# Patient Record
Sex: Female | Born: 1985 | Race: White | Hispanic: No | Marital: Married | Smoking: Former smoker
Health system: Southern US, Community
[De-identification: ages and names within clinical notes are randomized; demographics above are authoritative.]

## PROBLEM LIST (undated history)

## (undated) ENCOUNTER — Inpatient Hospital Stay: Payer: Self-pay

## (undated) DIAGNOSIS — T4145XA Adverse effect of unspecified anesthetic, initial encounter: Secondary | ICD-10-CM

## (undated) DIAGNOSIS — D259 Leiomyoma of uterus, unspecified: Secondary | ICD-10-CM

## (undated) DIAGNOSIS — N200 Calculus of kidney: Secondary | ICD-10-CM

## (undated) DIAGNOSIS — Z8679 Personal history of other diseases of the circulatory system: Secondary | ICD-10-CM

## (undated) DIAGNOSIS — F419 Anxiety disorder, unspecified: Secondary | ICD-10-CM

## (undated) DIAGNOSIS — Z810 Family history of intellectual disabilities: Secondary | ICD-10-CM

## (undated) DIAGNOSIS — Q614 Renal dysplasia: Secondary | ICD-10-CM

## (undated) DIAGNOSIS — T8859XA Other complications of anesthesia, initial encounter: Secondary | ICD-10-CM

## (undated) DIAGNOSIS — I4711 Inappropriate sinus tachycardia, so stated: Secondary | ICD-10-CM

## (undated) DIAGNOSIS — R000 Tachycardia, unspecified: Secondary | ICD-10-CM

## (undated) DIAGNOSIS — Z8349 Family history of other endocrine, nutritional and metabolic diseases: Secondary | ICD-10-CM

## (undated) DIAGNOSIS — I10 Essential (primary) hypertension: Secondary | ICD-10-CM

## (undated) DIAGNOSIS — K5792 Diverticulitis of intestine, part unspecified, without perforation or abscess without bleeding: Secondary | ICD-10-CM

## (undated) DIAGNOSIS — O24419 Gestational diabetes mellitus in pregnancy, unspecified control: Secondary | ICD-10-CM

## (undated) DIAGNOSIS — K5649 Other impaction of intestine: Secondary | ICD-10-CM

## (undated) DIAGNOSIS — J45909 Unspecified asthma, uncomplicated: Secondary | ICD-10-CM

## (undated) DIAGNOSIS — B019 Varicella without complication: Secondary | ICD-10-CM

## (undated) DIAGNOSIS — F53 Postpartum depression: Secondary | ICD-10-CM

## (undated) DIAGNOSIS — K219 Gastro-esophageal reflux disease without esophagitis: Secondary | ICD-10-CM

## (undated) HISTORY — DX: Family history of other endocrine, nutritional and metabolic diseases: Z83.49

## (undated) HISTORY — DX: Renal dysplasia: Q61.4

## (undated) HISTORY — DX: Leiomyoma of uterus, unspecified: D25.9

## (undated) HISTORY — PX: TYMPANOSTOMY TUBE PLACEMENT: SHX32

## (undated) HISTORY — PX: TONSILLECTOMY AND ADENOIDECTOMY: SUR1326

## (undated) HISTORY — DX: Unspecified asthma, uncomplicated: J45.909

## (undated) HISTORY — DX: Inappropriate sinus tachycardia, so stated: I47.11

## (undated) HISTORY — DX: Postpartum depression: F53.0

## (undated) HISTORY — DX: Personal history of other diseases of the circulatory system: Z86.79

## (undated) HISTORY — DX: Calculus of kidney: N20.0

## (undated) HISTORY — PX: APPENDECTOMY: SHX54

## (undated) HISTORY — DX: Tachycardia, unspecified: R00.0

## (undated) HISTORY — PX: OTHER SURGICAL HISTORY: SHX169

## (undated) HISTORY — DX: Other impaction of intestine: K56.49

## (undated) HISTORY — DX: Diverticulitis of intestine, part unspecified, without perforation or abscess without bleeding: K57.92

## (undated) HISTORY — DX: Gastro-esophageal reflux disease without esophagitis: K21.9

## (undated) HISTORY — DX: Gestational diabetes mellitus in pregnancy, unspecified control: O24.419

## (undated) HISTORY — DX: Family history of intellectual disabilities: Z81.0

## (undated) HISTORY — DX: Varicella without complication: B01.9

## (undated) HISTORY — DX: Essential (primary) hypertension: I10

---

## 2015-09-25 HISTORY — PX: GALLBLADDER SURGERY: SHX652

## 2015-10-25 ENCOUNTER — Encounter: Payer: Self-pay | Admitting: Family Medicine

## 2015-10-25 ENCOUNTER — Ambulatory Visit (INDEPENDENT_AMBULATORY_CARE_PROVIDER_SITE_OTHER): Payer: Self-pay | Admitting: Family Medicine

## 2015-10-25 VITALS — BP 118/88 | HR 117 | Temp 98.7°F | Ht 65.75 in | Wt 260.0 lb

## 2015-10-25 DIAGNOSIS — J209 Acute bronchitis, unspecified: Secondary | ICD-10-CM

## 2015-10-25 MED ORDER — ALBUTEROL SULFATE HFA 108 (90 BASE) MCG/ACT IN AERS: 2.0000 | INHALATION_SPRAY | Freq: Four times a day (QID) | RESPIRATORY_TRACT | Status: DC | PRN

## 2015-10-25 MED ORDER — DOXYCYCLINE HYCLATE 100 MG PO TABS: 100.0000 mg | ORAL_TABLET | Freq: Two times a day (BID) | ORAL | Status: DC

## 2015-10-25 MED ORDER — PREDNISONE 50 MG PO TABS: ORAL_TABLET | ORAL | Status: DC

## 2015-10-25 MED ORDER — HYDROCOD POLST-CPM POLST ER 10-8 MG/5ML PO SUER: 5.0000 mL | Freq: Two times a day (BID) | ORAL | Status: DC | PRN

## 2015-10-25 NOTE — Patient Instructions (Signed)
Take the medications as prescribed.  Follow up annually or sooner if needed.  Take care  Dr. Lacinda Axon

## 2015-10-25 NOTE — Assessment & Plan Note (Signed)
New problem. Patient with severe cough. Given history of asthma and duration of illness, treating with prednisone, Doxy, and Tussionex.

## 2015-10-25 NOTE — Progress Notes (Signed)
Pre visit review using our clinic review tool, if applicable. No additional management support is needed unless otherwise documented below in the visit note. 

## 2015-10-25 NOTE — Progress Notes (Signed)
Subjective:  Patient ID: Jane Jackson, female    DOB: March 13, 1986  Age: 30 y.o. MRN: [id number]  CC: Cough, congestion, chest pain  HPI Jane Jackson is a 30 y.o. female presents to the clinic today with the above complaints.  Patient states that she's been sick for the past 2 weeks. She states that it began with typical URI symptoms. It has now progressed to severe cough. Cough is intermittently productive. Cough is severe and unrelenting. She's been using Mucinex and Sudafed with no improvement. She reports associated shortness of breath & chest discomfort with cough. No exacerbating factors. No other complaints today.  PMH, Surgical Hx, Family Hx, Social History reviewed and updated as below.  Past Medical History  Diagnosis Date  . Chicken pox   . Asthma   . Kidney stones    Past Surgical History  Procedure Laterality Date  . Appendectomy    . Tonsillectomy and adenoidectomy      age 33  . Ruptured cyst    . Tympanostomy tube placement     Family History  Problem Relation Age of Onset  . Arthritis Mother   . Ovarian cancer Mother   . Hypertension Mother   . Diabetes Mother   . Alcohol abuse Father   . Arthritis Father   . Hypertension Father   . Arthritis Maternal Grandmother   . Heart disease Maternal Grandmother   . Stroke Maternal Grandmother   . Hypertension Maternal Grandmother   . Diabetes Maternal Grandmother   . Arthritis Maternal Grandfather   . Colon cancer Maternal Grandfather   . Prostate cancer Maternal Grandfather   . Heart disease Maternal Grandfather   . Stroke Maternal Grandfather   . Hypertension Maternal Grandfather   . Arthritis Paternal Grandmother   . Heart disease Paternal Grandmother   . Hypertension Paternal Grandmother   . Diabetes Paternal Grandmother   . Arthritis Paternal Grandfather   . Heart disease Paternal Grandfather   . Hypertension Paternal Grandfather    Social History  Substance Use Topics  . Smoking status: Current Some  Day Smoker  . Smokeless tobacco: Never Used     Comment: Smokes socially.   . Alcohol Use: 0.0 oz/week    0 Standard drinks or equivalent per week     Comment: 2-3 glasses of wine; ~ 2x/month.   Review of Systems  Constitutional: Positive for fever and fatigue.  Respiratory: Positive for cough and shortness of breath.   Cardiovascular: Positive for chest pain.  Musculoskeletal: Positive for myalgias.  Neurological: Positive for weakness and headaches.  All other systems reviewed and are negative.   Objective:   Today's Vitals: BP 118/88 mmHg  Pulse 117  Temp(Src) 98.7 F (37.1 C) (Oral)  Ht 5' 5.75" (1.67 m)  Wt 260 lb (117.935 kg)  BMI 42.29 kg/m2  SpO2 99%  LMP 10/11/2015 (Within Weeks)  Physical Exam  Constitutional: She is oriented to person, place, and time. She appears well-developed. No distress.  HENT:  Head: Normocephalic and atraumatic.  Oropharynx with erythema and post nasal drip. Normal TM's bilaterally.   Eyes: Conjunctivae are normal.  Neck: Neck supple.  Cardiovascular: Regular rhythm.  Tachycardia present.   Pulmonary/Chest: Effort normal and breath sounds normal. No respiratory distress. She has no wheezes. She has no rales.  Musculoskeletal: Normal range of motion. She exhibits no edema.  Lymphadenopathy:    She has no cervical adenopathy.  Neurological: She is alert and oriented to person, place, and time.  Skin: Skin is  warm and dry. No rash noted.  Psychiatric: She has a normal mood and affect.  Vitals reviewed.  Assessment & Plan:   Problem List Items Addressed This Visit    Acute bronchitis - Primary    New problem. Patient with severe cough. Given history of asthma and duration of illness, treating with prednisone, Doxy, and Tussionex.         Outpatient Encounter Prescriptions as of 10/25/2015  Medication Sig  . albuterol (PROVENTIL HFA;VENTOLIN HFA) 108 (90 Base) MCG/ACT inhaler Inhale 2 puffs into the lungs every 6 (six) hours as  needed for wheezing or shortness of breath.  . chlorpheniramine-HYDROcodone (TUSSIONEX PENNKINETIC ER) 10-8 MG/5ML SUER Take 5 mLs by mouth every 12 (twelve) hours as needed.  . doxycycline (VIBRA-TABS) 100 MG tablet Take 1 tablet (100 mg total) by mouth 2 (two) times daily.  . Multiple Vitamins-Minerals (MULTIVITAMIN ADULT PO) Take by mouth.  . predniSONE (DELTASONE) 50 MG tablet 1 tablet daily for 5 days.  . [DISCONTINUED] albuterol (ACCUNEB) 1.25 MG/3ML nebulizer solution Take 1 ampule by nebulization every 6 (six) hours as needed for wheezing.   No facility-administered encounter medications on file as of 10/25/2015.    Follow-up: Annually or sooner if needed.   [place]

## 2016-01-14 DIAGNOSIS — O99211 Obesity complicating pregnancy, first trimester: Secondary | ICD-10-CM | POA: Insufficient documentation

## 2016-01-14 DIAGNOSIS — O99210 Obesity complicating pregnancy, unspecified trimester: Secondary | ICD-10-CM | POA: Insufficient documentation

## 2016-01-14 DIAGNOSIS — Z6841 Body Mass Index (BMI) 40.0 and over, adult: Secondary | ICD-10-CM

## 2016-01-16 ENCOUNTER — Encounter (INDEPENDENT_AMBULATORY_CARE_PROVIDER_SITE_OTHER): Payer: Self-pay

## 2016-01-16 ENCOUNTER — Ambulatory Visit (INDEPENDENT_AMBULATORY_CARE_PROVIDER_SITE_OTHER): Payer: BLUE CROSS/BLUE SHIELD | Admitting: Family Medicine

## 2016-01-16 ENCOUNTER — Encounter: Payer: Self-pay | Admitting: Family Medicine

## 2016-01-16 ENCOUNTER — Telehealth: Payer: Self-pay

## 2016-01-16 DIAGNOSIS — K30 Functional dyspepsia: Secondary | ICD-10-CM

## 2016-01-16 DIAGNOSIS — R1013 Epigastric pain: Secondary | ICD-10-CM | POA: Diagnosis not present

## 2016-01-16 DIAGNOSIS — R1011 Right upper quadrant pain: Secondary | ICD-10-CM | POA: Insufficient documentation

## 2016-01-16 MED ORDER — PROMETHAZINE HCL 25 MG/ML IJ SOLN
25.0000 mg | Freq: Once | INTRAMUSCULAR | Status: AC
  Administered 2016-01-16: 25 mg via INTRAMUSCULAR

## 2016-01-16 MED ORDER — PROMETHAZINE HCL 25 MG PO TABS: 25.0000 mg | ORAL_TABLET | Freq: Three times a day (TID) | ORAL | Status: DC | PRN

## 2016-01-16 MED ORDER — KETOROLAC TROMETHAMINE 60 MG/2ML IM SOLN
60.0000 mg | Freq: Once | INTRAMUSCULAR | Status: AC
  Administered 2016-01-16: 60 mg via INTRAMUSCULAR

## 2016-01-16 MED ORDER — PROMETHAZINE HCL 25 MG/ML IJ SOLN: 25.0000 mg | Freq: Once | INTRAMUSCULAR | Status: DC

## 2016-01-16 MED ORDER — OXYCODONE-ACETAMINOPHEN 5-325 MG PO TABS: 1.0000 | ORAL_TABLET | Freq: Three times a day (TID) | ORAL | Status: DC | PRN

## 2016-01-16 NOTE — Progress Notes (Signed)
Subjective:  Patient ID: Jane Jackson, female    DOB: 1986/02/19  Age: 30 y.o. MRN: [id number]  CC: Abdominal pain, impaction  HPI:  30 year-old female presents with the above complaints.  Patient was recently admitted and [facility] from 4/3 - 4/21 regarding this. Hospital course/discharge summary was reviewed and is summarized as below: Patient presented with epigastric/right upper quadrant pain. Ultrasound was obtained and revealed gallbladder sludge. No evidence of cholecystitis. Suspect CT revealed mild stranding today; concerning for diverticulitis. She was treated with antibiotics. She continued to have significant pain. Subsequent abdominal x-ray showed a large amount of stool burden. Patient continued to have pain. MRCP was obtained and revealed no acute abnormality is. GI saw the patient during hospitalization. EGD was done and was unremarkable. After significant workup and imaging, was thought that her pain was functional in nature. There is no evidence of obstruction during her admission. She was given enema and oral medications to alleviate the stool burden with no improvement. Patient declined colonoscopy and further bowel regimen/cleanout as increased/continued narcotics would not be given. After dissatisfaction with care she requests leave the hospital and she was discharged. During auscultation, she also had cephalic vein clots due to IV access.  After being discharged from [facility], she continued to feel poorly and went to [facility] for evaluation. CT was obtained and revealed resolving colitis. Per the chart, she was tolerating food and medications and was well-appearing. Labs were normal. She was instructed to follow-up with GI and was discharged in stable condition.  Patient presents today complaining of continued severe right upper quadrant and epigastric pain. States that she's not passing gas. She has had some loose stool. She reports significant nausea and vomiting and states that she is  unable to keep anything down. She reports associated weakness and states that she is almost passed out a few times. No known exacerbating or relieving factors. Recent fever or chills.  Social Hx   Social History   Social History  . Marital Status: Married    Spouse Name: N/A  . Number of Children: N/A  . Years of Education: N/A   Social History Main Topics  . Smoking status: Current Some Day Smoker  . Smokeless tobacco: Never Used     Comment: Smokes socially.   . Alcohol Use: 0.0 oz/week    0 Standard drinks or equivalent per week     Comment: 2-3 glasses of wine; ~ 2x/month.  . Drug Use: No  . Sexual Activity: Yes   Other Topics Concern  . None   Social History Narrative   Review of Systems  Constitutional: Positive for fatigue.  Gastrointestinal: Positive for nausea, vomiting and constipation.  Neurological: Positive for weakness.   Objective:  BP 118/62 mmHg  Pulse 107  Temp(Src) 97.7 F (36.5 C) (Oral)  Ht 5' 5.75" (1.67 m)  Wt 139 lb 4 oz (63.163 kg)  BMI 22.65 kg/m2  SpO2 97%  BP/Weight 01/16/2016 123XX123  Systolic BP 123456 123456  Diastolic BP 62 88  Wt. (Lbs) 139.25 260  BMI 22.65 42.29   Physical Exam  Constitutional:  Patient is in wheelchair due to weakness. Appears in pain.  Eyes: Conjunctivae are normal. No scleral icterus.  Cardiovascular: Regular rhythm.   Pulmonary/Chest: Effort normal.  Abdominal: Soft.  Tender diffusely but more predominantly in the right upper quadrant and epigastric region. No bowel sounds were appreciated.   Neurological: She is alert.  Psychiatric:  Flat affect, depressed mood.  Vitals reviewed.  Assessment & Plan:   Problem List Items Addressed This Visit    Abdominal pain, RUQ    New problem.  Hospitalization, and recent ED visit reviewed and summarized in history of present illness. Recent labs from ED visit on 4/22 reviewed. Patient with likely functional abdominal pain (after review of the medical  records and workup). Complicated by significant opiate use in the hospital and subsequent constipation. Given reports of nausea, vomiting, and inability to keep things down I discussed going back to the hospital. This is limited in a difficult decision as she has recently been admitted at [facility] and was seen and discharged from the [facility] ED on 4/22. I was able to arrange her to see a GI physician tomorrow.  Prescribed Phenergan for her nausea and encouraged aggressive hydration. Toradol was given for pain today. Patient requested opiates today. I reluctantly gave her prescription for Percocet after informing her of the risk especially in the setting of her current bowel issues. She was aware of the risk. I will not be providing additional narcotic medication. Advise her symptoms and other testicular hospital if she worsens.      Relevant Medications   promethazine (PHENERGAN) injection 25 mg (Completed)   ketorolac (TORADOL) injection 60 mg (Completed)   Abdominal pain, epigastric   Relevant Medications   promethazine (PHENERGAN) injection 25 mg (Completed)   ketorolac (TORADOL) injection 60 mg (Completed)      Meds ordered this encounter  Medications  . promethazine (PHENERGAN) injection 25 mg    Sig:   . ketorolac (TORADOL) injection 60 mg    Sig:   . oxyCODONE-acetaminophen (ROXICET) 5-325 MG tablet    Sig: Take 1 tablet by mouth every 8 (eight) hours as needed for severe pain.    Dispense:  20 tablet    Refill:  0  . promethazine (PHENERGAN) 25 MG tablet    Sig: Take 1 tablet (25 mg total) by mouth every 8 (eight) hours as needed for nausea or vomiting.    Dispense:  20 tablet    Refill:  0    Follow-up: Following GI eval.  [place]

## 2016-01-16 NOTE — Assessment & Plan Note (Signed)
New problem.  Hospitalization, and recent ED visit reviewed and summarized in history of present illness. Recent labs from ED visit on 4/22 reviewed. Patient with likely functional abdominal pain (after review of the medical records and workup). Complicated by significant opiate use in the hospital and subsequent constipation. Given reports of nausea, vomiting, and inability to keep things down I discussed going back to the hospital. This is limited in a difficult decision as she has recently been admitted at [facility] and was seen and discharged from the [facility] ED on 4/22. I was able to arrange her to see a GI physician tomorrow.  Prescribed Phenergan for her nausea and encouraged aggressive hydration. Toradol was given for pain today. Patient requested opiates today. I reluctantly gave her prescription for Percocet after informing her of the risk especially in the setting of her current bowel issues. She was aware of the risk. I will not be providing additional narcotic medication. Advise her symptoms and other testicular hospital if she worsens.

## 2016-01-16 NOTE — Patient Instructions (Signed)
If you worsen, go to the [facility] ED.  Be sure to go to your GI appt.  Hydrate, hydrate, hydrate.  Follow up to be scheduled after GI appt.  Take care  Dr. Lacinda Axon

## 2016-01-16 NOTE — Progress Notes (Signed)
Pre visit review using our clinic review tool, if applicable. No additional management support is needed unless otherwise documented below in the visit note. 

## 2016-01-17 ENCOUNTER — Telehealth: Payer: Self-pay | Admitting: Internal Medicine

## 2016-01-17 ENCOUNTER — Ambulatory Visit (INDEPENDENT_AMBULATORY_CARE_PROVIDER_SITE_OTHER): Payer: BLUE CROSS/BLUE SHIELD | Admitting: Gastroenterology

## 2016-01-17 ENCOUNTER — Observation Stay (HOSPITAL_COMMUNITY)
Admission: AD | Admit: 2016-01-17 | Discharge: 2016-01-19 | Disposition: A | Discharge status: Home / Self Care | Payer: BLUE CROSS/BLUE SHIELD | Source: Ambulatory Visit | Attending: Internal Medicine | Admitting: Internal Medicine

## 2016-01-17 ENCOUNTER — Encounter (HOSPITAL_COMMUNITY): Payer: Self-pay | Admitting: *Deleted

## 2016-01-17 ENCOUNTER — Encounter: Payer: Self-pay | Admitting: Gastroenterology

## 2016-01-17 VITALS — BP 110/80 | HR 102 | Ht 65.75 in | Wt 253.0 lb

## 2016-01-17 DIAGNOSIS — F17210 Nicotine dependence, cigarettes, uncomplicated: Secondary | ICD-10-CM | POA: Insufficient documentation

## 2016-01-17 DIAGNOSIS — I808 Phlebitis and thrombophlebitis of other sites: Secondary | ICD-10-CM | POA: Diagnosis not present

## 2016-01-17 DIAGNOSIS — Y848 Other medical procedures as the cause of abnormal reaction of the patient, or of later complication, without mention of misadventure at the time of the procedure: Secondary | ICD-10-CM | POA: Diagnosis not present

## 2016-01-17 DIAGNOSIS — E860 Dehydration: Secondary | ICD-10-CM | POA: Diagnosis not present

## 2016-01-17 DIAGNOSIS — J4520 Mild intermittent asthma, uncomplicated: Secondary | ICD-10-CM | POA: Diagnosis not present

## 2016-01-17 DIAGNOSIS — Z6841 Body Mass Index (BMI) 40.0 and over, adult: Secondary | ICD-10-CM | POA: Diagnosis not present

## 2016-01-17 DIAGNOSIS — Z9049 Acquired absence of other specified parts of digestive tract: Secondary | ICD-10-CM | POA: Insufficient documentation

## 2016-01-17 DIAGNOSIS — R1011 Right upper quadrant pain: Secondary | ICD-10-CM

## 2016-01-17 DIAGNOSIS — K811 Chronic cholecystitis: Principal | ICD-10-CM | POA: Insufficient documentation

## 2016-01-17 DIAGNOSIS — R1115 Cyclical vomiting syndrome unrelated to migraine: Secondary | ICD-10-CM

## 2016-01-17 DIAGNOSIS — G43A1 Cyclical vomiting, intractable: Secondary | ICD-10-CM | POA: Diagnosis not present

## 2016-01-17 DIAGNOSIS — R112 Nausea with vomiting, unspecified: Secondary | ICD-10-CM | POA: Diagnosis not present

## 2016-01-17 DIAGNOSIS — T8172XA Complication of vein following a procedure, not elsewhere classified, initial encounter: Secondary | ICD-10-CM | POA: Diagnosis not present

## 2016-01-17 DIAGNOSIS — J45909 Unspecified asthma, uncomplicated: Secondary | ICD-10-CM | POA: Diagnosis not present

## 2016-01-17 DIAGNOSIS — Z419 Encounter for procedure for purposes other than remedying health state, unspecified: Secondary | ICD-10-CM

## 2016-01-17 HISTORY — DX: Other complications of anesthesia, initial encounter: T88.59XA

## 2016-01-17 HISTORY — DX: Adverse effect of unspecified anesthetic, initial encounter: T41.45XA

## 2016-01-17 HISTORY — DX: Anxiety disorder, unspecified: F41.9

## 2016-01-17 LAB — COMPREHENSIVE METABOLIC PANEL
ALT: 24 U/L (ref 14–54)
AST: 21 U/L (ref 15–41)
Albumin: 4 g/dL (ref 3.5–5.0)
Alkaline Phosphatase: 45 U/L (ref 38–126)
Anion gap: 9 (ref 5–15)
BUN: 12 mg/dL (ref 6–20)
CALCIUM: 9.3 mg/dL (ref 8.9–10.3)
CO2: 25 mmol/L (ref 22–32)
CREATININE: 0.89 mg/dL (ref 0.44–1.00)
Chloride: 106 mmol/L (ref 101–111)
GFR calc non Af Amer: >60 mL/min (ref >60)
Glucose, Bld: 88 mg/dL (ref 65–99)
Potassium: 4.1 mmol/L (ref 3.5–5.1)
SODIUM: 140 mmol/L (ref 135–145)
Total Bilirubin: 0.4 mg/dL (ref 0.3–1.2)
Total Protein: 6.6 g/dL (ref 6.5–8.1)

## 2016-01-17 LAB — URINALYSIS, ROUTINE W REFLEX MICROSCOPIC
BILIRUBIN URINE: NEGATIVE
GLUCOSE, UA: NEGATIVE mg/dL
HGB URINE DIPSTICK: NEGATIVE
KETONES UR: NEGATIVE mg/dL
Leukocytes, UA: NEGATIVE
Nitrite: NEGATIVE
PROTEIN: NEGATIVE mg/dL
Specific Gravity, Urine: 1.021 (ref 1.005–1.030)
pH: 5.5 (ref 5.0–8.0)

## 2016-01-17 LAB — CBC
HCT: 40.2 % (ref 36.0–46.0)
Hemoglobin: 13.7 g/dL (ref 12.0–15.0)
MCH: 31.9 pg (ref 26.0–34.0)
MCHC: 34.1 g/dL (ref 30.0–36.0)
MCV: 93.5 fL (ref 78.0–100.0)
PLATELETS: 266 10*3/uL (ref 150–400)
RBC: 4.3 MIL/uL (ref 3.87–5.11)
RDW: 12.6 % (ref 11.5–15.5)
WBC: 10.5 10*3/uL (ref 4.0–10.5)

## 2016-01-17 MED ORDER — KETOROLAC TROMETHAMINE 30 MG/ML IJ SOLN
30.0000 mg | Freq: Four times a day (QID) | INTRAMUSCULAR | Status: DC | PRN
  Administered 2016-01-17 – 2016-01-19 (×6): 30 mg via INTRAVENOUS
  Filled 2016-01-17 (×5): qty 1

## 2016-01-17 MED ORDER — ACETAMINOPHEN 650 MG RE SUPP: 650.0000 mg | Freq: Four times a day (QID) | RECTAL | Status: DC | PRN

## 2016-01-17 MED ORDER — ENOXAPARIN SODIUM 40 MG/0.4ML ~~LOC~~ SOLN
40.0000 mg | SUBCUTANEOUS | Status: DC
Start: 2016-01-17 — End: 2016-01-19
  Administered 2016-01-17 – 2016-01-18 (×2): 40 mg via SUBCUTANEOUS
  Filled 2016-01-17 (×2): qty 0.4

## 2016-01-17 MED ORDER — SODIUM CHLORIDE 0.9 % IV SOLN
INTRAVENOUS | Status: DC
  Administered 2016-01-17 – 2016-01-18 (×3): via INTRAVENOUS

## 2016-01-17 MED ORDER — ACETAMINOPHEN 325 MG PO TABS: 650.0000 mg | ORAL_TABLET | Freq: Four times a day (QID) | ORAL | Status: DC | PRN

## 2016-01-17 MED ORDER — ONDANSETRON HCL 4 MG PO TABS
4.0000 mg | ORAL_TABLET | Freq: Four times a day (QID) | ORAL | Status: DC | PRN
  Administered 2016-01-17: 4 mg via ORAL
  Filled 2016-01-17: qty 1

## 2016-01-17 MED ORDER — ALBUTEROL SULFATE (2.5 MG/3ML) 0.083% IN NEBU
3.0000 mL | INHALATION_SOLUTION | Freq: Four times a day (QID) | RESPIRATORY_TRACT | Status: DC | PRN
  Administered 2016-01-18 – 2016-01-19 (×2): 3 mL via RESPIRATORY_TRACT
  Filled 2016-01-17 (×2): qty 3

## 2016-01-17 MED ORDER — ONDANSETRON HCL 4 MG/2ML IJ SOLN: 4.0000 mg | Freq: Four times a day (QID) | INTRAMUSCULAR | Status: DC | PRN

## 2016-01-17 MED ORDER — MORPHINE SULFATE (PF) 2 MG/ML IV SOLN
1.0000 mg | INTRAVENOUS | Status: DC | PRN
  Administered 2016-01-17 – 2016-01-18 (×3): 1 mg via INTRAVENOUS
  Filled 2016-01-17 (×3): qty 1

## 2016-01-17 MED ORDER — HYDROCODONE-ACETAMINOPHEN 5-325 MG PO TABS
1.0000 | ORAL_TABLET | ORAL | Status: DC | PRN
  Administered 2016-01-17 – 2016-01-19 (×9): 2 via ORAL
  Filled 2016-01-17 (×9): qty 2

## 2016-01-17 MED ORDER — ONDANSETRON HCL 4 MG/2ML IJ SOLN
4.0000 mg | Freq: Four times a day (QID) | INTRAMUSCULAR | Status: DC | PRN
  Administered 2016-01-18 – 2016-01-19 (×5): 4 mg via INTRAVENOUS
  Filled 2016-01-17 (×5): qty 2

## 2016-01-17 NOTE — Consult Note (Signed)
Reason for Consult:RUQ pain Referring Physician: [name] is an 30 y.o. female.  HPI: 30 yo female who began having RUQ on 4/3. She went to [facility] and was treated for 2 weeks for diverticulitis vs constipation which resulted in no improvement of her pain. She also underwent upper endoscopy showing no abnormality. The only positive finding on imaging at this facility appears to have been sludge vs stones in the gallbladder and stranding around the sigmoid colon. She also notes undergoing NG tube placement for miralax regimen and being without an IV for multiple days from thrombophlebitis, which was confirmed from [facility] progress notes.  She notes having similar intermittent pain prior to 4/3 intermittently, especially after eating "foods she probably shouldn't have eaten". Currently, she is vomiting within 5 minutes of trying to eat anything, of note the CT from 2 days ago shows no evidence of ileus or obstruction to warrant an obstructive protocol. She also notes 15 pounds weight loss in the past month.  She smokes less than a pack every two weeks, was treated empirically for ulcer disease and also underwent negative endoscopy.  Past Medical History  Diagnosis Date  . Chicken pox   . Asthma   . Kidney stones   . Diverticulitis   . Impaction, bowel ([place])   . Anxiety   . Complication of anesthesia     issue with asthma and intubation    Past Surgical History  Procedure Laterality Date  . Appendectomy    . Tonsillectomy and adenoidectomy      age 73  . Ruptured cyst    . Tympanostomy tube placement      Family History  Problem Relation Age of Onset  . Arthritis Mother   . Ovarian cancer Mother   . Hypertension Mother   . Diabetes Mother   . Alcohol abuse Father   . Arthritis Father   . Hypertension Father   . Arthritis Maternal Grandmother   . Heart disease Maternal Grandmother   . Stroke Maternal Grandmother   . Hypertension Maternal Grandmother   . Diabetes  Maternal Grandmother   . Arthritis Maternal Grandfather   . Colon cancer Maternal Grandfather   . Prostate cancer Maternal Grandfather   . Heart disease Maternal Grandfather   . Stroke Maternal Grandfather   . Hypertension Maternal Grandfather   . Arthritis Paternal Grandmother   . Heart disease Paternal Grandmother   . Hypertension Paternal Grandmother   . Diabetes Paternal Grandmother   . Arthritis Paternal Grandfather   . Heart disease Paternal Grandfather   . Hypertension Paternal Grandfather     Social History:  reports that she has been smoking.  She has never used smokeless tobacco. She reports that she drinks alcohol. She reports that she does not use illicit drugs.  Allergies:  Allergies  Allergen Reactions  . Latex Itching and Rash    Medications: I have reviewed the patient's current medications.  Results for orders placed or performed during the hospital encounter of 01/17/16 (from the past 48 hour(s))  Comprehensive metabolic panel     Status: None   Collection Time: 01/17/16  6:09 PM  Result Value Ref Range   Sodium 140 135 - 145 mmol/L   Potassium 4.1 3.5 - 5.1 mmol/L   Chloride 106 101 - 111 mmol/L   CO2 25 22 - 32 mmol/L   Glucose, Bld 88 65 - 99 mg/dL   BUN 12 6 - 20 mg/dL   Creatinine, Ser 0.89 0.44 -  1.00 mg/dL   Calcium 9.3 8.9 - 40.6 mg/dL   Total Protein 6.6 6.5 - 8.1 g/dL   Albumin 4.0 3.5 - 5.0 g/dL   AST 21 15 - 41 U/L   ALT 24 14 - 54 U/L   Alkaline Phosphatase 45 38 - 126 U/L   Total Bilirubin 0.4 0.3 - 1.2 mg/dL   GFR calc non Af Amer >60 >60 mL/min   GFR calc Af Amer >60 >60 mL/min    Comment: (NOTE) The eGFR has been calculated using the CKD EPI equation. This calculation has not been validated in all clinical situations. eGFR's persistently <60 mL/min signify possible Chronic Kidney Disease.    Anion gap 9 5 - 15  CBC     Status: None   Collection Time: 01/17/16  6:09 PM  Result Value Ref Range   WBC 10.5 4.0 - 10.5 K/uL   RBC  4.30 3.87 - 5.11 MIL/uL   Hemoglobin 13.7 12.0 - 15.0 g/dL   HCT 42.9 42.4 - 99.8 %   MCV 93.5 78.0 - 100.0 fL   MCH 31.9 26.0 - 34.0 pg   MCHC 34.1 30.0 - 36.0 g/dL   RDW 48.9 24.4 - 97.4 %   Platelets 266 150 - 400 K/uL  Urinalysis, Routine w reflex microscopic (not at [facility])     Status: Abnormal   Collection Time: 01/17/16  9:05 PM  Result Value Ref Range   Color, Urine YELLOW YELLOW   APPearance CLOUDY (A) CLEAR   Specific Gravity, Urine 1.021 1.005 - 1.030   pH 5.5 5.0 - 8.0   Glucose, UA NEGATIVE NEGATIVE mg/dL   Hgb urine dipstick NEGATIVE NEGATIVE   Bilirubin Urine NEGATIVE NEGATIVE   Ketones, ur NEGATIVE NEGATIVE mg/dL   Protein, ur NEGATIVE NEGATIVE mg/dL   Nitrite NEGATIVE NEGATIVE   Leukocytes, UA NEGATIVE NEGATIVE    Comment: MICROSCOPIC NOT DONE ON URINES WITH NEGATIVE PROTEIN, BLOOD, LEUKOCYTES, NITRITE, OR GLUCOSE <1000 mg/dL.    No results found.  Review of Systems  Constitutional: Positive for weight loss. Negative for fever and chills.  HENT: Negative for hearing loss.   Eyes: Negative for blurred vision and double vision.  Respiratory: Negative for cough and hemoptysis.   Cardiovascular: Negative for chest pain and palpitations.  Gastrointestinal: Positive for nausea, vomiting and abdominal pain.  Genitourinary: Negative for dysuria and urgency.  Musculoskeletal: Negative for myalgias and neck pain.  Skin: Negative for itching and rash.  Neurological: Negative for dizziness, tingling and headaches.  Endo/Heme/Allergies: Does not bruise/bleed easily.  Psychiatric/Behavioral: Negative for depression and suicidal ideas.   Blood pressure 110/57, pulse 71, temperature 98.3 F (36.8 C), temperature source Oral, resp. rate 17, height 5\' 4"  (1.626 m), weight 114.76 kg (253 lb), SpO2 98 %. Physical Exam  Vitals reviewed. Constitutional: She is oriented to person, place, and time. She appears well-developed and well-nourished.  HENT:  Head: Normocephalic and  atraumatic.  Eyes: Conjunctivae and EOM are normal. Pupils are equal, round, and reactive to light.  Neck: Normal range of motion. Neck supple.  Cardiovascular: Normal rate and regular rhythm.   Respiratory: Effort normal and breath sounds normal.  GI: Soft. Bowel sounds are normal. She exhibits no distension. There is tenderness. There is no guarding.  Musculoskeletal: Normal range of motion.  Neurological: She is alert and oriented to person, place, and time.  Skin: Skin is warm and dry.  Psychiatric: She has a normal mood and affect. Her behavior is normal.    Assessment/Plan:  30 yo female with [facility]. Given symptoms, localization to right subcostal area, and sludge on multiple imaging studies it seems reasonable to me to proceed with gallbladder removal. I do agree with Dr. Loletha Carrow that laparoscopy at time of gallbladder removal may lead to additional findings and given the sensitivity of HIDA %EF, I am not sure a negative study would alter the course of treatment. I think her intermittent symptoms prior to April also lead towards biliary disease.   I am most concerned that the prolonged hospitalization may have complicated the situation and led to some opiate tolerance based on the varied scheduled from notes (5-10 oxycodone to 2 dilaudid q4h scheduled for multiple days) -will talk with day team on proceeding with surgery vs nuclear medicine study  Jane Jackson 01/17/2016, 10:24 PM

## 2016-01-17 NOTE — H&P (Addendum)
History and Physical    Blessing Askar [id number] DOB: 06-29-1986 DOA: 01/17/2016  Referring Provider: Dr Loletha Carrow PCP: Coral Spikes, DO  Outpatient Specialists:   Patient coming from: home- Dr Corena Pilgrim office  Chief Complaint: abdominal pain  HPI: Jane Jackson is a 30 y.o. female with PMH of morbid obesity, asthma, anxiety, hemorrhoids and anal fissure recent admission and [facility] for abdominal pain from 4/3- 4/21. Per records, she was treated for diverticulitis for 7 days. Subsequently was treated for severe constipation with NG tube and enemas. Ct did show gallstones but no cholecystitis. MRCP and EGD were unrevealing. She was discharged on 4/21.  She was seen at [facility] at 4/22 and had a CT of the abdomen and pelvis which showed possible mild colitis. She seen by her her PCP was urgently referred to [place] and was seen today by Dr Loletha Carrow with GI. She stated that she had not urinated all day and had been vomiting and was therefore referred for a direct admit. He requested a surgical eval  Had a tiny BM which was watery yesterday. Has not eaten or drank since yesterday.  Took 2 tabs of Oxycodone yesterday but none today.   ED Course: direct admit  Review of Systems:  Weight loss 15 lb in 1 month HR was up in the 120s when "this first started" . No recent palpitations. All other systems reviewed and apart from HPI, are negative.  Past Medical History  Diagnosis Date  . Chicken pox   . Asthma   . Kidney stones   . Diverticulitis   . Impaction, bowel ([place])   . Anxiety   . Complication of anesthesia     issue with asthma and intubation    Past Surgical History  Procedure Laterality Date  . Appendectomy    . Tonsillectomy and adenoidectomy      age 23  . Ruptured cyst    . Tympanostomy tube placement      Last smoked yesterday- smokes occasionally.  She reports that she drinks alcohol occasionally- last drank about 2 months ago. She was a full time Ship broker. She reports that she  does not use illicit drugs.  Allergies  Allergen Reactions  . Latex Itching and Rash    Family History  Problem Relation Age of Onset  . Arthritis Mother   . Ovarian cancer Mother   . Hypertension Mother   . Diabetes Mother   . Alcohol abuse Father   . Arthritis Father   . Hypertension Father   . Arthritis Maternal Grandmother   . Heart disease Maternal Grandmother   . Stroke Maternal Grandmother   . Hypertension Maternal Grandmother   . Diabetes Maternal Grandmother   . Arthritis Maternal Grandfather   . Colon cancer Maternal Grandfather   . Prostate cancer Maternal Grandfather   . Heart disease Maternal Grandfather   . Stroke Maternal Grandfather   . Hypertension Maternal Grandfather   . Arthritis Paternal Grandmother   . Heart disease Paternal Grandmother   . Hypertension Paternal Grandmother   . Diabetes Paternal Grandmother   . Arthritis Paternal Grandfather   . Heart disease Paternal Grandfather   . Hypertension Paternal Grandfather      Prior to Admission medications   Medication Sig Start Date End Date Taking? Authorizing Provider  albuterol (PROVENTIL HFA;VENTOLIN HFA) 108 (90 Base) MCG/ACT inhaler Inhale 2 puffs into the lungs every 6 (six) hours as needed for wheezing or shortness of breath. 10/25/15   Coral Spikes, DO  Multiple Vitamins-Minerals (MULTIVITAMIN ADULT PO) Take by mouth. Reported on 01/16/2016    Historical Provider, MD  oxyCODONE-acetaminophen (ROXICET) 5-325 MG tablet Take 1 tablet by mouth every 8 (eight) hours as needed for severe pain. 01/16/16   Coral Spikes, DO  promethazine (PHENERGAN) 25 MG tablet Take 1 tablet (25 mg total) by mouth every 8 (eight) hours as needed for nausea or vomiting. 01/16/16   Coral Spikes, DO    Physical Exam: There were no vitals filed for this visit.    Constitutional: NAD, calm, comfortable Eyes: PERTLA, lids and conjunctivae normal ENMT: Mucous membranes are moist. Posterior pharynx clear of any exudate or  lesions. Normal dentition.  Neck: normal, supple, no masses, no thyromegaly Respiratory: clear to auscultation bilaterally, no wheezing, no crackles. Normal respiratory effort. No accessory muscle use.  Cardiovascular: S1 & S2 heard, regular rate and rhythm, no murmurs / rubs / gallops. No extremity edema. 2+ pedal pulses. No carotid bruits.  Abdomen: mild distension, tender in epigastrium and RUQ no masses palpated. No hepatosplenomegaly. Bowel sounds normal.  Musculoskeletal: no clubbing / cyanosis. No joint deformity upper and lower extremities. Good ROM, no contractures. Normal muscle tone.  Skin: no rashes, lesions, ulcers. No induration Neurologic: CN 2-12 grossly intact. Sensation intact, DTR normal. Strength 5/5 in all 4 limbs.  Psychiatric: Normal judgment and insight. Alert and oriented x 3. Normal mood.     Labs on Admission: I have personally reviewed following labs and imaging studies  CBC: No results for input(s): WBC, NEUTROABS, HGB, HCT, MCV, PLT in the last 168 hours. Basic Metabolic Panel: No results for input(s): NA, K, CL, CO2, GLUCOSE, BUN, CREATININE, CALCIUM, MG, PHOS in the last 168 hours. GFR: CrCl cannot be calculated (Patient has no serum creatinine result on file.). Liver Function Tests: No results for input(s): AST, ALT, ALKPHOS, BILITOT, PROT, ALBUMIN in the last 168 hours. No results for input(s): LIPASE, AMYLASE in the last 168 hours. No results for input(s): AMMONIA in the last 168 hours. Coagulation Profile: No results for input(s): INR, PROTIME in the last 168 hours. Cardiac Enzymes: No results for input(s): CKTOTAL, CKMB, CKMBINDEX, TROPONINI in the last 168 hours. BNP (last 3 results) No results for input(s): PROBNP in the last 8760 hours. HbA1C: No results for input(s): HGBA1C in the last 72 hours. CBG: No results for input(s): GLUCAP in the last 168 hours. Lipid Profile: No results for input(s): CHOL, HDL, LDLCALC, TRIG, CHOLHDL, LDLDIRECT in  the last 72 hours. Thyroid Function Tests: No results for input(s): TSH, T4TOTAL, FREET4, T3FREE, THYROIDAB in the last 72 hours. Anemia Panel: No results for input(s): VITAMINB12, FOLATE, FERRITIN, TIBC, IRON, RETICCTPCT in the last 72 hours. Urine analysis: No results found for: COLORURINE, APPEARANCEUR, LABSPEC, PHURINE, GLUCOSEU, HGBUR, BILIRUBINUR, KETONESUR, PROTEINUR, UROBILINOGEN, NITRITE, LEUKOCYTESUR Sepsis Labs: @LABRCNTIP (procalcitonin:4,lacticidven:4) )No results found for this or any previous visit (from the past 240 hour(s)).   Radiological Exams on Admission: No results found.   Assessment/Plan Active Problems:   RUQ pain  - treated for diverticulitis at [facility]- then subsequently had imaging showing severe constipation -MRCP and EGD at [facility] unrevealing- NG tube placed and enemas ordered- she eventually decided to leave as she was not improving- discharged on 4/21-  Went to [place] next day- CT at [facility] on 4/22 showed improving (possible) mild colitis - she does have cholelithiasis noted on CT at [facility]- may need HIDA- have asked for surgical opinion  - clear liquids ordered, zofran for nausea  Narcotic seeking? -  notes from [facility] and ER note from [place] state she was requesting narcotics for her pain- no narcotic prescription was given at [facility] but her PCP wrote one for her  - have recommended Toradol and also ordered PRN Vicodin (only one tab) and only 1 mg of PRN Morphine for now and have dicussed this with her  Poor PO intake/ dehydration? - check Bmet- start IVF- monitor I and O-   Thrombophlebitis - right arm from IV  Asthma - PRN Albuterol  DVT prophylaxis: SCDs for now Code Status: full code Family Communication: husband  Disposition Plan: cannot determine length of hospital stay  Consults called: GI, gen surgery  Admission status: observation    [facility] MD [facility] Pager: [email or web address] Password [email or web address] 7PM-7AM, please contact  night-coverage   01/17/2016, 5:36 PM

## 2016-01-17 NOTE — Patient Instructions (Signed)
We are sending you to [facility] to be Admitted for further evaluation and treatment.  Thank you for choosing [facility]  Dr Wilfrid Lund III

## 2016-01-17 NOTE — Telephone Encounter (Signed)
Created in error. T.Bambi Fehnel, CMA

## 2016-01-17 NOTE — Progress Notes (Signed)
[facility] Consult Note:  History: Jane Jackson 01/17/2016  Referring physician: Coral Spikes, DO  Reason for consult/chief complaint: Abdominal Pain; RUQ pain; Nausea; Emesis; and stool impaction   Subjective HPI:   Reviewed 4/24 PCP note - recent hosp stay at [facility] and [place] for RUQ pain - no source on extensive workup.  [facility] discharge summary as follows: "Epigastric/RUQ pain: unclear etiology. Initial RUQ ultrasound to evaluate for biliary pathology showed gallbladder sludge and no gallstones or evidence of acute cholecystitis. CT A/P on 4/3 was notable for mild stranding around a diverticulum located in the ascending colon, potentially representing mild acute diverticulitis and non-obstructing renal calculus. Patient was started on antibiotics for treatment of diverticulitis but continued to be symptomatic. An abdominal X-ray on 4/6 showed a large stool burden. A repeat CT A/P on 4/6 showed resolution of fat stranding adjacent to the ascending colon and cholelithiasis. Has completed a course of levofloxacin and metronidazole. MRCP on 4/8 showed no acute abnormalities. GI consulted, appreciate their input. EGD on 4/11 without gross pathology. Suspect largely functional etiology to degree of ongoing symptoms, perhaps visceral hypersensitivity in setting of unclear initial insult. Repeat KUB 4/14 with continued large stool burden, no obstruction. reconsulted GI 4/15 given no resolution in symptoms; appreciate their recommendations. gave SMOG enema, placed cor-pak and started golytely prep to clean out. Repeat labs wnl. No evidence of dehydration. Repeat KUB 4/18 with persistent colonic stool burden. No evidence of obstruction. Pt has been resistant to further bowel clean out without increased doses of narcotics despite our best efforts to educate pt on the risks and benefits of using narcotic pain medications. Dr. Radene Knee and NP Marin Shutter had spent a lot of time with patient and had  extensive conversations with the patient. Today, they offered pt colonoscopy Monday. Again, this would require pt to have bowel prep. Mirtazipine not tolerated due to side effects; started buspirone instead and had similar symptoms, so titrated down to 5mg  BID. Continue dilaudid 2 mg q 4 hours and zofran PRN. Started cymbalta 30 mg daily for functional abdominal pain. Bowel cleanout was attempted with golytely and enemas, although for the most part the patient refused. Tonight, the patient was adamant that she wanted to leave the hospital. Given likely functional component of the patient's abdominal pain, I felt that she was stable for discharge. She was given strict return precautions. I told her that I am not willing to prescribe narcotics at the time of discharge. The medical team discussed the problem with increasing narcotic dosages in her; despite this, she persistent in refusing therapies for her constipation until we increased pain medications which we would not because of their consitipating effect and long-term side effects. She was offered other non-narcotic pain modalities but refused these. Her care was challenging and we made all attempts to outline our rationale."  On 01/14/2016 she was in the ED at [facility], at which time a CT scan was done to appears to be normal, she was discharged home.  This patient was sent by her PCP for an urgent office visit regarding the symptoms noted above. She describes the acute onset of epigastric to right upper quadrant pain on 12/26/2015, and had no antecedent GI symptoms. The hospital course is as described above. At this point, she continues to have constant right upper quadrant pain that is not changed with movement, it is perhaps a little worse with deep breath, it is worse with meals and she says that today she  has kept down no food, only a little liquid and has not urinated all day.   ROS:  Review of Systems  Constitutional:  Positive for unexpected weight change. Negative for appetite change.  HENT: Negative for mouth sores and voice change.   Eyes: Negative for pain and redness.  Respiratory: Negative for cough and shortness of breath.   Cardiovascular: Negative for chest pain and palpitations.  Genitourinary: Negative for dysuria and hematuria.  Musculoskeletal: Negative for myalgias and arthralgias.  Skin: Negative for pallor and rash.  Neurological: Negative for weakness and headaches.  Hematological: Negative for adenopathy.     Past Medical History: Past Medical History  Diagnosis Date  . Chicken pox   . Asthma   . Kidney stones   . Diverticulitis   . Impaction, bowel [facility])      Past Surgical History: Past Surgical History  Procedure Laterality Date  . Appendectomy    . Tonsillectomy and adenoidectomy      age 30  . Ruptured cyst    . Tympanostomy tube placement       Family History: Family History  Problem Relation Age of Onset  . Arthritis Mother   . Ovarian cancer Mother   . Hypertension Mother   . Diabetes Mother   . Alcohol abuse Father   . Arthritis Father   . Hypertension Father   . Arthritis Maternal Grandmother   . Heart disease Maternal Grandmother   . Stroke Maternal Grandmother   . Hypertension Maternal Grandmother   . Diabetes Maternal Grandmother   . Arthritis Maternal Grandfather   . Colon cancer Maternal Grandfather   . Prostate cancer Maternal Grandfather   . Heart disease Maternal Grandfather   . Stroke Maternal Grandfather   . Hypertension Maternal Grandfather   . Arthritis Paternal Grandmother   . Heart disease Paternal Grandmother   . Hypertension Paternal Grandmother   . Diabetes Paternal Grandmother   . Arthritis Paternal Grandfather   . Heart disease Paternal Grandfather   . Hypertension Paternal Grandfather     Social History: Social History   Social History  . Marital Status: Married    Spouse Name: N/A  . Number of Children: N/A  .  Years of Education: N/A   Social History Main Topics  . Smoking status: Current Some Day Smoker  . Smokeless tobacco: Never Used     Comment: Smokes socially.   . Alcohol Use: 0.0 oz/week    0 Standard drinks or equivalent per week     Comment: 2-3 glasses of wine; ~ 2x/month.  . Drug Use: No  . Sexual Activity: Yes   Other Topics Concern  . None   Social History Narrative    Allergies: Allergies  Allergen Reactions  . Latex     Outpatient Meds: Current Outpatient Prescriptions  Medication Sig Dispense Refill  . albuterol (PROVENTIL HFA;VENTOLIN HFA) 108 (90 Base) MCG/ACT inhaler Inhale 2 puffs into the lungs every 6 (six) hours as needed for wheezing or shortness of breath. 1 Inhaler 0  . Multiple Vitamins-Minerals (MULTIVITAMIN ADULT PO) Take by mouth. Reported on 01/16/2016    . oxyCODONE-acetaminophen (ROXICET) 5-325 MG tablet Take 1 tablet by mouth every 8 (eight) hours as needed for severe pain. 20 tablet 0  . promethazine (PHENERGAN) 25 MG tablet Take 1 tablet (25 mg total) by mouth every 8 (eight) hours as needed for nausea or vomiting. 20 tablet 0   No current facility-administered medications for this visit.      ___________________________________________________________________  Objective  Exam:  BP 110/80 mmHg  Pulse 102  Ht 5' 5.75" (1.67 m)  Wt 253 lb (114.76 kg)  BMI 41.15 kg/m2   General: this is a(n) Fatigued young overweight  female who was brought into the office in a wheelchair by her husband. She looks pale, she is lightheaded when she stands and needed assistance getting on the exam table.  Eyes: sclera anicteric, no redness  ENT: oral mucosa moist without lesions, no cervical or supraclavicular lymphadenopathy, good dentition  CV: RRR without murmur, S1/S2, no JVD, no peripheral edema  Resp: clear to auscultation bilaterally, normal RR and effort noted  GI: soft,  Tenderness to palpation of the epigastric and right upper quadrant  abdominal wall., with active bowel sounds. No guarding or palpable organomegaly noted.  Skin; warm and dry, no rash or jaundice noted  Neuro: awake, alert and oriented x 3. Normal gross motor function and fluent speech  Extensive laboratory and imaging studies as noted above. There was questionable inflammation in a segment of the right colon on initial CT scan at [facility], none on the CT scan done at [facility] 2 days ago. Original ultrasound showed no gallstone, there was a gallstone on the subsequent CT scan. EGD was normal MRCP was normal No HIDA scan done  Assessment: Encounter Diagnoses  Name Primary?  . RUQ pain Yes  . Intractable cyclical vomiting with nausea     There is no discernible organic digestive disease on any of her workup thus far, which I think has been complete. I do not feel colonoscopy would add anything to this. He seems to had some unknown trigger of this upper abdominal pain and then a subsequent prolonged pain response. I think a less likely scenario is something mechanical such as band of adhesive scar tissue, and less likely cholecystitis given the lack of gallbladder inflammation on imaging so far in a patient with this degree of pain and tenderness. At this point, however, my overriding concern is that she may be at risk for volume depletion since she reports having virtually no oral liquids today and producing no urine. If we take her at her work for that, then she is at risk for volume depletion regardless of the cause of this pain. I feel that if she is not directly admitted, she will almost certainly wind up in the emergency room in the next day or 2. Despite reports above, she did not exhibit narcotic seeking behavior with me.  Plan:  I've spoken with the admitting hospitalist, who has agreed to bring her in under observation to give her IV fluids and to obtain a surgical consult to see if a diagnostic laparoscopy would be in order. If they do not feel that it  would be indicated, or if they feel she would be best served returning to [facility] for this evaluation, then so be it. If so, then I feel we will done all we can for her at our institution. I've alerted our PA, and Dr. Henrene Pastor will be on her hospital consult for our group.  Thank you for the courtesy of this consult.  Please call me with any questions or concerns.  Nelida Meuse III

## 2016-01-18 ENCOUNTER — Observation Stay (HOSPITAL_COMMUNITY): Payer: BLUE CROSS/BLUE SHIELD

## 2016-01-18 ENCOUNTER — Observation Stay (HOSPITAL_COMMUNITY): Payer: BLUE CROSS/BLUE SHIELD | Admitting: Certified Registered Nurse Anesthetist

## 2016-01-18 ENCOUNTER — Encounter (HOSPITAL_COMMUNITY)
Admission: AD | Disposition: A | Discharge status: Home / Self Care | Payer: Self-pay | Source: Ambulatory Visit | Attending: Family Medicine

## 2016-01-18 ENCOUNTER — Encounter (HOSPITAL_COMMUNITY): Payer: Self-pay | Admitting: Certified Registered"

## 2016-01-18 DIAGNOSIS — R1011 Right upper quadrant pain: Secondary | ICD-10-CM

## 2016-01-18 DIAGNOSIS — K8070 Calculus of gallbladder and bile duct without cholecystitis without obstruction: Secondary | ICD-10-CM

## 2016-01-18 DIAGNOSIS — K811 Chronic cholecystitis: Secondary | ICD-10-CM | POA: Diagnosis not present

## 2016-01-18 HISTORY — PX: CHOLECYSTECTOMY: SHX55

## 2016-01-18 LAB — CBC
HCT: 37.4 % (ref 36.0–46.0)
Hemoglobin: 12.7 g/dL (ref 12.0–15.0)
MCH: 31.7 pg (ref 26.0–34.0)
MCHC: 34 g/dL (ref 30.0–36.0)
MCV: 93.3 fL (ref 78.0–100.0)
PLATELETS: 261 10*3/uL (ref 150–400)
RBC: 4.01 MIL/uL (ref 3.87–5.11)
RDW: 12.5 % (ref 11.5–15.5)
WBC: 8.2 10*3/uL (ref 4.0–10.5)

## 2016-01-18 LAB — COMPREHENSIVE METABOLIC PANEL
ALK PHOS: 42 U/L (ref 38–126)
ALT: 21 U/L (ref 14–54)
AST: 15 U/L (ref 15–41)
Albumin: 3.7 g/dL (ref 3.5–5.0)
Anion gap: 9 (ref 5–15)
BILIRUBIN TOTAL: 0.4 mg/dL (ref 0.3–1.2)
BUN: 13 mg/dL (ref 6–20)
CALCIUM: 9 mg/dL (ref 8.9–10.3)
CO2: 25 mmol/L (ref 22–32)
CREATININE: 0.8 mg/dL (ref 0.44–1.00)
Chloride: 108 mmol/L (ref 101–111)
GFR calc Af Amer: >60 mL/min (ref >60)
Glucose, Bld: 104 mg/dL — ABNORMAL HIGH (ref 65–99)
POTASSIUM: 3.6 mmol/L (ref 3.5–5.1)
Sodium: 142 mmol/L (ref 135–145)
Total Protein: 6.2 g/dL — ABNORMAL LOW (ref 6.5–8.1)

## 2016-01-18 LAB — SURGICAL PCR SCREEN
MRSA, PCR: NEGATIVE
STAPHYLOCOCCUS AUREUS: NEGATIVE

## 2016-01-18 SURGERY — LAPAROSCOPIC CHOLECYSTECTOMY WITH INTRAOPERATIVE CHOLANGIOGRAM
Anesthesia: General | Site: Abdomen

## 2016-01-18 MED ORDER — PROPOFOL 10 MG/ML IV BOLUS
INTRAVENOUS | Status: AC
  Filled 2016-01-18: qty 20

## 2016-01-18 MED ORDER — FENTANYL CITRATE (PF) 250 MCG/5ML IJ SOLN
INTRAMUSCULAR | Status: AC
  Filled 2016-01-18: qty 5

## 2016-01-18 MED ORDER — HYDROMORPHONE HCL 1 MG/ML IJ SOLN
0.2500 mg | INTRAMUSCULAR | Status: DC | PRN
  Administered 2016-01-18: 0.5 mg via INTRAVENOUS

## 2016-01-18 MED ORDER — BUPIVACAINE-EPINEPHRINE 0.25% -1:200000 IJ SOLN
INTRAMUSCULAR | Status: AC
  Filled 2016-01-18: qty 1

## 2016-01-18 MED ORDER — IOPAMIDOL (ISOVUE-300) INJECTION 61%
INTRAVENOUS | Status: DC | PRN
  Administered 2016-01-18: 5 mL

## 2016-01-18 MED ORDER — SUCCINYLCHOLINE CHLORIDE 20 MG/ML IJ SOLN
INTRAMUSCULAR | Status: DC | PRN
  Administered 2016-01-18: 100 mg via INTRAVENOUS

## 2016-01-18 MED ORDER — PROCHLORPERAZINE EDISYLATE 5 MG/ML IJ SOLN
5.0000 mg | Freq: Once | INTRAMUSCULAR | Status: AC
  Administered 2016-01-18: 5 mg via INTRAVENOUS
  Filled 2016-01-18: qty 2

## 2016-01-18 MED ORDER — BUPIVACAINE-EPINEPHRINE 0.25% -1:200000 IJ SOLN
INTRAMUSCULAR | Status: DC | PRN
  Administered 2016-01-18: 27 mL

## 2016-01-18 MED ORDER — MIDAZOLAM HCL 5 MG/5ML IJ SOLN
INTRAMUSCULAR | Status: DC | PRN
  Administered 2016-01-18: 2 mg via INTRAVENOUS

## 2016-01-18 MED ORDER — HYDROMORPHONE BOLUS VIA INFUSION: 0.5000 mg | INTRAVENOUS | Status: DC

## 2016-01-18 MED ORDER — ONDANSETRON HCL 4 MG/2ML IJ SOLN
INTRAMUSCULAR | Status: AC
  Filled 2016-01-18: qty 2

## 2016-01-18 MED ORDER — FENTANYL CITRATE (PF) 100 MCG/2ML IJ SOLN
INTRAMUSCULAR | Status: DC | PRN
  Administered 2016-01-18 (×2): 50 ug via INTRAVENOUS
  Administered 2016-01-18: 150 ug via INTRAVENOUS

## 2016-01-18 MED ORDER — LACTATED RINGERS IR SOLN
Status: DC | PRN
  Administered 2016-01-18: 1000 mL

## 2016-01-18 MED ORDER — FENTANYL CITRATE (PF) 100 MCG/2ML IJ SOLN
25.0000 ug | INTRAMUSCULAR | Status: DC | PRN
  Administered 2016-01-18 (×3): 50 ug via INTRAVENOUS

## 2016-01-18 MED ORDER — KETOROLAC TROMETHAMINE 30 MG/ML IJ SOLN
INTRAMUSCULAR | Status: AC
  Filled 2016-01-18: qty 1

## 2016-01-18 MED ORDER — IOPAMIDOL (ISOVUE-300) INJECTION 61%
INTRAVENOUS | Status: AC
  Filled 2016-01-18: qty 50

## 2016-01-18 MED ORDER — LIDOCAINE HCL (CARDIAC) 20 MG/ML IV SOLN
INTRAVENOUS | Status: DC | PRN
  Administered 2016-01-18: 100 mg via INTRAVENOUS

## 2016-01-18 MED ORDER — LIDOCAINE HCL (CARDIAC) 20 MG/ML IV SOLN
INTRAVENOUS | Status: AC
  Filled 2016-01-18: qty 5

## 2016-01-18 MED ORDER — PROPOFOL 10 MG/ML IV BOLUS
INTRAVENOUS | Status: DC | PRN
  Administered 2016-01-18: 200 mg via INTRAVENOUS

## 2016-01-18 MED ORDER — DEXAMETHASONE SODIUM PHOSPHATE 10 MG/ML IJ SOLN
INTRAMUSCULAR | Status: AC
  Filled 2016-01-18: qty 1

## 2016-01-18 MED ORDER — SODIUM CHLORIDE 0.9 % IV SOLN
INTRAVENOUS | Status: DC
  Administered 2016-01-18: 15:00:00 via INTRAVENOUS

## 2016-01-18 MED ORDER — 0.9 % SODIUM CHLORIDE (POUR BTL) OPTIME
TOPICAL | Status: DC | PRN
  Administered 2016-01-18: 1000 mL

## 2016-01-18 MED ORDER — CEFAZOLIN SODIUM-DEXTROSE 2-3 GM-% IV SOLR
INTRAVENOUS | Status: DC | PRN
  Administered 2016-01-18: 2 g via INTRAVENOUS

## 2016-01-18 MED ORDER — MORPHINE SULFATE (PF) 2 MG/ML IV SOLN
1.0000 mg | INTRAVENOUS | Status: DC | PRN
  Administered 2016-01-18 (×3): 2 mg via INTRAVENOUS
  Filled 2016-01-18 (×3): qty 1

## 2016-01-18 MED ORDER — SUGAMMADEX SODIUM 200 MG/2ML IV SOLN
INTRAVENOUS | Status: DC | PRN
  Administered 2016-01-18: 200 mg via INTRAVENOUS

## 2016-01-18 MED ORDER — DEXAMETHASONE SODIUM PHOSPHATE 10 MG/ML IJ SOLN
INTRAMUSCULAR | Status: DC | PRN
  Administered 2016-01-18: 10 mg via INTRAVENOUS

## 2016-01-18 MED ORDER — MIDAZOLAM HCL 2 MG/2ML IJ SOLN
INTRAMUSCULAR | Status: AC
  Filled 2016-01-18: qty 2

## 2016-01-18 MED ORDER — MORPHINE SULFATE (PF) 2 MG/ML IV SOLN
1.0000 mg | Freq: Once | INTRAVENOUS | Status: AC
  Administered 2016-01-18: 1 mg via INTRAVENOUS
  Filled 2016-01-18: qty 1

## 2016-01-18 MED ORDER — FENTANYL CITRATE (PF) 100 MCG/2ML IJ SOLN
INTRAMUSCULAR | Status: AC
  Filled 2016-01-18: qty 2

## 2016-01-18 MED ORDER — DEXTROSE 5 % IV SOLN
3.0000 g | INTRAVENOUS | Status: DC
  Filled 2016-01-18: qty 3000

## 2016-01-18 MED ORDER — LORAZEPAM 2 MG/ML IJ SOLN
0.5000 mg | Freq: Once | INTRAMUSCULAR | Status: AC
  Administered 2016-01-18: 0.5 mg via INTRAVENOUS
  Filled 2016-01-18: qty 1

## 2016-01-18 MED ORDER — ONDANSETRON HCL 4 MG/2ML IJ SOLN
INTRAMUSCULAR | Status: DC | PRN
  Administered 2016-01-18: 4 mg via INTRAVENOUS

## 2016-01-18 MED ORDER — HYDROMORPHONE HCL 1 MG/ML IJ SOLN
INTRAMUSCULAR | Status: AC
Start: 2016-01-18 — End: 2016-01-19
  Filled 2016-01-18: qty 1

## 2016-01-18 MED ORDER — HYDROMORPHONE HCL 1 MG/ML IJ SOLN
0.5000 mg | INTRAMUSCULAR | Status: DC | PRN
  Administered 2016-01-18 – 2016-01-19 (×5): 0.5 mg via INTRAVENOUS
  Filled 2016-01-18 (×5): qty 1

## 2016-01-18 MED ORDER — ROCURONIUM BROMIDE 100 MG/10ML IV SOLN
INTRAVENOUS | Status: DC | PRN
  Administered 2016-01-18: 40 mg via INTRAVENOUS

## 2016-01-18 MED ORDER — LACTATED RINGERS IV SOLN
INTRAVENOUS | Status: DC | PRN
  Administered 2016-01-18: 13:00:00 via INTRAVENOUS

## 2016-01-18 MED ORDER — SUGAMMADEX SODIUM 200 MG/2ML IV SOLN
INTRAVENOUS | Status: AC
  Filled 2016-01-18: qty 2

## 2016-01-18 MED ORDER — ROCURONIUM BROMIDE 100 MG/10ML IV SOLN
INTRAVENOUS | Status: AC
  Filled 2016-01-18: qty 1

## 2016-01-18 SURGICAL SUPPLY — 34 items
APPLIER CLIP ROT 10 11.4 M/L (STAPLE) ×3
CABLE HIGH FREQUENCY MONO STRZ (ELECTRODE) ×3 IMPLANT
CATH REDDICK CHOLANGI 4FR 50CM (CATHETERS) IMPLANT
CHLORAPREP W/TINT 26ML (MISCELLANEOUS) ×3 IMPLANT
CLIP APPLIE ROT 10 11.4 M/L (STAPLE) ×1 IMPLANT
COVER MAYO STAND STRL (DRAPES) ×3 IMPLANT
COVER SURGICAL LIGHT HANDLE (MISCELLANEOUS) ×6 IMPLANT
DECANTER SPIKE VIAL GLASS SM (MISCELLANEOUS) ×3 IMPLANT
DRAPE C-ARM 42X120 X-RAY (DRAPES) ×3 IMPLANT
DRAPE LAPAROSCOPIC ABDOMINAL (DRAPES) ×3 IMPLANT
ELECT REM PT RETURN 9FT ADLT (ELECTROSURGICAL) ×3
ELECTRODE REM PT RTRN 9FT ADLT (ELECTROSURGICAL) ×1 IMPLANT
GLOVE BIOGEL PI IND STRL 7.5 (GLOVE) ×6 IMPLANT
GLOVE BIOGEL PI INDICATOR 7.5 (GLOVE) ×12
GLOVE ECLIPSE 7.5 STRL STRAW (GLOVE) IMPLANT
GOWN STRL REUS W/TWL XL LVL3 (GOWN DISPOSABLE) ×12 IMPLANT
HEMOSTAT SNOW SURGICEL 2X4 (HEMOSTASIS) IMPLANT
HEMOSTAT SURGICEL 4X8 (HEMOSTASIS) IMPLANT
KIT BASIN OR (CUSTOM PROCEDURE TRAY) ×3 IMPLANT
LIQUID BAND (GAUZE/BANDAGES/DRESSINGS) ×3 IMPLANT
POSITIONER SURGICAL ARM (MISCELLANEOUS) IMPLANT
POUCH SPECIMEN RETRIEVAL 10MM (ENDOMECHANICALS) IMPLANT
SCISSORS LAP 5X35 DISP (ENDOMECHANICALS) ×6 IMPLANT
SET CHOLANGIOGRAPH MIX (MISCELLANEOUS) ×3 IMPLANT
SET IRRIG TUBING LAPAROSCOPIC (IRRIGATION / IRRIGATOR) ×3 IMPLANT
SLEEVE XCEL OPT CAN 5 100 (ENDOMECHANICALS) ×3 IMPLANT
SUT MNCRL AB 4-0 PS2 18 (SUTURE) ×3 IMPLANT
TAPE CLOTH 4X10 WHT NS (GAUZE/BANDAGES/DRESSINGS) IMPLANT
TOWEL OR 17X26 10 PK STRL BLUE (TOWEL DISPOSABLE) ×3 IMPLANT
TRAY LAPAROSCOPIC (CUSTOM PROCEDURE TRAY) ×3 IMPLANT
TROCAR BLADELESS OPT 5 100 (ENDOMECHANICALS) ×3 IMPLANT
TROCAR XCEL BLUNT TIP 100MML (ENDOMECHANICALS) ×3 IMPLANT
TROCAR XCEL NON-BLD 11X100MML (ENDOMECHANICALS) ×3 IMPLANT
TUBING INSUF HEATED (TUBING) ×3 IMPLANT

## 2016-01-18 NOTE — Anesthesia Procedure Notes (Signed)
Procedure Name: Intubation Date/Time: 01/18/2016 1:35 PM Performed by: Gaston Islam EVETTE Pre-anesthesia Checklist: Patient identified, Suction available and Patient being monitored Patient Re-evaluated:Patient Re-evaluated prior to inductionOxygen Delivery Method: Circle system utilized Preoxygenation: Pre-oxygenation with 100% oxygen Intubation Type: IV induction Ventilation: Mask ventilation without difficulty Laryngoscope Size: Mac and 4 Grade View: Grade I Tube type: Oral Tube size: 7.0 mm Number of attempts: 1 Airway Equipment and Method: Patient positioned with wedge pillow and Stylet Placement Confirmation: ETT inserted through vocal cords under direct vision,  breath sounds checked- equal and bilateral,  positive ETCO2 and CO2 detector Secured at: 22 cm Tube secured with: Tape Dental Injury: Teeth and Oropharynx as per pre-operative assessment

## 2016-01-18 NOTE — Progress Notes (Signed)
Patient ID: Jane Jackson, female   DOB: Jul 16, 1986, 30 y.o.   MRN: [id number]    Pt admitted after office visit yesterday with Dr Loletha Carrow. See office consult note for details. Surgical consult per Dr Excell Seltzer noted - with plan for Lap Cholecystectomy later today We will be available if needed  Appreciate Hospitalist and Surgery help with care of pt.  GI ATTENDING  GI office note reviewed. Surgical plans noted. Available if needed.  Jane Jackson. Jane Jackson., M.D. [facility] Division of Gastroenterology

## 2016-01-18 NOTE — Progress Notes (Signed)
Initial Nutrition Assessment  DOCUMENTATION CODES:   Not applicable  INTERVENTION:  -RD continue to monitor  NUTRITION DIAGNOSIS:   Inadequate oral intake related to poor appetite, nausea as evidenced by per patient/family report.  GOAL:   Patient will meet greater than or equal to 90% of their needs  MONITOR:   PO intake, Labs, I & O's, Skin, Weight trends  REASON FOR ASSESSMENT:   Malnutrition Screening Tool    ASSESSMENT:   Jane Jackson is a 30 y.o. female with PMH of morbid obesity, asthma, anxiety, hemorrhoids and anal fissure recent admission and [facility] for abdominal pain from 4/3- 4/21. Per records, she was treated for diverticulitis for 7 days  Pt was in OR during visit. Information retrieved from chart.  Pt was treated for 2 weeks at [facility] for diverticulitis vs constipation -> No improvement. Imaging has found slude vs stones in the gall bladder. Patient currently throwing up within 5 minutes of eating anything. Admits to 15# wt loss in 1 month.  Per surgery, they have proceeded forward with a cholecystectomy.   She was following clear liquids at home following d/c from [facility] but returned to [place] shortly after.  Will monitor if PO intake improves following cholecystectomy vs. Further nutrition interventions  Labs and Medications reviewed.  Diet Order:  Diet NPO time specified  Skin:  Wound (see comment) (Closed wound to abdomen)  Last BM:  4/24  Height:   Ht Readings from Last 1 Encounters:  01/17/16 5\' 4"  (1.626 m)    Weight:   Wt Readings from Last 1 Encounters:  01/17/16 253 lb (114.76 kg)    Ideal Body Weight:  54.54 kg  BMI:  Body mass index is 43.41 kg/(m^2).  Estimated Nutritional Needs:   Kcal:  1700-2100 calories  Protein:  110-120 grams  Fluid:  >/= 1.7L  EDUCATION NEEDS:   No education needs identified at this time  [name]. Machele Deihl, MS, RD LDN After Hours/Weekend Pager [phone]

## 2016-01-18 NOTE — Anesthesia Postprocedure Evaluation (Signed)
Anesthesia Post Note  Patient: Jane Jackson  Procedure(s) Performed: Procedure(s) (LRB): LAPAROSCOPIC CHOLECYSTECTOMY WITH INTRAOPERATIVE CHOLANGIOGRAM (N/A)  Patient location during evaluation: PACU Anesthesia Type: General Level of consciousness: awake and alert Pain management: pain level controlled Vital Signs Assessment: post-procedure vital signs reviewed and stable Respiratory status: spontaneous breathing, nonlabored ventilation, respiratory function stable and patient connected to nasal cannula oxygen Cardiovascular status: blood pressure returned to baseline and stable Postop Assessment: no signs of nausea or vomiting Anesthetic complications: no    Last Vitals:  Filed Vitals:   01/18/16 1600 01/18/16 1618  BP: 114/71 127/73  Pulse: 56 59  Temp: 36.5 C 36.7 C  Resp: 12 15    Last Pain:  Filed Vitals:   01/18/16 1737  PainSc: 10-Worst pain ever                 Catalina Gravel

## 2016-01-18 NOTE — Anesthesia Preprocedure Evaluation (Signed)
Anesthesia Evaluation  Patient identified by MRN, date of birth, ID band Patient awake    Reviewed: Allergy & Precautions, NPO status , Patient's Chart, lab work & pertinent test results  History of Anesthesia Complications (+) history of anesthetic complications (possible bronchospasm with asthma history)  Airway Mallampati: II  TM Distance: >3 FB Neck ROM: Full    Dental  (+) Teeth Intact, Dental Advisory Given   Pulmonary asthma , Current Smoker,    Pulmonary exam normal breath sounds clear to auscultation       Cardiovascular Exercise Tolerance: Good negative cardio ROS Normal cardiovascular exam Rhythm:Regular Rate:Normal     Neuro/Psych PSYCHIATRIC DISORDERS Anxiety negative neurological ROS     GI/Hepatic Neg liver ROS, Cholecystitis   Endo/Other  Morbid obesity  Renal/GU negative Renal ROS     Musculoskeletal negative musculoskeletal ROS (+)   Abdominal   Peds  Hematology negative hematology ROS (+)   Anesthesia Other Findings Day of surgery medications reviewed with the patient.  Reproductive/Obstetrics negative OB ROS                             Anesthesia Physical Anesthesia Plan  ASA: III  Anesthesia Plan: General   Post-op Pain Management:    Induction: Intravenous  Airway Management Planned: Oral ETT  Additional Equipment:   Intra-op Plan:   Post-operative Plan: Extubation in OR  Informed Consent: I have reviewed the patients History and Physical, chart, labs and discussed the procedure including the risks, benefits and alternatives for the proposed anesthesia with the patient or authorized representative who has indicated his/her understanding and acceptance.   Dental advisory given  Plan Discussed with: CRNA  Anesthesia Plan Comments: (Risks/benefits of general anesthesia discussed with patient including risk of damage to teeth, lips, gum, and tongue,  nausea/vomiting, allergic reactions to medications, and the possibility of heart attack, stroke and death.  All patient questions answered.  Patient wishes to proceed.)        Anesthesia Quick Evaluation

## 2016-01-18 NOTE — Transfer of Care (Signed)
Immediate Anesthesia Transfer of Care Note  Patient: Jane Jackson  Procedure(s) Performed: Procedure(s): LAPAROSCOPIC CHOLECYSTECTOMY WITH INTRAOPERATIVE CHOLANGIOGRAM (N/A)  Patient Location: PACU  Anesthesia Type:General  Level of Consciousness: awake, alert  and oriented  Airway & Oxygen Therapy: Patient Spontanous Breathing and Patient connected to face mask oxygen  Post-op Assessment: Report given to RN and Post -op Vital signs reviewed and stable  Post vital signs: Reviewed and stable  Last Vitals:  Filed Vitals:   01/18/16 0532 01/18/16 1227  BP: 101/52 132/73  Pulse: 60 63  Temp: 36.4 C 36.8 C  Resp: 16 18    Last Pain:  Filed Vitals:   01/18/16 1227  PainSc: 7       Patients Stated Pain Goal: 3 ([id number])  Complications: No apparent anesthesia complications

## 2016-01-18 NOTE — Op Note (Signed)
Preoperative diagnosis: Cholelithiasis and cholecystitis  Postoperative diagnosis: Cholelithiasis and cholecystitis  Surgical procedure: Laparoscopic cholecystectomy with intraoperative cholangiogram  Surgeon: Marland Kitchen T. Deaun Rocha M.D.  Assistant: None  Anesthesia: General Endotracheal  Complications: None  Estimated blood loss: Minimal  Description of procedure: The patient brought to the operating room, placed in the supine position on the operating table, and general endotracheal anesthesia induced. The abdomen was widely sterilely prepped and draped. The patient had received preoperative IV antibiotics and PAS were in place. Patient timeout was performed the correct procedure verified. Standard 4 port technique was used with an open Hassan cannula at the umbilicus and the remainder of the ports placed under direct vision. The gallbladder was visualized. It appeared Somewhat chronically thickened with some omental adhesions to the infundibulum but not severely inflamed. The fundus was grasped and elevated up over the liver and the infundibulum retracted inferiolaterally.  Omental adhesions were taken down off the infundibulum Peritoneum anterior and posterior to close triangle was incised and fibrofatty tissue stripped off the neck of the gallbladder toward the porta hepatis. The distal gallbladder was thoroughly dissected. The cystic artery was identified in close triangle and the cystic duct gallbladder junction dissected 360.  A good critical view was obtained. When the anatomy was clear the cystic duct was clipped at the gallbladder junction and an operative cholangiogram obtained through the cystic duct. This showed good filling of a normal common bile duct and intrahepatic ducts with free flow into the duodenum and no filling defects. Following this the Cholangiocath was removed and the cystic duct was doubly clipped proximally and divided. The cystic artery was doubly clipped proximally  and distally and divided. The gallbladder was dissected free from its bed using hook cautery and removed through the umbilical port site. Complete hemostasis was obtained in the gallbladder bed. The right upper quadrant was thoroughly irrigated and hemostasis assured. Trochars were removed and all CO2 evacuated and the [facility] trocar site fascial defect closed. Skin incisions were closed with subcuticular Monocryl and Liquiban. Sponge needle and instrument counts were correct. The patient was taken to PACU in good condition.  Ames Hoban T  01/18/2016

## 2016-01-18 NOTE — Progress Notes (Signed)
Patient ID: Jane Jackson, female   DOB: [birth year], 30 y.o.   MRN: [id number]    Subjective: She has continued  Right upper quadrant pain today.  Objective: Vital signs in last 24 hours: Temp:  [97.6 F (36.4 C)-99 F (37.2 C)] 97.6 F (36.4 C) (04/26 0532) Pulse Rate:  [60-102] 60 (04/26 0532) Resp:  [16-17] 16 (04/26 0532) BP: (101-142)/(52-80) 101/52 mmHg (04/26 0532) SpO2:  [97 %-100 %] 97 % (04/26 0543) Weight:  [114.76 kg (253 lb)] 114.76 kg (253 lb) (04/25 1729) Last BM Date: 01/16/16 (very small per patient; not eating much)  Intake/Output from previous day: 04/25 0701 - 04/26 0700 In: 1210.4 [I.V.:1210.4] Out: 150 [Urine:150] Intake/Output this shift:    General appearance: alert, cooperative and mild distress GI: moderately obese. Localized right upper quadrant tenderness.  Lab Results:   Recent Labs  01/17/16 1809 01/18/16 0403  WBC 10.5 8.2  HGB 13.7 12.7  HCT 40.2 37.4  PLT 266 261   BMET  Recent Labs  01/17/16 1809 01/18/16 0403  NA 140 142  K 4.1 3.6  CL 106 108  CO2 25 25  GLUCOSE 88 104*  BUN 12 13  CREATININE 0.89 0.80  CALCIUM 9.3 9.0     Studies/Results: No results found.  Anti-infectives: Anti-infectives    None      Assessment/Plan: Several week history of persistent and recurrent right upper quadrant pain  Very typical for biliary tract pain associated with nausea and vomiting. She has had an extensive workup at [facility] and due to with 3 CT scans showing sludge in the gallbladder and one scan being read as small gallstones. No evidence of inflammation of the gallbladder.  One scan showed some possible stranding next to her right colon but follow-up scan was negative. MRCP was negative. Upper endoscopy negative. I think it is very likely she has cholecystitis or persistent biliary colic. I certainly think the next step is to proceed with laparoscopic cholecystectomy with cholangiogram. I discussed with the patient that she is not  entirely classic in that  She does not have obvious stones on both studies or evidence of inflammation around the gallbladder but she certainly has typical symptoms and abnormalities seen in her gallbladder and no other source found. She is anxious to proceed with cholecystectomy. I discussed the procedure in detail.  We discussed the risks and benefits of a laparoscopic cholecystectomy and possible cholangiogram including, but not limited to bleeding, infection, injury to surrounding structures such as the intestine or liver, bile leak, retained gallstones, need to convert to an open procedure, prolonged diarrhea, blood clots such as  DVT, common bile duct injury, anesthesia risks, and possible need for additional procedures.  The likelihood of improvement in symptoms and return to the patient's normal status is good. We discussed the typical post-operative recovery course.       Jerremy Maione T 01/18/2016

## 2016-01-18 NOTE — Progress Notes (Signed)
PROGRESS NOTE    Jane Jackson  [id number] DOB: 12/05/1985 DOA: 01/17/2016 PCP: Coral Spikes, DO  Outpatient Specialists:     Brief Narrative:  30 y/o ? Bipolar Anal fissure Asthma recent hospitalization Body mass index is 43.41 kg/(m^2).  recent Rx for 12/26/2015-01/13/2016 diverticulitis [facility]--hospital course was complicated by severe constipation, CT scan showed gallstones without cholecystitis MRCP EGD unrevealing and patient left hospital 4/21  Has lost 15 pounds in 1 month Seen by gastroenterology and because of persistent symptoms nausea vomiting and decreased urination was referred as a direct admit   general surgery was consulted  Laparoscopic cholecystectomy with IOL performed 01/18/2016   Assessment & Plan:   Active Problems:   RUQ pain -Biliary colic versus atypical cholelithiasis -Patient day 0 laparoscopic cholecystectomy IOL Monitor in a.m. with labs and pain control   Body mass index is 43.41 kg/(m^2). -Dietary counseling, consider bariatric surgery given comorbidities  Asthma, mild intermittent -Outpatient PFTs  Prior hemorrhoids, anal fissure -Proper bowel regimen as now on opiates    DVT prophylaxis: (Lovenox/Heparin/SCD's/anticoagulated/None (if comfort care) Code Status: (Full/Partial - specify details) Family Communication: (Specify name, relationship & date discussed. NO "discussed with patient") Disposition Plan: (specify when and where you expect patient to be discharged)   Consults  general su.rgery  Procedures:   Lap Chole + IOL 4/26  Antimicrobials:    noen    Subjective:   Patient was seen early in the morning prior to surgery was in 7/10 pain, localized primarily right upper quadrant. Not hungry No chest pain No diarrhea   tells me periods are excessive and he uses tampons continuously-this is been long-standing.    Objective: Filed Vitals:   01/18/16 1227 01/18/16 1515 01/18/16 1530 01/18/16 1545    BP: 132/73 128/73 120/80 116/79  Pulse: 63 67 68 64  Temp: 98.3 F (36.8 C) 97.7 F (36.5 C)  97.9 F (36.6 C)  TempSrc: Oral     Resp: 18 11 13 12   Height:      Weight:      SpO2: 99% 98% 99% 100%    Intake/Output Summary (Last 24 hours) at 01/18/16 1601 Last data filed at 01/18/16 1525  Gross per 24 hour  Intake 2210.42 ml  Output    175 ml  Net 2035.42 ml   Filed Weights   01/17/16 1729  Weight: 114.76 kg (253 lb)    Examination:  General exam: uncomfortable ?,  looks much older than stated age  Respiratory system: Clear to auscultation. Respiratory effort normal. Cardiovascular system: S1 & S2 heard, RRR. No JVD, murmurs Gastrointestinal system: Abdomen is tender in RUQ ? Mild HMegally Skin: No rashes, lesions or ulcers Psychiatry: Judgement and insight appear normal. Mood & affect appropriate.     Data Reviewed: I have personally reviewed following labs and imaging studies  CBC:  Recent Labs Lab 01/17/16 1809 01/18/16 0403  WBC 10.5 8.2  HGB 13.7 12.7  HCT 40.2 37.4  MCV 93.5 93.3  PLT 266 0000000   Basic Metabolic Panel:  Recent Labs Lab 01/17/16 1809 01/18/16 0403  NA 140 142  K 4.1 3.6  CL 106 108  CO2 25 25  GLUCOSE 88 104*  BUN 12 13  CREATININE 0.89 0.80  CALCIUM 9.3 9.0   GFR: Estimated Creatinine Clearance: 125.4 mL/min (by C-G formula based on Cr of 0.8). Liver Function Tests:  Recent Labs Lab 01/17/16 1809 01/18/16 0403  AST 21 15  ALT 24 21  ALKPHOS 45 42  BILITOT 0.4 0.4  PROT 6.6 6.2*  ALBUMIN 4.0 3.7   No results for input(s): LIPASE, AMYLASE in the last 168 hours. No results for input(s): AMMONIA in the last 168 hours. Coagulation Profile: No results for input(s): INR, PROTIME in the last 168 hours. Cardiac Enzymes: No results for input(s): CKTOTAL, CKMB, CKMBINDEX, TROPONINI in the last 168 hours. BNP (last 3 results) No results for input(s): PROBNP in the last 8760 hours. HbA1C: No results for input(s):  HGBA1C in the last 72 hours. CBG: No results for input(s): GLUCAP in the last 168 hours. Lipid Profile: No results for input(s): CHOL, HDL, LDLCALC, TRIG, CHOLHDL, LDLDIRECT in the last 72 hours. Thyroid Function Tests: No results for input(s): TSH, T4TOTAL, FREET4, T3FREE, THYROIDAB in the last 72 hours. Anemia Panel: No results for input(s): VITAMINB12, FOLATE, FERRITIN, TIBC, IRON, RETICCTPCT in the last 72 hours. Urine analysis:    Component Value Date/Time   COLORURINE YELLOW 01/17/2016 2105   APPEARANCEUR CLOUDY* 01/17/2016 2105   LABSPEC 1.021 01/17/2016 2105   PHURINE 5.5 01/17/2016 2105   GLUCOSEU NEGATIVE 01/17/2016 2105   HGBUR NEGATIVE 01/17/2016 2105   BILIRUBINUR NEGATIVE 01/17/2016 2105   [place] NEGATIVE 01/17/2016 2105   PROTEINUR NEGATIVE 01/17/2016 2105   NITRITE NEGATIVE 01/17/2016 2105   LEUKOCYTESUR NEGATIVE 01/17/2016 2105   Sepsis Labs: @LABRCNTIP (procalcitonin:4,lacticidven:4)  )No results found for this or any previous visit (from the past 240 hour(s)).       Radiology Studies: Dg Cholangiogram Operative  01/18/2016  CLINICAL DATA:  Laparoscopic cholecystectomy EXAM: INTRAOPERATIVE CHOLANGIOGRAM FLUOROSCOPY TIME:  15 seconds (3.4 mGy) COMPARISON:  None FINDINGS: Intraoperative cholangiographic images of the right upper abdominal quadrant during laparoscopic cholecystectomy are provided for review. Surgical clips overlie the expected location of the gallbladder fossa. Contrast injection demonstrates selective cannulation of the central aspect of the cystic duct. There is passage of contrast through the central aspect of the cystic duct with filling of a non dilated common bile duct. There is passage of contrast though the CBD and into the descending portion of the duodenum. There is minimal reflux of injected contrast into the common hepatic duct and central aspect of the non dilated intrahepatic biliary system. There are no discrete filling defects  within the opacified portions of the biliary system to suggest the presence of choledocholithiasis. IMPRESSION: No evidence of choledocholithiasis. Electronically Signed   By: Sandi Mariscal M.D.   On: 01/18/2016 15:09        Scheduled Meds: .  ceFAZolin (ANCEF) IV  3 g Intravenous 30 min Pre-Op  . [MAR Hold] enoxaparin (LOVENOX) injection  40 mg Subcutaneous Q24H  . fentaNYL      . fentaNYL      . HYDROmorphone      . ketorolac       Continuous Infusions: . sodium chloride 75 mL/hr at 01/18/16 1525        Time spent: [place], MD [facility] ([facility]   If 7PM-7AM, please contact night-coverage [email or web address] Password [facility] 01/18/2016, 4:01 PM

## 2016-01-19 DIAGNOSIS — K811 Chronic cholecystitis: Secondary | ICD-10-CM | POA: Diagnosis not present

## 2016-01-19 LAB — COMPREHENSIVE METABOLIC PANEL
ALT: 99 U/L — AB (ref 14–54)
AST: 100 U/L — AB (ref 15–41)
Albumin: 3.8 g/dL (ref 3.5–5.0)
Alkaline Phosphatase: 53 U/L (ref 38–126)
Anion gap: 10 (ref 5–15)
BUN: 5 mg/dL — AB (ref 6–20)
CHLORIDE: 107 mmol/L (ref 101–111)
CO2: 21 mmol/L — AB (ref 22–32)
CREATININE: 0.69 mg/dL (ref 0.44–1.00)
Calcium: 8.9 mg/dL (ref 8.9–10.3)
GFR calc Af Amer: >60 mL/min (ref >60)
GFR calc non Af Amer: >60 mL/min (ref >60)
GLUCOSE: 167 mg/dL — AB (ref 65–99)
Potassium: 3.9 mmol/L (ref 3.5–5.1)
SODIUM: 138 mmol/L (ref 135–145)
Total Bilirubin: 0.3 mg/dL (ref 0.3–1.2)
Total Protein: 6.3 g/dL — ABNORMAL LOW (ref 6.5–8.1)

## 2016-01-19 LAB — CBC
HCT: 37.3 % (ref 36.0–46.0)
Hemoglobin: 13.1 g/dL (ref 12.0–15.0)
MCH: 32.3 pg (ref 26.0–34.0)
MCHC: 35.1 g/dL (ref 30.0–36.0)
MCV: 91.9 fL (ref 78.0–100.0)
PLATELETS: 286 10*3/uL (ref 150–400)
RBC: 4.06 MIL/uL (ref 3.87–5.11)
RDW: 12.2 % (ref 11.5–15.5)
WBC: 13 10*3/uL — ABNORMAL HIGH (ref 4.0–10.5)

## 2016-01-19 MED ORDER — HYDROMORPHONE HCL 1 MG/ML IJ SOLN
0.5000 mg | INTRAMUSCULAR | Status: DC | PRN
  Administered 2016-01-19 (×2): 0.5 mg via INTRAVENOUS
  Filled 2016-01-19 (×2): qty 1

## 2016-01-19 MED ORDER — HYDROCODONE-ACETAMINOPHEN 5-325 MG PO TABS: 1.0000 | ORAL_TABLET | ORAL | Status: DC | PRN

## 2016-01-19 MED ORDER — ACETAMINOPHEN 325 MG PO TABS: 650.0000 mg | ORAL_TABLET | Freq: Four times a day (QID) | ORAL | Status: DC | PRN

## 2016-01-19 MED ORDER — PROMETHAZINE HCL 25 MG PO TABS: 25.0000 mg | ORAL_TABLET | Freq: Three times a day (TID) | ORAL | Status: DC | PRN

## 2016-01-19 MED ORDER — PROCHLORPERAZINE EDISYLATE 5 MG/ML IJ SOLN
5.0000 mg | Freq: Once | INTRAMUSCULAR | Status: AC
  Administered 2016-01-19: 5 mg via INTRAVENOUS
  Filled 2016-01-19: qty 2

## 2016-01-19 NOTE — Progress Notes (Signed)
Nursing Discharge Summary  Patient ID: Shaylee Trace MRN: [id number] DOB/AGE: [birth year] 30 y.o.  Admit date: 01/17/2016 Discharge date: 01/19/2016  Discharged Condition: good  Disposition: 01-Home or Self Care  Follow-up Information    Follow up with [facility] On 02/08/2016.   Specialty:  General Surgery   Why:  arrive by 11:30aM for a 12PM post op check   Contact information:   [place] [address] [phone]       Follow up with [facility].   Why:  please contact rehab center to arrange appointments   Contact information:   [place] [address] [phone]      Prescriptions Given: Prescriptions given for oral Phenergan tablets and Vicodin tablets.  Patient follow up appointments and medications discussed.  Both wife and husband verbalized understanding without further questions.    Means of Discharge: Patient to be discharged home via wheelchair with husband in private vehicle.  Patient had rolling walker at discharge.    Signed: Buel Ream 01/19/2016, 1:36 PM

## 2016-01-19 NOTE — Progress Notes (Signed)
Patient ID: Jane Jackson, female   DOB: 01-Aug-1986, 30 y.o.   MRN: [id number] 1 Day Post-Op  Subjective: N/V last night but resolved this AM.  C/O significant incisional pain but states pre op pain gone.  Objective: Vital signs in last 24 hours: Temp:  [97.2 F (36.2 C)-98.3 F (36.8 C)] 97.7 F (36.5 C) (04/27 0545) Pulse Rate:  [56-96] 96 (04/27 0545) Resp:  [11-18] 16 (04/27 0545) BP: (114-138)/(64-80) 119/64 mmHg (04/27 0545) SpO2:  [97 %-100 %] 97 % (04/27 0545) FiO2 (%):  [89 %] 89 % (04/26 1515) Last BM Date: 01/16/16  Intake/Output from previous day: 04/26 0701 - 04/27 0700 In: 2511.9 [P.O.:240; I.V.:2271.9] Out: 25 [Blood:25] Intake/Output this shift:    General appearance: alert, cooperative and mild distress GI: abnormal findings:  mild tenderness incisional Incision/Wound: Some bloody drainage umbilicus, incisions intact  Lab Results:   Recent Labs  01/18/16 0403 01/19/16 0540  WBC 8.2 13.0*  HGB 12.7 13.1  HCT 37.4 37.3  PLT 261 286   BMET  Recent Labs  01/18/16 0403 01/19/16 0540  NA 142 138  K 3.6 3.9  CL 108 107  CO2 25 21*  GLUCOSE 104* 167*  BUN 13 5*  CREATININE 0.80 0.69  CALCIUM 9.0 8.9     Studies/Results: Dg Cholangiogram Operative  01/18/2016  CLINICAL DATA:  Laparoscopic cholecystectomy EXAM: INTRAOPERATIVE CHOLANGIOGRAM FLUOROSCOPY TIME:  15 seconds (3.4 mGy) COMPARISON:  None FINDINGS: Intraoperative cholangiographic images of the right upper abdominal quadrant during laparoscopic cholecystectomy are provided for review. Surgical clips overlie the expected location of the gallbladder fossa. Contrast injection demonstrates selective cannulation of the central aspect of the cystic duct. There is passage of contrast through the central aspect of the cystic duct with filling of a non dilated common bile duct. There is passage of contrast though the CBD and into the descending portion of the duodenum. There is minimal reflux of injected  contrast into the common hepatic duct and central aspect of the non dilated intrahepatic biliary system. There are no discrete filling defects within the opacified portions of the biliary system to suggest the presence of choledocholithiasis. IMPRESSION: No evidence of choledocholithiasis. Electronically Signed   By: Sandi Mariscal M.D.   On: 01/18/2016 15:09    Anti-infectives: Anti-infectives    Start     Dose/Rate Route Frequency Ordered Stop   01/18/16 1329  ceFAZolin (ANCEF) 3 g in dextrose 5 % 50 mL IVPB     3 g 130 mL/hr over 30 Minutes Intravenous 30 min pre-op 01/18/16 1330        Assessment/Plan: s/p Procedure(s): LAPAROSCOPIC CHOLECYSTECTOMY WITH INTRAOPERATIVE CHOLANGIOGRAM Doing reasonably well.  Weak from recent hospitalization.  Plan probable discharge later today after PT evaluation      Jalaya Sarver T 01/19/2016

## 2016-01-19 NOTE — Evaluation (Signed)
Physical Therapy Evaluation Patient Details Name: Pari Cryan MRN: [id number] DOB: [birth year] Today's Date: 01/19/2016   History of Present Illness  Pt s/p lap chole.  Pt reports very recnt hospital stay at [facility] x 17 days  Clinical Impression  Pt admitted as above and presenting with functional mobility limitations 2* generalized weakness and post op abdominal pain.  Pt should progress to dc home with 24/7 assist of family.    Follow Up Recommendations No PT follow up    Equipment Recommendations  Rolling walker with 5" wheels    Recommendations for Other Services       Precautions / Restrictions Precautions Precautions: Fall;Other (comment) Precaution Comments: Abdominal surg 01/18/16 Restrictions Weight Bearing Restrictions: No      Mobility  Bed Mobility Overal bed mobility: Needs Assistance Bed Mobility: Supine to Sit;Sit to Supine     Supine to sit: Min assist;Min guard Sit to supine: Min assist;Min guard   General bed mobility comments: cues sequence and log roll technique in/out of bed  Transfers Overall transfer level: Needs assistance Equipment used: Rolling walker (2 wheeled) Transfers: Sit to/from Stand Sit to Stand: Min guard         General transfer comment: cues for transition position and use of UEs to self assist.  to/from EOB, recliner and comode  Ambulation/Gait Ambulation/Gait assistance: Min guard;Supervision Ambulation Distance (Feet): 70 Feet (twice) Assistive device: Rolling walker (2 wheeled);Straight cane Gait Pattern/deviations: Step-through pattern;Decreased step length - right;Decreased step length - left;Shuffle;Trunk flexed Gait velocity: decr   General Gait Details: Pt ambulated 22' with RW and cues for posture and position from RW.  Pt attempted ambulation with cane but with noted increased instability and abd discomfort.  Pt completed trip with RW and acknowledges need for same for temporary home use  Stairs             Wheelchair Mobility    Modified Rankin (Stroke Patients Only)       Balance Overall balance assessment: Needs assistance Sitting-balance support: No upper extremity supported;Feet supported Sitting balance-Leahy Scale: Good     Standing balance support: No upper extremity supported Standing balance-Leahy Scale: Fair                               Pertinent Vitals/Pain Pain Assessment: Faces Faces Pain Scale: Hurts even more Pain Location: Rt side abdominal area with transfers in/out bed Pain Descriptors / Indicators: Grimacing Pain Intervention(s): Limited activity within patient's tolerance;Monitored during session    Home Living Family/patient expects to be discharged to:: Private residence Living Arrangements: Spouse/significant other Available Help at Discharge: Family Type of Home: House Home Access: Stairs to enter   Technical brewer of Steps: 1 Home Layout: One level Home Equipment: None      Prior Function Level of Independence: Independent               Hand Dominance        Extremity/Trunk Assessment   Upper Extremity Assessment: Generalized weakness           Lower Extremity Assessment: Generalized weakness         Communication   Communication: No difficulties  Cognition Arousal/Alertness: Awake/alert Behavior During Therapy: WFL for tasks assessed/performed Overall Cognitive Status: Within Functional Limits for tasks assessed                      General Comments  Exercises        Assessment/Plan    PT Assessment Patient needs continued PT services  PT Diagnosis Difficulty walking   PT Problem List Decreased strength;Decreased activity tolerance;Decreased balance;Decreased mobility;Decreased knowledge of use of DME;Obesity;Pain  PT Treatment Interventions DME instruction;Gait training;Stair training;Functional mobility training;Therapeutic activities;Patient/family education   PT  Goals (Current goals can be found in the Care Plan section) Acute Rehab PT Goals Patient Stated Goal: Regain IND and complete courses at [facility] PT Goal Formulation: With patient Time For Goal Achievement: 01/25/16 Potential to Achieve Goals: Good    Frequency Min 3X/week   Barriers to discharge        Co-evaluation               End of Session   Activity Tolerance: Patient tolerated treatment well;Patient limited by pain Patient left: in bed;with call bell/phone within reach;with family/visitor present Nurse Communication: Mobility status    Functional Assessment Tool Used: Clinical Judgement Functional Limitation: Mobility: Walking and moving around Mobility: Walking and Moving Around Current Status [id number]): At least 1 percent but less than 20 percent impaired, limited or restricted Mobility: Walking and Moving Around Goal Status [phone]): At least 1 percent but less than 20 percent impaired, limited or restricted    Time: [id number] PT Time Calculation (min) (ACUTE ONLY): 37 min   Charges:   PT Evaluation $PT Eval Low Complexity: 1 Procedure PT Treatments $Gait Training: 8-22 mins   PT G Codes:   PT G-Codes **NOT FOR INPATIENT CLASS** Functional Assessment Tool Used: Clinical Judgement Functional Limitation: Mobility: Walking and moving around Mobility: Walking and Moving Around Current Status [id number]): At least 1 percent but less than 20 percent impaired, limited or restricted Mobility: Walking and Moving Around Goal Status [phone]): At least 1 percent but less than 20 percent impaired, limited or restricted    Jourdan Maldonado 01/19/2016, 1:15 PM

## 2016-01-19 NOTE — Progress Notes (Signed)
MD paged d/t pt c/o 8/10 abdominal and back pain.  Patient states she has had no relief from current pain medications as ordered.  Awaiting md call

## 2016-01-19 NOTE — Discharge Summary (Signed)
Physician Discharge Summary  Patient ID: Jane Jackson MRN: [id number] DOB/AGE: 01-04-1986 4 y.o.  Admit date: 01/17/2016 Discharge date: 01/19/2016  Admitting Diagnosis: Symptomatic cholelithiasis  Discharge Diagnosis Patient Active Problem List   Diagnosis Date Noted  . RUQ pain 01/17/2016  . Abdominal pain, RUQ 01/16/2016  . Abdominal pain, epigastric 01/16/2016    Consultants Internal medicine initially admitted, transferred onto surgical service post operatively.    Imaging: Dg Cholangiogram Operative  01/18/2016  CLINICAL DATA:  Laparoscopic cholecystectomy EXAM: INTRAOPERATIVE CHOLANGIOGRAM FLUOROSCOPY TIME:  15 seconds (3.4 mGy) COMPARISON:  None FINDINGS: Intraoperative cholangiographic images of the right upper abdominal quadrant during laparoscopic cholecystectomy are provided for review. Surgical clips overlie the expected location of the gallbladder fossa. Contrast injection demonstrates selective cannulation of the central aspect of the cystic duct. There is passage of contrast through the central aspect of the cystic duct with filling of a non dilated common bile duct. There is passage of contrast though the CBD and into the descending portion of the duodenum. There is minimal reflux of injected contrast into the common hepatic duct and central aspect of the non dilated intrahepatic biliary system. There are no discrete filling defects within the opacified portions of the biliary system to suggest the presence of choledocholithiasis. IMPRESSION: No evidence of choledocholithiasis. Electronically Signed   By: Sandi Mariscal M.D.   On: 01/18/2016 15:09    Procedures Laparoscopic cholecystectomy with IOC---Dr. [facility] Course:  Jane Jackson is a 30 year old female who was [facility] and underwent multiple CT scan, upper endoscopy, MRCP and abdominal US, treated for constipation.  She presented to [facility] with persistent RUQ abdominal pain. She was found  to have gallstones.  Patient was admitted and underwent procedure listed above.  Tolerated procedure well and was transferred to the floor.  Diet was advanced as tolerated.  On POD#1, the patient was voiding well, tolerating diet, ambulating well, pain well controlled, vital signs stable, incisions c/d/i and felt stable for discharge home.  Physical therapy was consulted for deconditioning who recommended a walker and OP PT, this was arranged.  She was provided with Rx for norco, advised to wean off.  The patient does exhibit some signs of diversion/dependence and therefore would be VERY cautious with prescribing any additional narcotics.  She was encourage to use OTC analgesics.  Medication risks, benefits and therapeutic alternatives were reviewed with the patient.  She verbalizes understanding.   Patient will follow up in our office in 2 weeks and knows to call with questions or concerns.  Physical Exam: General:  Alert, NAD, pleasant, comfortable Abd:  Soft, ND, mild tenderness, incisions C/D/I    Medication List    STOP taking these medications        clonazePAM 1 MG tablet  Commonly known as:  KLONOPIN     oxyCODONE-acetaminophen 5-325 MG tablet  Commonly known as:  ROXICET      TAKE these medications        acetaminophen 325 MG tablet  Commonly known as:  TYLENOL  Take 2 tablets (650 mg total) by mouth every 6 (six) hours as needed for mild pain (or Fever >/= 101).     albuterol 1.25 MG/3ML nebulizer solution  Commonly known as:  ACCUNEB  Take 3 mLs by nebulization 2 (two) times daily as needed. Asthma.     albuterol 108 (90 Base) MCG/ACT inhaler  Commonly known as:  PROVENTIL HFA;VENTOLIN HFA  Inhale 2 puffs into the lungs every  6 (six) hours as needed for wheezing or shortness of breath.     HYDROcodone-acetaminophen 5-325 MG tablet  Commonly known as:  NORCO/VICODIN  Take 1-2 tablets by mouth every 4 (four) hours as needed for moderate pain.     LORazepam 1 MG tablet   Commonly known as:  ATIVAN  Take 1 mg by mouth 2 (two) times daily as needed. Sleep.     MULTIVITAMIN ADULT PO  Take by mouth. Reported on 01/16/2016     promethazine 25 MG tablet  Commonly known as:  PHENERGAN  Take 1 tablet (25 mg total) by mouth every 8 (eight) hours as needed for nausea or vomiting.             Follow-up Information    Follow up with [facility] On 02/08/2016.   Specialty:  General Surgery   Why:  arrive by 11:30aM for a 12PM post op check   Contact information:   [place] [address] [phone]       Follow up with [facility].   Why:  please contact rehab center to arrange appointments   Contact information:   [place] [address] [phone]      Signed: Erby Pian, [facility] Surgery [phone]  01/19/2016, 12:31 PM

## 2016-01-19 NOTE — Care Management Note (Signed)
Case Management Note  Patient Details  Name: Jane Jackson MRN: [id number] Date of Birth: 1985/12/26  Subjective/Objective:    Laparoscopic Cholecystectomy with Intraoperative Cholangiogram                 Action/Plan: Discharge Planning: AVS reviewed:  NCM spoke to pt at bedside. Requesting RW for home. Contacted [facility] DME rep for RW. Delivered to room. Pt requesting outpt PT. Faxed referral to [facility]. Provided contact info on pt's dc instructions. Pt feels she would benefit from therapy. States she has lost over 15 lbs in the past few weeks and she feels extremely week.   Expected Discharge Date:  01/19/2016             Expected Discharge Plan:  Home/Self Care  In-House Referral:  NA  Discharge planning Services  CM Consult  Post Acute Care Choice:  NA Choice offered to:  NA  DME Arranged:  N/A DME Agency:  NA  HH Arranged:  NA HH Agency:  NA  Status of Service:  Completed, signed off  Medicare Important Message Given:    Date Medicare IM Given:    Medicare IM give by:    Date Additional Medicare IM Given:    Additional Medicare Important Message give by:     If discussed at [place] of Stay Meetings, dates discussed:    Additional Comments:  Erenest Rasher, RN 01/19/2016, 12:37 PM

## 2016-01-19 NOTE — Progress Notes (Signed)
Patient seen and examined and plan of care discussed with patient directly as well as with Dr. Excell Seltzer Postop nausea and discomfort but seems much better She is to have a diet I have asked therapy personally to come and evaluate the patient for recommendations regarding mobility Appreciate general surgery input I will sign off-call if questions  Verneita Griffes, MD [facility] ([phone]

## 2016-01-19 NOTE — Discharge Instructions (Signed)

## 2016-01-19 NOTE — Progress Notes (Signed)
MD paged re: uncontrolled pain and now 2 episodes of nausea with vomiting.  Awaiting MD call

## 2016-01-21 NOTE — Telephone Encounter (Signed)
error 

## 2016-01-30 ENCOUNTER — Telehealth: Payer: Self-pay | Admitting: Surgery

## 2016-01-30 NOTE — Telephone Encounter (Signed)
[name] had a emergent lap chole by Dr. Excell Seltzer on 01/18/2016. Her umbilical incision has opened up.  She saw someone in the office last week who put her on Bactrim.  She is not running a fever.  I advised her to clean the wound and dress the wound. She will call the office in the AM so someone can look at it.  Alphonsa Overall, MD, [facility] Surgery Pager: [phone] Office phone:  [phone]

## 2016-01-31 ENCOUNTER — Other Ambulatory Visit: Payer: Self-pay | Admitting: Surgery

## 2016-01-31 DIAGNOSIS — R1084 Generalized abdominal pain: Secondary | ICD-10-CM

## 2016-02-01 ENCOUNTER — Ambulatory Visit
Admission: RE | Admit: 2016-02-01 | Discharge: 2016-02-01 | Disposition: A | Discharge status: Home / Self Care | Payer: BLUE CROSS/BLUE SHIELD | Source: Ambulatory Visit | Attending: Surgery | Admitting: Surgery

## 2016-02-01 DIAGNOSIS — R1084 Generalized abdominal pain: Secondary | ICD-10-CM

## 2016-02-01 MED ORDER — IOPAMIDOL (ISOVUE-300) INJECTION 61%
125.0000 mL | Freq: Once | INTRAVENOUS | Status: AC | PRN
  Administered 2016-02-01: 125 mL via INTRAVENOUS

## 2016-02-07 ENCOUNTER — Ambulatory Visit: Payer: BLUE CROSS/BLUE SHIELD

## 2016-02-09 ENCOUNTER — Ambulatory Visit: Payer: BLUE CROSS/BLUE SHIELD | Attending: Surgery

## 2016-02-09 DIAGNOSIS — M6281 Muscle weakness (generalized): Secondary | ICD-10-CM | POA: Diagnosis not present

## 2016-02-09 DIAGNOSIS — R2689 Other abnormalities of gait and mobility: Secondary | ICD-10-CM | POA: Insufficient documentation

## 2016-02-09 NOTE — Patient Instructions (Signed)
SIT TO STAND: Feet Narrow    Place feet apart. Lean chest forward. Raise hips and straighten knees to stand. _10__ reps per set, _2_ sets. Perform twice/day, 7 days per week. Hold onto a support.  (Home) Squat: (Assist)    Using supports, arms close to body, squat by dropping hips back as if sitting in a chair. Repeat __10__ times per set. Do _2___ sets per session. Do _7___ days per week.

## 2016-02-09 NOTE — Therapy (Signed)
[place] MAIN [facility] SERVICES [address] [place], [place], [address] Phone: [phone]   Fax:  [phone]  Physical Therapy Evaluation  Patient Details  Name: Jane Jackson MRN: [id number] Date of Birth: May 22, 1986 Referring Provider: Michael Boston  Encounter Date: 02/09/2016      PT End of Session - 02/09/16 0906    Visit Number 1   Number of Visits 13   Date for PT Re-Evaluation 03/22/16   PT Start Time 0915   PT Stop Time 1015   PT Time Calculation (min) 60 min   Equipment Utilized During Treatment Gait belt   Activity Tolerance Patient tolerated treatment well   Behavior During Therapy [facility] for tasks assessed/performed      Past Medical History  Diagnosis Date  . Chicken pox   . Asthma   . Kidney stones   . Diverticulitis   . Impaction, bowel ([place])   . Anxiety   . Complication of anesthesia     issue with asthma and intubation    Past Surgical History  Procedure Laterality Date  . Appendectomy    . Tonsillectomy and adenoidectomy      age 30  . Ruptured cyst    . Tympanostomy tube placement    . Cholecystectomy N/A 01/18/2016    Procedure: LAPAROSCOPIC CHOLECYSTECTOMY WITH INTRAOPERATIVE CHOLANGIOGRAM;  Surgeon: Excell Seltzer, MD;  Location: [facility] ORS;  Service: General;  Laterality: N/A;    There were no vitals filed for this visit.       Subjective Assessment - 02/09/16 1043    Subjective Deconditioning, weakness, and balance dysfunction   Pertinent History Pt reports she went to [facility] ED on 12/26/15 for abdominal pain and was hospitalized for cholecystitis. She was scheduled for gallbladder removal the following day. Prior to the surgery she had an abdominal CT which revealed diverticulitis. Her surgery was postponed in order to treat her for this new finding. Pt reports she stayed at [facility] for almost1 month and had multiple complications including bowel impaction, PICC line placement with infiltration, and superficial  venous thrombi in her L arm and R forearm. Pt was instructed not to perform blood pressures on her L arm. She is not currently on any anticoagulants and reports that her PCP is not concerned about the thrombi. Pt reports that she eventually left [facility] AMA because "they almost killed me," and was admitted directly to [facility] by her PCP. She underwent cholecystectomy on 01/18/16 and was discharged two days later on 01/20/16. Pt has a post-operative follow-up appointment this Friday 02/10/16 with her surgeon. Since the surgery her main gallbladder incision has re-opened and she has been packing the wound. She has had multiple appointments with the surgeon to assess the wound. Pt was referred to physical therapy for deconditioning. She reports LE weakness and impaired balance. She has suffered 3 falls since the surgery from loss of balance and LE weakness. Pt reports that she struggles with her weight but was very active prior to surgery. 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 She also reports running multiple miles at a time. Pt was recently married in December 2016 and has worked in the past as a Audiological scientist.    Currently in Pain? Yes   Pain Score 3   Worst: 9/10, Best: 3/10   Pain Location Abdomen   Pain Orientation Other (Comment)  Generalized abdominal pain   Pain Descriptors / Indicators Aching   Pain Radiating Towards Subacute  pain, not radiating   Pain Onset 1 to 4 weeks ago   Pain Frequency Constant   Aggravating Factors  Using abdominal muscles, too much activity, lack of sleep   Pain Relieving Factors Sleeping, rest   Multiple Pain Sites No            [facility] PT Assessment - 02/09/16 1152    Assessment   Medical Diagnosis R53.81: Malaise   Referring Provider Michael Boston   Onset Date/Surgical Date 12/26/15   Hand Dominance Right   Next MD Visit 02/10/16, follow-up with surgery   Prior Therapy PT evaluation at [facility] prior to discharge    Precautions   Precautions Fall   Restrictions   Weight Bearing Restrictions No   Balance Screen   Has the patient fallen in the past 6 months Yes   How many times? 4   Has the patient had a decrease in activity level because of a fear of falling?  Yes   [place] Private residence   Living Arrangements Spouse/significant other   Available Help at Discharge Family   Type of [place] Access Level entry   Home Layout Two level   Alternate Level Stairs-Number of Steps Flight   Alternate Level Stairs-Rails Right   [place] - 2 wheels   Prior Function   Level of Independence Independent   Vocation Student   Vocation Requirements Pt walks across campus to classes   Leisure Pt reports enjoying running prior to becoming sick   Cognition   Overall Cognitive Status Within Functional Limits for tasks assessed   Behaviors Other (comment)  Pt tearful at times. Frustrated regarding debility   Observation/Other Assessments   Skin Integrity Pt with closed and healing laproscopic incisions in abdomen with the exception of RUQ incision which is open and packed with gauze. Pt is seeing her surgeon regarding poor wound healing from surgical incision. No eyrthema, echhymosis, or edema noted in bilateral UEs   Sensation   Light Touch Appears Intact   Additional Comments Pt denies numbness/tingling UE/LE   Posture/Postural Control   Posture Comments Pt with increased truncal adiposity but no gross deficits in posture noted   ROM / Strength   AROM / PROM / Strength Strength   Strength   Overall Strength Deficits   Overall Strength Comments Pt with functional weakness in LE. MMT is at least 4+ to 5/5 throughout LE for hip flexion and rotation, knee flex/ext, and ankle DF. UE strength is grossly WFL and tested at least 4+/5 throughout. Pt reports return to mostly normal UE strength. LE weakness noted with sit to stand transfers, TUG, 5TSTS, and 63m gait.  LE tremulous in standing and with gait   Ambulation/Gait   Gait Comments Pt with very slow and labored gait. Decreased toe clearance and bilateral IR at hips during gait. LE are tremulous and pt ambulates with rolling walker with heavy UE reliance. Fatigues quickly.    Standardized Balance Assessment   Standardized Balance Assessment 10 meter walk test;Five Times Sit to Stand;Timed Up and Go Test   10 Meter Walk 37.41 seconds = 0.27 m/s   Timed Up and Go Test   TUG Normal TUG   Normal TUG (seconds) 47.34      Treatment  Pt furnished with written HEP. Explanation provided to husband and patient. Performed sit to stand x 5 (pt becomes tearful due to abdominal pain). Standing mini squats in walker x 10 with  verbal cues for proper form/technique. Discussed exercise modification to minimize pain. Avoided exercises to stress abdominal muscles due to poorly healing abdominal incision.                     PT Education - 02/09/16 1253    Education provided Yes   Education Details HEP furnished, plan of care   Person(s) Educated Patient   Methods Explanation;Demonstration;Verbal cues;Handout   Comprehension Verbalized understanding;Returned demonstration;Verbal cues required             PT Long Term Goals - 02/09/16 1300    PT LONG TERM GOAL #1   Title Pt will be independent with HEP in order to improve strength/balance so she can resume full function at home and with her academic studies   Time 6   Period Weeks   Status New   PT LONG TERM GOAL #2   Title Pt will decrease 5TSTS to <15 seconds in order to demonstrate improved LE strength   Baseline 02/09/16: 27.66 seconds   Time 6   Period Weeks   Status New   PT LONG TERM GOAL #3   Title Pt will improve gait speed by at least 0.13 m/s in order to demonstrate clinically significant improvement in gait speed   Baseline 02/09/16: 0.27 m/s   Time 6   Period Weeks   Status New   PT LONG TERM GOAL #4   Title Pt will  improve TUG by at least 5 seconds in order to demonstrates improvement in strength and balance   Baseline 02/09/16: 47.34 seconds   Time 6   Period Weeks   Status New               Plan - 02/09/16 1254    Clinical Impression Statement Pt is a pleasant 30 yo female referred for weakness following prolonged hospitalization. Pt reports being admitted and bedridden for approximately 3 weeks at [facility] followed by cholecystectomy on 01/18/16 at [facility]. She reports bilateral UE superficial venous thrombi as well as poorly healing abdominal incision s/p surgery. She has a follow-up appointment with surgeon tomorrow 02/10/16. PT evaluation reveals generalized LE weakness with slow and labored transfers/gait. Pt also with poor balance which will be further tested at next session. TUG 47.34 seconds, 61m gait speed 0.27 m/s, and 5TSTS 27.66 seconds. All outcome measures indicate significant  weakness, disability, and deviation from norms for age/gender. Pt will benefit from skilled PT services to address deficits in LE weakness and balance dysfunction in order to return to prior level of function at home and with her academic studies.   Rehab Potential Good   Clinical Impairments Affecting Rehab Potential Positive: motivation, age, minimal comorbid conditions: Negative: prolonged bedrest/debility, poor balance   PT Frequency 2x / week   PT Duration 6 weeks   PT Treatment/Interventions ADLs/Self Care Home Management;Aquatic Therapy;Electrical Stimulation;Iontophoresis 4mg /ml Dexamethasone;Moist Heat;Traction;Ultrasound;DME Instruction;Gait training;Stair training;Functional mobility training;Therapeutic activities;Therapeutic exercise;Balance training;Neuromuscular re-education;Patient/family education;Manual techniques;Energy conservation   PT Next Visit Plan BERG or DGI, 6MWT, progress strengthening and balance   PT Home Exercise Plan Sit to stand without UE support from elevated surface, standing mini  squats inside of walker   Consulted and Agree with Plan of Care Patient      Patient will benefit from skilled therapeutic intervention in order to improve the following deficits and impairments:     Visit Diagnosis: Muscle weakness (generalized) - Plan: PT plan of care cert/re-cert  Other abnormalities of gait and mobility -  Plan: PT plan of care cert/re-cert     Problem List Patient Active Problem List   Diagnosis Date Noted  . RUQ pain 01/17/2016  . Abdominal pain, RUQ 01/16/2016  . Abdominal pain, epigastric 01/16/2016   Phillips Grout PT, DPT   Holbert Caples 02/09/2016, 1:13 PM  [place] MAIN [facility] SERVICES [address] [place], [place], [address] Phone: [phone]   Fax:  [phone]  Name: Jane Jackson MRN: [id number] Date of Birth: [birth year]

## 2016-02-15 ENCOUNTER — Ambulatory Visit: Payer: BLUE CROSS/BLUE SHIELD

## 2016-02-15 DIAGNOSIS — M6281 Muscle weakness (generalized): Secondary | ICD-10-CM | POA: Diagnosis not present

## 2016-02-15 DIAGNOSIS — R2689 Other abnormalities of gait and mobility: Secondary | ICD-10-CM

## 2016-02-15 NOTE — Patient Instructions (Addendum)
[email or web address] Seated heel raise 2 x 10 LAQ 2x10 Seated march 2x  10 Supine ball squeeze 3x  20  Supine hip ER yellow band 2x  10 BLE  SAQ 3 s hold 2 x 10

## 2016-02-15 NOTE — Therapy (Signed)
[place] MAIN [facility] SERVICES [address] [place], [place], [address] Phone: [phone]   Fax:  [phone]  Physical Therapy Treatment  Patient Details  Name: Jane Jackson MRN: [id number] Date of Birth: December 21, 1985 Referring Provider: Michael Boston  Encounter Date: 02/15/2016      PT End of Session - 02/15/16 1247    Visit Number 2   Number of Visits 13   Date for PT Re-Evaluation 03/22/16   PT Start Time 0904   PT Stop Time 0945   PT Time Calculation (min) 41 min   Equipment Utilized During Treatment Gait belt   Activity Tolerance Patient tolerated treatment well   Behavior During Therapy [facility] for tasks assessed/performed      Past Medical History  Diagnosis Date  . Chicken pox   . Asthma   . Kidney stones   . Diverticulitis   . Impaction, bowel ([place])   . Anxiety   . Complication of anesthesia     issue with asthma and intubation    Past Surgical History  Procedure Laterality Date  . Appendectomy    . Tonsillectomy and adenoidectomy      age 30  . Ruptured cyst    . Tympanostomy tube placement    . Cholecystectomy N/A 01/18/2016    Procedure: LAPAROSCOPIC CHOLECYSTECTOMY WITH INTRAOPERATIVE CHOLANGIOGRAM;  Surgeon: Excell Seltzer, MD;  Location: [facility];  Service: General;  Laterality: N/A;    There were no vitals filed for this visit.      Subjective Assessment - 02/15/16 0932    Subjective (p) pt reports she was sick earlier this week, but is feeling better.    Pertinent History (p) Pt reports she went to [facility] ED on 12/26/15 for abdominal pain and was hospitalized for cholecystitis. She was scheduled for gallbladder removal the following day. Prior to the surgery she had an abdominal CT which revealed diverticulitis. Her surgery was postponed in order to treat her for this new finding. Pt reports she stayed at [facility] for almost1 month and had multiple complications including bowel impaction, PICC line placement with  infiltration, and superficial venous thrombi in her L arm and R forearm. Pt was instructed not to perform blood pressures on her L arm. She is not currently on any anticoagulants and reports that her PCP is not concerned about the thrombi. Pt reports that she eventually left [facility] AMA because "they almost killed me," and was admitted directly to [facility] by her PCP. She underwent cholecystectomy on 01/18/16 and was discharged two days later on 01/20/16. Pt has a post-operative follow-up appointment this Friday 02/10/16 with her surgeon. Since the surgery her main gallbladder incision has re-opened and she has been packing the wound. She has had multiple appointments with the surgeon to assess the wound. Pt was referred to physical therapy for deconditioning. She reports LE weakness and impaired balance. She has suffered 3 falls since the surgery from loss of balance and LE weakness. Pt reports that she struggles with her weight but was very active prior to surgery. 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 She also reports running multiple miles at a time. Pt was recently married in December 2016 and has worked in the past as a Audiological scientist.    Currently in Pain? (p) Yes   Pain Score (p) 6    Pain Location (p) Abdomen   Pain Descriptors / Indicators (p) Burning   Pain Onset (p) 1 to 4  weeks ago         therex: Nustep: L 2 x 4 min LE + UE SPM ~20 Seated heel raise 2 x 10 LAQ 2x10 Seated march 2x  10 Supine ball squeeze 3x  20  Supine hip ER yellow band 2x  10 BLE  SAQ 3 s hold 2 x 10   mod cues for Diaphragmatic breathing to minimize intraabdominal pressure                             PT Long Term Goals - 02/09/16 1300    PT LONG TERM GOAL #1   Title Pt will be independent with HEP in order to improve strength/balance so she can resume full function at home and with her academic studies   Time 6   Period Weeks   Status New   PT LONG  TERM GOAL #2   Title Pt will decrease 5TSTS to <15 seconds in order to demonstrate improved LE strength   Baseline 02/09/16: 27.66 seconds   Time 6   Period Weeks   Status New   PT LONG TERM GOAL #3   Title Pt will improve gait speed by at least 0.13 m/s in order to demonstrate clinically significant improvement in gait speed   Baseline 02/09/16: 0.27 m/s   Time 6   Period Weeks   Status New   PT LONG TERM GOAL #4   Title Pt will improve TUG by at least 5 seconds in order to demonstrates improvement in strength and balance   Baseline 02/09/16: 47.34 seconds   Time 6   Period Weeks   Status New               Plan - 02/15/16 1247    Clinical Impression Statement pt did fair with seated and supine therex today. she was cued to perform diaphragmatic breathing throughout exercise to minimize interabdominal pressure. pt also cued to move only 1 LE at a time to reduce stress on the abdomin. pt was challenged with exercise today, but was able to complete with rest breaks and extended time.    Rehab Potential Good   Clinical Impairments Affecting Rehab Potential Positive: motivation, age, minimal comorbid conditions: Negative: prolonged bedrest/debility, poor balance   PT Frequency 2x / week   PT Duration 6 weeks   PT Treatment/Interventions ADLs/Self Care Home Management;Aquatic Therapy;Electrical Stimulation;Iontophoresis 4mg /ml Dexamethasone;Moist Heat;Traction;Ultrasound;DME Instruction;Gait training;Stair training;Functional mobility training;Therapeutic activities;Therapeutic exercise;Balance training;Neuromuscular re-education;Patient/family education;Manual techniques;Energy conservation   PT Next Visit Plan BERG or DGI, 6MWT, progress strengthening and balance   PT Home Exercise Plan Sit to stand without UE support from elevated surface, standing mini squats inside of walker   Consulted and Agree with Plan of Care Patient      Patient will benefit from skilled therapeutic  intervention in order to improve the following deficits and impairments:  Abnormal gait, Decreased strength, Difficulty walking, Pain, Impaired tone, Decreased balance, Decreased endurance  Visit Diagnosis: Muscle weakness (generalized)  Other abnormalities of gait and mobility     Problem List Patient Active Problem List   Diagnosis Date Noted  . RUQ pain 01/17/2016  . Abdominal pain, RUQ 01/16/2016  . Abdominal pain, epigastric 01/16/2016   Gorden Harms. Davari Lopes, PT, DPT [phone]  Jane Jackson 02/15/2016, 12:50 PM  [place] MAIN [facility] SERVICES [address] [place], [place], [address] Phone: [phone]   Fax:  [phone]  Name: Jane Jackson MRN: [id number]  Date of Birth: 1986-06-15

## 2016-02-22 ENCOUNTER — Ambulatory Visit: Payer: BLUE CROSS/BLUE SHIELD

## 2016-02-22 DIAGNOSIS — M6281 Muscle weakness (generalized): Secondary | ICD-10-CM | POA: Diagnosis not present

## 2016-02-22 DIAGNOSIS — R2689 Other abnormalities of gait and mobility: Secondary | ICD-10-CM

## 2016-02-22 NOTE — Therapy (Signed)
[place] MAIN [facility] SERVICES [address] [place], [place], [address] Phone: [phone]   Fax:  [phone]  Physical Therapy Treatment  Patient Details  Name: Jane Jackson MRN: [id number] Date of Birth: [birth year] Referring Provider: Michael Boston  Encounter Date: 02/22/2016      PT End of Session - 02/22/16 1248    Visit Number 3   Number of Visits 13   Date for PT Re-Evaluation 03/22/16   PT Start Time 1118   PT Stop Time 1200   PT Time Calculation (min) 42 min   Equipment Utilized During Treatment Gait belt   Activity Tolerance Patient tolerated treatment well   Behavior During Therapy [facility] for tasks assessed/performed      Past Medical History  Diagnosis Date  . Chicken pox   . Asthma   . Kidney stones   . Diverticulitis   . Impaction, bowel ([place])   . Anxiety   . Complication of anesthesia     issue with asthma and intubation    Past Surgical History  Procedure Laterality Date  . Appendectomy    . Tonsillectomy and adenoidectomy      age 30  . Ruptured cyst    . Tympanostomy tube placement    . Cholecystectomy N/A 01/18/2016    Procedure: LAPAROSCOPIC CHOLECYSTECTOMY WITH INTRAOPERATIVE CHOLANGIOGRAM;  Surgeon: Excell Seltzer, MD;  Location: [facility];  Service: General;  Laterality: N/A;    There were no vitals filed for this visit.      Subjective Assessment - 02/22/16 1247    Subjective pt reports he did her HEP, she feels like she is working the right muscles. pt reports she has not made f/u with GI doc yet, but she knows she needs to   Pertinent History Pt reports she went to [facility] ED on 12/26/15 for abdominal pain and was hospitalized for cholecystitis. She was scheduled for gallbladder removal the following day. Prior to the surgery she had an abdominal CT which revealed diverticulitis. Her surgery was postponed in order to treat her for this new finding. Pt reports she stayed at [facility] for almost1 month and had  multiple complications including bowel impaction, PICC line placement with infiltration, and superficial venous thrombi in her L arm and R forearm. Pt was instructed not to perform blood pressures on her L arm. She is not currently on any anticoagulants and reports that her PCP is not concerned about the thrombi. Pt reports that she eventually left [facility] AMA because "they almost killed me," and was admitted directly to [facility] by her PCP. She underwent cholecystectomy on 01/18/16 and was discharged two days later on 01/20/16. Pt has a post-operative follow-up appointment this Friday 02/10/16 with her surgeon. Since the surgery her main gallbladder incision has re-opened and she has been packing the wound. She has had multiple appointments with the surgeon to assess the wound. Pt was referred to physical therapy for deconditioning. She reports LE weakness and impaired balance. She has suffered 3 falls since the surgery from loss of balance and LE weakness. Pt reports that she struggles with her weight but was very active prior to surgery. 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 She also reports running multiple miles at a time. Pt was recently married in December 2016 and has worked in the past as a Audiological scientist.    Patient Stated Goals return to PLOF, very active lifestyle   Currently in Pain? Yes  Pain Score 4    Pain Location Abdomen   Pain Descriptors / Indicators Burning   Pain Onset 1 to 4 weeks ago      Therex: Nustep L 2 x 4 min no charge  TKE with ball on wall x 20 each leg Standing march 2 x 10  Standing hip abduction x 10 each leg Standing mini squat 2 x 10 Standing heel raise x 10 Standing toe raise x 20 Standing hip extension x 10 each leg Fwd step up onto 3 in aerobic step x 5 each leg Diaphragmatic breathing (initiating TA contraction) cued 50% of the time Pt requires min verbal and tactile cues for proper exercise performance   Pt cued to promote  heel / toe transfer during gait                             PT Education - 02/22/16 1248    Education provided Yes   Education Details Diaphragmantic breathing during exercises to reduce valsalva    Person(s) Educated Patient   Methods Explanation   Comprehension Verbalized understanding             PT Long Term Goals - 02/09/16 1300    PT LONG TERM GOAL #1   Title Pt will be independent with HEP in order to improve strength/balance so she can resume full function at home and with her academic studies   Time 6   Period Weeks   Status New   PT LONG TERM GOAL #2   Title Pt will decrease 5TSTS to <15 seconds in order to demonstrate improved LE strength   Baseline 02/09/16: 27.66 seconds   Time 6   Period Weeks   Status New   PT LONG TERM GOAL #3   Title Pt will improve gait speed by at least 0.13 m/s in order to demonstrate clinically significant improvement in gait speed   Baseline 02/09/16: 0.27 m/s   Time 6   Period Weeks   Status New   PT LONG TERM GOAL #4   Title Pt will improve TUG by at least 5 seconds in order to demonstrates improvement in strength and balance   Baseline 02/09/16: 47.34 seconds   Time 6   Period Weeks   Status New               Plan - 02/22/16 1249    Clinical Impression Statement pt did fairly well with standing strengthening today. she did get tired, needing rest breaks. pt needs mod cues to keep up with diaphragmatice breathing as to not increase interabdominal pressure. pt is quite slow and methodical with movement and does require quite a bit of UE support.    Rehab Potential Good   Clinical Impairments Affecting Rehab Potential Positive: motivation, age, minimal comorbid conditions: Negative: prolonged bedrest/debility, poor balance   PT Frequency 2x / week   PT Duration 6 weeks   PT Treatment/Interventions ADLs/Self Care Home Management;Aquatic Therapy;Electrical Stimulation;Iontophoresis 4mg /ml  Dexamethasone;Moist Heat;Traction;Ultrasound;DME Instruction;Gait training;Stair training;Functional mobility training;Therapeutic activities;Therapeutic exercise;Balance training;Neuromuscular re-education;Patient/family education;Manual techniques;Energy conservation   PT Next Visit Plan BERG or DGI, 6MWT, progress strengthening and balance   PT Home Exercise Plan Sit to stand without UE support from elevated surface, standing mini squats inside of walker   Consulted and Agree with Plan of Care Patient      Patient will benefit from skilled therapeutic intervention in order to improve the following deficits and impairments:  Abnormal gait, Decreased strength, Difficulty walking, Pain, Impaired tone, Decreased balance, Decreased endurance  Visit Diagnosis: Muscle weakness (generalized)  Other abnormalities of gait and mobility     Problem List Patient Active Problem List   Diagnosis Date Noted  . RUQ pain 01/17/2016  . Abdominal pain, RUQ 01/16/2016  . Abdominal pain, epigastric 01/16/2016   Gorden Harms. Tennelle Taflinger, PT, DPT [phone]  Benjimen Kelley 02/22/2016, 12:52 PM  [place] MAIN [facility] SERVICES [address] [place], [place], [address] Phone: [phone]   Fax:  [phone]  Name: Jane Jackson MRN: [id number] Date of Birth: [birth year]

## 2016-02-28 ENCOUNTER — Ambulatory Visit: Payer: BLUE CROSS/BLUE SHIELD | Attending: Surgery

## 2016-02-28 DIAGNOSIS — R2689 Other abnormalities of gait and mobility: Secondary | ICD-10-CM | POA: Insufficient documentation

## 2016-02-28 DIAGNOSIS — M6281 Muscle weakness (generalized): Secondary | ICD-10-CM | POA: Insufficient documentation

## 2016-02-28 NOTE — Therapy (Signed)
[place] MAIN [facility] SERVICES [address] [place], [place], [address] Phone: [phone]   Fax:  [phone]  Physical Therapy Treatment  Patient Details  Name: Jane Jackson MRN: [id number] Date of Birth: 1986/04/18 Referring Provider: Michael Boston  Encounter Date: 02/28/2016      PT End of Session - 02/28/16 1132    Visit Number 4   Number of Visits 13   Date for PT Re-Evaluation 03/22/16   PT Start Time 0915   PT Stop Time 1000   PT Time Calculation (min) 45 min   Equipment Utilized During Treatment Gait belt   Activity Tolerance Patient tolerated treatment well   Behavior During Therapy [facility] for tasks assessed/performed      Past Medical History  Diagnosis Date  . Chicken pox   . Asthma   . Kidney stones   . Diverticulitis   . Impaction, bowel ([place])   . Anxiety   . Complication of anesthesia     issue with asthma and intubation    Past Surgical History  Procedure Laterality Date  . Appendectomy    . Tonsillectomy and adenoidectomy      age 30  . Ruptured cyst    . Tympanostomy tube placement    . Cholecystectomy N/A 01/18/2016    Procedure: LAPAROSCOPIC CHOLECYSTECTOMY WITH INTRAOPERATIVE CHOLANGIOGRAM;  Surgeon: Excell Seltzer, MD;  Location: [facility];  Service: General;  Laterality: N/A;    There were no vitals filed for this visit.      Subjective Assessment - 02/28/16 0943    Subjective pt reports she is doing well, she feels she is progressing   Pertinent History Pt reports she went to [facility] ED on 12/26/15 for abdominal pain and was hospitalized for cholecystitis. She was scheduled for gallbladder removal the following day. Prior to the surgery she had an abdominal CT which revealed diverticulitis. Her surgery was postponed in order to treat her for this new finding. Pt reports she stayed at [facility] for almost1 month and had multiple complications including bowel impaction, PICC line placement with infiltration, and  superficial venous thrombi in her L arm and R forearm. Pt was instructed not to perform blood pressures on her L arm. She is not currently on any anticoagulants and reports that her PCP is not concerned about the thrombi. Pt reports that she eventually left [facility] AMA because "they almost killed me," and was admitted directly to [facility] by her PCP. She underwent cholecystectomy on 01/18/16 and was discharged two days later on 01/20/16. Pt has a post-operative follow-up appointment this Friday 02/10/16 with her surgeon. Since the surgery her main gallbladder incision has re-opened and she has been packing the wound. She has had multiple appointments with the surgeon to assess the wound. Pt was referred to physical therapy for deconditioning. She reports LE weakness and impaired balance. She has suffered 3 falls since the surgery from loss of balance and LE weakness. Pt reports that she struggles with her weight but was very active prior to surgery. 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 She also reports running multiple miles at a time. Pt was recently married in December 2016 and has worked in the past as a Audiological scientist.    Patient Stated Goals return to PLOF, very active lifestyle   Currently in Pain? Yes   Pain Score 3    Pain Location Abdomen   Pain Descriptors / Indicators Aching   Pain Onset 1  to 4 weeks ago          NMR:   Nustep L 3 x 4 min (unbilled) Standing on AIREX, mini squat x 10 Standing on AIREX balance x 1 min, progressed to AP/ SS weight shift x 20 each way Standing NBOS on floor EO/EC 1 min x 2  pt requires CGA to min A for safety on balance exercises    Therex: TKE with ball on wall 5 s hold x 15 each side Standing hip abduction x 10 each side  Standing march 2 x 10 with ankle DF  Fwd/retro walking in // bars (1UE fwd/ BUE retro) x 3 laps  Side stepping with light UE support x 3 laps in // bars Fwd step up with 4in step  Pt requires min  verbal and tactile cues for proper exercise performance                            PT Long Term Goals - 02/09/16 1300    PT LONG TERM GOAL #1   Title Pt will be independent with HEP in order to improve strength/balance so she can resume full function at home and with her academic studies   Time 6   Period Weeks   Status New   PT LONG TERM GOAL #2   Title Pt will decrease 5TSTS to <15 seconds in order to demonstrate improved LE strength   Baseline 02/09/16: 27.66 seconds   Time 6   Period Weeks   Status New   PT LONG TERM GOAL #3   Title Pt will improve gait speed by at least 0.13 m/s in order to demonstrate clinically significant improvement in gait speed   Baseline 02/09/16: 0.27 m/s   Time 6   Period Weeks   Status New   PT LONG TERM GOAL #4   Title Pt will improve TUG by at least 5 seconds in order to demonstrates improvement in strength and balance   Baseline 02/09/16: 47.34 seconds   Time 6   Period Weeks   Status New               Plan - 02/28/16 1133    Clinical Impression Statement pt did well with therex today, no increased pain. she seems to have some difficulty with body awareness/proprioception needing VC and min A to correct her balance. pt asked about core strengthening, PT deferred to GI doctor to clear her for this due to open wound.    Rehab Potential Good   Clinical Impairments Affecting Rehab Potential Positive: motivation, age, minimal comorbid conditions: Negative: prolonged bedrest/debility, poor balance   PT Frequency 2x / week   PT Duration 6 weeks   PT Treatment/Interventions ADLs/Self Care Home Management;Aquatic Therapy;Electrical Stimulation;Iontophoresis 4mg /ml Dexamethasone;Moist Heat;Traction;Ultrasound;DME Instruction;Gait training;Stair training;Functional mobility training;Therapeutic activities;Therapeutic exercise;Balance training;Neuromuscular re-education;Patient/family education;Manual techniques;Energy conservation    PT Next Visit Plan BERG or DGI, 6MWT, progress strengthening and balance   PT Home Exercise Plan Sit to stand without UE support from elevated surface, standing mini squats inside of walker   Consulted and Agree with Plan of Care Patient      Patient will benefit from skilled therapeutic intervention in order to improve the following deficits and impairments:  Abnormal gait, Decreased strength, Difficulty walking, Pain, Impaired tone, Decreased balance, Decreased endurance  Visit Diagnosis: Muscle weakness (generalized)     Problem List Patient Active Problem List   Diagnosis Date Noted  . RUQ pain  01/17/2016  . Abdominal pain, RUQ 01/16/2016  . Abdominal pain, epigastric 01/16/2016   Gorden Harms. Saraiah Bhat, PT, DPT [phone]  Geroge Gilliam 02/28/2016, 11:35 AM  [place] MAIN [facility] SERVICES [address] [place], [place], [address] Phone: [phone]   Fax:  [phone]  Name: Jane Jackson MRN: [id number] Date of Birth: Sep 28, 1985

## 2016-03-01 ENCOUNTER — Ambulatory Visit: Payer: BLUE CROSS/BLUE SHIELD

## 2016-03-01 DIAGNOSIS — M6281 Muscle weakness (generalized): Secondary | ICD-10-CM | POA: Diagnosis not present

## 2016-03-01 DIAGNOSIS — R2689 Other abnormalities of gait and mobility: Secondary | ICD-10-CM

## 2016-03-01 NOTE — Therapy (Signed)
[place] MAIN [facility] SERVICES [address] [place], [place], [address] Phone: [phone]   Fax:  [phone]  Physical Therapy Treatment  Patient Details  Name: Jane Jackson MRN: [id number] Date of Birth: 09/29/1985 Referring Provider: Michael Boston  Encounter Date: 03/01/2016      PT End of Session - 03/01/16 0955    Visit Number (p) 5   Number of Visits (p) 13   Date for PT Re-Evaluation (p) 03/22/16   PT Start Time (p) 0915   PT Stop Time (p) 0950   PT Time Calculation (min) (p) 35 min   Equipment Utilized During Treatment (p) Gait belt   Activity Tolerance (p) Patient tolerated treatment well   Behavior During Therapy (p) WFL for tasks assessed/performed      Past Medical History  Diagnosis Date  . Chicken pox   . Asthma   . Kidney stones   . Diverticulitis   . Impaction, bowel ([place])   . Anxiety   . Complication of anesthesia     issue with asthma and intubation    Past Surgical History  Procedure Laterality Date  . Appendectomy    . Tonsillectomy and adenoidectomy      age 30  . Ruptured cyst    . Tympanostomy tube placement    . Cholecystectomy N/A 01/18/2016    Procedure: LAPAROSCOPIC CHOLECYSTECTOMY WITH INTRAOPERATIVE CHOLANGIOGRAM;  Surgeon: Excell Seltzer, MD;  Location: [facility];  Service: General;  Laterality: N/A;    There were no vitals filed for this visit.      Subjective Assessment - 03/01/16 0951    Subjective pt reports she feels like shes getting stronger.    Pertinent History Pt reports she went to [facility] ED on 12/26/15 for abdominal pain and was hospitalized for cholecystitis. She was scheduled for gallbladder removal the following day. Prior to the surgery she had an abdominal CT which revealed diverticulitis. Her surgery was postponed in order to treat her for this new finding. Pt reports she stayed at [facility] for almost1 month and had multiple complications including bowel impaction, PICC line placement  with infiltration, and superficial venous thrombi in her L arm and R forearm. Pt was instructed not to perform blood pressures on her L arm. She is not currently on any anticoagulants and reports that her PCP is not concerned about the thrombi. Pt reports that she eventually left [facility] AMA because "they almost killed me," and was admitted directly to [facility] by her PCP. She underwent cholecystectomy on 01/18/16 and was discharged two days later on 01/20/16. Pt has a post-operative follow-up appointment this Friday 02/10/16 with her surgeon. Since the surgery her main gallbladder incision has re-opened and she has been packing the wound. She has had multiple appointments with the surgeon to assess the wound. Pt was referred to physical therapy for deconditioning. She reports LE weakness and impaired balance. She has suffered 3 falls since the surgery from loss of balance and LE weakness. Pt reports that she struggles with her weight but was very active prior to surgery. 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 She also reports running multiple miles at a time. Pt was recently married in December 2016 and has worked in the past as a Audiological scientist.    Patient Stated Goals return to PLOF, very active lifestyle   Currently in Pain? Yes   Pain Score 2    Pain Location Abdomen   Pain Orientation Upper  Pain Onset 1 to 4 weeks ago       therex:  Nustep LE only x 4 min (unbilled) L 2  TKE green band x 20 each leg  Standing resisted hip flexion, abduction, and extension with     yellow   Band x 10 each way (BLE) Staggered stance AP weight shift on small rocker board x 20 each side SBA for safety Fwd/side / retro lunge x 5 each leg, each side  Pt requires min-mod  verbal and tactile cues for proper exercise performance                           PT Education - 03/01/16 0953    Education provided Yes   Education Details minimizing exercise muscle  substitution    Person(s) Educated Patient   Methods Explanation   Comprehension Verbalized understanding             PT Long Term Goals - 02/09/16 1300    PT LONG TERM GOAL #1   Title Pt will be independent with HEP in order to improve strength/balance so she can resume full function at home and with her academic studies   Time 6   Period Weeks   Status New   PT LONG TERM GOAL #2   Title Pt will decrease 5TSTS to <15 seconds in order to demonstrate improved LE strength   Baseline 02/09/16: 27.66 seconds   Time 6   Period Weeks   Status New   PT LONG TERM GOAL #3   Title Pt will improve gait speed by at least 0.13 m/s in order to demonstrate clinically significant improvement in gait speed   Baseline 02/09/16: 0.27 m/s   Time 6   Period Weeks   Status New   PT LONG TERM GOAL #4   Title Pt will improve TUG by at least 5 seconds in order to demonstrates improvement in strength and balance   Baseline 02/09/16: 47.34 seconds   Time 6   Period Weeks   Status New               Plan - 03/01/16 1117    Clinical Impression Statement pt did well with progression of LE strengthening and balance exercises today. pts movements today were more fluid and a little faster as well. pt arrived 15 min late for apt, treatment time adjusted appropriately.    Rehab Potential Good   Clinical Impairments Affecting Rehab Potential Positive: motivation, age, minimal comorbid conditions: Negative: prolonged bedrest/debility, poor balance   PT Frequency 2x / week   PT Duration 6 weeks   PT Treatment/Interventions ADLs/Self Care Home Management;Aquatic Therapy;Electrical Stimulation;Iontophoresis 4mg /ml Dexamethasone;Moist Heat;Traction;Ultrasound;DME Instruction;Gait training;Stair training;Functional mobility training;Therapeutic activities;Therapeutic exercise;Balance training;Neuromuscular re-education;Patient/family education;Manual techniques;Energy conservation   PT Next Visit Plan BERG or  DGI, 6MWT, progress strengthening and balance   PT Home Exercise Plan Sit to stand without UE support from elevated surface, standing mini squats inside of walker   Consulted and Agree with Plan of Care Patient      Patient will benefit from skilled therapeutic intervention in order to improve the following deficits and impairments:  Abnormal gait, Decreased strength, Difficulty walking, Pain, Impaired tone, Decreased balance, Decreased endurance  Visit Diagnosis: Muscle weakness (generalized)  Other abnormalities of gait and mobility     Problem List Patient Active Problem List   Diagnosis Date Noted  . RUQ pain 01/17/2016  . Abdominal pain, RUQ 01/16/2016  . Abdominal  pain, epigastric 01/16/2016   Gorden Harms. Naira Standiford, PT, DPT [phone]  Amyjo Mizrachi 03/01/2016, 11:19 AM  [place] MAIN [facility] SERVICES [address] [place], [place], [address] Phone: [phone]   Fax:  [phone]  Name: Joselene Weisfeld MRN: [id number] Date of Birth: 1985-10-21

## 2016-03-06 ENCOUNTER — Ambulatory Visit: Payer: BLUE CROSS/BLUE SHIELD

## 2016-03-06 DIAGNOSIS — M6281 Muscle weakness (generalized): Secondary | ICD-10-CM

## 2016-03-06 DIAGNOSIS — R2689 Other abnormalities of gait and mobility: Secondary | ICD-10-CM

## 2016-03-06 NOTE — Therapy (Signed)
[place] MAIN [facility] SERVICES [address] [place], [place], [address] Phone: [phone]   Fax:  [phone]  Physical Therapy Treatment  Patient Details  Name: Jane Jackson MRN: [id number] Date of Birth: June 26, 1986 Referring Provider: Michael Boston  Encounter Date: 03/06/2016      PT End of Session - 03/06/16 1147    Visit Number 6   Number of Visits 13   Date for PT Re-Evaluation 03/22/16   PT Start Time 1055   PT Stop Time 1140   PT Time Calculation (min) 45 min   Equipment Utilized During Treatment Gait belt   Activity Tolerance Patient tolerated treatment well   Behavior During Therapy [facility] for tasks assessed/performed      Past Medical History  Diagnosis Date  . Chicken pox   . Asthma   . Kidney stones   . Diverticulitis   . Impaction, bowel ([place])   . Anxiety   . Complication of anesthesia     issue with asthma and intubation    Past Surgical History  Procedure Laterality Date  . Appendectomy    . Tonsillectomy and adenoidectomy      age 30  . Ruptured cyst    . Tympanostomy tube placement    . Cholecystectomy N/A 01/18/2016    Procedure: LAPAROSCOPIC CHOLECYSTECTOMY WITH INTRAOPERATIVE CHOLANGIOGRAM;  Surgeon: Excell Seltzer, MD;  Location: [facility];  Service: General;  Laterality: N/A;    There were no vitals filed for this visit.   THEREX Nustep x 5 min LE only, unbilled Mini squat with TKE with GTB, 3 x 10 each; min verbal cues for proper technique Side stepping with YTB x 3 laps in // bars  NMR Staggered stance with LE elevated on 6" step and ball toss, 2 x 15 BLE;  CGA - min A throughout to maintain balance; more difficulty with RLE back bil stance on 1/2 foam roll ([place]) 5 x 30 sec; SBA Tandem stance on 1/2 foam roll ([place]), 5 x 30 sec; CGA; more difficulty with RLE back bil staggered stance on small rocker board with D2 UE flexion with cones (x 4), x 5 on each side; CGA         PT  Education - 03/06/16 1147    Education provided Yes   Education Details exercise technique; POC   Person(s) Educated Patient   Methods Explanation   Comprehension Verbalized understanding             PT Long Term Goals - 02/09/16 1300    PT LONG TERM GOAL #1   Title Pt will be independent with HEP in order to improve strength/balance so she can resume full function at home and with her academic studies   Time 6   Period Weeks   Status New   PT LONG TERM GOAL #2   Title Pt will decrease 5TSTS to <15 seconds in order to demonstrate improved LE strength   Baseline 02/09/16: 27.66 seconds   Time 6   Period Weeks   Status New   PT LONG TERM GOAL #3   Title Pt will improve gait speed by at least 0.13 m/s in order to demonstrate clinically significant improvement in gait speed   Baseline 02/09/16: 0.27 m/s   Time 6   Period Weeks   Status New   PT LONG TERM GOAL #4   Title Pt will improve TUG by at least 5 seconds in order to demonstrates improvement in strength and balance  Baseline 02/09/16: 47.34 seconds   Time 6   Period Weeks   Status New               Plan - 03/06/16 1148    Clinical Impression Statement pt continues to make progress towards goals. she was able to tolerate progressed balance and strengthening exercises well. she demonstrates more weakness and balance deficits in RLE > LLE as evidenced by increased instability when weight is shifted to the R during balance exercises. she expressed interest in attempting to ambulate with a cane next session because she feels she is not relying on the FWW as much during the day. pt needs continued skilled intervention to address remaining deficits in order to maximize independence and overall function.   Rehab Potential Good   Clinical Impairments Affecting Rehab Potential Positive: motivation, age, minimal comorbid conditions: Negative: prolonged bedrest/debility, poor balance   PT Frequency 2x / week   PT Duration 6  weeks   PT Treatment/Interventions ADLs/Self Care Home Management;Aquatic Therapy;Electrical Stimulation;Iontophoresis 4mg /ml Dexamethasone;Moist Heat;Traction;Ultrasound;DME Instruction;Gait training;Stair training;Functional mobility training;Therapeutic activities;Therapeutic exercise;Balance training;Neuromuscular re-education;Patient/family education;Manual techniques;Energy conservation   PT Next Visit Plan progress strengthening and balance exercises; ambulate with quad cane   PT Home Exercise Plan Sit to stand without UE support from elevated surface, standing mini squats inside of walker   Consulted and Agree with Plan of Care Patient      Patient will benefit from skilled therapeutic intervention in order to improve the following deficits and impairments:  Abnormal gait, Decreased strength, Difficulty walking, Pain, Impaired tone, Decreased balance, Decreased endurance  Visit Diagnosis: Muscle weakness (generalized)  Other abnormalities of gait and mobility     Problem List Patient Active Problem List   Diagnosis Date Noted  . RUQ pain 01/17/2016  . Abdominal pain, RUQ 01/16/2016  . Abdominal pain, epigastric 01/16/2016   Geraldine Solar, SPT  This entire session was performed under direct supervision and direction of a licensed therapist/therapist assistant . I have personally read, edited and approve of the note as written. Gorden Harms. Tortorici, PT, DPT [phone]  Tortorici,Ashley 03/06/2016, 12:10 PM  [place] MAIN [facility] SERVICES [address] [place], [place], [address] Phone: [phone]   Fax:  [phone]  Name: Jane Jackson MRN: [id number] Date of Birth: 11-26-1985

## 2016-03-08 ENCOUNTER — Ambulatory Visit: Payer: BLUE CROSS/BLUE SHIELD

## 2016-03-13 ENCOUNTER — Ambulatory Visit: Payer: BLUE CROSS/BLUE SHIELD

## 2016-03-13 DIAGNOSIS — R2689 Other abnormalities of gait and mobility: Secondary | ICD-10-CM

## 2016-03-13 DIAGNOSIS — M6281 Muscle weakness (generalized): Secondary | ICD-10-CM | POA: Diagnosis not present

## 2016-03-13 NOTE — Therapy (Signed)
[place] MAIN [facility] SERVICES [address] [place], [place], [address] Phone: [phone]   Fax:  [phone]  Physical Therapy Treatment  Patient Details  Name: Jane Jackson MRN: [id number] Date of Birth: 09-Apr-1986 Referring Provider: Michael Boston  Encounter Date: 03/13/2016      PT End of Session - 03/13/16 1130    Visit Number 7   Number of Visits 13   Date for PT Re-Evaluation 03/22/16   PT Start Time 1125   PT Stop Time 1200   PT Time Calculation (min) 35 min   Equipment Utilized During Treatment Gait belt   Activity Tolerance Patient tolerated treatment well   Behavior During Therapy [facility] for tasks assessed/performed      Past Medical History  Diagnosis Date  . Chicken pox   . Asthma   . Kidney stones   . Diverticulitis   . Impaction, bowel ([place])   . Anxiety   . Complication of anesthesia     issue with asthma and intubation    Past Surgical History  Procedure Laterality Date  . Appendectomy    . Tonsillectomy and adenoidectomy      age 30  . Ruptured cyst    . Tympanostomy tube placement    . Cholecystectomy N/A 01/18/2016    Procedure: LAPAROSCOPIC CHOLECYSTECTOMY WITH INTRAOPERATIVE CHOLANGIOGRAM;  Surgeon: Excell Seltzer, MD;  Location: [facility];  Service: General;  Laterality: N/A;    There were no vitals filed for this visit.      Subjective Assessment - 03/13/16 1127    Subjective pt reports feeling tired today and states that she was tired and weak after last treatment session. pt reports it was hard to walk the next day due to feeling exhausted.   Pertinent History Pt reports she went to [facility] ED on 12/26/15 for abdominal pain and was hospitalized for cholecystitis. She was scheduled for gallbladder removal the following day. Prior to the surgery she had an abdominal CT which revealed diverticulitis. Her surgery was postponed in order to treat her for this new finding. Pt reports she stayed at [facility] for almost1  month and had multiple complications including bowel impaction, PICC line placement with infiltration, and superficial venous thrombi in her L arm and R forearm. Pt was instructed not to perform blood pressures on her L arm. She is not currently on any anticoagulants and reports that her PCP is not concerned about the thrombi. Pt reports that she eventually left [facility] AMA because "they almost killed me," and was admitted directly to [facility] by her PCP. She underwent cholecystectomy on 01/18/16 and was discharged two days later on 01/20/16. Pt has a post-operative follow-up appointment this Friday 02/10/16 with her surgeon. Since the surgery her main gallbladder incision has re-opened and she has been packing the wound. She has had multiple appointments with the surgeon to assess the wound. Pt was referred to physical therapy for deconditioning. She reports LE weakness and impaired balance. She has suffered 3 falls since the surgery from loss of balance and LE weakness. Pt reports that she struggles with her weight but was very active prior to surgery. 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 She also reports running multiple miles at a time. Pt was recently married in December 2016 and has worked in the past as a Audiological scientist.    Patient Stated Goals return to PLOF, very active lifestyle   Pain Score 2    Pain  Location Abdomen   Pain Descriptors / Indicators Sore   Pain Onset 1 to 4 weeks ago      THEREX Nustep x 5 min, LE only, L2-3 (unbilled) TKE with ball on wall, 2x_10 with 5 sec hold Mini squats with TKE with GTB around knee, 1 x 12 each bil staggered stance with front foot on dynadisc with D2 BUE shoulder flexion with RTB, 1x10 each; min cues for proper exercise technique  GAIT TRAINING gait training with quad cane 50 ft x 2; min cues for sequencing; increased unsteadiness but no LOB; CGA throughout           PT Education - 03/13/16 1129    Education  provided Yes   Education Details gait training with cane, exercise technique, POC   Person(s) Educated Patient   Methods Explanation   Comprehension Verbalized understanding             PT Long Term Goals - 02/09/16 1300    PT LONG TERM GOAL #1   Title Pt will be independent with HEP in order to improve strength/balance so she can resume full function at home and with her academic studies   Time 6   Period Weeks   Status New   PT LONG TERM GOAL #2   Title Pt will decrease 5TSTS to <15 seconds in order to demonstrate improved LE strength   Baseline 02/09/16: 27.66 seconds   Time 6   Period Weeks   Status New   PT LONG TERM GOAL #3   Title Pt will improve gait speed by at least 0.13 m/s in order to demonstrate clinically significant improvement in gait speed   Baseline 02/09/16: 0.27 m/s   Time 6   Period Weeks   Status New   PT LONG TERM GOAL #4   Title Pt will improve TUG by at least 5 seconds in order to demonstrates improvement in strength and balance   Baseline 02/09/16: 47.34 seconds   Time 6   Period Weeks   Status New               Plan - 03/13/16 1210    Clinical Impression Statement pt making progress towards goals as evidenced by improved balance with exercise and gait. pt able to ambulate with quad cane today with no LOB, however, pt was more unsteady throughout gait. pt educated to contact a nutritionist regarding her diet as she reported only being able to eat watermelon and cucumber prior to coming to therapy sessions. pt also expresesd frustration with herself regarding her progress. pt still demonstrates weakness and lack of control of bil quads during gait and exercises. pt needs continued skilled PT intervention to address remaining deficits in order to improve overall function and decrease risk of falls.   Rehab Potential Good   Clinical Impairments Affecting Rehab Potential Positive: motivation, age, minimal comorbid conditions: Negative: prolonged  bedrest/debility, poor balance   PT Frequency 2x / week   PT Duration 6 weeks   PT Treatment/Interventions ADLs/Self Care Home Management;Aquatic Therapy;Electrical Stimulation;Iontophoresis 4mg /ml Dexamethasone;Moist Heat;Traction;Ultrasound;DME Instruction;Gait training;Stair training;Functional mobility training;Therapeutic activities;Therapeutic exercise;Balance training;Neuromuscular re-education;Patient/family education;Manual techniques;Energy conservation   PT Next Visit Plan progress strengthening and balance exercises; ambulate with quad cane   PT Home Exercise Plan Sit to stand without UE support from elevated surface, standing mini squats inside of walker   Consulted and Agree with Plan of Care Patient      Patient will benefit from skilled therapeutic intervention in order to improve  the following deficits and impairments:  Abnormal gait, Decreased strength, Difficulty walking, Pain, Impaired tone, Decreased balance, Decreased endurance  Visit Diagnosis: Muscle weakness (generalized)  Other abnormalities of gait and mobility     Problem List Patient Active Problem List   Diagnosis Date Noted  . RUQ pain 01/17/2016  . Abdominal pain, RUQ 01/16/2016  . Abdominal pain, epigastric 01/16/2016   Geraldine Solar, SPT This entire session was performed under direct supervision and direction of a licensed therapist/therapist assistant . I have personally read, edited and approve of the note as written. Gorden Harms. Tortorici, PT, DPT [phone]   Tortorici,Ashley 03/13/2016, 1:53 PM  [place] [facility] MAIN [facility] SERVICES [address] [place], [place], [address] Phone: [phone]   Fax:  [phone]  Name: Jane Jackson MRN: [id number] Date of Birth: 25-May-1986

## 2016-03-15 ENCOUNTER — Ambulatory Visit: Payer: BLUE CROSS/BLUE SHIELD

## 2016-03-15 DIAGNOSIS — M6281 Muscle weakness (generalized): Secondary | ICD-10-CM | POA: Diagnosis not present

## 2016-03-15 NOTE — Therapy (Signed)
[place] MAIN [facility] SERVICES [address] [place], [place], [address] Phone: [phone]   Fax:  [phone]  Physical Therapy Treatment  Patient Details  Name: Jane Jackson MRN: [id number] Date of Birth: Dec 30, 1985 Referring Provider: Michael Boston  Encounter Date: 03/15/2016      PT End of Session - 03/15/16 1250    Visit Number 8   Number of Visits 13   Date for PT Re-Evaluation 03/22/16   PT Start Time 1118   PT Stop Time 1200   PT Time Calculation (min) 42 min   Equipment Utilized During Treatment Gait belt   Activity Tolerance Patient tolerated treatment well   Behavior During Therapy [facility] for tasks assessed/performed      Past Medical History  Diagnosis Date  . Chicken pox   . Asthma   . Kidney stones   . Diverticulitis   . Impaction, bowel ([place])   . Anxiety   . Complication of anesthesia     issue with asthma and intubation    Past Surgical History  Procedure Laterality Date  . Appendectomy    . Tonsillectomy and adenoidectomy      age 30  . Ruptured cyst    . Tympanostomy tube placement    . Cholecystectomy N/A 01/18/2016    Procedure: LAPAROSCOPIC CHOLECYSTECTOMY WITH INTRAOPERATIVE CHOLANGIOGRAM;  Surgeon: Excell Seltzer, MD;  Location: [facility];  Service: General;  Laterality: N/A;    There were no vitals filed for this visit.      Subjective Assessment - 03/15/16 1239    Subjective pt reports she has not get called GI or nutritionist despite PT encouragement.    Pertinent History Pt reports she went to [facility] ED on 12/26/15 for abdominal pain and was hospitalized for cholecystitis. She was scheduled for gallbladder removal the following day. Prior to the surgery she had an abdominal CT which revealed diverticulitis. Her surgery was postponed in order to treat her for this new finding. Pt reports she stayed at [facility] for almost1 month and had multiple complications including bowel impaction, PICC line placement with  infiltration, and superficial venous thrombi in her L arm and R forearm. Pt was instructed not to perform blood pressures on her L arm. She is not currently on any anticoagulants and reports that her PCP is not concerned about the thrombi. Pt reports that she eventually left [facility] AMA because "they almost killed me," and was admitted directly to [facility] by her PCP. She underwent cholecystectomy on 01/18/16 and was discharged two days later on 01/20/16. Pt has a post-operative follow-up appointment this Friday 02/10/16 with her surgeon. Since the surgery her main gallbladder incision has re-opened and she has been packing the wound. She has had multiple appointments with the surgeon to assess the wound. Pt was referred to physical therapy for deconditioning. She reports LE weakness and impaired balance. She has suffered 3 falls since the surgery from loss of balance and LE weakness. Pt reports that she struggles with her weight but was very active prior to surgery. 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 She also reports running multiple miles at a time. Pt was recently married in December 2016 and has worked in the past as a Audiological scientist.    Patient Stated Goals return to PLOF, very active lifestyle   Currently in Pain? No/denies   Pain Onset 1 to 4 weeks ago     thereX: Mini squat with 5s hold x  10 no UE Side stepping in // bars red band x 5 laps Standing resisted hip flexion, abduction , and extension with    red    Band x 10 each way no UE 2 x 10 m ambulation with quad cane, mod cues for foot clearance, sequencing and step through pattern with larger stride length, cga for safety NMR: Toe taps on AIREX onto box 2 x 20 fwd no UE Toe taps on AIREX onto box 1 x 20 each side no UE Standing on AIREX EO/EC 10s  x10  pt requires CGA to min A for safety on balance exercises                              PT Education - 03/15/16 1250    Education  provided Yes   Education Details sequencing with quad cane    Person(s) Educated Patient   Methods Explanation   Comprehension Verbalized understanding;Verbal cues required;Need further instruction             PT Long Term Goals - 02/09/16 1300    PT LONG TERM GOAL #1   Title Pt will be independent with HEP in order to improve strength/balance so she can resume full function at home and with her academic studies   Time 6   Period Weeks   Status New   PT LONG TERM GOAL #2   Title Pt will decrease 5TSTS to <15 seconds in order to demonstrate improved LE strength   Baseline 02/09/16: 27.66 seconds   Time 6   Period Weeks   Status New   PT LONG TERM GOAL #3   Title Pt will improve gait speed by at least 0.13 m/s in order to demonstrate clinically significant improvement in gait speed   Baseline 02/09/16: 0.27 m/s   Time 6   Period Weeks   Status New   PT LONG TERM GOAL #4   Title Pt will improve TUG by at least 5 seconds in order to demonstrates improvement in strength and balance   Baseline 02/09/16: 47.34 seconds   Time 6   Period Weeks   Status New               Plan - 03/15/16 1545    Clinical Impression Statement pt shows good improvement in exercise tolerance even able to perform resisted 3 way hip without UE support requiring prolonged SLS, however still is very unsteady during gait with quad cane with poor foot clearance and poor knee control which is inconsistant with her ability to do the 3 way hip. Overall she did do better walking with the quad cane however, but not safe enough for home use yet. pt woudl benefit from continued skilled PT serivces to progerss strengthening, balance and mobility as tolerated.    Rehab Potential Good   Clinical Impairments Affecting Rehab Potential Positive: motivation, age, minimal comorbid conditions: Negative: prolonged bedrest/debility, poor balance   PT Frequency 2x / week   PT Duration 6 weeks   PT Treatment/Interventions  ADLs/Self Care Home Management;Aquatic Therapy;Electrical Stimulation;Iontophoresis 4mg /ml Dexamethasone;Moist Heat;Traction;Ultrasound;DME Instruction;Gait training;Stair training;Functional mobility training;Therapeutic activities;Therapeutic exercise;Balance training;Neuromuscular re-education;Patient/family education;Manual techniques;Energy conservation   PT Next Visit Plan progress strengthening and balance exercises; ambulate with quad cane   PT Home Exercise Plan Sit to stand without UE support from elevated surface, standing mini squats inside of walker   Consulted and Agree with Plan of Care Patient  Patient will benefit from skilled therapeutic intervention in order to improve the following deficits and impairments:  Abnormal gait, Decreased strength, Difficulty walking, Pain, Impaired tone, Decreased balance, Decreased endurance  Visit Diagnosis: Muscle weakness (generalized)     Problem List Patient Active Problem List   Diagnosis Date Noted  . RUQ pain 01/17/2016  . Abdominal pain, RUQ 01/16/2016  . Abdominal pain, epigastric 01/16/2016   Gorden Harms. Rikki Smestad, PT, DPT [phone]  Milderd Manocchio 03/15/2016, 3:50 PM  [place] MAIN [facility] SERVICES [address] [place], [place], [address] Phone: [phone]   Fax:  [phone]  Name: Jakeira Mcmurrey MRN: [id number] Date of Birth: March 06, 1986

## 2016-03-20 ENCOUNTER — Ambulatory Visit: Payer: BLUE CROSS/BLUE SHIELD

## 2016-03-20 DIAGNOSIS — M6281 Muscle weakness (generalized): Secondary | ICD-10-CM | POA: Diagnosis not present

## 2016-03-20 DIAGNOSIS — R2689 Other abnormalities of gait and mobility: Secondary | ICD-10-CM

## 2016-03-20 NOTE — Therapy (Signed)
[place] MAIN [facility] SERVICES [address] [place], [place], [address] Phone: [phone]   Fax:  [phone]  Physical Therapy Treatment  Patient Details  Name: Jane Jackson MRN: [id number] Date of Birth: 1986/03/19 Referring Provider: Michael Boston  Encounter Date: 03/20/2016      PT End of Session - 03/20/16 1342    Visit Number 9   Number of Visits 13   Date for PT Re-Evaluation 03/22/16   PT Start Time 1118   PT Stop Time 1200   PT Time Calculation (min) 42 min   Equipment Utilized During Treatment Gait belt   Activity Tolerance Patient tolerated treatment well   Behavior During Therapy [facility] for tasks assessed/performed      Past Medical History  Diagnosis Date  . Chicken pox   . Asthma   . Kidney stones   . Diverticulitis   . Impaction, bowel ([place])   . Anxiety   . Complication of anesthesia     issue with asthma and intubation    Past Surgical History  Procedure Laterality Date  . Appendectomy    . Tonsillectomy and adenoidectomy      age 74  . Ruptured cyst    . Tympanostomy tube placement    . Cholecystectomy N/A 01/18/2016    Procedure: LAPAROSCOPIC CHOLECYSTECTOMY WITH INTRAOPERATIVE CHOLANGIOGRAM;  Surgeon: Excell Seltzer, MD;  Location[facility];  Service: General;  Laterality: N/A;    There were no vitals filed for this visit.      Subjective Assessment - 03/20/16 1340    Subjective pt reports she feels like she is in between a walker and a cane regading her balance and gait   Pertinent History Pt reports she went to [facility] ED on 12/26/15 for abdominal pain and was hospitalized for cholecystitis. She was scheduled for gallbladder removal the following day. Prior to the surgery she had an abdominal CT which revealed diverticulitis. Her surgery was postponed in order to treat her for this new finding. Pt reports she stayed at [facility] for almost1 month and had multiple complications including bowel impaction, PICC line  placement with infiltration, and superficial venous thrombi in her L arm and R forearm. Pt was instructed not to perform blood pressures on her L arm. She is not currently on any anticoagulants and reports that her PCP is not concerned about the thrombi. Pt reports that she eventually left [facility] AMA because "they almost killed me," and was admitted directly to [facility] by her PCP. She underwent cholecystectomy on 01/18/16 and was discharged two days later on 01/20/16. Pt has a post-operative follow-up appointment this Friday 02/10/16 with her surgeon. Since the surgery her main gallbladder incision has re-opened and she has been packing the wound. She has had multiple appointments with the surgeon to assess the wound. Pt was referred to physical therapy for deconditioning. She reports LE weakness and impaired balance. She has suffered 3 falls since the surgery from loss of balance and LE weakness. Pt reports that she struggles with her weight but was very active prior to surgery. 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 She also reports running multiple miles at a time. Pt was recently married in December 2016 and has worked in the past as a Audiological scientist.    Patient Stated Goals return to PLOF, very active lifestyle   Currently in Pain? No/denies   Pain Onset 1 to 4 weeks ago     Therex: [employer]  squat on wall 2 x 10 Fwd/side lunge on BOSU 2 x 10 each way BLE 1 foot on AIREX 1 foot on box with yellow med ball chop 2 x 10 each side Standing on AIREX T ball bounce 2 x 20 fwd 1 x 20 each side Side stepping in // bars with resistance red band, held 6in from waist x 4 laps L and R Pt requires min verbal and tactile cues for proper exercise performance    pt requires CGA for safety on balance exercises                              PT Education - 03/20/16 1341    Education provided Yes   Education Details lighter touch on walker   Person(s) Educated  Patient   Methods Explanation   Comprehension Verbalized understanding             PT Long Term Goals - 02/09/16 1300    PT LONG TERM GOAL #1   Title Pt will be independent with HEP in order to improve strength/balance so she can resume full function at home and with her academic studies   Time 6   Period Weeks   Status New   PT LONG TERM GOAL #2   Title Pt will decrease 5TSTS to <15 seconds in order to demonstrate improved LE strength   Baseline 02/09/16: 27.66 seconds   Time 6   Period Weeks   Status New   PT LONG TERM GOAL #3   Title Pt will improve gait speed by at least 0.13 m/s in order to demonstrate clinically significant improvement in gait speed   Baseline 02/09/16: 0.27 m/s   Time 6   Period Weeks   Status New   PT LONG TERM GOAL #4   Title Pt will improve TUG by at least 5 seconds in order to demonstrates improvement in strength and balance   Baseline 02/09/16: 47.34 seconds   Time 6   Period Weeks   Status New               Plan - 03/20/16 1342    Clinical Impression Statement pt progressing gradually in PT regarding her LE strength and balance. she is also having core strengtheing incorporated in her treatment sessions which has not caused pain or other issues GI wise. will assess progress/outcome measures next visit.    Rehab Potential Good   Clinical Impairments Affecting Rehab Potential Positive: motivation, age, minimal comorbid conditions: Negative: prolonged bedrest/debility, poor balance   PT Frequency 2x / week   PT Duration 6 weeks   PT Treatment/Interventions ADLs/Self Care Home Management;Aquatic Therapy;Electrical Stimulation;Iontophoresis 4mg /ml Dexamethasone;Moist Heat;Traction;Ultrasound;DME Instruction;Gait training;Stair training;Functional mobility training;Therapeutic activities;Therapeutic exercise;Balance training;Neuromuscular re-education;Patient/family education;Manual techniques;Energy conservation   PT Next Visit Plan progress  strengthening and balance exercises; ambulate with quad cane   PT Home Exercise Plan Sit to stand without UE support from elevated surface, standing mini squats inside of walker   Consulted and Agree with Plan of Care Patient      Patient will benefit from skilled therapeutic intervention in order to improve the following deficits and impairments:  Abnormal gait, Decreased strength, Difficulty walking, Pain, Impaired tone, Decreased balance, Decreased endurance  Visit Diagnosis: Muscle weakness (generalized)  Other abnormalities of gait and mobility     Problem List Patient Active Problem List   Diagnosis Date Noted  . RUQ pain 01/17/2016  . Abdominal pain, RUQ 01/16/2016  .  Abdominal pain, epigastric 01/16/2016   Gorden Harms. Shalicia Craghead, PT, DPT [phone]  Edana Aguado 03/20/2016, 1:44 PM  [place] MAIN [facility] SERVICES [address] [place], [place], [address] Phone: [phone]   Fax:  [phone]  Name: Shiley Linz MRN: [id number] Date of Birth: 02/04/1986

## 2016-03-22 ENCOUNTER — Ambulatory Visit: Payer: BLUE CROSS/BLUE SHIELD

## 2016-03-22 DIAGNOSIS — R2689 Other abnormalities of gait and mobility: Secondary | ICD-10-CM

## 2016-03-22 DIAGNOSIS — M6281 Muscle weakness (generalized): Secondary | ICD-10-CM | POA: Diagnosis not present

## 2016-03-22 NOTE — Therapy (Signed)
[place] MAIN [facility] SERVICES [address] [place], [place], [address] Phone: [phone]   Fax:  [phone]  Physical Therapy Treatment/ progress note  Patient Details  Name: Jane Jackson MRN: [id number] Date of Birth: [birth year] Referring Provider: Michael Boston  Encounter Date: 03/22/2016      PT End of Session - 03/22/16 1624    Visit Number 10   Number of Visits 21   Date for PT Re-Evaluation 04/19/16   PT Start Time 1100   PT Stop Time 1145   PT Time Calculation (min) 45 min   Equipment Utilized During Treatment Gait belt   Activity Tolerance Patient tolerated treatment well   Behavior During Therapy [facility] for tasks assessed/performed      Past Medical History  Diagnosis Date  . Chicken pox   . Asthma   . Kidney stones   . Diverticulitis   . Impaction, bowel ([place])   . Anxiety   . Complication of anesthesia     issue with asthma and intubation    Past Surgical History  Procedure Laterality Date  . Appendectomy    . Tonsillectomy and adenoidectomy      age 30  . Ruptured cyst    . Tympanostomy tube placement    . Cholecystectomy N/A 01/18/2016    Procedure: LAPAROSCOPIC CHOLECYSTECTOMY WITH INTRAOPERATIVE CHOLANGIOGRAM;  Surgeon: Excell Seltzer, MD;  Location: [facility];  Service: General;  Laterality: N/A;    There were no vitals filed for this visit.      Subjective Assessment - 03/22/16 1624    Subjective pt reports shes feeling stronger   Pertinent History Pt reports she went to [facility] ED on 12/26/15 for abdominal pain and was hospitalized for cholecystitis. She was scheduled for gallbladder removal the following day. Prior to the surgery she had an abdominal CT which revealed diverticulitis. Her surgery was postponed in order to treat her for this new finding. Pt reports she stayed at [facility] for almost1 month and had multiple complications including bowel impaction, PICC line placement with infiltration, and superficial  venous thrombi in her L arm and R forearm. Pt was instructed not to perform blood pressures on her L arm. She is not currently on any anticoagulants and reports that her PCP is not concerned about the thrombi. Pt reports that she eventually left [facility] AMA because "they almost killed me," and was admitted directly to [facility] by her PCP. She underwent cholecystectomy on 01/18/16 and was discharged two days later on 01/20/16. Pt has a post-operative follow-up appointment this Friday 02/10/16 with her surgeon. Since the surgery her main gallbladder incision has re-opened and she has been packing the wound. She has had multiple appointments with the surgeon to assess the wound. Pt was referred to physical therapy for deconditioning. She reports LE weakness and impaired balance. She has suffered 3 falls since the surgery from loss of balance and LE weakness. Pt reports that she struggles with her weight but was very active prior to surgery. 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 She also reports running multiple miles at a time. Pt was recently married in December 2016 and has worked in the past as a Audiological scientist.    Patient Stated Goals return to PLOF, very active lifestyle   Currently in Pain? No/denies   Pain Onset 1 to 4 weeks ago      Therex: PT reassessed outcome measures" 5x sit to stand : 10s 49mwalk  test: .76ms  Tug: 18s  Standing resisted hip flexion, abduction , and extension with    red   Band x 10 each way  squat with red band around legs  2 x 10 Monster walk fwd/retro red band x 3 laps 184feach SLS with chop on AIREX 2lbs 2 x 10 SL bridge 2 x 10 Pt requires min verbal and tactile cues for proper exercise performance                            PT Education - 03/22/16 1624    Education provided Yes   Education Details progress towards goals, PT POC recommendaton    Person(s) Educated Patient   Methods Explanation    Comprehension Verbalized understanding             PT Long Term Goals - 03/22/16 1628    PT LONG TERM GOAL #1   Title Pt will be independent with HEP in order to improve strength/balance so she can resume full function at home and with her academic studies   Time 6   Period Weeks   Status Achieved   PT LONG TERM GOAL #2   Title Pt will decrease 5TSTS to <15 seconds in order to demonstrate improved LE strength   Baseline 02/09/16: 27.66 seconds   Time 6   Period Weeks   Status Achieved   PT LONG TERM GOAL #3   Title Pt will improve gait speed by at least 0.13 m/s in order to demonstrate clinically significant improvement in gait speed   Baseline 02/09/16: 0.27 m/s   Time 6   Period Weeks   Status Achieved   PT LONG TERM GOAL #4   Title Pt will improve TUG by at least 5 seconds in order to demonstrates improvement in strength and balance   Baseline 02/09/16: 47.34 seconds   Time 6   Period Weeks   Status Achieved   PT LONG TERM GOAL #5   Title pt will walk x [id number] AD safely to return to independent home mobility    Time 4   Period Weeks   Status New   Additional Long Term Goals   Additional Long Term Goals Yes   PT LONG TERM GOAL #6   Title pt will score >20/30 on FGA to reduce fall risk    Time 4   Period Weeks   Status New               Plan - 03/22/16 1626    Clinical Impression Statement pt made signficant progress on outcome measures and has partially met goals at this time. pt would benefit from continued skilled PT services to return to PLOF. pt current insurance is running out, she will be investigating other options. pt will be on hold at this time.    Rehab Potential Good   Clinical Impairments Affecting Rehab Potential Positive: motivation, age, minimal comorbid conditions: Negative: prolonged bedrest/debility, poor balance   PT Frequency 2x / week   PT Duration 6 weeks   PT Treatment/Interventions ADLs/Self Care Home Management;Aquatic  Therapy;Electrical Stimulation;Iontophoresis '4mg'$ /ml Dexamethasone;Moist Heat;Traction;Ultrasound;DME Instruction;Gait training;Stair training;Functional mobility training;Therapeutic activities;Therapeutic exercise;Balance training;Neuromuscular re-education;Patient/family education;Manual techniques;Energy conservation   PT Next Visit Plan progress strengthening and balance exercises; ambulate with quad cane   PT Home Exercise Plan Sit to stand without UE support from elevated surface, standing mini squats inside of walker   Consulted and Agree with Plan of Care  Patient      Patient will benefit from skilled therapeutic intervention in order to improve the following deficits and impairments:  Abnormal gait, Decreased strength, Difficulty walking, Pain, Impaired tone, Decreased balance, Decreased endurance  Visit Diagnosis: Muscle weakness (generalized) - Plan: PT plan of care cert/re-cert  Other abnormalities of gait and mobility - Plan: PT plan of care cert/re-cert     Problem List Patient Active Problem List   Diagnosis Date Noted  . RUQ pain 01/17/2016  . Abdominal pain, RUQ 01/16/2016  . Abdominal pain, epigastric 01/16/2016   Gorden Harms. Christon Gallaway, PT, DPT [phone]  Echo Allsbrook 03/22/2016, 4:31 PM  [place] MAIN [facility] SERVICES [address] [place], [place], [address] Phone: [phone]   Fax:  [phone]  Name: Jane Jackson MRN: [id number] Date of Birth: 08-19-1986

## 2016-03-23 ENCOUNTER — Other Ambulatory Visit: Payer: Self-pay | Admitting: Family Medicine

## 2016-08-08 ENCOUNTER — Other Ambulatory Visit: Payer: Self-pay | Admitting: Obstetrics and Gynecology

## 2016-08-08 ENCOUNTER — Ambulatory Visit (INDEPENDENT_AMBULATORY_CARE_PROVIDER_SITE_OTHER): Payer: Self-pay | Admitting: Obstetrics and Gynecology

## 2016-08-08 VITALS — BP 114/81 | HR 77 | Wt 278.1 lb

## 2016-08-08 DIAGNOSIS — Z113 Encounter for screening for infections with a predominantly sexual mode of transmission: Secondary | ICD-10-CM

## 2016-08-08 DIAGNOSIS — Z6841 Body Mass Index (BMI) 40.0 and over, adult: Secondary | ICD-10-CM

## 2016-08-08 DIAGNOSIS — N926 Irregular menstruation, unspecified: Secondary | ICD-10-CM

## 2016-08-08 DIAGNOSIS — E669 Obesity, unspecified: Secondary | ICD-10-CM

## 2016-08-08 DIAGNOSIS — Z3491 Encounter for supervision of normal pregnancy, unspecified, first trimester: Secondary | ICD-10-CM

## 2016-08-08 DIAGNOSIS — Z1389 Encounter for screening for other disorder: Secondary | ICD-10-CM

## 2016-08-08 DIAGNOSIS — O219 Vomiting of pregnancy, unspecified: Secondary | ICD-10-CM

## 2016-08-08 DIAGNOSIS — Z3490 Encounter for supervision of normal pregnancy, unspecified, unspecified trimester: Secondary | ICD-10-CM

## 2016-08-08 DIAGNOSIS — D259 Leiomyoma of uterus, unspecified: Secondary | ICD-10-CM

## 2016-08-08 DIAGNOSIS — Z369 Encounter for antenatal screening, unspecified: Secondary | ICD-10-CM

## 2016-08-08 DIAGNOSIS — IMO0001 Reserved for inherently not codable concepts without codable children: Secondary | ICD-10-CM

## 2016-08-08 DIAGNOSIS — T7589XA Other specified effects of external causes, initial encounter: Secondary | ICD-10-CM

## 2016-08-08 MED ORDER — PROMETHAZINE HCL 25 MG PO TABS: 25.0000 mg | ORAL_TABLET | Freq: Four times a day (QID) | ORAL | 1 refills | Status: DC | PRN

## 2016-08-08 NOTE — Progress Notes (Signed)
Jane Jackson presents for NOB nurse interview visit. Pregnancy confirmation done at [facility], positive UPT.  G-1  P-0 . Pregnancy education material explained and given. Has cats in the home. Toxoplamosis lab ordered.  NOB labs ordered. TSH/HbgA1c due to Increased BMI, HIV labs and Drug screen were explained optional and she did not decline. Drug screen ordered. PNV encouraged. Genetic screening, to discuss with provider. Pt. And husband had many questions. They had a bad experience at [facility]. Pt. Also has fibroids. Ultrasound for dating and viability ordered due to irregular menses and Fibroids. Pt has had some light spotting and cramping. Did stated that cramping has been worse at times. Phenergan ordered for nausea at pt request. Pt. To follow up with provider in 4 weeks for NOB physical.  All questions answered. Immunization record to be filed in chart.

## 2016-08-08 NOTE — Patient Instructions (Signed)
Pregnancy and Zika Virus Disease Introduction Zika virus disease, or Zika, is an illness that can spread to people from mosquitoes that carry the virus. It may also spread from person to person through infected body fluids. Zika first occurred in [place], but recently it has spread to new areas. The virus occurs in tropical climates. The location of Zika continues to change. Most people who become infected with Zika virus do not develop serious illness. However, Zika may cause birth defects in an unborn baby whose mother is infected with the virus. It may also increase the risk of miscarriage. What are the symptoms of Zika virus disease? In many cases, people who have been infected with Zika virus do not develop any symptoms. If symptoms appear, they usually start about a week after the person is infected. Symptoms are usually mild. They may include:  Fever.  Rash.  Red eyes.  Joint pain. How does Zika virus disease spread? The main way that [place] virus spreads is through the bite of a certain type of mosquito. Unlike most types of mosquitos, which bite only at night, the type of mosquito that carries Zika virus bites both at night and during the day. Zika virus can also spread through sexual contact, through a blood transfusion, and from a mother to her baby before or during birth. Once you have had Zika virus disease, it is unlikely that you will get it again. Can I pass Zika to my baby during pregnancy? Yes, Zika can pass from a mother to her baby before or during birth. What problems can Zika cause for my baby? A woman who is infected with Zika virus while pregnant is at risk of having her baby born with a condition in which the brain or head is smaller than expected (microcephaly). Babies who have microcephaly can have developmental delays, seizures, hearing problems, and vision problems. Having Zika virus disease during pregnancy can also increase the risk of miscarriage. How can Zika  virus disease be prevented? There is no vaccine to prevent Zika. The best way to prevent the disease is to avoid infected mosquitoes and avoid exposure to body fluids that can spread the virus. Avoid any possible exposure to [place] by taking the following precautions. For women and their sex partners:  Avoid traveling to high-risk areas. The locations where [place] is being reported change often. To identify high-risk areas, check the CDC travel website: CreditChaos.com.ee  If you or your sex partner must travel to a high-risk area, talk with a health care provider before and after traveling.  Take all precautions to avoid mosquito bites if you live in, or travel to, any of the high-risk areas. Insect repellents are safe to use during pregnancy.  Ask your health care provider when it is safe to have sexual contact. For women:  If you are pregnant or trying to become pregnant, avoid sexual contact with persons who may have been exposed to [place] virus, persons who have possible symptoms of Zika, or persons whose history you are unsure about. If you choose to have sexual contact with someone who may have been exposed to [place] virus, use condoms correctly during the entire duration of sexual activity, every time. Do not share sexual devices, as you may be exposed to body fluids.  Ask your health care provider about when it is safe to attempt pregnancy after a possible exposure to [place] virus. What steps should I take to avoid mosquito bites? Take these steps to avoid mosquito bites when you  are in a high-risk area:  Wear loose clothing that covers your arms and legs.  Limit your outdoor activities.  Do not open windows unless they have window screens.  Sleep under mosquito nets.  Use insect repellent. The best insect repellents have:  DEET, picaridin, oil of lemon eucalyptus (OLE), or [id number] in them.  Higher amounts of an active ingredient in them.  Remember that insect repellents  are safe to use during pregnancy.  Do not use OLE on children who are younger than 28 years of age. Do not use insect repellent on babies who are younger than 54 months of age.  Cover your child's stroller with mosquito netting. Make sure the netting fits snugly and that any loose netting does not cover your child's mouth or nose. Do not use a blanket as a mosquito-protection cover.  Do not apply insect repellent underneath clothing.  If you are using sunscreen, apply the sunscreen before applying the insect repellent.  Treat clothing with permethrin. Do not apply permethrin directly to your skin. Follow label directions for safe use.  Get rid of standing water, where mosquitoes may reproduce. Standing water is often found in items such as buckets, bowls, animal food dishes, and flowerpots. When you return from traveling to any high-risk area, continue taking actions to protect yourself against mosquito bites for 3 weeks, even if you show no signs of illness. This will prevent spreading Zika virus to uninfected mosquitoes. What should I know about the sexual transmission of Zika? People can spread Zika to their sexual partners during vaginal, anal, or oral sex, or by sharing sexual devices. Many people with [place] do not develop symptoms, so a person could spread the disease without knowing that they are infected. The greatest risk is to women who are pregnant or who may become pregnant. Zika virus can live longer in semen than it can live in blood. Couples can prevent sexual transmission of the virus by:  Using condoms correctly during the entire duration of sexual activity, every time. This includes vaginal, anal, and oral sex.  Not sharing sexual devices. Sharing increases your risk of being exposed to body fluid from another person.  Avoiding all sexual activity until your health care provider says it is safe. Should I be tested for Zika virus? A sample of your blood can be tested for Zika  virus. A pregnant woman should be tested if she may have been exposed to the virus or if she has symptoms of Zika. She may also have additional tests done during her pregnancy, such ultrasound testing. Talk with your health care provider about which tests are recommended. This information is not intended to replace advice given to you by your health care provider. Make sure you discuss any questions you have with your health care provider. Document Released: 06/01/2015 Document Revised: 02/16/2016 Document Reviewed: 05/25/2015  2017 Elsevier Minor Illnesses and Medications in Pregnancy  Cold/Flu:  Sudafed for congestion- Robitussin (plain) for cough- Tylenol for discomfort.  Please follow the directions on the label.  Try not to take any more than needed.  OTC Saline nasal spray and air humidifier or cool-mist  Vaporizer to sooth nasal irritation and to loosen congestion.  It is also important to increase intake of non carbonated fluids, especially if you have a fever.  Constipation:  Colace-2 capsules at bedtime; Metamucil- follow directions on label; Senokot- 1 tablet at bedtime.  Any one of these medications can be used.  It is also very important to increase  fluids and fruits along with regular exercise.  If problem persists please call the office.  Diarrhea:  Kaopectate as directed on the label.  Eat a bland diet and increase fluids.  Avoid highly seasoned foods.  Headache:  Tylenol 1 or 2 tablets every 3-4 hours as needed  Indigestion:  Maalox, Mylanta, Tums or Rolaids- as directed on label.  Also try to eat small meals and avoid fatty, greasy or spicy foods.  Nausea with or without Vomiting:  Nausea in pregnancy is caused by increased levels of hormones in the body which influence the digestive system and cause irritation when stomach acids accumulate.  Symptoms usually subside after 1st trimester of pregnancy.  Try the following: 1. Keep saltines, graham crackers or dry toast by your bed to  eat upon awakening. 2. Don't let your stomach get empty.  Try to eat 5-6 small meals per day instead of 3 large ones. 3. Avoid greasy fatty or highly seasoned foods.  4. Take OTC Unisom 1 tablet at bed time along with OTC Vitamin B6 25-50 mg 3 times per day.    If nausea continues with vomiting and you are unable to keep down food and fluids you may need a prescription medication.  Please notify your provider.   Sore throat:  Chloraseptic spray, throat lozenges and or plain Tylenol.  Vaginal Yeast Infection:  OTC Monistat for 7 days as directed on label.  If symptoms do not resolve within a week notify provider.  If any of the above problems do not subside with recommended treatment please call the office for further assistance.   Do not take Aspirin, Advil, Motrin or Ibuprofen.  * * OTC= Over the counter Hyperemesis Gravidarum Hyperemesis gravidarum is a severe form of nausea and vomiting that happens during pregnancy. Hyperemesis is worse than morning sickness. It may cause you to have nausea or vomiting all day for many days. It may keep you from eating and drinking enough food and liquids. Hyperemesis usually occurs during the first half (the first 20 weeks) of pregnancy. It often goes away once a woman is in her second half of pregnancy. However, sometimes hyperemesis continues through an entire pregnancy. What are the causes? The cause of this condition is not known. It may be related to changes in chemicals (hormones) in the body during pregnancy, such as the high level of pregnancy hormone (human chorionic gonadotropin) or the increase in the female sex hormone (estrogen). What are the signs or symptoms? Symptoms of this condition include:  Severe nausea and vomiting.  Nausea that does not go away.  Vomiting that does not allow you to keep any food down.  Weight loss.  Body fluid loss (dehydration).  Having no desire to eat, or not liking food that you have previously  enjoyed. How is this diagnosed? This condition may be diagnosed based on:  A physical exam.  Your medical history.  Your symptoms.  Blood tests.  Urine tests. How is this treated? This condition may be managed with medicine. If medicines to do not help relieve nausea and vomiting, you may need to receive fluids through an IV tube at the hospital. Follow these instructions at home:  Take over-the-counter and prescription medicines only as told by your health care provider.  Avoid iron pills and multivitamins that contain iron for the first 3-4 months of pregnancy. If you take prescription iron pills, do not stop taking them unless your health care provider approves.  Take the following actions to help  prevent nausea and vomiting:  In the morning, before getting out of bed, try eating a couple of dry crackers or a piece of toast.  Avoid foods and smells that upset your stomach. Fatty and spicy foods may make nausea worse.  Eat 5-6 small meals a day.  Do not drink fluids while eating meals. Drink between meals.  Eat or suck on things that have ginger in them. Ginger can help relieve nausea.  Avoid food preparation. The smell of food can spoil your appetite or trigger nausea.  Follow instructions from your health care provider about eating or drinking restrictions.  For snacks, eat high-protein foods, such as cheese.  Keep all follow-up and pre-birth (prenatal) visits as told by your health care provider. This is important. Contact a health care provider if:  You have pain in your abdomen.  You have a severe headache.  You have vision problems.  You are losing weight. Get help right away if:  You cannot drink fluids without vomiting.  You vomit blood.  You have constant nausea and vomiting.  You are very weak.  You are very thirsty.  You feel dizzy.  You faint.  You have a fever or other symptoms that last for more than 2-3 days.  You have a fever and  your symptoms suddenly get worse. Summary  Hyperemesis gravidarum is a severe form of nausea and vomiting that happens during pregnancy.  Making some changes to your eating habits may help relieve nausea and vomiting.  This condition may be managed with medicine.  If medicines to do not help relieve nausea and vomiting, you may need to receive fluids through an IV tube at the hospital. This information is not intended to replace advice given to you by your health care provider. Make sure you discuss any questions you have with your health care provider. Document Released: 09/10/2005 Document Revised: 05/09/2016 Document Reviewed: 05/09/2016 Elsevier Interactive Patient Education  2017 [employer]. Commonly Asked Questions During Pregnancy  Cats: A parasite can be excreted in cat feces.  To avoid exposure you need to have another person empty the little box.  If you must empty the litter box you will need to wear gloves.  Wash your hands after handling your cat.  This parasite can also be found in raw or undercooked meat so this should also be avoided.  Colds, Sore Throats, Flu: Please check your medication sheet to see what you can take for symptoms.  If your symptoms are unrelieved by these medications please call the office.  Dental Work: Most any dental work [employer] recommends is permitted.  X-rays should only be taken during the first trimester if absolutely necessary.  Your abdomen should be shielded with a lead apron during all x-rays.  Please notify your provider prior to receiving any x-rays.  Novocaine is fine; gas is not recommended.  If your dentist requires a note from [place] prior to dental work please call the office and we will provide one for you.  Exercise: Exercise is an important part of staying healthy during your pregnancy.  You may continue most exercises you were accustomed to prior to pregnancy.  Later in your pregnancy you will most likely notice you have difficulty  with activities requiring balance like riding a bicycle.  It is important that you listen to your body and avoid activities that put you at a higher risk of falling.  Adequate rest and staying well hydrated are a must!  If you have questions  about the safety of specific activities ask your provider.    Exposure to Children with illness: Try to avoid obvious exposure; report any symptoms to [place] when noted,  If you have chicken pos, red measles or mumps, you should be immune to these diseases.   Please do not take any vaccines while pregnant unless you have checked with your OB provider.  Fetal Movement: After 28 weeks we recommend you do "kick counts" twice daily.  Lie or sit down in a calm quiet environment and count your baby movements "kicks".  You should feel your baby at least 10 times per hour.  If you have not felt 10 kicks within the first hour get up, walk around and have something sweet to eat or drink then repeat for an additional hour.  If count remains less than 10 per hour notify your provider.  Fumigating: Follow your pest control agent's advice as to how long to stay out of your home.  Ventilate the area well before re-entering.  Hemorrhoids:   Most over-the-counter preparations can be used during pregnancy.  Check your medication to see what is safe to use.  It is important to use a stool softener or fiber in your diet and to drink lots of liquids.  If hemorrhoids seem to be getting worse please call the office.   Hot Tubs:  Hot tubs Jacuzzis and saunas are not recommended while pregnant.  These increase your internal body temperature and should be avoided.  Intercourse:  Sexual intercourse is safe during pregnancy as long as you are comfortable, unless otherwise advised by your provider.  Spotting may occur after intercourse; report any bright red bleeding that is heavier than spotting.  Labor:  If you know that you are in labor, please go to the hospital.  If you are unsure, please  call the office and let us help you decide what to do.  Lifting, straining, etc:  If your job requires heavy lifting or straining please check with your provider for any limitations.  Generally, you should not lift items heavier than that you can lift simply with your hands and arms (no back muscles)  Painting:  Paint fumes do not harm your pregnancy, but may make you ill and should be avoided if possible.  Latex or water based paints have less odor than oils.  Use adequate ventilation while painting.  Permanents & Hair Color:  Chemicals in hair dyes are not recommended as they cause increase hair dryness which can increase hair loss during pregnancy.  " Highlighting" and permanents are allowed.  Dye may be absorbed differently and permanents may not hold as well during pregnancy.  Sunbathing:  Use a sunscreen, as skin burns easily during pregnancy.  Drink plenty of fluids; avoid over heating.  Tanning Beds:  Because their possible side effects are still unknown, tanning beds are not recommended.  Ultrasound Scans:  Routine ultrasounds are performed at approximately 20 weeks.  You will be able to see your baby's general anatomy an if you would like to know the gender this can usually be determined as well.  If it is questionable when you conceived you may also receive an ultrasound early in your pregnancy for dating purposes.  Otherwise ultrasound exams are not routinely performed unless there is a medical necessity.  Although you can request a scan we ask that you pay for it when conducted because insurance does not cover " patient request" scans.  Work: If your pregnancy proceeds without complications you  may work until your due date, unless your physician or employer advises otherwise.  Round Ligament Pain/Pelvic Discomfort:  Sharp, shooting pains not associated with bleeding are fairly common, usually occurring in the second trimester of pregnancy.  They tend to be worse when standing up or when  you remain standing for long periods of time.  These are the result of pressure of certain pelvic ligaments called "round ligaments".  Rest, Tylenol and heat seem to be the most effective relief.  As the womb and fetus grow, they rise out of the pelvis and the discomfort improves.  Please notify the office if your pain seems different than that described.  It may represent a more serious condition.

## 2016-08-10 ENCOUNTER — Ambulatory Visit (INDEPENDENT_AMBULATORY_CARE_PROVIDER_SITE_OTHER): Payer: Self-pay

## 2016-08-10 DIAGNOSIS — Z3491 Encounter for supervision of normal pregnancy, unspecified, first trimester: Secondary | ICD-10-CM

## 2016-08-10 LAB — CBC WITH DIFFERENTIAL/PLATELET
Basophils Absolute: 0 10*3/uL (ref 0.0–0.2)
Basos: 0 %
EOS (ABSOLUTE): 0.1 10*3/uL (ref 0.0–0.4)
Eos: 1 %
Hematocrit: 41.4 % (ref 34.0–46.6)
Hemoglobin: 14.2 g/dL (ref 11.1–15.9)
Immature Grans (Abs): 0 10*3/uL (ref 0.0–0.1)
Immature Granulocytes: 0 %
Lymphocytes Absolute: 1.9 10*3/uL (ref 0.7–3.1)
Lymphs: 16 %
MCH: 31.6 pg (ref 26.6–33.0)
MCHC: 34.3 g/dL (ref 31.5–35.7)
MCV: 92 fL (ref 79–97)
Monocytes Absolute: 0.7 10*3/uL (ref 0.1–0.9)
Monocytes: 6 %
Neutrophils Absolute: 9.2 10*3/uL — ABNORMAL HIGH (ref 1.4–7.0)
Neutrophils: 77 %
Platelets: 279 10*3/uL (ref 150–379)
RBC: 4.5 x10E6/uL (ref 3.77–5.28)
RDW: 12.5 % (ref 12.3–15.4)
WBC: 12 10*3/uL — ABNORMAL HIGH (ref 3.4–10.8)

## 2016-08-10 LAB — VARICELLA ZOSTER ANTIBODY, IGG: VARICELLA: 3375 {index} (ref >165)

## 2016-08-10 LAB — TOXOPLASMA ANTIBODIES- IGG AND  IGM
Toxoplasma Antibody- IgM: <3 AU/mL (ref 0.0–7.9)
Toxoplasma IgG Ratio: <3 IU/mL (ref 0.0–7.1)

## 2016-08-10 LAB — TSH: TSH: 1.84 u[IU]/mL (ref 0.450–4.500)

## 2016-08-10 LAB — HEMOGLOBIN A1C
Est. average glucose Bld gHb Est-mCnc: 114 mg/dL
Hgb A1c MFr Bld: 5.6 % (ref 4.8–5.6)

## 2016-08-10 LAB — HIV ANTIBODY (ROUTINE TESTING W REFLEX): HIV Screen 4th Generation wRfx: NONREACTIVE

## 2016-08-10 LAB — CULTURE, OB URINE

## 2016-08-10 LAB — RPR: RPR: NONREACTIVE

## 2016-08-10 LAB — RH TYPE: Rh Factor: POSITIVE

## 2016-08-10 LAB — HEPATITIS B SURFACE ANTIGEN: HEP B S AG: NEGATIVE

## 2016-08-10 LAB — URINE CULTURE, OB REFLEX

## 2016-08-10 LAB — ANTIBODY SCREEN: Antibody Screen: NEGATIVE

## 2016-08-10 LAB — RUBELLA SCREEN: Rubella Antibodies, IGG: 5.58 index (ref >0.99)

## 2016-08-10 LAB — ABO

## 2016-08-16 LAB — MICROSCOPIC EXAMINATION

## 2016-08-16 LAB — MONITOR DRUG PROFILE 14(MW)
AMPHETAMINE SCREEN URINE: NEGATIVE ng/mL
BARBITURATE SCREEN URINE: NEGATIVE ng/mL
BENZODIAZEPINE SCREEN, URINE: NEGATIVE ng/mL
Buprenorphine, Urine: NEGATIVE ng/mL
CANNABINOIDS UR QL SCN: NEGATIVE ng/mL
Cocaine (Metab) Scrn, Ur: NEGATIVE ng/mL
Creatinine(Crt), U: 377.8 mg/dL — ABNORMAL HIGH (ref 20.0–300.0)
Fentanyl, Urine: NEGATIVE pg/mL
Meperidine Screen, Urine: NEGATIVE ng/mL
Methadone Screen, Urine: NEGATIVE ng/mL
OXYCODONE+OXYMORPHONE UR QL SCN: NEGATIVE ng/mL
Opiate Scrn, Ur: NEGATIVE ng/mL
PH UR, DRUG SCRN: 6.2 (ref 4.5–8.9)
PROPOXYPHENE SCREEN URINE: NEGATIVE ng/mL
Phencyclidine Qn, Ur: NEGATIVE ng/mL
SPECIFIC GRAVITY: 1.016
Tramadol Screen, Urine: NEGATIVE ng/mL

## 2016-08-16 LAB — URINALYSIS, ROUTINE W REFLEX MICROSCOPIC
BILIRUBIN UA: NEGATIVE
GLUCOSE, UA: NEGATIVE
Leukocytes, UA: NEGATIVE
NITRITE UA: NEGATIVE
RBC UA: NEGATIVE
SPEC GRAV UA: 1.026 (ref 1.005–1.030)
UUROB: 0.2 mg/dL (ref 0.2–1.0)
pH, UA: 6.5 (ref 5.0–7.5)

## 2016-08-16 LAB — NICOTINE SCREEN, URINE: COTININE UR QL SCN: NEGATIVE ng/mL

## 2016-09-07 ENCOUNTER — Encounter: Payer: Self-pay | Admitting: Obstetrics and Gynecology

## 2016-09-07 ENCOUNTER — Telehealth: Payer: Self-pay | Admitting: Obstetrics and Gynecology

## 2016-09-07 DIAGNOSIS — O219 Vomiting of pregnancy, unspecified: Secondary | ICD-10-CM

## 2016-09-07 MED ORDER — PROMETHAZINE HCL 25 MG PO TABS: 25.0000 mg | ORAL_TABLET | Freq: Four times a day (QID) | ORAL | 1 refills | Status: DC | PRN

## 2016-09-07 NOTE — Telephone Encounter (Signed)
Pt called she actually sees dr Marcelline Mates next week but she is needing a refill on her phenergan.

## 2016-09-07 NOTE — Telephone Encounter (Signed)
RX sent in

## 2016-09-11 ENCOUNTER — Ambulatory Visit (INDEPENDENT_AMBULATORY_CARE_PROVIDER_SITE_OTHER): Payer: Medicaid Other | Admitting: Obstetrics and Gynecology

## 2016-09-11 ENCOUNTER — Encounter: Payer: Self-pay | Admitting: Obstetrics and Gynecology

## 2016-09-11 ENCOUNTER — Other Ambulatory Visit: Payer: Self-pay | Admitting: Obstetrics and Gynecology

## 2016-09-11 ENCOUNTER — Ambulatory Visit (INDEPENDENT_AMBULATORY_CARE_PROVIDER_SITE_OTHER): Payer: Medicaid Other

## 2016-09-11 VITALS — BP 124/70 | HR 93 | Wt 273.7 lb

## 2016-09-11 DIAGNOSIS — Z3481 Encounter for supervision of other normal pregnancy, first trimester: Secondary | ICD-10-CM

## 2016-09-11 DIAGNOSIS — O219 Vomiting of pregnancy, unspecified: Secondary | ICD-10-CM

## 2016-09-11 DIAGNOSIS — D259 Leiomyoma of uterus, unspecified: Secondary | ICD-10-CM | POA: Diagnosis not present

## 2016-09-11 DIAGNOSIS — Z3482 Encounter for supervision of other normal pregnancy, second trimester: Secondary | ICD-10-CM

## 2016-09-11 DIAGNOSIS — Z8679 Personal history of other diseases of the circulatory system: Secondary | ICD-10-CM

## 2016-09-11 DIAGNOSIS — Z369 Encounter for antenatal screening, unspecified: Secondary | ICD-10-CM

## 2016-09-11 DIAGNOSIS — J4540 Moderate persistent asthma, uncomplicated: Secondary | ICD-10-CM

## 2016-09-11 DIAGNOSIS — N926 Irregular menstruation, unspecified: Secondary | ICD-10-CM

## 2016-09-11 DIAGNOSIS — Z113 Encounter for screening for infections with a predominantly sexual mode of transmission: Secondary | ICD-10-CM

## 2016-09-11 LAB — POCT URINALYSIS DIPSTICK
BILIRUBIN UA: NEGATIVE
Blood, UA: NEGATIVE
GLUCOSE UA: NEGATIVE
Ketones, UA: NEGATIVE
LEUKOCYTES UA: NEGATIVE
NITRITE UA: NEGATIVE
Spec Grav, UA: >=1.03
Urobilinogen, UA: NEGATIVE
pH, UA: 6

## 2016-09-11 MED ORDER — ONDANSETRON HCL 4 MG PO TABS: 4.0000 mg | ORAL_TABLET | Freq: Three times a day (TID) | ORAL | 2 refills | Status: DC | PRN

## 2016-09-11 MED ORDER — FLUTICASONE-SALMETEROL 100-50 MCG/DOSE IN AEPB: 1.0000 | INHALATION_SPRAY | Freq: Two times a day (BID) | RESPIRATORY_TRACT | 6 refills | Status: DC

## 2016-09-11 MED ORDER — ONDANSETRON 4 MG PO TBDP: 4.0000 mg | ORAL_TABLET | Freq: Four times a day (QID) | ORAL | 2 refills | Status: DC | PRN

## 2016-09-11 NOTE — Patient Instructions (Addendum)
Second Trimester of Pregnancy The second trimester is from week 13 through week 28 (months 4 through 6). The second trimester is often a time when you feel your best. Your body has also adjusted to being pregnant, and you begin to feel better physically. Usually, morning sickness has lessened or quit completely, you may have more energy, and you may have an increase in appetite. The second trimester is also a time when the fetus is growing rapidly. At the end of the sixth month, the fetus is about 9 inches long and weighs about 1 pounds. You will likely begin to feel the baby move (quickening) between 18 and 20 weeks of the pregnancy. Body changes during your second trimester Your body continues to go through many changes during your second trimester. The changes vary from woman to woman.  Your weight will continue to increase. You will notice your lower abdomen bulging out.  You may begin to get stretch marks on your hips, abdomen, and breasts.  You may develop headaches that can be relieved by medicines. The medicines should be approved by your health care provider.  You may urinate more often because the fetus is pressing on your bladder.  You may develop or continue to have heartburn as a result of your pregnancy.  You may develop constipation because certain hormones are causing the muscles that push waste through your intestines to slow down.  You may develop hemorrhoids or swollen, bulging veins (varicose veins).  You may have back pain. This is caused by:  Weight gain.  Pregnancy hormones that are relaxing the joints in your pelvis.  A shift in weight and the muscles that support your balance.  Your breasts will continue to grow and they will continue to become tender.  Your gums may bleed and may be sensitive to brushing and flossing.  Dark spots or blotches (chloasma, mask of pregnancy) may develop on your face. This will likely fade after the baby is born.  A dark line  from your belly button to the pubic area (linea nigra) may appear. This will likely fade after the baby is born.  You may have changes in your hair. These can include thickening of your hair, rapid growth, and changes in texture. Some women also have hair loss during or after pregnancy, or hair that feels dry or thin. Your hair will most likely return to normal after your baby is born. What to expect at prenatal visits During a routine prenatal visit:  You will be weighed to make sure you and the fetus are growing normally.  Your blood pressure will be taken.  Your abdomen will be measured to track your baby's growth.  The fetal heartbeat will be listened to.  Any test results from the previous visit will be discussed. Your health care provider may ask you:  How you are feeling.  If you are feeling the baby move.  If you have had any abnormal symptoms, such as leaking fluid, bleeding, severe headaches, or abdominal cramping.  If you are using any tobacco products, including cigarettes, chewing tobacco, and electronic cigarettes.  If you have any questions. Other tests that may be performed during your second trimester include:  Blood tests that check for:  Low iron levels (anemia).  Gestational diabetes (between 24 and 28 weeks).  Rh antibodies. This is to check for a protein on red blood cells (Rh factor).  Urine tests to check for infections, diabetes, or protein in the urine.  An ultrasound to  confirm the proper growth and development of the baby.  An amniocentesis to check for possible genetic problems.  Fetal screens for spina bifida and Down syndrome.  HIV (human immunodeficiency virus) testing. Routine prenatal testing includes screening for HIV, unless you choose not to have this test. Follow these instructions at home: Eating and drinking  Continue to eat regular, healthy meals.  Avoid raw meat, uncooked cheese, cat litter boxes, and soil used by cats. These  carry germs that can cause birth defects in the baby.  Take your prenatal vitamins.  Take 1500-2000 mg of calcium daily starting at the 20th week of pregnancy until you deliver your baby.  If you develop constipation:  Take over-the-counter or prescription medicines.  Drink enough fluid to keep your urine clear or pale yellow.  Eat foods that are high in fiber, such as fresh fruits and vegetables, whole grains, and beans.  Limit foods that are high in fat and processed sugars, such as fried and sweet foods. Activity  Exercise only as directed by your health care provider. Experiencing uterine cramps is a good sign to stop exercising.  Avoid heavy lifting, wear low heel shoes, and practice good posture.  Wear your seat belt at all times when driving.  Rest with your legs elevated if you have leg cramps or low back pain.  Wear a good support bra for breast tenderness.  Do not use hot tubs, steam rooms, or saunas. Lifestyle  Avoid all smoking, herbs, alcohol, and unprescribed drugs. These chemicals affect the formation and growth of the baby.  Do not use any products that contain nicotine or tobacco, such as cigarettes and e-cigarettes. If you need help quitting, ask your health care provider.  A sexual relationship may be continued unless your health care provider directs you otherwise. General instructions  Follow your health care provider's instructions regarding medicine use. There are medicines that are either safe or unsafe to take during pregnancy.  Take warm sitz baths to soothe any pain or discomfort caused by hemorrhoids. Use hemorrhoid cream if your health care provider approves.  If you develop varicose veins, wear support hose. Elevate your feet for 15 minutes, 3-4 times a day. Limit salt in your diet.  Visit your dentist if you have not gone yet during your pregnancy. Use a soft toothbrush to brush your teeth and be gentle when you floss.  Keep all follow-up  prenatal visits as told by your health care provider. This is important. Contact a health care provider if:  You have dizziness.  You have mild pelvic cramps, pelvic pressure, or nagging pain in the abdominal area.  You have persistent nausea, vomiting, or diarrhea.  You have a bad smelling vaginal discharge.  You have pain with urination. Get help right away if:  You have a fever.  You are leaking fluid from your vagina.  You have spotting or bleeding from your vagina.  You have severe abdominal cramping or pain.  You have rapid weight gain or weight loss.  You have shortness of breath with chest pain.  You notice sudden or extreme swelling of your face, hands, ankles, feet, or legs.  You have not felt your baby move in over an hour.  You have severe headaches that do not go away with medicine.  You have vision changes. Summary  The second trimester is from week 13 through week 28 (months 4 through 6). It is also a time when the fetus is growing rapidly.  Your body goes  through many changes during pregnancy. The changes vary from woman to woman.  Avoid all smoking, herbs, alcohol, and unprescribed drugs. These chemicals affect the formation and growth your baby.  Do not use any tobacco products, such as cigarettes, chewing tobacco, and e-cigarettes. If you need help quitting, ask your health care provider.  Contact your health care provider if you have any questions. Keep all prenatal visits as told by your health care provider. This is important. This information is not intended to replace advice given to you by your health care provider. Make sure you discuss any questions you have with your health care provider. Document Released: 09/04/2001 Document Revised: 02/16/2016 Document Reviewed: 11/11/2012 Elsevier Interactive Patient Education  2017 [employer].  How a Baby Grows During Pregnancy Introduction Pregnancy begins when a female's sperm enters a female's egg  (fertilization). This happens in one of the tubes (fallopian tubes) that connect the ovaries to the womb (uterus). The fertilized egg is called an embryo until it reaches 10 weeks. From 10 weeks until birth, it is called a fetus. The fertilized egg moves down the fallopian tube to the uterus. Then it implants into the lining of the uterus and begins to grow. The developing fetus receives oxygen and nutrients through the pregnant woman's bloodstream and the tissues that grow (placenta) to support the fetus. The placenta is the life support system for the fetus. It provides nutrition and removes waste. Learning as much as you can about your pregnancy and how your baby is developing can help you enjoy the experience. It can also make you aware of when there might be a problem and when to ask questions. How long does a typical pregnancy last? A pregnancy usually lasts 280 days, or about 40 weeks. Pregnancy is divided into three trimesters:  First trimester: 0-13 weeks.  Second trimester: 14-27 weeks.  Third trimester: 28-40 weeks. The day when your baby is considered ready to be born (full term) is your estimated date of delivery. How does my baby develop month by month? First month  The fertilized egg attaches to the inside of the uterus.  Some cells will form the placenta. Others will form the fetus.  The arms, legs, brain, spinal cord, lungs, and heart begin to develop.  At the end of the first month, the heart begins to beat. Second month  The bones, inner ear, eyelids, hands, and feet form.  The genitals develop.  By the end of 8 weeks, all major organs are developing. Third month  All of the internal organs are forming.  Teeth develop below the gums.  Bones and muscles begin to grow. The spine can flex.  The skin is transparent.  Fingernails and toenails begin to form.  Arms and legs continue to grow longer, and hands and feet develop.  The fetus is about 3 in (7.6 cm)  long. Fourth month  The placenta is completely formed.  The external sex organs, neck, outer ear, eyebrows, eyelids, and fingernails are formed.  The fetus can hear, swallow, and move its arms and legs.  The kidneys begin to produce urine.  The skin is covered with a white waxy coating (vernix) and very fine hair (lanugo). Fifth month  The fetus moves around more and can be felt for the first time (quickening).  The fetus starts to sleep and wake up and may begin to suck its finger.  The nails grow to the end of the fingers.  The organ in the digestive system that  makes bile (gallbladder) functions and helps to digest the nutrients.  If your baby is a girl, eggs are present in her ovaries. If your baby is a boy, testicles start to move down into his scrotum. Sixth month  The lungs are formed, but the fetus is not yet able to breathe.  The eyes open. The brain continues to develop.  Your baby has fingerprints and toe prints. Your baby's hair grows thicker.  At the end of the second trimester, the fetus is about 9 in (22.9 cm) long. Seventh month  The fetus kicks and stretches.  The eyes are developed enough to sense changes in light.  The hands can make a grasping motion.  The fetus responds to sound. Eighth month  All organs and body systems are fully developed and functioning.  Bones harden and taste buds develop. The fetus may hiccup.  Certain areas of the brain are still developing. The skull remains soft. Ninth month  The fetus gains about  lb (0.23 kg) each week.  The lungs are fully developed.  Patterns of sleep develop.  The fetus's head typically moves into a head-down position (vertex) in the uterus to prepare for birth. If the buttocks move into a vertex position instead, the baby is breech.  The fetus weighs 6-9 lbs (2.72-4.08 kg) and is 19-20 in (48.26-50.8 cm) long. What can I do to have a healthy pregnancy and help my baby develop?  Eating  and Drinking  Eat a healthy diet.  Talk with your health care provider to make sure that you are getting the nutrients that you and your baby need.  Visit www.BuildDNA.es to learn about creating a healthy diet.  Gain a healthy amount of weight during pregnancy as advised by your health care provider. This is usually 25-35 pounds. You may need to:  Gain more if you were underweight before getting pregnant or if you are pregnant with more than one baby.  Gain less if you were overweight or obese when you got pregnant. Medicines and Vitamins  Take prenatal vitamins as directed by your health care provider. These include vitamins such as folic acid, iron, calcium, and vitamin D. They are important for healthy development.  Take medicines only as directed by your health care provider. Read labels and ask a pharmacist or your health care provider whether over-the-counter medicines, supplements, and prescription drugs are safe to take during pregnancy. Activities  Be physically active as advised by your health care provider. Ask your health care provider to recommend activities that are safe for you to do, such as walking or swimming.  Do not participate in strenuous or extreme sports.  Lifestyle  Do not drink alcohol.  Do not use any tobacco products, including cigarettes, chewing tobacco, or electronic cigarettes. If you need help quitting, ask your health care provider.  Do not use illegal drugs. Safety  Avoid exposure to mercury, lead, or other heavy metals. Ask your health care provider about common sources of these heavy metals.  Avoid listeria infection during pregnancy. Follow these precautions:  Do not eat soft cheeses or deli meats.  Do not eat hot dogs unless they have been warmed up to the point of steaming, such as in the microwave oven.  Do not drink unpasteurized milk.  Avoid toxoplasmosis infection during pregnancy. Follow these precautions:  Do not change  your cat's litter box, if you have a cat. Ask someone else to do this for you.  Wear gardening gloves while working in the  yard. [employer]  Keep all follow-up visits as directed by your health care provider. This is important. This includes prenatal care and screening tests.  Manage any chronic health conditions. Work closely with your health care provider to keep conditions, such as diabetes, under control. How do I know if my baby is developing well? At each prenatal visit, your health care provider will do several different tests to check on your health and keep track of your baby's development. These include:  Fundal height.  Your health care provider will measure your growing belly from top to bottom using a tape measure.  Your health care provider will also feel your belly to determine your baby's position.  Heartbeat.  An ultrasound in the first trimester can confirm pregnancy and show a heartbeat, depending on how far along you are.  Your health care provider will check your baby's heart rate at every prenatal visit.  As you get closer to your delivery date, you may have regular fetal heart rate monitoring to make sure that your baby is not in distress.  Second trimester ultrasound.  This ultrasound checks your baby's development. It also indicates your baby's gender. What should I do if I have concerns about my baby's development? Always talk with your health care provider about any concerns that you may have. This information is not intended to replace advice given to you by your health care provider. Make sure you discuss any questions you have with your health care provider. Document Released: 02/27/2008 Document Revised: 02/16/2016 Document Reviewed: 02/17/2014  2017 Elsevier

## 2016-09-11 NOTE — Progress Notes (Signed)
OBSTETRIC INITIAL PRENATAL VISIT  Subjective:    Jane Jackson is being seen today for her first obstetrical visit.  This is a planned pregnancy. She is 30 y.o. G2P0010 female at [redacted]w[redacted]d gestation, Estimated Date of Delivery: 03/20/17 with Patient's last menstrual period was 06/13/2016 (exact date consistent with 8 week sono). Her obstetrical history is significant for obesity and asthma. Relationship with FOB: spouse, living together. Patient does intend to breast feed. Pregnancy history fully reviewed.   Obstetric History   G2   P0   T0   P0   A1   L0    SAB1   TAB0   Ectopic0   Multiple0   Live Births0     # Outcome Date GA Lbr Len/2nd Weight Sex Delivery Anes PTL Lv  2 Current           1 SAB               Gynecologic History:  Last pap smear was 2017 (performed at [facility]).  Results were normal.  Denies h/o abnormal pap smears in the past.  Denies history of STIs.    Past Medical History:  Diagnosis Date  . Anxiety   . Asthma   . Chicken pox   . Complication of anesthesia    issue with asthma and intubation  . Cystic dysplasia of one kidney    about 4 months ago-no f/u- largest 37mm?  . Diverticulitis   . Fibroid, uterine    dx a few months ago  . GERD (gastroesophageal reflux disease)   . H/O mitral valve prolapse    as a child  . Impaction, bowel ([place])   . Kidney stones      Family History  Problem Relation Age of Onset  . Arthritis Mother   . Hypertension Mother   . Diabetes Mother   . Cervical cancer Mother   . Alcohol abuse Father   . Arthritis Father   . Hypertension Father   . Arthritis Maternal Grandmother   . Heart disease Maternal Grandmother   . Stroke Maternal Grandmother   . Hypertension Maternal Grandmother   . Diabetes Maternal Grandmother   . Arthritis Maternal Grandfather   . Colon cancer Maternal Grandfather   . Prostate cancer Maternal Grandfather   . Heart disease Maternal Grandfather   . Stroke Maternal Grandfather   . Hypertension  Maternal Grandfather   . Arthritis Paternal Grandmother   . Heart disease Paternal Grandmother   . Hypertension Paternal Grandmother   . Diabetes Paternal Grandmother   . Arthritis Paternal Grandfather   . Heart disease Paternal Grandfather   . Hypertension Paternal Grandfather   . Thyroid disease Maternal Aunt     thyroid cancer     Past Surgical History:  Procedure Laterality Date  . APPENDECTOMY    . CHOLECYSTECTOMY N/A 01/18/2016   Procedure: LAPAROSCOPIC CHOLECYSTECTOMY WITH INTRAOPERATIVE CHOLANGIOGRAM;  Surgeon: Excell Seltzer, MD;  Location: [facility];  Service: General;  Laterality: N/A;  . ruptured cyst    . TONSILLECTOMY AND ADENOIDECTOMY     age 65  . TYMPANOSTOMY TUBE PLACEMENT       Social History   Social History  . Marital status: Married    Spouse name: N/A  . Number of children: N/A  . Years of education: N/A   Occupational History  . Not on file.   Social History Main Topics  . Smoking status: Former Research scientist (life sciences)  . Smokeless tobacco: Never Used     Comment:  Smokes socially.   . Alcohol use 0.0 oz/week     Comment: 2-3 glasses of wine; ~ 2x/month-not since pregnancy  . Drug use: No  . Sexual activity: Yes    Partners: Male   Other Topics Concern  . Not on file   Social History Narrative  . No narrative on file     Current Outpatient Prescriptions on File Prior to Visit  Medication Sig Dispense Refill  . acetaminophen (TYLENOL) 325 MG tablet Take 2 tablets (650 mg total) by mouth every 6 (six) hours as needed for mild pain (or Fever >/= 101).    Marland Kitchen albuterol (ACCUNEB) 1.25 MG/3ML nebulizer solution Take 3 mLs by nebulization 2 (two) times daily as needed. Asthma.    . Prenatal Vit-Fe Fumarate-FA (MULTIVITAMIN-PRENATAL) 27-0.8 MG TABS tablet Take 1 tablet by mouth daily at 12 noon.    Marland Kitchen PROAIR HFA 108 (90 Base) MCG/ACT inhaler INHALE 2 PUFFS INTO THE LUNGS EVERY 6 (SIX) HOURS AS NEEDED FOR WHEEZING OR SHORTNESS OF BREATH. 8.5 Inhaler 0  .  promethazine (PHENERGAN) 25 MG tablet Take 1 tablet (25 mg total) by mouth every 6 (six) hours as needed for nausea or vomiting. 30 tablet 1   No current facility-administered medications on file prior to visit.     Allergies  Allergen Reactions  . Latex Itching and Rash    Review of Systems General:Not Present- Fever, Weight Loss and Weight Gain. Skin:Not Present- Rash. HEENT:Not Present- Blurred Vision, Headache and Bleeding Gums. Respiratory: Present- Difficulty Breathing (is using Asthma inhaler daily. Breast:Not Present- Breast Mass. Cardiovascular:Not Present- Chest Pain, Elevated Blood Pressure, Fainting / Blacking Out and Shortness of Breath. Gastrointestinal:Present - Nausea and Vomiting (not relieved by Vitamin B6 and Diclegis and does not like side effects from Phenergan).  Not Present- Abdominal Pain, Constipation. Female Genitourinary:Not Present- Frequency, Painful Urination, Pelvic Pain, Vaginal Bleeding, Vaginal Discharge, Contractions, regular, Fetal Movements Decreased, Urinary Complaints and Vaginal Fluid. Musculoskeletal:Not Present- Back Pain and Leg Cramps. Neurological:Not Present- Dizziness. Psychiatric:Not Present- Depression.     Objective:   Blood pressure 124/70, pulse 93, weight 273 lb 11.2 oz (124.1 kg), last menstrual period 06/13/2016.  Body mass index is 46.98 kg/m.  General Appearance:    Alert, cooperative, no distress, appears stated age  Head:    Normocephalic, without obvious abnormality, atraumatic  Eyes:    PERRL, conjunctiva/corneas clear, EOM's intact, both eyes  Ears:    Normal external ear canals, both ears  Nose:   Nares normal, septum midline, mucosa normal, no drainage or sinus tenderness  Throat:   Lips, mucosa, and tongue normal; teeth and gums normal  Neck:   Supple, symmetrical, trachea midline, no adenopathy; thyroid: no enlargement/tenderness/nodules; no carotid bruit or JVD  Back:     Symmetric, no curvature, ROM  normal, no CVA tenderness  Lungs:     Clear to auscultation bilaterally, respirations unlabored  Chest Wall:    No tenderness or deformity   Heart:    Regular rate and rhythm, S1 and S2 normal, no murmur, rub or gallop  Breast Exam:    No tenderness, masses, or nipple abnormality  Abdomen:     Soft, non-tender, bowel sounds active all four quadrants, no masses, no organomegaly.  FH undetermined due to body habitus.  FHT 156 bpm.  Genitalia:    Pelvic:external genitalia normal, vagina without lesions, discharge, or tenderness, rectovaginal septum  normal. Cervix normal in appearance, no cervical motion tenderness, no adnexal masses or tenderness.  Pregnancy positive findings:  uterine enlargement: 13-14 wk size, nontender.   Rectal:    Normal external sphincter.  No hemorrhoids appreciated. Internal exam not done.   Extremities:   Extremities normal, atraumatic, no cyanosis or edema  Pulses:   2+ and symmetric all extremities  Skin:   Skin color, texture, turgor normal, no rashes or lesions  Lymph nodes:   Cervical, supraclavicular, and axillary nodes normal  Neurologic:   CNII-XII intact, normal strength, sensation and reflexes throughout    Assessment:   Pregnancy at 12 and 6/7 weeks   Morbid obesity  Asthma (poorly controlled) H/o MVP (childhood) Nausea/vomiting of pregnancy    Plan:   Initial labs reviewed. Prenatal vitamins encouraged. Problem list reviewed and updated. New OB counseling:  The patient has been given an overview regarding routine prenatal care.  Recommendations regarding diet, weight gain, and exercise in pregnancy were given.  Patient should gain no more than 15 lbs this pregnancy. Discussed that she may even lose weight during the pregnancy.  Prenatal testing, optional genetic testing, and ultrasound use in pregnancy were reviewed.  AFP3 discussed: results reviewed.  Negative screen.  Benefits of Breast Feeding were discussed. The patient is encouraged to consider  nursing her baby post partum. Asthma poorly controlled, will begin on Advair daily.  Can use inhaler and nebulizers prn. If still uncontrolled, would recommend referral to Pulmonology.  Nausea/vomiting of pregnancy, not controlled with OTC meds, did not like effects of Diclegis (hypersomnolence).  Was afraid of taking Zofran due to hearing of possible effects to fetus.  Advised patient that Zofran was ok to take as needed during early portion of pregnancy if benefits outweighed risks. Patient more willing to try Zofran.  H/o MVP in childhood, no murmur present today.  May reappear later in pregnancy due to vascular changes in pregnancy.  Will continue to monitor.  Patient will need to perform early glucola due to obesity.  Follow up in 4 weeks.  70% of 30 min visit spent on counseling and coordination of care.

## 2016-09-14 LAB — GC/CHLAMYDIA PROBE AMP
Chlamydia trachomatis, NAA: NEGATIVE
Neisseria gonorrhoeae by PCR: NEGATIVE

## 2016-09-16 ENCOUNTER — Encounter: Payer: Self-pay | Admitting: Obstetrics and Gynecology

## 2016-09-16 DIAGNOSIS — O219 Vomiting of pregnancy, unspecified: Secondary | ICD-10-CM | POA: Insufficient documentation

## 2016-09-16 DIAGNOSIS — Z8679 Personal history of other diseases of the circulatory system: Secondary | ICD-10-CM | POA: Insufficient documentation

## 2016-09-16 DIAGNOSIS — J45909 Unspecified asthma, uncomplicated: Secondary | ICD-10-CM | POA: Insufficient documentation

## 2016-09-16 DIAGNOSIS — J4540 Moderate persistent asthma, uncomplicated: Secondary | ICD-10-CM | POA: Insufficient documentation

## 2016-09-16 HISTORY — DX: Personal history of other diseases of the circulatory system: Z86.79

## 2016-09-24 NOTE — L&D Delivery Note (Signed)
        Delivery Note   Kymberlee Viger is a 31 y.o. G2P0010 at [redacted]w[redacted]d Estimated Date of Delivery: 03/22/17  PRE-OPERATIVE DIAGNOSIS:  1) [redacted]w[redacted]d pregnancy.   POST-OPERATIVE DIAGNOSIS:  1) [redacted]w[redacted]d pregnancy s/p Vaginal, Spontaneous Delivery   Delivery Type: Vaginal, Spontaneous Delivery    Delivery Clinician: Harlin Heys   Delivery Anesthesia: Epidural   Labor Complications:     Additional complications: Maternal fever  ESTIMATED BLOOD LOSS: 75  ml    FINDINGS:   1) female infant  2) Nuchal cord: No  SPECIMENS:   PLACENTA:   Appearance: Intact    Removal: Spontaneous      Disposition:     DISPOSITION:  Infant to left in stable condition in the delivery room, with L&D personnel and mother,  NARRATIVE SUMMARY: Labor course:  Ms. Kauri Garson is a G2P0010 at [redacted]w[redacted]d who presented for PROM.  She progressed well in labor with pitocin.  She received the appropriate anesthesia and proceeded to complete dilation. She evidenced good maternal expulsive effort during the second stage. She went on to deliver a viable infant. The placenta delivered without problems and was noted to be complete. A perineal and vaginal examination was performed. Episiotomy/Lacerations: Labial;Perineal;1st degree  Lacerations were repaired with Vicryl suture for approximation purposes The patient tolerated this well.  Finis Bud, M.D. 03/13/2017 9:10 AM

## 2016-09-26 LAB — FIRST TRIMESTER SCREEN W/NT
CRL: 64.9 mm
DIA MOM: 1.02
DIA VALUE: 171.8 pg/mL
GEST AGE-COLLECT: 12.7 wk
HCG MOM: 2
HCG VALUE: 121.1 [IU]/mL
Maternal Age At EDD: 33.4 years
Nuchal Translucency MoM: 2.05
Nuchal Translucency: 2.5 mm
Number of Fetuses: 1
PAPP-A MoM: 1.08
PAPP-A VALUE: 509.4 ng/mL
Test Results:: POSITIVE — AB
Weight: 278 [lb_av]

## 2016-09-26 LAB — SPECIMEN STATUS REPORT

## 2016-09-28 ENCOUNTER — Telehealth: Payer: Self-pay | Admitting: Obstetrics and Gynecology

## 2016-09-28 NOTE — Telephone Encounter (Signed)
Contacted patient regarding abnormal genetic first trimester screening.  Left message for patient to return phone call.

## 2016-10-01 ENCOUNTER — Encounter: Payer: Self-pay | Admitting: Obstetrics and Gynecology

## 2016-10-02 ENCOUNTER — Encounter: Payer: Self-pay | Admitting: Obstetrics and Gynecology

## 2016-10-02 DIAGNOSIS — O285 Abnormal chromosomal and genetic finding on antenatal screening of mother: Secondary | ICD-10-CM

## 2016-10-02 NOTE — Telephone Encounter (Signed)
Jane Jackson called for results she said she was told to call

## 2016-10-03 NOTE — Telephone Encounter (Signed)
Phone call returned in response to patient's mychart messages. Patient was contacted last Friday regarding test results (but no answer received, left voicemail for patient to return call) .  Her test notes 1st trimester screen with increased risk for DS. Answered all questions.  Patient has a referral placed with [facility] regarding abnormal 1st trimester screen.  Also offered cell-free DNA testing as an option as it is a more sensitive test.  Patient notes that she has an appointment tomorrow and would like to have the testing performed.     Patient reports that she is stressed out because of the testing and would like to know as soon as possible, because if she did have a child that was affected, she would be more prone to termination, and does not want to get much further in the pregnancy without knowing something.  Reassured patient that we would try to answer as much as possible in a timely fashion.    Rubie Maid, MD [facility] 10/03/2016  2:08 P.M.

## 2016-10-04 ENCOUNTER — Encounter: Payer: Self-pay | Admitting: Obstetrics and Gynecology

## 2016-10-04 ENCOUNTER — Other Ambulatory Visit: Payer: Medicaid Other

## 2016-10-04 ENCOUNTER — Ambulatory Visit (INDEPENDENT_AMBULATORY_CARE_PROVIDER_SITE_OTHER): Payer: Medicaid Other | Admitting: Obstetrics and Gynecology

## 2016-10-04 VITALS — BP 113/80 | HR 107 | Wt 271.2 lb

## 2016-10-04 DIAGNOSIS — O2611 Low weight gain in pregnancy, first trimester: Secondary | ICD-10-CM

## 2016-10-04 DIAGNOSIS — Z3402 Encounter for supervision of normal first pregnancy, second trimester: Secondary | ICD-10-CM | POA: Diagnosis not present

## 2016-10-04 DIAGNOSIS — Z131 Encounter for screening for diabetes mellitus: Secondary | ICD-10-CM | POA: Diagnosis not present

## 2016-10-04 DIAGNOSIS — O219 Vomiting of pregnancy, unspecified: Secondary | ICD-10-CM | POA: Diagnosis not present

## 2016-10-04 DIAGNOSIS — Z1379 Encounter for other screening for genetic and chromosomal anomalies: Secondary | ICD-10-CM

## 2016-10-04 DIAGNOSIS — Z3482 Encounter for supervision of other normal pregnancy, second trimester: Secondary | ICD-10-CM

## 2016-10-04 DIAGNOSIS — O99212 Obesity complicating pregnancy, second trimester: Secondary | ICD-10-CM | POA: Diagnosis not present

## 2016-10-04 LAB — POCT URINALYSIS DIPSTICK
BILIRUBIN UA: NEGATIVE
Blood, UA: NEGATIVE
Glucose, UA: NEGATIVE
KETONES UA: NEGATIVE
LEUKOCYTES UA: NEGATIVE
Nitrite, UA: NEGATIVE
SPEC GRAV UA: 1.02
Urobilinogen, UA: NEGATIVE
pH, UA: 6

## 2016-10-04 MED ORDER — METOCLOPRAMIDE HCL 10 MG PO TABS: 10.0000 mg | ORAL_TABLET | Freq: Four times a day (QID) | ORAL | 2 refills | Status: DC | PRN

## 2016-10-04 NOTE — Progress Notes (Signed)
ROB and early GTT today. Declines flu vaccine today. Pos Downs on NT. Per ac needs panorama today. [facility] appt scheduled for 10/11/2016.

## 2016-10-04 NOTE — Progress Notes (Signed)
ROB: Patient c/o of persistent nausea/vomiting, notes no improvement with Zofran, modest improvement with Phenergan (but can only take at night due to drowsiness).  Is worried as she also has no appetite. Discussed trial of Reglan.  Also can prescribe Boost/Ensure for nutritional supplementation until better able to tolerate full meals. Patient also presents for further discussion of abnormal 1st trimester screen results. Has increased risk of Down Syndrome (1:56, normal cut-off for age is 1:250).  Discussion had that the 1st trimester screen is a screening test, and does not confirm diagnosis of Down Syndrome.  Recommend referral to [facility] for further genetic counseling. Briefly discussed additional options with patient, including testing with (circulating cell-free DNA testing) which has a higher sensitivity, and/or further discussion and testing with MFM (including ultrasounds, amniocentesis). Patient desires to proceed with cell-free DNA testing, Panorama  ordered.  Will also get AFP spina bifida screen. Referral to MFM made for further genetic counseling and discussion of results.  Patient now also notes mental retardation on FOB side (uncle), CF on mom's side.  RTC in 4 weeks, for anatomy scan at that time unless other further more detailed imaging recommended by [facility] in which we will defer to them.  All questions answered. For early gluocla today for h/o obesity in pregnancy.    A total of 30 minutes were spent face-to-face with the patient during this encounter and over half of that time involved counseling and coordination of care.   Rubie Maid, MD [facility]

## 2016-10-06 LAB — GLUCOSE, 1 HOUR GESTATIONAL: GESTATIONAL DIABETES SCREEN: 95 mg/dL (ref 65–139)

## 2016-10-11 ENCOUNTER — Ambulatory Visit: Admission: RE | Admit: 2016-10-11 | Payer: BLUE CROSS/BLUE SHIELD | Source: Ambulatory Visit

## 2016-10-15 ENCOUNTER — Other Ambulatory Visit: Payer: Self-pay | Admitting: Obstetrics and Gynecology

## 2016-10-15 ENCOUNTER — Encounter: Payer: Self-pay | Admitting: Obstetrics and Gynecology

## 2016-10-15 ENCOUNTER — Ambulatory Visit
Admission: RE | Admit: 2016-10-15 | Discharge: 2016-10-15 | Disposition: A | Discharge status: Home / Self Care | Payer: Medicaid Other | Source: Ambulatory Visit | Attending: Obstetrics and Gynecology | Admitting: Obstetrics and Gynecology

## 2016-10-15 ENCOUNTER — Other Ambulatory Visit: Payer: Self-pay | Admitting: *Deleted

## 2016-10-15 DIAGNOSIS — O289 Unspecified abnormal findings on antenatal screening of mother: Secondary | ICD-10-CM

## 2016-10-15 DIAGNOSIS — Z810 Family history of intellectual disabilities: Secondary | ICD-10-CM | POA: Insufficient documentation

## 2016-10-15 DIAGNOSIS — Z3A17 17 weeks gestation of pregnancy: Secondary | ICD-10-CM | POA: Diagnosis not present

## 2016-10-15 DIAGNOSIS — O99212 Obesity complicating pregnancy, second trimester: Secondary | ICD-10-CM | POA: Insufficient documentation

## 2016-10-15 DIAGNOSIS — Z6841 Body Mass Index (BMI) 40.0 and over, adult: Secondary | ICD-10-CM | POA: Insufficient documentation

## 2016-10-15 DIAGNOSIS — Z8349 Family history of other endocrine, nutritional and metabolic diseases: Secondary | ICD-10-CM | POA: Diagnosis not present

## 2016-10-15 DIAGNOSIS — E669 Obesity, unspecified: Secondary | ICD-10-CM | POA: Insufficient documentation

## 2016-10-15 NOTE — Progress Notes (Addendum)
Referring Physician:  [facility] Time of Consultation: 30 minutes   [name] was referred to the [facility] for genetic counseling because of an increased risk for Down syndrome by the first trimester prenatal screen.  Results from Panorama testing are pending.  This note summarizes the information we discussed.   We provided background information on the first trimester prenatal screen.  Screening was performed at [facility] (sent to [place]) which included nuchal translucency ultrasound and first trimester maternal serum marker screening (PAPP-A, Free beta hCG and DIA).  The nuchal translucency has approximately an 80% detection rate for Down syndrome and can be positive for other chromosome abnormalities as well as heart defects.  When combined with the maternal serum marker screening, the detection rate is up to 86% for Down syndrome and up to 75% for trisomy 18.  This screening provides an individualized risk assessment for Down syndrome and trisomy 18.  Because it is not a diagnostic test, it cannot confirm if a baby has a chromosome difference, it simply provides a risk assessment to allow for the option of additional diagnostic testing if desired.  The first trimester screen results for [name] revealed protein levels and a nuchal translucency measurement that increased the chance of Down syndrome in the pregnancy.  Before screening, the age-related chance of Down syndrome in the pregnancy was 1 in 410.  Given the first trimester screen results, the chance was estimated to be 1 in 32.  This means that the chance the baby does not have Down syndrome is greater than 98 percent.  The chance for trisomy 18 was 1 in 2,600, which is not increased.    [facility] then ordered cell free fetal DNA testing (Panorama testing) to provide additional risk assessment for Down syndrome, trisomy 75, trisomy 11 or sex chromosome conditions. This test utilizes a maternal  blood sample and DNA sequencing technology to isolate circulating cell free fetal DNA from maternal plasma. The fetal DNA can then be analyzed for DNA sequences that are derived from the three most common chromosomes involved in aneuploidy, chromosomes 13, 18, and 21. If the overall amount of DNA is greater than the expected level for any of these chromosomes, aneuploidy is suspected. This testing is able to provide another means of determining the chance for one of these common chromosome conditions, without requiring an invasive procedure and traditional karyotype analysis. We explained that while we do not consider it a replacement for invasive testing and karyotype analysis, a negative result from this testing would be reassuring, though not a guarantee of a normal chromosome complement for the baby. An abnormal result is certainly suggestive of an abnormal chromosome complement, though we would still recommend amniocentesis to confirm any findings from this testing.  Panorama testing was ordered through [facility] on 10/04/2016 and the results are pending.    [name] was able to have a detailed anatomy ultrasound at [facility] today.  Because she was 17 weeks, 5 days, some views of the fetal anatomy were suboptimally seen.  However, the remainder of the anatomy was seen and appeared normal. She is scheduled to return to our clinic in 2 weeks to follow up on the anatomy that was not visualized today.  See that report for details.   We reviewed that some babies with Down syndrome or other chromosome differences will show changes on this ultrasound, but many will not.  Therefore, ultrasound is not diagnostic for chromosome conditions.   If  [name] were to still feel concerned about the chance for Down syndrome in the pregnancy, an amniocentesis could be considered to more fully assess for chromosome conditions.  Amniocentesis is a diagnostic test to evaluate the fetal chromosomes during pregnancy.  The  procedure involves the removal of a small amount of amniotic fluid from the sac surrounding the fetus with the use of a thin needle inserted through the maternal abdomen and uterus.  Ultrasound guidance is used throughout the procedure.  Fetal cells are directly evaluated to detect greater than 99% of chromosome conditions and > 98% of neural tube defects.  The main risks to this procedure include complications which may lead to miscarriage in less than 1 in 200 cases (0.5%).  If the results of the Panorama testing are normal, then the chance that the pregnancy is affected with Down syndrome is expected to be less than the risk from amniocentesis.  We obtained a detailed family history and pregnancy history. This is the first pregnancy for [name] and her partner.  In the current pregnancy, the patient denied any exposures or complications that would increase the risk for birth defects.  In the family history, the father of the baby reported that his maternal uncle has developmental disabilities, possibly related to high fever in infancy or early childhood, though the family is not in agreement about this cause.  We discussed that there may be various reasons for developmental differences, including genetic syndromes, environmental exposures and other causes.  Without additional medical information, it is difficult to provide an accurate risk assessment for other family members.  If an evaluation has been performed, we are happy to discuss any results if desired.  The father of the baby also stated that his father and paternal grandmother both had "spina bifida".  However, neither required surgery in infancy and neither has any physical impairments such as paralysis, weakness in the lower extremities, or trouble with bowel or bladder control.  Both have features which sound like scoliosis with his father needing a back brace in middle school and having surgeries later in life.  No sacral dimples or open defects  are reported.  We talked about the fact that there may be various types of structural problems with the spine.  However, their medical course does not sound consistent with an open neural tube defect.  We cannot rule out spina bifida occulta (a closed defect), scoliosis, or other condition.  Nor can we provide an accurate recurrence risk assessment without additional diagnostic information.  A detailed ultrasound with adequate spine and brian images would be helpful as well as the MSAFP testing to assess for open neural tube defects. Lastly, [name] stated that one of her maternal great aunts and a cousin on that side has cystic fibrosis.  We reviewed the inherited of CF and the typical clinical presentation.  These relative both lived well into adulthood, with the cousin being in her 4s-60s when she passed away many years ago.  We talked about the fact that this seems like an unlikely course for classic CF, but that there may be more mild forms so we cannot rule out CF in these individuals.  We offered CF expanded carrier screening (97 mutations) or a review of medical records to determine if a mutation was identified in either relative.  The remainder of the family history was reported to be unremarkable for birth defects, mental retardation, recurrent pregnancy loss or known chromosome abnormalities.   [name] is awaiting  the Panorama results to make a decision regarding amniocentesis.  She stated that she desired carrier testing for CF, SMA and MSAFP testing for open neural tube defects.  However, she left our clinic after her ultrasound prior to having her labs drawn.  We can do these at her visit in 2 weeks if desired.  We appreciate the opportunity to be involved in the care of this family.  Ms.  Jane Jackson was encouraged to call us at [phone] with questions or concerns.   Wilburt Finlay, MS, CGC   Addendum 10/18/2016:  Results of the Panorama testing were received from [facility] on 10/16/2016  and given to the patient by phone.  The results were low risk for chromosomes 13, 18, 21 and monosomy X.  Microdeletion studies were also ordered and were low risk.  Fetal sex is female, fetal fraction 8.4%.  Upon receiving this result, the patient declined amniocentesis.  Per [facility], they also ordered the [facility] Carrier screening (which would include CF and SMA), but the results are not yet available.  This would mean testing for msAFP is the only outstanding test to be ordered.

## 2016-10-15 NOTE — Progress Notes (Signed)
Jane Wells, MS, CGC performed an integral service incident to the physician's initial service.  I was physically present in the clinical area and was immediately available to render assistance.   [name] [name]  

## 2016-10-18 ENCOUNTER — Other Ambulatory Visit: Payer: Self-pay | Admitting: Obstetrics and Gynecology

## 2016-10-18 DIAGNOSIS — O219 Vomiting of pregnancy, unspecified: Secondary | ICD-10-CM

## 2016-10-23 ENCOUNTER — Encounter: Payer: Self-pay | Admitting: Obstetrics and Gynecology

## 2016-10-25 ENCOUNTER — Encounter: Payer: Self-pay | Admitting: Obstetrics and Gynecology

## 2016-10-29 ENCOUNTER — Other Ambulatory Visit: Payer: Self-pay | Admitting: *Deleted

## 2016-10-29 DIAGNOSIS — Z0489 Encounter for examination and observation for other specified reasons: Secondary | ICD-10-CM

## 2016-10-29 DIAGNOSIS — IMO0002 Reserved for concepts with insufficient information to code with codable children: Secondary | ICD-10-CM

## 2016-10-30 ENCOUNTER — Other Ambulatory Visit: Payer: Self-pay | Admitting: Obstetrics and Gynecology

## 2016-10-30 ENCOUNTER — Ambulatory Visit (INDEPENDENT_AMBULATORY_CARE_PROVIDER_SITE_OTHER): Payer: Medicaid Other | Admitting: Obstetrics and Gynecology

## 2016-10-30 ENCOUNTER — Other Ambulatory Visit: Payer: Medicaid Other

## 2016-10-30 ENCOUNTER — Encounter: Payer: Medicaid Other | Admitting: Obstetrics and Gynecology

## 2016-10-30 VITALS — BP 117/73 | HR 99 | Wt 277.1 lb

## 2016-10-30 DIAGNOSIS — Z131 Encounter for screening for diabetes mellitus: Secondary | ICD-10-CM

## 2016-10-30 DIAGNOSIS — O289 Unspecified abnormal findings on antenatal screening of mother: Secondary | ICD-10-CM

## 2016-10-30 DIAGNOSIS — Z3482 Encounter for supervision of other normal pregnancy, second trimester: Secondary | ICD-10-CM

## 2016-10-30 DIAGNOSIS — O219 Vomiting of pregnancy, unspecified: Secondary | ICD-10-CM

## 2016-10-30 DIAGNOSIS — R8290 Unspecified abnormal findings in urine: Secondary | ICD-10-CM

## 2016-10-30 LAB — POCT URINALYSIS DIPSTICK
Bilirubin, UA: NEGATIVE
GLUCOSE UA: NEGATIVE
Ketones, UA: NEGATIVE
Leukocytes, UA: NEGATIVE
NITRITE UA: NEGATIVE
Spec Grav, UA: 1.015
UROBILINOGEN UA: NEGATIVE
pH, UA: 6.5

## 2016-10-30 MED ORDER — PROMETHAZINE HCL 25 MG RE SUPP: 25.0000 mg | Freq: Four times a day (QID) | RECTAL | 3 refills | Status: DC | PRN

## 2016-10-30 MED ORDER — ACCU-CHEK AVIVA CONNECT W/DEVICE KIT: 1.0000 | PACK | Freq: Four times a day (QID) | 0 refills | Status: DC

## 2016-10-30 MED ORDER — ACCU-CHEK FASTCLIX LANCETS MISC: 12 refills | Status: DC

## 2016-10-30 MED ORDER — GLUCOSE BLOOD VI STRP: ORAL_STRIP | 12 refills | Status: DC

## 2016-10-31 LAB — URINALYSIS, ROUTINE W REFLEX MICROSCOPIC
BILIRUBIN UA: NEGATIVE
GLUCOSE, UA: NEGATIVE
LEUKOCYTES UA: NEGATIVE
Nitrite, UA: NEGATIVE
RBC UA: NEGATIVE
SPEC GRAV UA: 1.021 (ref 1.005–1.030)
Urobilinogen, Ur: 0.2 mg/dL (ref 0.2–1.0)
pH, UA: 6.5 (ref 5.0–7.5)

## 2016-11-01 ENCOUNTER — Ambulatory Visit
Admission: RE | Admit: 2016-11-01 | Discharge: 2016-11-01 | Disposition: A | Discharge status: Home / Self Care | Payer: Medicaid Other | Source: Ambulatory Visit | Attending: Maternal & Fetal Medicine | Admitting: Maternal & Fetal Medicine

## 2016-11-01 DIAGNOSIS — Z0489 Encounter for examination and observation for other specified reasons: Secondary | ICD-10-CM

## 2016-11-01 DIAGNOSIS — IMO0002 Reserved for concepts with insufficient information to code with codable children: Secondary | ICD-10-CM

## 2016-11-01 DIAGNOSIS — Z362 Encounter for other antenatal screening follow-up: Secondary | ICD-10-CM | POA: Insufficient documentation

## 2016-11-01 DIAGNOSIS — Z3A20 20 weeks gestation of pregnancy: Secondary | ICD-10-CM | POA: Diagnosis not present

## 2016-11-01 LAB — URINE CULTURE: Organism ID, Bacteria: NO GROWTH

## 2016-11-01 NOTE — Progress Notes (Signed)
ROB: Patient s/p consultation with [facility] for abnormal 1st trimester screen. Patient had cell free DNA testing which was normal.  Patient declined amniocentesis.  Had normal anatomy scan.  Recommended msAFP (to be performed today), and to return for detailed scan at [facility] in 2 days.   Patient notes that the nausea and vomiting is still ongoing, but is now able to tolerate Boost. Is taking supplements 2-3 times daily.  Is taking the Zofran and Phenergan and Reglan. States that she feels tired and dehydrated. Has been trying to drink water but is not really able to keep it down. Advised on Pedialyte/Gatorade/Propel water.  If patient still not able to tolerate, can come in 2 days to receive IV fluids in office.  Still unable to complete early glucola due to nausea/vomiting, will have patient check blood sugars 4 x daily for 1 week.  RTC in 3-4 weeks, or sooner as needed.

## 2016-11-02 ENCOUNTER — Encounter: Payer: Self-pay | Admitting: Certified Nurse Midwife

## 2016-11-02 ENCOUNTER — Ambulatory Visit (INDEPENDENT_AMBULATORY_CARE_PROVIDER_SITE_OTHER): Payer: Medicaid Other | Admitting: Certified Nurse Midwife

## 2016-11-02 VITALS — BP 108/64 | HR 91 | Wt 275.4 lb

## 2016-11-02 DIAGNOSIS — O219 Vomiting of pregnancy, unspecified: Secondary | ICD-10-CM

## 2016-11-02 NOTE — Progress Notes (Signed)
Work-in visit-IV fluids. IV to placed by Philip Aspen, CNM to left hand on second attempt. One (1) Liter LR followed by one (1) Liter D5LR. Pt feeling better able to urinate. Small ketones present. Able to tolerate small sips of water. IV discontinued with catheter intake. Reviewed red flag symptoms and when to call. Keep previously scheduled appointment.

## 2016-11-02 NOTE — Progress Notes (Signed)
OB workin- IV fluids only for n/v. Pt unable to void at this time.

## 2016-11-06 LAB — AFP, SERUM, OPEN SPINA BIFIDA
AFP MOM: 0.96
AFP Value: 35.8 ng/mL
Gest. Age on Collection Date: 19.6 weeks
Maternal Age At EDD: 33.4 years
OSBR RISK 1 IN: 10000
TEST RESULTS AFP: NEGATIVE
WEIGHT: 277 [lb_av]

## 2016-11-26 ENCOUNTER — Other Ambulatory Visit: Payer: Self-pay | Admitting: Obstetrics and Gynecology

## 2016-11-26 DIAGNOSIS — O219 Vomiting of pregnancy, unspecified: Secondary | ICD-10-CM

## 2016-11-28 ENCOUNTER — Ambulatory Visit (INDEPENDENT_AMBULATORY_CARE_PROVIDER_SITE_OTHER): Payer: Medicaid Other | Admitting: Obstetrics and Gynecology

## 2016-11-28 VITALS — BP 126/81 | HR 109 | Wt 278.2 lb

## 2016-11-28 DIAGNOSIS — O2612 Low weight gain in pregnancy, second trimester: Secondary | ICD-10-CM | POA: Diagnosis not present

## 2016-11-28 DIAGNOSIS — Z3482 Encounter for supervision of other normal pregnancy, second trimester: Secondary | ICD-10-CM | POA: Diagnosis not present

## 2016-11-28 DIAGNOSIS — Z6841 Body Mass Index (BMI) 40.0 and over, adult: Secondary | ICD-10-CM | POA: Diagnosis not present

## 2016-11-28 DIAGNOSIS — O219 Vomiting of pregnancy, unspecified: Secondary | ICD-10-CM

## 2016-11-28 LAB — POCT URINALYSIS DIPSTICK
BILIRUBIN UA: NEGATIVE
GLUCOSE UA: NEGATIVE
KETONES UA: NEGATIVE
Leukocytes, UA: NEGATIVE
NITRITE UA: NEGATIVE
Spec Grav, UA: 1.025
Urobilinogen, UA: NEGATIVE
pH, UA: 6

## 2016-11-28 MED ORDER — PROMETHAZINE HCL 50 MG PO TABS: 50.0000 mg | ORAL_TABLET | Freq: Three times a day (TID) | ORAL | 3 refills | Status: DC | PRN

## 2016-11-28 MED ORDER — ONDANSETRON 8 MG PO TBDP: 8.0000 mg | ORAL_TABLET | Freq: Three times a day (TID) | ORAL | 3 refills | Status: DC | PRN

## 2016-11-28 NOTE — Progress Notes (Signed)
ROB: Patient states that she is tired of being pregnancy.  Is still experiencing significant nausea/vomiting in pregnancy, despite alternating Zofran and Phenergan.  Attempted to try Phenergan suppository, but notes pelvic cramping and GI disturbances.  Has been trying to take in more fluids but vomits this up as well and can only tolerate a few sips of Gatorade. Discontinued Reglan as she felt that this was not helping. Patient very tearful, notes she is afraid of harming her baby due to poor weight gain and all of the medications. Discussed that her recent anatomy scan noted normal growth, and that the fundal height has been appropriate.  Stressed that the baby would simply take what it needs from her, which can make her feel miserable at times.  Desires to discuss possibility of a Phenergan pump.  Will look into insurance but since patient has never been admitted and has been treated outpatient for dehydration, unsure if she will be approved.  Will increase Phenergan and Zofran dosing,  Advised patient to consider use of steroids since nothing else has helped. Will have patient f/u in clinic in 2-3 days for IVF if needed.  CBC and BMP ordered, but is after hours in office.  Patient will need to return another day for lab draw.  RTC in 4 weeks.  Will attempt glucola at that time, but if unable to tolerate glucola, patient will need to perform 1 week of glucose monitoring 4 x daily.  For growth scan next visit due to poor weight gain in pregnancy.    A total of 25 minutes were spent face-to-face with the patient during this encounter and over half of that time involved counseling and coordination of care.

## 2016-12-03 ENCOUNTER — Other Ambulatory Visit: Payer: Medicaid Other

## 2016-12-03 ENCOUNTER — Other Ambulatory Visit: Payer: Self-pay

## 2016-12-03 DIAGNOSIS — O210 Mild hyperemesis gravidarum: Secondary | ICD-10-CM

## 2016-12-24 ENCOUNTER — Encounter: Payer: Self-pay | Admitting: Obstetrics and Gynecology

## 2016-12-26 ENCOUNTER — Other Ambulatory Visit: Payer: Medicaid Other

## 2016-12-26 ENCOUNTER — Ambulatory Visit: Payer: Medicaid Other

## 2016-12-26 ENCOUNTER — Encounter: Payer: Medicaid Other | Admitting: Obstetrics and Gynecology

## 2017-01-03 ENCOUNTER — Ambulatory Visit (INDEPENDENT_AMBULATORY_CARE_PROVIDER_SITE_OTHER): Payer: Medicaid Other

## 2017-01-03 ENCOUNTER — Ambulatory Visit (INDEPENDENT_AMBULATORY_CARE_PROVIDER_SITE_OTHER): Payer: Medicaid Other | Admitting: Obstetrics and Gynecology

## 2017-01-03 VITALS — BP 120/81 | HR 110 | Wt 283.0 lb

## 2017-01-03 DIAGNOSIS — Z3482 Encounter for supervision of other normal pregnancy, second trimester: Secondary | ICD-10-CM | POA: Diagnosis not present

## 2017-01-03 DIAGNOSIS — O2612 Low weight gain in pregnancy, second trimester: Secondary | ICD-10-CM

## 2017-01-03 DIAGNOSIS — O219 Vomiting of pregnancy, unspecified: Secondary | ICD-10-CM

## 2017-01-03 DIAGNOSIS — O99519 Diseases of the respiratory system complicating pregnancy, unspecified trimester: Secondary | ICD-10-CM

## 2017-01-03 DIAGNOSIS — Z130 Encounter for screening for diseases of the blood and blood-forming organs and certain disorders involving the immune mechanism: Secondary | ICD-10-CM

## 2017-01-03 DIAGNOSIS — J45909 Unspecified asthma, uncomplicated: Secondary | ICD-10-CM

## 2017-01-03 DIAGNOSIS — Z3483 Encounter for supervision of other normal pregnancy, third trimester: Secondary | ICD-10-CM

## 2017-01-03 LAB — POCT URINALYSIS DIPSTICK
Bilirubin, UA: NEGATIVE
Blood, UA: NEGATIVE
GLUCOSE UA: NEGATIVE
Ketones, UA: NEGATIVE
LEUKOCYTES UA: NEGATIVE
NITRITE UA: NEGATIVE
Spec Grav, UA: 1.02 (ref 1.010–1.025)
UROBILINOGEN UA: 0.2 U/dL
pH, UA: 6 (ref 5.0–8.0)

## 2017-01-03 MED ORDER — ALBUTEROL SULFATE 1.25 MG/3ML IN NEBU: 3.0000 mL | INHALATION_SOLUTION | Freq: Two times a day (BID) | RESPIRATORY_TRACT | 3 refills | Status: DC | PRN

## 2017-01-03 MED ORDER — ALBUTEROL SULFATE HFA 108 (90 BASE) MCG/ACT IN AERS: INHALATION_SPRAY | RESPIRATORY_TRACT | 3 refills | Status: DC

## 2017-01-03 MED ORDER — CITRANATAL B-CALM 20-1 MG & 2 X 25 MG PO MISC: 1.0000 | Freq: Every day | ORAL | 2 refills | Status: DC

## 2017-01-03 MED ORDER — PROMETHAZINE HCL 50 MG PO TABS: 50.0000 mg | ORAL_TABLET | Freq: Three times a day (TID) | ORAL | 3 refills | Status: DC | PRN

## 2017-01-03 MED ORDER — PROMETHAZINE HCL 25 MG PO TABS: 25.0000 mg | ORAL_TABLET | Freq: Four times a day (QID) | ORAL | 3 refills | Status: DC | PRN

## 2017-01-03 NOTE — Patient Instructions (Signed)
Third Trimester of Pregnancy The third trimester is from week 28 through week 40 (months 7 through 9). The third trimester is a time when the unborn baby (fetus) is growing rapidly. At the end of the ninth month, the fetus is about 20 inches in length and weighs 6-10 pounds. Body changes during your third trimester Your body will continue to go through many changes during pregnancy. The changes vary from woman to woman. During the third trimester:  Your weight will continue to increase. You can expect to gain 25-35 pounds (11-16 kg) by the end of the pregnancy.  You may begin to get stretch marks on your hips, abdomen, and breasts.  You may urinate more often because the fetus is moving lower into your pelvis and pressing on your bladder.  You may develop or continue to have heartburn. This is caused by increased hormones that slow down muscles in the digestive tract.  You may develop or continue to have constipation because increased hormones slow digestion and cause the muscles that push waste through your intestines to relax.  You may develop hemorrhoids. These are swollen veins (varicose veins) in the rectum that can itch or be painful.  You may develop swollen, bulging veins (varicose veins) in your legs.  You may have increased body aches in the pelvis, back, or thighs. This is due to weight gain and increased hormones that are relaxing your joints.  You may have changes in your hair. These can include thickening of your hair, rapid growth, and changes in texture. Some women also have hair loss during or after pregnancy, or hair that feels dry or thin. Your hair will most likely return to normal after your baby is born.  Your breasts will continue to grow and they will continue to become tender. A yellow fluid (colostrum) may leak from your breasts. This is the first milk you are producing for your baby.  Your belly button may stick out.  You may notice more swelling in your hands,  face, or ankles.  You may have increased tingling or numbness in your hands, arms, and legs. The skin on your belly may also feel numb.  You may feel short of breath because of your expanding uterus.  You may have more problems sleeping. This can be caused by the size of your belly, increased need to urinate, and an increase in your body's metabolism.  You may notice the fetus "dropping," or moving lower in your abdomen (lightening).  You may have increased vaginal discharge.  You may notice your joints feel loose and you may have pain around your pelvic bone.  What to expect at prenatal visits You will have prenatal exams every 2 weeks until week 36. Then you will have weekly prenatal exams. During a routine prenatal visit:  You will be weighed to make sure you and the baby are growing normally.  Your blood pressure will be taken.  Your abdomen will be measured to track your baby's growth.  The fetal heartbeat will be listened to.  Any test results from the previous visit will be discussed.  You may have a cervical check near your due date to see if your cervix has softened or thinned (effaced).  You will be tested for Group B streptococcus. This happens between 35 and 37 weeks.  Your health care provider may ask you:  What your birth plan is.  How you are feeling.  If you are feeling the baby move.  If you have had   any abnormal symptoms, such as leaking fluid, bleeding, severe headaches, or abdominal cramping.  If you are using any tobacco products, including cigarettes, chewing tobacco, and electronic cigarettes.  If you have any questions.  Other tests or screenings that may be performed during your third trimester include:  Blood tests that check for low iron levels (anemia).  Fetal testing to check the health, activity level, and growth of the fetus. Testing is done if you have certain medical conditions or if there are problems during the  pregnancy.  Nonstress test (NST). This test checks the health of your baby to make sure there are no signs of problems, such as the baby not getting enough oxygen. During this test, a belt is placed around your belly. The baby is made to move, and its heart rate is monitored during movement.  What is false labor? False labor is a condition in which you feel small, irregular tightenings of the muscles in the womb (contractions) that usually go away with rest, changing position, or drinking water. These are called Braxton Hicks contractions. Contractions may last for hours, days, or even weeks before true labor sets in. If contractions come at regular intervals, become more frequent, increase in intensity, or become painful, you should see your health care provider. What are the signs of labor?  Abdominal cramps.  Regular contractions that start at 10 minutes apart and become stronger and more frequent with time.  Contractions that start on the top of the uterus and spread down to the lower abdomen and back.  Increased pelvic pressure and dull back pain.  A watery or bloody mucus discharge that comes from the vagina.  Leaking of amniotic fluid. This is also known as your "water breaking." It could be a slow trickle or a gush. Let your health care provider know if it has a color or strange odor. If you have any of these signs, call your health care provider right away, even if it is before your due date. Follow these instructions at home: Medicines  Follow your health care provider's instructions regarding medicine use. Specific medicines may be either safe or unsafe to take during pregnancy.  Take a prenatal vitamin that contains at least 600 micrograms (mcg) of folic acid.  If you develop constipation, try taking a stool softener if your health care provider approves. Eating and drinking  Eat a balanced diet that includes fresh fruits and vegetables, whole grains, good sources of protein  such as meat, eggs, or tofu, and low-fat dairy. Your health care provider will help you determine the amount of weight gain that is right for you.  Avoid raw meat and uncooked cheese. These carry germs that can cause birth defects in the baby.  If you have low calcium intake from food, talk to your health care provider about whether you should take a daily calcium supplement.  Eat four or five small meals rather than three large meals a day.  Limit foods that are high in fat and processed sugars, such as fried and sweet foods.  To prevent constipation: ? Drink enough fluid to keep your urine clear or pale yellow. ? Eat foods that are high in fiber, such as fresh fruits and vegetables, whole grains, and beans. Activity  Exercise only as directed by your health care provider. Most women can continue their usual exercise routine during pregnancy. Try to exercise for 30 minutes at least 5 days a week. Stop exercising if you experience uterine contractions.  Avoid heavy   lifting.  Do not exercise in extreme heat or humidity, or at high altitudes.  Wear low-heel, comfortable shoes.  Practice good posture.  You may continue to have sex unless your health care provider tells you otherwise. Relieving pain and discomfort  Take frequent breaks and rest with your legs elevated if you have leg cramps or low back pain.  Take warm sitz baths to soothe any pain or discomfort caused by hemorrhoids. Use hemorrhoid cream if your health care provider approves.  Wear a good support bra to prevent discomfort from breast tenderness.  If you develop varicose veins: ? Wear support pantyhose or compression stockings as told by your healthcare provider. ? Elevate your feet for 15 minutes, 3-4 times a day. Prenatal care  Write down your questions. Take them to your prenatal visits.  Keep all your prenatal visits as told by your health care provider. This is important. Safety  Wear your seat belt at  all times when driving.  Make a list of emergency phone numbers, including numbers for family, friends, the hospital, and police and fire departments. General instructions  Avoid cat litter boxes and soil used by cats. These carry germs that can cause birth defects in the baby. If you have a cat, ask someone to clean the litter box for you.  Do not travel far distances unless it is absolutely necessary and only with the approval of your health care provider.  Do not use hot tubs, steam rooms, or saunas.  Do not drink alcohol.  Do not use any products that contain nicotine or tobacco, such as cigarettes and e-cigarettes. If you need help quitting, ask your health care provider.  Do not use any medicinal herbs or unprescribed drugs. These chemicals affect the formation and growth of the baby.  Do not douche or use tampons or scented sanitary pads.  Do not cross your legs for long periods of time.  To prepare for the arrival of your baby: ? Take prenatal classes to understand, practice, and ask questions about labor and delivery. ? Make a trial run to the hospital. ? Visit the hospital and tour the maternity area. ? Arrange for maternity or paternity leave through employers. ? Arrange for family and friends to take care of pets while you are in the hospital. ? Purchase a rear-facing car seat and make sure you know how to install it in your car. ? Pack your hospital bag. ? Prepare the baby's nursery. Make sure to remove all pillows and stuffed animals from the baby's crib to prevent suffocation.  Visit your dentist if you have not gone during your pregnancy. Use a soft toothbrush to brush your teeth and be gentle when you floss. Contact a health care provider if:  You are unsure if you are in labor or if your water has broken.  You become dizzy.  You have mild pelvic cramps, pelvic pressure, or nagging pain in your abdominal area.  You have lower back pain.  You have persistent  nausea, vomiting, or diarrhea.  You have an unusual or bad smelling vaginal discharge.  You have pain when you urinate. Get help right away if:  Your water breaks before 37 weeks.  You have regular contractions less than 5 minutes apart before 37 weeks.  You have a fever.  You are leaking fluid from your vagina.  You have spotting or bleeding from your vagina.  You have severe abdominal pain or cramping.  You have rapid weight loss or weight gain.    You have shortness of breath with chest pain.  You notice sudden or extreme swelling of your face, hands, ankles, feet, or legs.  Your baby makes fewer than 10 movements in 2 hours.  You have severe headaches that do not go away when you take medicine.  You have vision changes. Summary  The third trimester is from week 28 through week 40, months 7 through 9. The third trimester is a time when the unborn baby (fetus) is growing rapidly.  During the third trimester, your discomfort may increase as you and your baby continue to gain weight. You may have abdominal, leg, and back pain, sleeping problems, and an increased need to urinate.  During the third trimester your breasts will keep growing and they will continue to become tender. A yellow fluid (colostrum) may leak from your breasts. This is the first milk you are producing for your baby.  False labor is a condition in which you feel small, irregular tightenings of the muscles in the womb (contractions) that eventually go away. These are called Braxton Hicks contractions. Contractions may last for hours, days, or even weeks before true labor sets in.  Signs of labor can include: abdominal cramps; regular contractions that start at 10 minutes apart and become stronger and more frequent with time; watery or bloody mucus discharge that comes from the vagina; increased pelvic pressure and dull back pain; and leaking of amniotic fluid. This information is not intended to replace advice  given to you by your health care provider. Make sure you discuss any questions you have with your health care provider. Document Released: 09/04/2001 Document Revised: 02/16/2016 Document Reviewed: 11/11/2012 Elsevier Interactive Patient Education  2017 [employer]  

## 2017-01-03 NOTE — Progress Notes (Signed)
ROB: Patient notes that her diet is just beginning to improve.  Is able to eat a few things , like strawberries and a few other fruits.  Is using Boost.  Growth scan done today for poor maternal weight gain, normal EFW noted (58%ile). Continued to encourage use of Boost.  Patient notes that she does not think she will be able to tolerate 1 hour glucola, will check blood sugars x 1 week and return blood sugar log. Desires to breastfeed, desires unsure method for contraception. Signed blood consent, discussed cord blood banking. RTC in 2 weeks.

## 2017-01-07 NOTE — Addendum Note (Signed)
Addended by: Augusto Gamble on: 01/07/2017 09:44 PM   Modules accepted: Orders

## 2017-01-11 DIAGNOSIS — O2612 Low weight gain in pregnancy, second trimester: Secondary | ICD-10-CM | POA: Insufficient documentation

## 2017-01-21 ENCOUNTER — Encounter: Payer: Medicaid Other | Admitting: Obstetrics and Gynecology

## 2017-01-23 ENCOUNTER — Ambulatory Visit (INDEPENDENT_AMBULATORY_CARE_PROVIDER_SITE_OTHER): Payer: Medicaid Other | Admitting: Obstetrics and Gynecology

## 2017-01-23 ENCOUNTER — Encounter: Payer: Self-pay | Admitting: Obstetrics and Gynecology

## 2017-01-23 VITALS — BP 116/76 | HR 118 | Wt 285.1 lb

## 2017-01-23 DIAGNOSIS — Z3493 Encounter for supervision of normal pregnancy, unspecified, third trimester: Secondary | ICD-10-CM

## 2017-01-23 DIAGNOSIS — Z130 Encounter for screening for diseases of the blood and blood-forming organs and certain disorders involving the immune mechanism: Secondary | ICD-10-CM

## 2017-01-23 DIAGNOSIS — Z6841 Body Mass Index (BMI) 40.0 and over, adult: Secondary | ICD-10-CM

## 2017-01-23 DIAGNOSIS — Z23 Encounter for immunization: Secondary | ICD-10-CM | POA: Diagnosis not present

## 2017-01-23 LAB — POCT URINALYSIS DIPSTICK
Bilirubin, UA: NEGATIVE
Blood, UA: NEGATIVE
GLUCOSE UA: NEGATIVE
Ketones, UA: NEGATIVE
LEUKOCYTES UA: NEGATIVE
NITRITE UA: NEGATIVE
Spec Grav, UA: 1.025 (ref 1.010–1.025)
UROBILINOGEN UA: 0.2 U/dL
pH, UA: 5 (ref 5.0–8.0)

## 2017-01-23 NOTE — Addendum Note (Signed)
Addended by: Elouise Munroe on: 01/23/2017 05:15 PM   Modules accepted: Orders

## 2017-01-23 NOTE — Progress Notes (Signed)
ROB:  No problems.  Discussed B-[facility] ctx and S&S of labor.  Discussed GBS.  Eating without problem.

## 2017-01-24 LAB — CBC
HEMOGLOBIN: 12.8 g/dL (ref 11.1–15.9)
Hematocrit: 37.1 % (ref 34.0–46.6)
MCH: 30.7 pg (ref 26.6–33.0)
MCHC: 34.5 g/dL (ref 31.5–35.7)
MCV: 89 fL (ref 79–97)
Platelets: 304 10*3/uL (ref 150–379)
RBC: 4.17 x10E6/uL (ref 3.77–5.28)
RDW: 13.6 % (ref 12.3–15.4)
WBC: 13.7 10*3/uL — ABNORMAL HIGH (ref 3.4–10.8)

## 2017-02-06 ENCOUNTER — Ambulatory Visit (INDEPENDENT_AMBULATORY_CARE_PROVIDER_SITE_OTHER): Payer: Medicaid Other | Admitting: Obstetrics and Gynecology

## 2017-02-06 DIAGNOSIS — Z3483 Encounter for supervision of other normal pregnancy, third trimester: Secondary | ICD-10-CM

## 2017-02-06 DIAGNOSIS — O219 Vomiting of pregnancy, unspecified: Secondary | ICD-10-CM

## 2017-02-06 LAB — POCT URINALYSIS DIPSTICK
Bilirubin, UA: NEGATIVE
Blood, UA: NEGATIVE
Glucose, UA: NEGATIVE
KETONES UA: NEGATIVE
Nitrite, UA: NEGATIVE
PH UA: 6 (ref 5.0–8.0)
Spec Grav, UA: 1.025 (ref 1.010–1.025)
Urobilinogen, UA: 0.2 E.U./dL

## 2017-02-06 MED ORDER — PROMETHAZINE HCL 25 MG PO TABS: ORAL_TABLET | ORAL | 3 refills | Status: DC

## 2017-02-06 MED ORDER — ONDANSETRON HCL 4 MG PO TABS: ORAL_TABLET | ORAL | 2 refills | Status: DC

## 2017-02-06 MED ORDER — PROMETHAZINE HCL 50 MG PO TABS: 50.0000 mg | ORAL_TABLET | Freq: Three times a day (TID) | ORAL | 3 refills | Status: DC | PRN

## 2017-02-06 NOTE — Progress Notes (Signed)
ROB: Patient notes she is feeling better overall, being able to tolerate a few more foods.  Discussed finding pediatrician, has had birthing tour and is attending classes.  RTC in 2 weeks, for 36 week labs at that time.

## 2017-02-12 ENCOUNTER — Telehealth: Payer: Self-pay | Admitting: Obstetrics and Gynecology

## 2017-02-12 ENCOUNTER — Ambulatory Visit (INDEPENDENT_AMBULATORY_CARE_PROVIDER_SITE_OTHER): Payer: Medicaid Other | Admitting: Obstetrics and Gynecology

## 2017-02-12 VITALS — BP 122/78 | HR 99

## 2017-02-12 DIAGNOSIS — R55 Syncope and collapse: Secondary | ICD-10-CM

## 2017-02-12 DIAGNOSIS — I499 Cardiac arrhythmia, unspecified: Secondary | ICD-10-CM

## 2017-02-12 DIAGNOSIS — E860 Dehydration: Secondary | ICD-10-CM

## 2017-02-12 DIAGNOSIS — Z8679 Personal history of other diseases of the circulatory system: Secondary | ICD-10-CM

## 2017-02-12 LAB — POCT URINALYSIS DIPSTICK
Bilirubin, UA: NEGATIVE
Glucose, UA: NEGATIVE
KETONES UA: 5
Leukocytes, UA: NEGATIVE
Nitrite, UA: NEGATIVE
PH UA: 6 (ref 5.0–8.0)
RBC UA: NEGATIVE
SPEC GRAV UA: 1.02 (ref 1.010–1.025)
UROBILINOGEN UA: 0.2 U/dL

## 2017-02-12 NOTE — Progress Notes (Signed)
Problem OB visit: Patient complains of feeling weak and dizzy over the past few days. Notes that on Sunday she passed out. Notes that she has felt her heart rate goes up and down, and when she checks her to admit her heart rate ranges between the 150s and then drops to the 40s. Patient is also noting bilateral swelling and edema of the legs. Began feeling that something was "not quite right" earlier today and went to check her blood pressure, noted that it was in the 170s over 100s. States that she checked it twice and decided to come in today for evaluation. Is also noting decreased fetal movement today.  NST performed today was reviewed and was found to be reactive. BPs ranged 115-135/56-80 with pulse ranging 70s-90s. No pitting edema present.  Patient with a h/o MVP, heart and lung exam were normal.  Has not seen a Cardiologist in years.  UA with only trace ketones and protein.  Patient notes she does not drink as much as she should due to the nausea/vomiting (drinks 4 cups of water per day). Does not require IV fluids at this time.  Advised on supplementing with popsicles; has anti-emetics prescribed.  Will also get electrolytes, EKG and referral to Cardiology for further evaluation and possibly Holter monitoring.  Advised to go to the ER if symptoms worsen. Also discusses s/s of pre-eclampsia. Continue routine prenatal care.    NONSTRESS TEST INTERPRETATION  INDICATIONS: Decreased fetal movement  FHR baseline: 120 RESULTS:Reactive COMMENTS: No contractions   PLAN: 1. Continue fetal kick counts twice a day. 2. Continue antepartum testing as scheduled-Biweekly   Rubie Maid, MD [facility]

## 2017-02-13 ENCOUNTER — Ambulatory Visit
Admission: RE | Admit: 2017-02-13 | Discharge: 2017-02-13 | Disposition: A | Discharge status: Home / Self Care | Payer: Medicaid Other | Source: Ambulatory Visit | Attending: Obstetrics and Gynecology | Admitting: Obstetrics and Gynecology

## 2017-02-13 ENCOUNTER — Encounter: Payer: Self-pay | Admitting: Obstetrics and Gynecology

## 2017-02-13 ENCOUNTER — Other Ambulatory Visit: Payer: Self-pay | Admitting: Obstetrics and Gynecology

## 2017-02-13 DIAGNOSIS — R9431 Abnormal electrocardiogram [ECG] [EKG]: Secondary | ICD-10-CM | POA: Diagnosis not present

## 2017-02-13 DIAGNOSIS — R000 Tachycardia, unspecified: Secondary | ICD-10-CM | POA: Diagnosis not present

## 2017-02-13 DIAGNOSIS — I499 Cardiac arrhythmia, unspecified: Secondary | ICD-10-CM | POA: Diagnosis present

## 2017-02-13 DIAGNOSIS — I252 Old myocardial infarction: Secondary | ICD-10-CM | POA: Insufficient documentation

## 2017-02-13 LAB — BASIC METABOLIC PANEL
BUN/Creatinine Ratio: 8 — ABNORMAL LOW (ref 9–23)
BUN: 5 mg/dL — AB (ref 6–20)
CHLORIDE: 105 mmol/L (ref 96–106)
CO2: 17 mmol/L — ABNORMAL LOW (ref 18–29)
Calcium: 9.1 mg/dL (ref 8.7–10.2)
Creatinine, Ser: 0.61 mg/dL (ref 0.57–1.00)
GFR calc non Af Amer: 119 mL/min/{1.73_m2} (ref >59)
GFR, EST AFRICAN AMERICAN: 138 mL/min/{1.73_m2} (ref >59)
Glucose: 83 mg/dL (ref 65–99)
Potassium: 4.4 mmol/L (ref 3.5–5.2)
SODIUM: 137 mmol/L (ref 134–144)

## 2017-02-15 NOTE — Telephone Encounter (Signed)
ERROR

## 2017-02-19 ENCOUNTER — Other Ambulatory Visit: Payer: Self-pay | Admitting: Obstetrics and Gynecology

## 2017-02-19 DIAGNOSIS — R000 Tachycardia, unspecified: Secondary | ICD-10-CM

## 2017-02-20 ENCOUNTER — Ambulatory Visit (INDEPENDENT_AMBULATORY_CARE_PROVIDER_SITE_OTHER): Payer: Medicaid Other | Admitting: Obstetrics and Gynecology

## 2017-02-20 VITALS — BP 148/84 | HR 121 | Wt 291.8 lb

## 2017-02-20 DIAGNOSIS — Z113 Encounter for screening for infections with a predominantly sexual mode of transmission: Secondary | ICD-10-CM

## 2017-02-20 DIAGNOSIS — I499 Cardiac arrhythmia, unspecified: Secondary | ICD-10-CM

## 2017-02-20 DIAGNOSIS — Z8679 Personal history of other diseases of the circulatory system: Secondary | ICD-10-CM

## 2017-02-20 DIAGNOSIS — Z3685 Encounter for antenatal screening for Streptococcus B: Secondary | ICD-10-CM

## 2017-02-20 DIAGNOSIS — Z3483 Encounter for supervision of other normal pregnancy, third trimester: Secondary | ICD-10-CM

## 2017-02-20 DIAGNOSIS — O163 Unspecified maternal hypertension, third trimester: Secondary | ICD-10-CM

## 2017-02-20 LAB — POCT URINALYSIS DIPSTICK
BILIRUBIN UA: NEGATIVE
Glucose, UA: NEGATIVE
Ketones, UA: NEGATIVE
Nitrite, UA: NEGATIVE
SPEC GRAV UA: 1.02 (ref 1.010–1.025)
Urobilinogen, UA: 0.2 E.U./dL
pH, UA: 6 (ref 5.0–8.0)

## 2017-02-21 ENCOUNTER — Ambulatory Visit
Admission: RE | Admit: 2017-02-21 | Discharge: 2017-02-21 | Disposition: A | Discharge status: Home / Self Care | Payer: Medicaid Other | Source: Ambulatory Visit | Attending: Obstetrics and Gynecology | Admitting: Obstetrics and Gynecology

## 2017-02-21 DIAGNOSIS — R000 Tachycardia, unspecified: Secondary | ICD-10-CM | POA: Diagnosis not present

## 2017-02-21 LAB — RENAL FUNCTION PANEL
ALBUMIN: 3.6 g/dL (ref 3.5–5.5)
BUN/Creatinine Ratio: 9 (ref 9–23)
BUN: 6 mg/dL (ref 6–20)
CO2: 16 mmol/L — ABNORMAL LOW (ref 18–29)
Calcium: 10 mg/dL (ref 8.7–10.2)
Chloride: 102 mmol/L (ref 96–106)
Creatinine, Ser: 0.65 mg/dL (ref 0.57–1.00)
GFR calc non Af Amer: 117 mL/min/{1.73_m2} (ref >59)
GFR, EST AFRICAN AMERICAN: 135 mL/min/{1.73_m2} (ref >59)
GLUCOSE: 101 mg/dL — AB (ref 65–99)
POTASSIUM: 4.1 mmol/L (ref 3.5–5.2)
Phosphorus: 4.2 mg/dL (ref 2.5–4.5)
Sodium: 136 mmol/L (ref 134–144)

## 2017-02-21 LAB — HEPATIC FUNCTION PANEL
ALT: 7 IU/L (ref 0–32)
AST: 12 IU/L (ref 0–40)
Alkaline Phosphatase: 125 IU/L — ABNORMAL HIGH (ref 39–117)
BILIRUBIN, DIRECT: 0.08 mg/dL (ref 0.00–0.40)
Bilirubin Total: 0.2 mg/dL (ref 0.0–1.2)
Total Protein: 6.3 g/dL (ref 6.0–8.5)

## 2017-02-21 LAB — PROTEIN / CREATININE RATIO, URINE
Creatinine, Urine: 208.8 mg/dL
Protein, Ur: 36.2 mg/dL
Protein/Creat Ratio: 173 mg/g creat (ref 0–200)

## 2017-02-21 LAB — CREATINE

## 2017-02-21 LAB — URIC ACID: Uric Acid: 5.6 mg/dL (ref 2.5–7.1)

## 2017-02-22 LAB — URINE CULTURE

## 2017-02-23 ENCOUNTER — Encounter: Payer: Self-pay | Admitting: *Deleted

## 2017-02-23 ENCOUNTER — Observation Stay
Admission: EM | Admit: 2017-02-23 | Discharge: 2017-02-23 | Disposition: A | Discharge status: Home / Self Care | Payer: Medicaid Other | Attending: Obstetrics and Gynecology | Admitting: Obstetrics and Gynecology

## 2017-02-23 DIAGNOSIS — Y92009 Unspecified place in unspecified non-institutional (private) residence as the place of occurrence of the external cause: Secondary | ICD-10-CM | POA: Insufficient documentation

## 2017-02-23 DIAGNOSIS — W1830XA Fall on same level, unspecified, initial encounter: Secondary | ICD-10-CM | POA: Diagnosis not present

## 2017-02-23 DIAGNOSIS — M545 Low back pain: Secondary | ICD-10-CM

## 2017-02-23 DIAGNOSIS — O26893 Other specified pregnancy related conditions, third trimester: Secondary | ICD-10-CM | POA: Diagnosis not present

## 2017-02-23 DIAGNOSIS — M549 Dorsalgia, unspecified: Secondary | ICD-10-CM | POA: Diagnosis not present

## 2017-02-23 DIAGNOSIS — R109 Unspecified abdominal pain: Secondary | ICD-10-CM | POA: Diagnosis not present

## 2017-02-23 DIAGNOSIS — Z3A36 36 weeks gestation of pregnancy: Secondary | ICD-10-CM | POA: Diagnosis not present

## 2017-02-23 DIAGNOSIS — Z3490 Encounter for supervision of normal pregnancy, unspecified, unspecified trimester: Secondary | ICD-10-CM

## 2017-02-23 LAB — GC/CHLAMYDIA PROBE AMP
Chlamydia trachomatis, NAA: NEGATIVE
Neisseria gonorrhoeae by PCR: NEGATIVE

## 2017-02-23 MED ORDER — ACETAMINOPHEN 500 MG PO TABS
1000.0000 mg | ORAL_TABLET | Freq: Once | ORAL | Status: AC
  Administered 2017-02-23: 1000 mg via ORAL
  Filled 2017-02-23: qty 2

## 2017-02-23 NOTE — OB Triage Note (Addendum)
Patient admitted to OBS 3 after falling at home this morning at [facility].  She states that the side of the tub hit her upper left side of her abdomen.  Afterwards she started feeling tightness across her abdomen.  She is also complaining of vaginal pain with fetal movement.  FHR 130-135 baseline with moderate variability and positive accelerations. Tylenol given for complain of vaginal and back pain.  [place] was requested by patient and father of baby.  MD aware.

## 2017-02-23 NOTE — Discharge Instructions (Signed)
Keep next follow-up appointment.  If you have any questions or concerns you may call the on call provider. You may also call the nurse's desk at [phone].  If you have urgent concerns please go to the nearest emergency department for further assessment.

## 2017-02-23 NOTE — Progress Notes (Signed)
ROB: Patient complains of mild intermittent back pain and increase pelvic pressure. Also noting [employer].  36 week labs done today. Discussed labor precautions. Advised on labor precautions. Plans to go for Holter monitor tomorrow.  Had EKG done last week for irregular heart beat. Has appt in 2 weeks with Cardiology.   Patient with elevated BP today with repeat 147/91.  Will get [place] labs.

## 2017-02-25 ENCOUNTER — Telehealth: Payer: Self-pay | Admitting: Obstetrics and Gynecology

## 2017-02-25 LAB — CULTURE, BETA STREP (GROUP B ONLY): STREP GP B CULTURE: NEGATIVE

## 2017-02-25 NOTE — Telephone Encounter (Signed)
Please call patient concerning the cardiology appointment that she needs - she is not scheduled until 03/14/2017 - she will be delivered by then

## 2017-02-25 NOTE — Telephone Encounter (Signed)
Jane Jackson, can you please call this office and inform them of pt being OB and see if they can see her any sooner. Thanks

## 2017-02-25 NOTE — Final Progress Note (Signed)
L&D OB Triage Note  Chandel Zaun is a 30 y.o. G2P0010 female at [redacted]w[redacted]d, EDD Estimated Date of Delivery: 03/22/17 who presented to triage for complaints of abdominal pain and back pain s/p ground level fall.  She was evaluated by the nurses with no significant findings for preterm labor or signs of abruption. Vital signs stable. An NST was performed and has been reviewed by MD. She was treated with Tylenol ES.   NST INTERPRETATION: Indications: patient reassurance  Mode: External Baseline Rate (A): 130 bpm (fhr at this time) Variability: Moderate Accelerations: 15 x 15       Contraction Frequency (min): occasional with irritability  Impression: reactive   Plan: NST performed was reviewed and was found to be reactive. She was discharged home with bleeding/labor precautions.  Continue routine prenatal care. Follow up with OB/GYN as previously scheduled.     Rubie Maid, MD  [facility]

## 2017-02-26 ENCOUNTER — Ambulatory Visit (INDEPENDENT_AMBULATORY_CARE_PROVIDER_SITE_OTHER): Payer: Medicaid Other | Admitting: Obstetrics and Gynecology

## 2017-02-26 ENCOUNTER — Encounter: Payer: Self-pay | Admitting: Obstetrics and Gynecology

## 2017-02-26 VITALS — BP 143/84 | HR 112 | Wt 292.1 lb

## 2017-02-26 DIAGNOSIS — Z3493 Encounter for supervision of normal pregnancy, unspecified, third trimester: Secondary | ICD-10-CM

## 2017-02-26 DIAGNOSIS — Z6841 Body Mass Index (BMI) 40.0 and over, adult: Secondary | ICD-10-CM

## 2017-02-26 DIAGNOSIS — Z3481 Encounter for supervision of other normal pregnancy, first trimester: Secondary | ICD-10-CM | POA: Diagnosis not present

## 2017-02-26 LAB — POCT URINALYSIS DIPSTICK
Bilirubin, UA: NEGATIVE
Ketones, UA: NEGATIVE
Leukocytes, UA: NEGATIVE
Nitrite, UA: NEGATIVE
Protein, UA: NEGATIVE
Spec Grav, UA: 1.02
Urobilinogen, UA: 0.2 U/dL
pH, UA: 5

## 2017-02-26 NOTE — Telephone Encounter (Signed)
Pt informed at visit of information below.

## 2017-02-26 NOTE — Telephone Encounter (Signed)
Spoke with cardiology, that is the first available right now, she stated that as soon as she gets a cancellation she will get her in sooner and call the patient.

## 2017-02-26 NOTE — Progress Notes (Signed)
ROB:  Has had fewer irregular heartbeat spells.  Waiting for f/u with cardiology.  Baby "not moving as much" as before I fell.  We discussed kick counts.  Pt to pay more strict attention to movement and inform us if less than 10 AM and 10 PM.  Small blood in urine - everything else negative. - Sent for Cx.

## 2017-02-28 ENCOUNTER — Encounter: Payer: Self-pay | Admitting: Cardiovascular Disease

## 2017-02-28 ENCOUNTER — Other Ambulatory Visit: Payer: Self-pay

## 2017-02-28 ENCOUNTER — Ambulatory Visit (INDEPENDENT_AMBULATORY_CARE_PROVIDER_SITE_OTHER): Payer: Medicaid Other | Admitting: Cardiovascular Disease

## 2017-02-28 ENCOUNTER — Ambulatory Visit (INDEPENDENT_AMBULATORY_CARE_PROVIDER_SITE_OTHER): Payer: Medicaid Other

## 2017-02-28 VITALS — BP 130/88 | HR 116 | Ht 64.0 in | Wt 293.8 lb

## 2017-02-28 DIAGNOSIS — R0602 Shortness of breath: Secondary | ICD-10-CM | POA: Diagnosis not present

## 2017-02-28 DIAGNOSIS — R000 Tachycardia, unspecified: Secondary | ICD-10-CM

## 2017-02-28 LAB — ECHOCARDIOGRAM COMPLETE
AO mean calculated velocity dopler: 84.8 cm/s
AOVTI: 20.5 cm
AV Area VTI index: 1.21 cm2/m2
AV Area mean vel: 2.74 cm2
AV VEL mean LVOT/AV: 0.72
AV vel: 3.08
AVAREAMEANVIN: 1.08 cm2/m2
AVG: 3 mmHg
Area-P 1/2: 3.86 cm2
CHL CUP DOP CALC LVOT VTI: 16.6 cm
CHL CUP MV DEC (S): 194
E decel time: 194 msec
EERAT: 12.09
FS: 49 % — AB (ref 28–44)
HEIGHTINCHES: 64 in
IV/PV OW: 0.92
LA diam index: 1.03 cm/m2
LA vol index: 15.8 mL/m2
LA vol: 40.1 mL
LASIZE: 26 mm
LAVOLA4C: 42.6 mL
LEFT ATRIUM END SYS DIAM: 26 mm
LV E/e'average: 12.09
LV PW d: 12 mm — AB (ref 0.6–1.1)
LV TDI E'LATERAL: 6.42
LV TDI E'MEDIAL: 5.87
LV e' LATERAL: 6.42 cm/s
LVEEMED: 12.09
LVOT SV: 63 mL
LVOT area: 3.8 cm2
LVOT diameter: 22 mm
LVOT peak VTI: 0.81 cm
Lateral S' vel: 15 cm/s
MV pk A vel: 67.9 m/s
MV pk E vel: 77.6 m/s
MVPG: 2 mmHg
MVSPHT: 57 ms
TAPSE: 23.8 mm
Valve area index: 1.21
Valve area: 3.08 cm2
WEIGHTICAEL: 4700 [oz_av]

## 2017-02-28 LAB — URINE CULTURE

## 2017-02-28 NOTE — Patient Instructions (Addendum)
Medication Instructions:  Your physician recommends that you continue on your current medications as directed. Please refer to the Current Medication list given to you today.   Labwork: none  Testing/Procedures: Your physician has requested that you have an echocardiogram. Echocardiography is a painless test that uses sound waves to create images of your heart. It provides your doctor with information about the size and shape of your heart and how well your heart's chambers and valves are working. This procedure takes approximately one hour. There are no restrictions for this procedure.    Follow-Up: Your physician recommends that you schedule a follow-up appointment in: 2 months with Dr. Fletcher Anon.    Any Other Special Instructions Will Be Listed Below (If Applicable).     If you need a refill on your cardiac medications before your next appointment, please call your pharmacy.  Echocardiogram An echocardiogram, or echocardiography, uses sound waves (ultrasound) to produce an image of your heart. The echocardiogram is simple, painless, obtained within a short period of time, and offers valuable information to your health care provider. The images from an echocardiogram can provide information such as:  Evidence of coronary artery disease (CAD).  Heart size.  Heart muscle function.  Heart valve function.  Aneurysm detection.  Evidence of a past heart attack.  Fluid buildup around the heart.  Heart muscle thickening.  Assess heart valve function.  Tell a health care provider about:  Any allergies you have.  All medicines you are taking, including vitamins, herbs, eye drops, creams, and over-the-counter medicines.  Any problems you or family members have had with anesthetic medicines.  Any blood disorders you have.  Any surgeries you have had.  Any medical conditions you have.  Whether you are pregnant or may be pregnant. What happens before the procedure? No  special preparation is needed. Eat and drink normally. What happens during the procedure?  In order to produce an image of your heart, gel will be applied to your chest and a wand-like tool (transducer) will be moved over your chest. The gel will help transmit the sound waves from the transducer. The sound waves will harmlessly bounce off your heart to allow the heart images to be captured in real-time motion. These images will then be recorded.  You may need an IV to receive a medicine that improves the quality of the pictures. What happens after the procedure? You may return to your normal schedule including diet, activities, and medicines, unless your health care provider tells you otherwise. This information is not intended to replace advice given to you by your health care provider. Make sure you discuss any questions you have with your health care provider. Document Released: 09/07/2000 Document Revised: 04/28/2016 Document Reviewed: 05/18/2013 Elsevier Interactive Patient Education  2017 [employer] inappropriate sinus tachycardia

## 2017-02-28 NOTE — Progress Notes (Signed)
Cardiology Office Note   Date:  02/28/2017   ID:  Jane Jackson, DOB 08-Jun-1986, MRN [id number]  PCP:  Coral Spikes, DO  Cardiologist:   Kathlyn Sacramento, MD   Chief Complaint  Patient presents with  . other    New patient. Patient C/o Rapid, irregular heartbeat and SOB. Meds reviewed verbally with patient.       History of Present Illness: Jane Jackson is a 31 y.o. female who presents for evaluation of tachycardia. She is 8 months pregnant. She reports being told in the past about possible mitral valve prolapse but no recent cardiac evaluation. She has known history of asthma and previous tobacco use. She normally has high resting heart rate. In the last few months of pregnancy, she has experienced severe palpitations and tachycardia with heart rate jumping to 140/150 bpm and suddenly decreasing to 50 bpm. This has been associated with dizziness and episodes of syncope. She has significant shortness of breath and reports that the pregnancy have been very difficult on her overall. 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 No chest pain. She has family history of atrial fibrillation and congestive heart failure. Tachycardia worsens when she uses albuterol.    Past Medical History:  Diagnosis Date  . Anxiety   . Asthma   . Chicken pox   . Complication of anesthesia    issue with asthma and intubation  . Cystic dysplasia of one kidney    about 4 months ago-no f/u- largest 63m?  . Diverticulitis   . Diverticulitis   . Fibroid, uterine    dx a few months ago  . GERD (gastroesophageal reflux disease)   . H/O mitral valve prolapse    as a child  . Impaction, bowel ([place]   . Kidney stones     Past Surgical History:  Procedure Laterality Date  . APPENDECTOMY    . CHOLECYSTECTOMY N/A 01/18/2016   Procedure: LAPAROSCOPIC CHOLECYSTECTOMY WITH INTRAOPERATIVE CHOLANGIOGRAM;  Surgeon: BExcell Seltzer MD;  Location: [facility];  Service: General;  Laterality: N/A;  . ruptured  cyst    . TONSILLECTOMY AND ADENOIDECTOMY     age 31 . TYMPANOSTOMY TUBE PLACEMENT       Current Outpatient Prescriptions  Medication Sig Dispense Refill  . ACCU-CHEK FASTCLIX LANCETS MISC Use four times daily as directed 102 each 12  . acetaminophen (TYLENOL) 325 MG tablet Take 2 tablets (650 mg total) by mouth every 6 (six) hours as needed for mild pain (or Fever >/= 101).    .Marland Kitchenalbuterol (ACCUNEB) 1.25 MG/3ML nebulizer solution Take 3 mLs by nebulization 2 (two) times daily as needed. Asthma. 75 mL 3  . albuterol (PROAIR HFA) 108 (90 Base) MCG/ACT inhaler INHALE 2 PUFFS INTO THE LUNGS EVERY 6 (SIX) HOURS AS NEEDED FOR WHEEZING OR SHORTNESS OF BREATH. 1 Inhaler 3  . Blood Glucose Monitoring Suppl (ACCU-CHEK AVIVA CONNECT) w/Device KIT 1 kit by Does not apply route 4 (four) times daily. Check glucose fasting, and 2 hours after each meal. 1 kit 0  . glucose blood test strip Use as instructed 100 each 12  . ondansetron (ZOFRAN ODT) 8 MG disintegrating tablet Take 1 tablet (8 mg total) by mouth every 8 (eight) hours as needed for nausea, vomiting or refractory nausea / vomiting. 30 tablet 3  . ondansetron (ZOFRAN) 4 MG tablet TAKE 1 TABLET (4 MG TOTAL) BY MOUTH EVERY 8 (EIGHT) HOURS AS NEEDED FOR NAUSEA OR VOMITING. 20 tablet 2  .  Prenat w/o A FeCbnFeGlu-FA &B6 (CITRANATAL B-CALM) 20-1 & 25 (2) MG MISC Take 1 tablet by mouth daily. 30 each 2  . Prenatal Vit-Fe Fumarate-FA (MULTIVITAMIN-PRENATAL) 27-0.8 MG TABS tablet Take 1 tablet by mouth daily at 12 noon.    . promethazine (PHENERGAN) 25 MG tablet TAKE 1 TABLET (25 MG TOTAL) BY MOUTH EVERY 6 (SIX) HOURS AS NEEDED FOR NAUSEA OR VOMITING. 30 tablet 3  . promethazine (PHENERGAN) 50 MG tablet Take 1 tablet (50 mg total) by mouth every 8 (eight) hours as needed for nausea or vomiting. 60 tablet 3   No current facility-administered medications for this visit.     Allergies:   Latex    Social History:  The patient  reports that she has quit  smoking. She has never used smokeless tobacco. She reports that she does not drink alcohol or use drugs.   Family History:  The patient's family history includes Alcohol abuse in her father; Arthritis in her father, maternal grandfather, maternal grandmother, mother, paternal grandfather, and paternal grandmother; Cervical cancer in her mother; Colon cancer in her maternal grandfather; Diabetes in her maternal grandmother, mother, and paternal grandmother; Heart disease in her maternal grandfather, maternal grandmother, paternal grandfather, and paternal grandmother; Hypertension in her father, maternal grandfather, maternal grandmother, mother, paternal grandfather, and paternal grandmother; Prostate cancer in her maternal grandfather; Stroke in her maternal grandfather and maternal grandmother; Thyroid disease in her maternal aunt.    ROS:  Please see the history of present illness.   Otherwise, review of systems are positive for none.   All other systems are reviewed and negative.    PHYSICAL EXAM: VS:  BP 130/88 (BP Location: Right Arm, Patient Position: Sitting, Cuff Size: Normal)   Pulse (!) 116   Ht _0  (1.626 m)   Wt 293 lb 12 oz (133.2 kg)   LMP 06/13/2016   BMI 50.42 kg/m  , BMI Body mass index is 50.42 kg/m. GEN: Well nourished, well developed, in no acute distress  HEENT: normal  Neck: no JVD, carotid bruits, or masses Cardiac: RRR; no murmurs, rubs, or gallops,no edema  Respiratory:  clear to auscultation bilaterally, normal work of breathing GI: soft, nontender, nondistended, + BS MS: no deformity or atrophy  Skin: warm and dry, no rash Neuro:  Strength and sensation are intact Psych: euthymic mood, full affect   EKG:  EKG is ordered today. The ekg ordered today demonstrates sinus tachycardia with no significant ST or T wave changes.   Recent Labs: 08/08/2016: TSH 1.840 01/23/2017: Hemoglobin 12.8; Platelets 304 02/20/2017: ALT 7; BUN 6; Creatinine, Ser 0.65;  Potassium 4.1; Sodium 136    Lipid Panel No results found for: CHOL, TRIG, HDL, CHOLHDL, VLDL, LDLCALC, LDLDIRECT    Wt Readings from Last 3 Encounters:  02/28/17 293 lb 12 oz (133.2 kg)  02/26/17 292 lb 1 oz (132.5 kg)  02/20/17 291 lb 12.8 oz (132.4 kg)       No flowsheet data found.    ASSESSMENT AND PLAN:  1.  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 Orthostatic vitals could not be done today because she was very uncomfortable. Both these conditions worsen in pregnancy and I advised her to maintain optimal oral hydration and avoid sudden change in position. Treatment with metoprolol should be left as a last resort. I suspect that her symptoms will improve after delivery. Given significant shortness of breath, I do think we have to exclude cardiomyopathy or  valvular abnormalities. I requested an echocardiogram.  She had a Holter monitor done about the tracings are still not available.    Disposition:   FU with me in 2 months  Signed,  Kathlyn Sacramento, MD  02/28/2017 11:01 AM    [place]

## 2017-03-05 ENCOUNTER — Ambulatory Visit (INDEPENDENT_AMBULATORY_CARE_PROVIDER_SITE_OTHER): Payer: Medicaid Other | Admitting: Obstetrics and Gynecology

## 2017-03-05 VITALS — BP 142/90 | HR 109 | Wt 296.4 lb

## 2017-03-05 DIAGNOSIS — O26843 Uterine size-date discrepancy, third trimester: Secondary | ICD-10-CM

## 2017-03-05 DIAGNOSIS — O133 Gestational [pregnancy-induced] hypertension without significant proteinuria, third trimester: Secondary | ICD-10-CM

## 2017-03-05 DIAGNOSIS — Z3483 Encounter for supervision of other normal pregnancy, third trimester: Secondary | ICD-10-CM

## 2017-03-05 LAB — POCT URINALYSIS DIPSTICK
Bilirubin, UA: NEGATIVE
Glucose, UA: NEGATIVE
KETONES UA: NEGATIVE
Leukocytes, UA: NEGATIVE
Nitrite, UA: NEGATIVE
SPEC GRAV UA: 1.02 (ref 1.010–1.025)
Urobilinogen, UA: 0.2 E.U./dL
pH, UA: 6 (ref 5.0–8.0)

## 2017-03-05 NOTE — Progress Notes (Signed)
ROB: Patient complains of more pelvic pressure.  Has seen Cardiologist for irregular heartbeat.  Has had normal echocardiogram and Holter monitoring.  Will f/u with them after birth.  BPs mildly elevated, with GHTN. Will begin NSTs.  Size>dates, will order growth scan. Can wait for cervix to become more favorable before considering IOL.  RTC in 1 week.

## 2017-03-07 ENCOUNTER — Ambulatory Visit: Payer: Medicaid Other | Admitting: Nurse Practitioner

## 2017-03-08 ENCOUNTER — Encounter: Payer: Self-pay | Admitting: Obstetrics and Gynecology

## 2017-03-12 ENCOUNTER — Encounter: Payer: Medicaid Other | Admitting: Obstetrics and Gynecology

## 2017-03-12 ENCOUNTER — Inpatient Hospital Stay: Payer: Medicaid Other | Admitting: Certified Registered Nurse Anesthetist

## 2017-03-12 ENCOUNTER — Inpatient Hospital Stay
Admission: EM | Admit: 2017-03-12 | Discharge: 2017-03-15 | DRG: 774 | Disposition: A | Discharge status: Home / Self Care | Payer: Medicaid Other | Attending: Obstetrics and Gynecology | Admitting: Obstetrics and Gynecology

## 2017-03-12 ENCOUNTER — Encounter: Payer: Self-pay | Admitting: Obstetrics and Gynecology

## 2017-03-12 DIAGNOSIS — Z87891 Personal history of nicotine dependence: Secondary | ICD-10-CM | POA: Diagnosis not present

## 2017-03-12 DIAGNOSIS — O4292 Full-term premature rupture of membranes, unspecified as to length of time between rupture and onset of labor: Principal | ICD-10-CM | POA: Diagnosis present

## 2017-03-12 DIAGNOSIS — O99214 Obesity complicating childbirth: Secondary | ICD-10-CM | POA: Diagnosis present

## 2017-03-12 DIAGNOSIS — IMO0001 Reserved for inherently not codable concepts without codable children: Secondary | ICD-10-CM

## 2017-03-12 DIAGNOSIS — O9952 Diseases of the respiratory system complicating childbirth: Secondary | ICD-10-CM | POA: Diagnosis present

## 2017-03-12 DIAGNOSIS — Z6841 Body Mass Index (BMI) 40.0 and over, adult: Secondary | ICD-10-CM | POA: Diagnosis not present

## 2017-03-12 DIAGNOSIS — O24420 Gestational diabetes mellitus in childbirth, diet controlled: Secondary | ICD-10-CM | POA: Diagnosis present

## 2017-03-12 DIAGNOSIS — Z3A38 38 weeks gestation of pregnancy: Secondary | ICD-10-CM

## 2017-03-12 DIAGNOSIS — J4540 Moderate persistent asthma, uncomplicated: Secondary | ICD-10-CM | POA: Diagnosis present

## 2017-03-12 DIAGNOSIS — Z91040 Latex allergy status: Secondary | ICD-10-CM

## 2017-03-12 LAB — CBC
HCT: 37 % (ref 35.0–47.0)
Hemoglobin: 12.6 g/dL (ref 12.0–16.0)
MCH: 30 pg (ref 26.0–34.0)
MCHC: 34 g/dL (ref 32.0–36.0)
MCV: 88.3 fL (ref 80.0–100.0)
PLATELETS: 290 10*3/uL (ref 150–440)
RBC: 4.19 MIL/uL (ref 3.80–5.20)
RDW: 13.8 % (ref 11.5–14.5)
WBC: 14 10*3/uL — AB (ref 3.6–11.0)

## 2017-03-12 LAB — TYPE AND SCREEN
ABO/RH(D): A POS
Antibody Screen: NEGATIVE

## 2017-03-12 MED ORDER — LACTATED RINGERS IV SOLN
INTRAVENOUS | Status: DC
  Administered 2017-03-12 (×2): via INTRAVENOUS

## 2017-03-12 MED ORDER — LIDOCAINE-EPINEPHRINE (PF) 1.5 %-1:200000 IJ SOLN
INTRAMUSCULAR | Status: DC | PRN
  Administered 2017-03-12: 3 mL via PERINEURAL

## 2017-03-12 MED ORDER — LIDOCAINE HCL (PF) 1 % IJ SOLN
INTRAMUSCULAR | Status: AC
  Filled 2017-03-12: qty 30

## 2017-03-12 MED ORDER — FENTANYL 2.5 MCG/ML W/ROPIVACAINE 0.15% IN NS 100 ML EPIDURAL (ARMC)
EPIDURAL | Status: AC
  Administered 2017-03-12: 21:00:00
  Filled 2017-03-12: qty 100

## 2017-03-12 MED ORDER — OXYTOCIN 40 UNITS IN LACTATED RINGERS INFUSION - SIMPLE MED: 1.0000 m[IU]/min | INTRAVENOUS | Status: DC

## 2017-03-12 MED ORDER — LACTATED RINGERS IV SOLN
500.0000 mL | INTRAVENOUS | Status: DC | PRN
  Administered 2017-03-12 (×2): 500 mL via INTRAVENOUS

## 2017-03-12 MED ORDER — OXYTOCIN 10 UNIT/ML IJ SOLN
INTRAMUSCULAR | Status: AC
  Filled 2017-03-12: qty 2

## 2017-03-12 MED ORDER — AMMONIA AROMATIC IN INHA
RESPIRATORY_TRACT | Status: AC
  Filled 2017-03-12: qty 10

## 2017-03-12 MED ORDER — ACETAMINOPHEN 325 MG PO TABS
650.0000 mg | ORAL_TABLET | ORAL | Status: DC | PRN
  Administered 2017-03-12 – 2017-03-13 (×3): 650 mg via ORAL
  Filled 2017-03-12 (×3): qty 2

## 2017-03-12 MED ORDER — FENTANYL 2.5 MCG/ML W/ROPIVACAINE 0.15% IN NS 100 ML EPIDURAL (ARMC)
EPIDURAL | Status: DC | PRN
  Administered 2017-03-12: 12 mL/h via EPIDURAL

## 2017-03-12 MED ORDER — LIDOCAINE HCL (PF) 1 % IJ SOLN: 30.0000 mL | INTRAMUSCULAR | Status: DC | PRN

## 2017-03-12 MED ORDER — OXYTOCIN 40 UNITS IN LACTATED RINGERS INFUSION - SIMPLE MED
2.5000 [IU]/h | INTRAVENOUS | Status: DC
  Administered 2017-03-13 (×2): 2.5 [IU]/h via INTRAVENOUS
  Filled 2017-03-12 (×2): qty 1000

## 2017-03-12 MED ORDER — EPHEDRINE SULFATE 50 MG/ML IJ SOLN
INTRAMUSCULAR | Status: DC | PRN
  Administered 2017-03-12: 10 mg via INTRAVENOUS

## 2017-03-12 MED ORDER — MISOPROSTOL 200 MCG PO TABS
ORAL_TABLET | ORAL | Status: AC
  Filled 2017-03-12: qty 4

## 2017-03-12 MED ORDER — EPHEDRINE SULFATE 50 MG/ML IJ SOLN
INTRAMUSCULAR | Status: AC
  Filled 2017-03-12: qty 1

## 2017-03-12 MED ORDER — LIDOCAINE HCL (PF) 1 % IJ SOLN
INTRAMUSCULAR | Status: DC | PRN
  Administered 2017-03-12: 3 mL

## 2017-03-12 MED ORDER — SOD CITRATE-CITRIC ACID 500-334 MG/5ML PO SOLN
30.0000 mL | ORAL | Status: DC | PRN
  Filled 2017-03-12: qty 30

## 2017-03-12 MED ORDER — FENTANYL 2.5 MCG/ML W/ROPIVACAINE 0.2% IN NS 100 ML EPIDURAL INFUSION (ARMC-ANES)
EPIDURAL | Status: AC
  Filled 2017-03-12: qty 100

## 2017-03-12 MED ORDER — OXYTOCIN BOLUS FROM INFUSION
500.0000 mL | Freq: Once | INTRAVENOUS | Status: AC
  Administered 2017-03-13: 500 mL via INTRAVENOUS

## 2017-03-12 MED ORDER — ONDANSETRON HCL 4 MG/2ML IJ SOLN
4.0000 mg | Freq: Four times a day (QID) | INTRAMUSCULAR | Status: DC | PRN
  Administered 2017-03-12 – 2017-03-13 (×2): 4 mg via INTRAVENOUS
  Filled 2017-03-12 (×3): qty 2

## 2017-03-12 MED ORDER — OXYTOCIN 40 UNITS IN LACTATED RINGERS INFUSION - SIMPLE MED
1.0000 m[IU]/min | INTRAVENOUS | Status: DC
  Administered 2017-03-12: 2 m[IU]/min via INTRAVENOUS
  Administered 2017-03-12: 4 m[IU]/min via INTRAVENOUS

## 2017-03-12 NOTE — Anesthesia Procedure Notes (Signed)
Epidural Patient location during procedure: OB Start time: 03/12/2017 12:30 PM  Staffing Anesthesiologist: Gunnar Fusi Resident/CRNA: Demetrius Charity Performed: resident/CRNA   Preanesthetic Checklist Completed: patient identified, site marked, surgical consent, pre-op evaluation, timeout performed, IV checked, risks and benefits discussed and monitors and equipment checked  Epidural Patient position: sitting Prep: Betadine Patient monitoring: heart rate, continuous pulse ox and blood pressure Approach: midline Location: L4-L5 Injection technique: LOR saline  Needle:  Needle type: Tuohy  Needle gauge: 17 G Needle length: 9 cm and 9 Needle insertion depth: 8 cm Catheter type: closed end flexible Catheter size: 19 Gauge Catheter at skin depth: 14 cm Test dose: negative and 1.5% lidocaine with Epi 1:200 K  Assessment Sensory level: T10 Events: blood not aspirated, injection not painful, no injection resistance, negative IV test and no paresthesia  Additional Notes Pt. Evaluated and documentation done after procedure finished. Patient identified. Risks/Benefits/Options discussed with patient including but not limited to bleeding, infection, nerve damage, paralysis, failed block, incomplete pain control, headache, blood pressure changes, nausea, vomiting, reactions to medication both or allergic, itching and postpartum back pain. Confirmed with bedside nurse the patient's most recent platelet count. Confirmed with patient that they are not currently taking any anticoagulation, have any bleeding history or any family history of bleeding disorders. Patient expressed understanding and wished to proceed. All questions were answered. Sterile technique was used throughout the entire procedure. Please see nursing notes for vital signs. Test dose was given through epidural catheter and negative prior to continuing to dose epidural or start infusion. Warning signs of high block given to  the patient including shortness of breath, tingling/numbness in hands, complete motor block, or any concerning symptoms with instructions to call for help. Patient was given instructions on fall risk and not to get out of bed. All questions and concerns addressed with instructions to call with any issues or inadequate analgesia.   Patient tolerated the insertion well without immediate complications.Reason for block:procedure for pain

## 2017-03-12 NOTE — H&P (Signed)
History and Physical   HPI  Jane Jackson is a 31 y.o. G2P0010 at 24w4dEstimated Date of Delivery: 03/22/17 who is being admitted for  PROM with irregular contractions  OB History  Obstetric History   G2   P0   T0   P0   A1   L0    SAB1   TAB0   Ectopic0   Multiple0   Live Births0     # Outcome Date GA Lbr Len/2nd Weight Sex Delivery Anes PTL Lv  2 Current           1 SAB               PROBLEM LIST  Pregnancy complications or risks: Patient Active Problem List   Diagnosis Date Noted  . Labor and delivery, indication for care 03/12/2017  . Pregnancy 02/23/2017  . Poor weight gain of pregnancy, second trimester 01/11/2017  . Abnormal first trimester screen   . Family history of cystic fibrosis   . Family history of mental retardation   . Moderate persistent asthma without complication 151/10/5850 . Nausea and vomiting during pregnancy 09/16/2016  . History of mitral valve prolapse 09/16/2016  . Morbid obesity with BMI of 40.0-44.9, adult ([place] 01/14/2016    Prenatal labs and studies: ABO, Rh: --/--/A POS (06/19 07782 Antibody: NEG (06/19 04235 Rubella: 5.58 (11/15 1652) RPR: Non Reactive (11/15 1652)  HBsAg: Negative (11/15 1652)  HIV: Non Reactive (11/15 1652)     Past Medical History:  Diagnosis Date  . Anxiety   . Asthma   . Chicken pox   . Complication of anesthesia    issue with asthma and intubation  . Cystic dysplasia of one kidney    about 4 months ago-no f/u- largest 59m  . Diverticulitis   . Diverticulitis   . Fibroid, uterine    dx a few months ago  . GERD (gastroesophageal reflux disease)   . H/O mitral valve prolapse    as a child  . Impaction, bowel ([place]  . Kidney stones      Past Surgical History:  Procedure Laterality Date  . APPENDECTOMY    . CHOLECYSTECTOMY N/A 01/18/2016   Procedure: LAPAROSCOPIC CHOLECYSTECTOMY WITH INTRAOPERATIVE CHOLANGIOGRAM;  Surgeon: BeExcell SeltzerMD;  Location: [facility];  Service: General;   Laterality: N/A;  . ruptured cyst    . TONSILLECTOMY AND ADENOIDECTOMY     age [age]. TYMPANOSTOMY TUBE PLACEMENT       Medications    Current Discharge Medication List    CONTINUE these medications which have NOT CHANGED   Details  ACCU-CHEK FASTCLIX LANCETS MISC Use four times daily as directed Qty: 102 each, Refills: 12   Associated Diagnoses: Diabetes mellitus screening    acetaminophen (TYLENOL) 325 MG tablet Take 2 tablets (650 mg total) by mouth every 6 (six) hours as needed for mild pain (or Fever >/= 101).    albuterol (ACCUNEB) 1.25 MG/3ML nebulizer solution Take 3 mLs by nebulization 2 (two) times daily as needed. Asthma. Qty: 75 mL, Refills: 3   Associated Diagnoses: Encounter for supervision of other normal pregnancy in third trimester; Asthma affecting pregnancy, antepartum    albuterol (PROAIR HFA) 108 (90 Base) MCG/ACT inhaler INHALE 2 PUFFS INTO THE LUNGS EVERY 6 (SIX) HOURS AS NEEDED FOR WHEEZING OR SHORTNESS OF BREATH. Qty: 1 Inhaler, Refills: 3   Associated Diagnoses: Encounter for supervision of other normal pregnancy in third trimester; Asthma affecting pregnancy, antepartum  Blood Glucose Monitoring Suppl (ACCU-CHEK AVIVA CONNECT) w/Device KIT 1 kit by Does not apply route 4 (four) times daily. Check glucose fasting, and 2 hours after each meal. Qty: 1 kit, Refills: 0    glucose blood test strip Use as instructed Qty: 100 each, Refills: 12    ondansetron (ZOFRAN ODT) 8 MG disintegrating tablet Take 1 tablet (8 mg total) by mouth every 8 (eight) hours as needed for nausea, vomiting or refractory nausea / vomiting. Qty: 30 tablet, Refills: 3    ondansetron (ZOFRAN) 4 MG tablet TAKE 1 TABLET (4 MG TOTAL) BY MOUTH EVERY 8 (EIGHT) HOURS AS NEEDED FOR NAUSEA OR VOMITING. Qty: 20 tablet, Refills: 2   Associated Diagnoses: Nausea and vomiting during pregnancy    Prenat w/o A FeCbnFeGlu-FA &B6 (CITRANATAL B-CALM) 20-1 & 25 (2) MG MISC Take 1 tablet by mouth  daily. Qty: 30 each, Refills: 2    !! promethazine (PHENERGAN) 25 MG tablet TAKE 1 TABLET (25 MG TOTAL) BY MOUTH EVERY 6 (SIX) HOURS AS NEEDED FOR NAUSEA OR VOMITING. Qty: 30 tablet, Refills: 3   Associated Diagnoses: Nausea and vomiting during pregnancy    !! promethazine (PHENERGAN) 50 MG tablet Take 1 tablet (50 mg total) by mouth every 8 (eight) hours as needed for nausea or vomiting. Qty: 60 tablet, Refills: 3   Associated Diagnoses: Encounter for supervision of other normal pregnancy in third trimester; Nausea and vomiting during pregnancy    Prenatal Vit-Fe Fumarate-FA (MULTIVITAMIN-PRENATAL) 27-0.8 MG TABS tablet Take 1 tablet by mouth daily at 12 noon.   Associated Diagnoses: Antenatal screening encounter     !! - Potential duplicate medications found. Please discuss with provider.       Allergies  Latex  Review of Systems  Pertinent items are noted in HPI.  Physical Exam  BP 125/74   Pulse 77   Temp 98.1 F (36.7 C) (Oral)   Resp 18   Ht '5\' 4"'$  (1.626 m)   Wt 293 lb (132.9 kg)   LMP 06/13/2016   SpO2 99%   BMI 50.29 kg/m   Lungs:  CTA B Cardio: RRR without M/R/G Abd: Soft, gravid, NT Presentation: cephalic EXT: No C/C/ 1+ Edema DTRs: 2+ B CERVIX: 1 cm: byt nursse exam See Prenatal records for more detailed PE.     FHR:  Variability: Good {> 6 bpm)  Toco: Uterine Contractions: 3-29mn   Test Results  Results for orders placed or performed during the hospital encounter of 03/12/17 (from the past 24 hour(s))  CBC     Status: Abnormal   Collection Time: 03/12/17  9:53 AM  Result Value Ref Range   WBC 14.0 (H) 3.6 - 11.0 K/uL   RBC 4.19 3.80 - 5.20 MIL/uL   Hemoglobin 12.6 12.0 - 16.0 g/dL   HCT 37.0 35.0 - 47.0 %   MCV 88.3 80.0 - 100.0 fL   MCH 30.0 26.0 - 34.0 pg   MCHC 34.0 32.0 - 36.0 g/dL   RDW 13.8 11.5 - 14.5 %   Platelets 290 150 - 440 K/uL  [place] and [place]    Status: None   Collection Time:  03/12/17  9:53 AM  Result Value Ref Range   ABO/RH(D) A POS    Antibody Screen NEG    Sample Expiration 03/15/2017      Assessment   G2P0010 at 324w4dstimated Date of Delivery: 03/22/17  The fetus is reassuring.   Patient Active Problem List   Diagnosis  Date Noted  . Labor and delivery, indication for care 03/12/2017  . Pregnancy 02/23/2017  . Poor weight gain of pregnancy, second trimester 01/11/2017  . Abnormal first trimester screen   . Family history of cystic fibrosis   . Family history of mental retardation   . Moderate persistent asthma without complication 17/51/0258  . Nausea and vomiting during pregnancy 09/16/2016  . History of mitral valve prolapse 09/16/2016  . Morbid obesity with BMI of 40.0-44.9, adult ([place]) 01/14/2016    Plan  1. Admit to L&D for expectant management  Pitocin PRN 2. EFM: -- Category 1 3. Epidural if desired. Stadol for IV pain until epidural requested. 4. Admission labs    Finis Bud, M.D. 03/12/2017 2:25 PM

## 2017-03-12 NOTE — Anesthesia Preprocedure Evaluation (Signed)
Anesthesia Evaluation  Patient identified by MRN, date of birth, ID band Patient awake    Reviewed: Allergy & Precautions, H&P , NPO status , Patient's Chart, lab work & pertinent test results  History of Anesthesia Complications (+) history of anesthetic complications  Airway Mallampati: III   Neck ROM: limited  Mouth opening: Limited Mouth Opening  Dental  (+) Teeth Intact   Pulmonary asthma , former smoker,    Pulmonary exam normal        Cardiovascular Exercise Tolerance: Good Normal cardiovascular exam     Neuro/Psych negative neurological ROS  negative psych ROS   GI/Hepatic Neg liver ROS, GERD  ,  Endo/Other  negative endocrine ROS  Renal/GU negative Renal ROS  negative genitourinary   Musculoskeletal   Abdominal   Peds  Hematology negative hematology ROS (+)   Anesthesia Other Findings HX of asthma exacerbation with previous intubation. Hx of kidney stones. Hx of MVP as a child  Reproductive/Obstetrics (+) Pregnancy                             Anesthesia Physical Anesthesia Plan  ASA: III  Anesthesia Plan: Epidural   Post-op Pain Management:    Induction:   PONV Risk Score and Plan: 0 and Ondansetron, Dexamethasone and Treatment may vary due to age  Airway Management Planned:   Additional Equipment:   Intra-op Plan:   Post-operative Plan:   Informed Consent: I have reviewed the patients History and Physical, chart, labs and discussed the procedure including the risks, benefits and alternatives for the proposed anesthesia with the patient or authorized representative who has indicated his/her understanding and acceptance.   Dental Advisory Given  Plan Discussed with: Anesthesiologist  Anesthesia Plan Comments:         Anesthesia Quick Evaluation

## 2017-03-13 ENCOUNTER — Encounter: Payer: Medicaid Other | Admitting: Obstetrics and Gynecology

## 2017-03-13 ENCOUNTER — Other Ambulatory Visit: Payer: Medicaid Other

## 2017-03-13 DIAGNOSIS — Z3A38 38 weeks gestation of pregnancy: Secondary | ICD-10-CM

## 2017-03-13 DIAGNOSIS — O4292 Full-term premature rupture of membranes, unspecified as to length of time between rupture and onset of labor: Secondary | ICD-10-CM

## 2017-03-13 LAB — RPR: RPR Ser Ql: NONREACTIVE

## 2017-03-13 MED ORDER — OXYCODONE-ACETAMINOPHEN 5-325 MG PO TABS
1.0000 | ORAL_TABLET | ORAL | Status: DC | PRN
  Administered 2017-03-13 (×2): 1 via ORAL
  Filled 2017-03-13 (×4): qty 1

## 2017-03-13 MED ORDER — TETANUS-DIPHTH-ACELL PERTUSSIS 5-2.5-18.5 LF-MCG/0.5 IM SUSP: 0.5000 mL | Freq: Once | INTRAMUSCULAR | Status: DC

## 2017-03-13 MED ORDER — FENTANYL 2.5 MCG/ML W/ROPIVACAINE 0.15% IN NS 100 ML EPIDURAL (ARMC)
EPIDURAL | Status: AC
  Administered 2017-03-13: 03:00:00
  Filled 2017-03-13: qty 100

## 2017-03-13 MED ORDER — SODIUM CHLORIDE 0.9 % IV SOLN
2.0000 g | Freq: Once | INTRAVENOUS | Status: AC
  Administered 2017-03-13: 2 g via INTRAVENOUS

## 2017-03-13 MED ORDER — SIMETHICONE 80 MG PO CHEW
80.0000 mg | CHEWABLE_TABLET | ORAL | Status: DC | PRN
Start: 2017-03-13 — End: 2017-03-15

## 2017-03-13 MED ORDER — OXYTOCIN 40 UNITS IN LACTATED RINGERS INFUSION - SIMPLE MED: 2.5000 [IU]/h | INTRAVENOUS | Status: DC | PRN

## 2017-03-13 MED ORDER — COCONUT OIL OIL
1.0000 "application " | TOPICAL_OIL | Status: DC | PRN
  Administered 2017-03-14: 1 via TOPICAL
  Filled 2017-03-13: qty 120

## 2017-03-13 MED ORDER — AMPICILLIN SODIUM 2 G IJ SOLR
INTRAMUSCULAR | Status: AC
  Filled 2017-03-13: qty 2000

## 2017-03-13 MED ORDER — DIBUCAINE 1 % RE OINT
1.0000 "application " | TOPICAL_OINTMENT | RECTAL | Status: DC | PRN
  Administered 2017-03-15: 1 via RECTAL
  Filled 2017-03-13: qty 28

## 2017-03-13 MED ORDER — DIPHENHYDRAMINE HCL 25 MG PO CAPS: 25.0000 mg | ORAL_CAPSULE | Freq: Four times a day (QID) | ORAL | Status: DC | PRN

## 2017-03-13 MED ORDER — DOCUSATE SODIUM 100 MG PO CAPS
100.0000 mg | ORAL_CAPSULE | Freq: Two times a day (BID) | ORAL | Status: DC
  Administered 2017-03-13 – 2017-03-15 (×4): 100 mg via ORAL
  Filled 2017-03-13 (×4): qty 1

## 2017-03-13 MED ORDER — ONDANSETRON HCL 4 MG PO TABS: 4.0000 mg | ORAL_TABLET | ORAL | Status: DC | PRN

## 2017-03-13 MED ORDER — ONDANSETRON HCL 4 MG/2ML IJ SOLN
4.0000 mg | INTRAMUSCULAR | Status: DC | PRN
Start: 2017-03-13 — End: 2017-03-15

## 2017-03-13 MED ORDER — ACETAMINOPHEN 325 MG PO TABS: 650.0000 mg | ORAL_TABLET | ORAL | Status: DC | PRN

## 2017-03-13 MED ORDER — PRENATAL MULTIVITAMIN CH
1.0000 | ORAL_TABLET | Freq: Every day | ORAL | Status: DC
  Administered 2017-03-13 – 2017-03-15 (×3): 1 via ORAL
  Filled 2017-03-13 (×3): qty 1

## 2017-03-13 MED ORDER — IBUPROFEN 600 MG PO TABS
600.0000 mg | ORAL_TABLET | Freq: Four times a day (QID) | ORAL | Status: DC
  Administered 2017-03-13 – 2017-03-15 (×9): 600 mg via ORAL
  Filled 2017-03-13 (×9): qty 1

## 2017-03-13 MED ORDER — ZOLPIDEM TARTRATE 5 MG PO TABS: 5.0000 mg | ORAL_TABLET | Freq: Every evening | ORAL | Status: DC | PRN

## 2017-03-13 MED ORDER — BENZOCAINE-MENTHOL 20-0.5 % EX AERO
1.0000 "application " | INHALATION_SPRAY | CUTANEOUS | Status: DC | PRN
  Administered 2017-03-13 – 2017-03-15 (×2): 1 via TOPICAL
  Filled 2017-03-13 (×3): qty 56

## 2017-03-13 MED ORDER — SODIUM CHLORIDE 0.9 % IV SOLN
INTRAVENOUS | Status: DC | PRN
  Administered 2017-03-12 – 2017-03-13 (×4): 5 mL via EPIDURAL

## 2017-03-13 MED ORDER — WITCH HAZEL-GLYCERIN EX PADS
1.0000 "application " | MEDICATED_PAD | CUTANEOUS | Status: DC | PRN
  Administered 2017-03-15: 1 via TOPICAL
  Filled 2017-03-13: qty 100

## 2017-03-13 MED ORDER — SODIUM CHLORIDE 0.9 % IV SOLN
1.0000 g | INTRAVENOUS | Status: DC
  Administered 2017-03-13 (×2): 1 g via INTRAVENOUS
  Filled 2017-03-13 (×7): qty 1000

## 2017-03-13 MED ORDER — OXYCODONE-ACETAMINOPHEN 5-325 MG PO TABS
2.0000 | ORAL_TABLET | ORAL | Status: DC | PRN
  Administered 2017-03-13 – 2017-03-15 (×10): 2 via ORAL
  Filled 2017-03-13 (×9): qty 2

## 2017-03-14 ENCOUNTER — Ambulatory Visit: Payer: Medicaid Other | Admitting: Nurse Practitioner

## 2017-03-14 NOTE — Anesthesia Postprocedure Evaluation (Signed)
Anesthesia Post Note  Patient: Jane Jackson  Procedure(s) Performed: * No procedures listed *  Patient location during evaluation: Mother Baby Anesthesia Type: Epidural Level of consciousness: awake and alert and oriented Pain management: pain level controlled Vital Signs Assessment: post-procedure vital signs reviewed and stable Respiratory status: respiratory function stable Cardiovascular status: blood pressure returned to baseline Postop Assessment: no backache, no headache, epidural receding, no signs of nausea or vomiting, patient able to bend at knees and adequate PO intake Anesthetic complications: no     Last Vitals:  Vitals:   03/13/17 2322 03/14/17 0308  BP: (!) 103/57 (!) 104/58  Pulse: 81 74  Resp: 18 18  Temp:  36.6 C    Last Pain:  Vitals:   03/14/17 0310  TempSrc:   PainSc: 3                  Blima Singer

## 2017-03-15 MED ORDER — ALBUTEROL SULFATE HFA 108 (90 BASE) MCG/ACT IN AERS
2.0000 | INHALATION_SPRAY | RESPIRATORY_TRACT | Status: DC | PRN
  Administered 2017-03-15: 2 via RESPIRATORY_TRACT
  Filled 2017-03-15: qty 6.7

## 2017-03-15 MED ORDER — OPTICHAMBER ADVANTAGE MISC
1.0000 | Freq: Once | Status: AC
  Administered 2017-03-15: 1
  Filled 2017-03-15: qty 1

## 2017-03-15 NOTE — Progress Notes (Signed)
Pt expressed concern regarding asthma and "i don't have my inhaler with me"; pt requesting "inhaler and the spacer, not the nebulizer"; RN paged Dr. Amalia Hailey and MD ok with albuterol inhaler ordered; pt requesting the "chamber" that comes with it ("i use that at home"); RN entered order for optichamber; per pharmacist, there is a policy that optichambers only are for kids, and pt's in behavioral area and ED; pharmacist and MD spoke and just this once a chamber will be sent to adult pt

## 2017-03-15 NOTE — Progress Notes (Signed)
All discharge instructions given to patient and she voices understanding of all instructions given.  She will make

## 2017-03-15 NOTE — Progress Notes (Signed)
Patient is asthmatic who is very concerned about having an attack. Spoke to both RN and MD about doing a nebulizer solution as well.  They both relayed that patient does not want a nebulizer and will panic if she does not have an inhaler.  I verbalized to RN that this is a one-time special case and that per policy we only dispense HFA's to ED behavioral and behavioral -- RN conveyed understanding.  Thanks  Tobie Lords, PharmD, BCPS Clinical Pharmacist 03/15/2017

## 2017-03-15 NOTE — Progress Notes (Signed)
Patient ID: Jane Jackson, female   DOB: Dec 12, 1985, 31 y.o.   MRN: [id number] **Belated progress note from 03/14/2017  8AM**  Progress Note - Vaginal Delivery  Jane Jackson is a 31 y.o. G2P1011 now PP day 1 s/p Vaginal, Spontaneous Delivery .   Subjective:  The patient reports no complaints - desires D/C - breastfeeding without problem  Objective:  Vital signs in last 24 hours: Temp:  [97.9 F (36.6 C)-98 F (36.7 C)] 98 F (36.7 C) (06/22 0845) Pulse Rate:  [73-80] 73 (06/22 0845) Resp:  [14-20] 14 (06/22 0845) BP: (128-138)/(65-82) 138/82 (06/22 0845) SpO2:  [97 %-100 %] 100 % (06/22 0845)  Physical Exam:  General: alert, cooperative and no distress Lochia: appropriate Uterine Fundus: firm DVT Evaluation: No evidence of DVT seen on physical exam.    Data Review No results for input(s): HGB, HCT in the last 72 hours.  Assessment/Plan: Active Problems:   Labor and delivery, indication for care   Discharge home  -- Continue routine PP care.     Finis Bud, M.D. 03/15/2017 1:16 PM

## 2017-03-15 NOTE — Discharge Summary (Signed)
                               Discharge Summary  Date of Admission: 03/12/2017  Date of Discharge: 03/15/2017  Admitting Diagnosis: Premature rupture of membrane at [redacted]w[redacted]d  Secondary Diagnosis: Gestational diabetes diet controlled (A1) and obesity  Mode of Delivery: Pitocin augmented vaginal delivery       Intrapartum Procedures: epidural, pitocin augmentation, placement of fetal scalp electrode and placement of intrauterine catheter                      Discharge Day SOAP Note:  Progress Note - Vaginal Delivery  Jane Jackson is a 31 y.o. G2P1011 now PP day 2 s/p Vaginal, Spontaneous Delivery . Delivery was complicated by gestational diabetes and infection with maternal fever, fetal tachycardia, PROM  Subjective  The patient has the following complaints: has no unusual complaints  Pain is controlled with current medications.   Patient is urinating without difficulty.  She is ambulating well.    Objective  Vital signs: BP 138/82 (BP Location: Right Arm)   Pulse 73   Temp 98 F (36.7 C) (Oral)   Resp 14   Ht 5\' 4"  (1.626 m)   Wt 293 lb (132.9 kg)   LMP 06/13/2016   SpO2 100%   Breastfeeding? Unknown   BMI 50.29 kg/m   Physical Exam: Gen: NAD Fundus Fundal Tone: Firm  Lochia Amount: Small  Perineum Appearance: Intact     Data Review Labs: CBC Latest Ref Rng & Units 03/12/2017 01/23/2017 08/08/2016  WBC 3.6 - 11.0 K/uL 14.0(H) 13.7(H) 12.0(H)  Hemoglobin 12.0 - 16.0 g/dL 12.6 12.8 14.2  Hematocrit 35.0 - 47.0 % 37.0 37.1 41.4  Platelets 150 - 440 K/uL 290 304 279   A POS  Assessment/Plan  Active Problems:   Labor and delivery, indication for care    Plan for discharge today.   Discharge Instructions: Per After Visit Summary. Activity: Advance as tolerated. Pelvic rest for 6 weeks.  Also refer to After Visit Summary Diet: Regular Medications:  Outpatient follow up:  Follow-up Information    Harlin Heys, MD Follow up in 10 day(s).     Specialty:  Obstetrics and Gynecology Contact information: [address] [place] [address] [phone]          Postpartum contraception: discuss at 6 week check  Discharged Condition: good  Discharged to: home  Newborn Data: Disposition:home with mother  Apgars: APGAR (1 MIN): 6   APGAR (5 MINS): 8   APGAR (10 MINS):    Baby Feeding: Breast    Finis Bud, M.D. 03/15/2017 1:08 PM

## 2017-03-15 NOTE — Progress Notes (Signed)
RN in room giving pain medication; pt sharing with pt that she feels that "my swelling is getting progressively worse, I can't hardly walk, I can't feel to walk"; RN positively reinforced that the pt is walking back and forth to the bathroom and that she showered earlier in the shift; RN did tell pt that yes her feet/legs looked edematous and that some of that is pregnancy related; pt also sitting very upright and legs with pillows and blankets underneath them and elevated; RN encouraged pt to ambulate some and also change positions in the bed (try laying on one of your sides); pt things that won't help; she would like RN to let MD know; she also asked "will they give me something for my swelling?"; RN said the MD probably won't do mediation (like lasix) due to de-hydration risk but he might want to do TED hose or encourage her to ambulate more; RN will let MD know of pt's concern at 0600 when she calls about another concern (pt wondering if she's having "labs" drawn "since I had a fever after delivery"); (RN told pt that labs are usually drawn in the a.m. around 0700 and MD will be called at 0600 to discuss these two concerns)

## 2017-03-15 NOTE — Discharge Instructions (Signed)
Follow up sooner with fever, problems breathing, pain not helped by medications, severe depression( more than just baby blues, wanting to hurt yourself or the baby), severe bleeding ( saturating more than one pad an hour or large palm sized clots), no heavy lifting , no driving while taking narcotics, no douches, intercourse, tampons or enemas for 6 weeks  °

## 2017-03-15 NOTE — Progress Notes (Signed)
Patient knows to f/u in 10 days with Dr Amalia Hailey. No prescriptions given. Patient discharged home with family escorted out by cna.

## 2017-03-17 ENCOUNTER — Encounter: Payer: Self-pay | Admitting: Obstetrics and Gynecology

## 2017-03-18 ENCOUNTER — Encounter: Payer: Self-pay | Admitting: Certified Nurse Midwife

## 2017-03-18 ENCOUNTER — Ambulatory Visit (INDEPENDENT_AMBULATORY_CARE_PROVIDER_SITE_OTHER): Payer: Medicaid Other | Admitting: Certified Nurse Midwife

## 2017-03-18 VITALS — BP 140/98

## 2017-03-18 DIAGNOSIS — N61 Mastitis without abscess: Secondary | ICD-10-CM | POA: Insufficient documentation

## 2017-03-18 DIAGNOSIS — R300 Dysuria: Secondary | ICD-10-CM

## 2017-03-18 LAB — POCT URINALYSIS DIPSTICK
Bilirubin, UA: NEGATIVE
Glucose, UA: NEGATIVE
Ketones, UA: NEGATIVE
Leukocytes, UA: NEGATIVE
NITRITE UA: NEGATIVE
PROTEIN UA: NEGATIVE
SPEC GRAV UA: 1.015 (ref 1.010–1.025)
UROBILINOGEN UA: 0.2 U/dL
pH, UA: 6 (ref 5.0–8.0)

## 2017-03-18 MED ORDER — DICLOXACILLIN SODIUM 500 MG PO CAPS: 500.0000 mg | ORAL_CAPSULE | Freq: Four times a day (QID) | ORAL | 0 refills | Status: AC

## 2017-03-18 MED ORDER — OXYCODONE-ACETAMINOPHEN 5-325 MG PO TABS: 1.0000 | ORAL_TABLET | Freq: Four times a day (QID) | ORAL | 0 refills | Status: DC | PRN

## 2017-03-18 NOTE — Patient Instructions (Signed)
Breastfeeding and Mastitis  Mastitis is inflammation of the breast tissue. It can occur in women who are breastfeeding. This can make breastfeeding painful. Mastitis will sometimes go away on its own, especially if it is not caused by an infection (non-infectious mastitis). Your health care provider will help determine if medical treatment is needed. Treatment may be needed if the condition is caused by a bacterial infection (infectious mastitis).  What are the causes?  This condition is often associated with a blocked milkduct, which can happen when too much milk builds up in the breast. Causes of excess milk in the breast can include:  · Poor latch-on. If your baby is not latched onto the breast properly, he or she may not empty your breast completely while breastfeeding.  · Allowing too much time to pass between feedings.  · Wearing a bra or other clothing that is too tight. This puts extra pressure on the milk ducts so milk does not flow through them as it should.  · Milk remaining in the breast because it is overfilled (engorged).  · Stress and fatigue.    Mastitis can also be caused by a bacterial infection. Bacteria may enter the breast tissue through cuts, cracks, or openings in the skin near the nipple area. Cracks in the skin are often caused when your baby does not latch on properly to the breast.  What are the signs or symptoms?  Symptoms of this condition include:  · Swelling, redness, tenderness, and pain in an area of the breast. This usually affects the upper part of the breast, toward the armpit region. In most cases, it affects only one breast. In some cases, it may occur on both breasts at the same time and affect a larger portion of breast tissue.  · Swelling of the glands under the arm on the same side.  · Fatigue, headache, and flu-like muscle aches.  · Fever.  · Rapid pulse.    Symptoms usually last 2 to 5 days. Breast pain and redness are at their worst on day 2 and day 3, and they usually go  away by day 5. If an infection is left to progress, a collection of pus (abscess) may develop.  How is this diagnosed?  This condition can be diagnosed based on your symptoms and a physical exam. You may also have tests, such as:  · Blood tests to determine if your body is fighting a bacterial infection.  · Mammogram or ultrasound tests to rule out other problems or diseases.  · Fluid tests. If an abscess has developed, the fluid in the abscess may be removed with a needle. The fluid may be analyzed to determine if bacteria are present.  · Breast milk may be cultured and tested for bacteria.    How is this treated?  This condition will sometimes go away on its own. Your health care provider may choose to wait 24 hours after first seeing you to decide whether treatment is needed. If treatment is needed, it may include:  · Strategies to manage breastfeeding. This includes continuing to breastfeed or pump in order to allow adequate milk flow, using breast massage, and applying heat or cold to the affected area.  · Self-care such as rest and increased fluid intake.  · Medicine for pain.  · Antibiotic medicine to treat a bacterial infection. This is usually taken by mouth.  · If an abscess has developed, it may be treated by removing fluid with a needle.      Follow these instructions at home:  Medicines  · Take over-the-counter and prescription medicines only as told by your health care provider.  · If you were prescribed an antibiotic medicine, take it as told by your health care provider. Do not stop taking the antibiotic even if you start to feel better.  General instructions  · Do not wear a tight or underwire bra. Wear a soft, supportive bra.  · Increase your fluid intake, especially if you have a fever.  · Get plenty of rest.  For breastfeeding:  · Continue to empty your breasts as often as possible, either by breastfeeding or using an electric breast pump. This will lower the pressure and the pain that comes with  it. Ask your health care provider if changes need to be made to your breastfeeding or pumping routine.  · Keep your nipples clean and dry.  · During breastfeeding, empty the first breast completely before going to the other breast. If your baby is not emptying your breasts completely, use a breast pump to empty your breasts.  · Use breast massage during feeding or pumping sessions.  · If directed, apply moist heat to the affected area of your breast right before breastfeeding or pumping. Use the heat source that your health care provider recommends.  · If directed, put ice on the affected area of your breast right after breastfeeding or pumping:  ? Put ice in a plastic bag.  ? Place a towel between your skin and the bag.  ? Leave the ice on for 20 minutes.  · If you go back to work, pump your breasts while at work to stay in time with your nursing schedule.  · Do not allow your breasts to become engorged.  Contact a health care provider if:  · You have pus-like discharge from the breast.  · You have a fever.  · Your symptoms do not improve within 2 days of starting treatment.  · Your symptoms return after you have recovered from a breast infection.  Get help right away if:  · Your pain and swelling are getting worse.  · You have pain that is not controlled with medicine.  · You have a red line extending from the breast toward your armpit.  Summary  · Mastitis is inflammation of the breast tissue. It is often caused by a blocked milk duct or bacteria.  · This condition may be treated with hot and cold compresses, medicines, self-care, and certain breastfeeding strategies.  · If you were prescribed an antibiotic medicine, take it as told by your health care provider. Do not stop taking the antibiotic even if you start to feel better.  · Continue to empty your breasts as often as possible either by breastfeeding or using an electric breast pump.  This information is not intended to replace advice given to you by your  health care provider. Make sure you discuss any questions you have with your health care provider.  Document Released: 01/05/2005 Document Revised: 09/11/2016 Document Reviewed: 09/11/2016  Elsevier Interactive Patient Education © 2017 [employer]

## 2017-03-18 NOTE — Progress Notes (Signed)
GYN ENCOUNTER NOTE  Subjective:       Jane Jackson is a 31 y.o. G5P1011 female is here for gynecologic evaluation of the following issues:  1. Post partum x 4 days complains of breast pain, redness, fever, flu like symptoms and pain on "lady bits".     Gynecologic History Patient's last menstrual period was 06/13/2016. Contraception: abstinence   Obstetric History OB History  Gravida Para Term Preterm AB Living  '2 1 1   1 1  '$ SAB TAB Ectopic Multiple Live Births  1     0 1    # Outcome Date GA Lbr Len/2nd Weight Sex Delivery Anes PTL Lv  2 Term 03/13/17 [id number] / 00:47 6 lb 4.5 oz (2.85 kg) M Vag-Spont EPI  LIV  1 SAB               Past Medical History:  Diagnosis Date  . Anxiety   . Asthma   . Chicken pox   . Complication of anesthesia    issue with asthma and intubation  . Cystic dysplasia of one kidney    about 4 months ago-no f/u- largest 563m  . Diverticulitis   . Diverticulitis   . Fibroid, uterine    dx a few months ago  . GERD (gastroesophageal reflux disease)   . H/O mitral valve prolapse    as a child  . [place]  . Kidney stones     Past Surgical History:  Procedure Laterality Date  . APPENDECTOMY    . CHOLECYSTECTOMY N/A 01/18/2016   Procedure: LAPAROSCOPIC CHOLECYSTECTOMY WITH INTRAOPERATIVE CHOLANGIOGRAM;  Surgeon: BeExcell SeltzerMD;  Location: [facility];  Service: General;  Laterality: N/A;  . ruptured cyst    . TONSILLECTOMY AND ADENOIDECTOMY     age [age]. TYMPANOSTOMY TUBE PLACEMENT      Current Outpatient Prescriptions on File Prior to Visit  Medication Sig Dispense Refill  . ACCU-CHEK FASTCLIX LANCETS MISC Use four times daily as directed 102 each 12  . acetaminophen (TYLENOL) 325 MG tablet Take 2 tablets (650 mg total) by mouth every 6 (six) hours as needed for mild pain (or Fever >/= 101).    . Marland Kitchenlbuterol (ACCUNEB) 1.25 MG/3ML nebulizer solution Take 3 mLs by nebulization 2 (two) times daily as needed. Asthma. 75 mL 3   . albuterol (PROAIR HFA) 108 (90 Base) MCG/ACT inhaler INHALE 2 PUFFS INTO THE LUNGS EVERY 6 (SIX) HOURS AS NEEDED FOR WHEEZING OR SHORTNESS OF BREATH. 1 Inhaler 3  . Blood Glucose Monitoring Suppl (ACCU-CHEK AVIVA CONNECT) w/Device KIT 1 kit by Does not apply route 4 (four) times daily. Check glucose fasting, and 2 hours after each meal. 1 kit 0  . Prenat w/o A FeCbnFeGlu-FA &B6 (CITRANATAL B-CALM) 20-1 & 25 (2) MG MISC Take 1 tablet by mouth daily. 30 each 2  . Prenatal Vit-Fe Fumarate-FA (MULTIVITAMIN-PRENATAL) 27-0.8 MG TABS tablet Take 1 tablet by mouth daily at 12 noon.     No current facility-administered medications on file prior to visit.     Allergies  Allergen Reactions  . Latex Itching and Rash    Social History   Social History  . Marital status: Married    Spouse name: N/A  . Number of children: N/A  . Years of education: N/A   Occupational History  . Not on file.   Social History Main Topics  . Smoking status: Former SmResearch scientist (life sciences). Smokeless tobacco: Never Used     Comment:  Smokes socially.   . Alcohol use No  . Drug use: No  . Sexual activity: Not Currently    Partners: Male   Other Topics Concern  . Not on file   Social History Narrative  . No narrative on file    Family History  Problem Relation Age of Onset  . Arthritis Mother   . Hypertension Mother   . Diabetes Mother   . Cervical cancer Mother   . Alcohol abuse Father   . Arthritis Father   . Hypertension Father   . Arthritis Maternal Grandmother   . Heart disease Maternal Grandmother   . Stroke Maternal Grandmother   . Hypertension Maternal Grandmother   . Diabetes Maternal Grandmother   . Arthritis Maternal Grandfather   . Colon cancer Maternal Grandfather   . Prostate cancer Maternal Grandfather   . Heart disease Maternal Grandfather   . Stroke Maternal Grandfather   . Hypertension Maternal Grandfather   . Arthritis Paternal Grandmother   . Heart disease Paternal Grandmother   .  Hypertension Paternal Grandmother   . Diabetes Paternal Grandmother   . Arthritis Paternal Grandfather   . Heart disease Paternal Grandfather   . Hypertension Paternal Grandfather   . Thyroid disease Maternal Aunt        thyroid cancer    The following portions of the patient's history were reviewed and updated as appropriate: allergies, current medications, past family history, past medical history, past social history, past surgical history and problem list.  Review of Systems Review of Systems - General ROS: Positive for - chills, fatigue, fever, malaise and  night sweats Hematological and Lymphatic ROS: negative for - bleeding problems or swollen lymph nodes Gastrointestinal ROS: negative for - abdominal pain, blood in stools, change in bowel habits and nausea/vomiting Musculoskeletal ROS: negative for - joint pain, muscle pain or muscular weakness Genito-Urinary ROS: positive for burning with urination   Objective:   BP (!) 140/98   LMP 06/13/2016   Breastfeeding? Yes  CONSTITUTIONAL: Well-developed, well-nourished female in no acute distress.  HENT:  Normocephalic, atraumatic.  NECK: Normal range of motion, supple, no masses.  Normal thyroid.  SKIN: Skin is warm and dry. No rash noted. Not diaphoretic. No erythema. No pallor. [place]: Alert and oriented to person, place, and time.  PSYCHIATRIC: Tearful. Normal behavior. Normal judgment and thought content. CARDIOVASCULAR:Not Examined RESPIRATORY: Not Examined BREASTS: left breast hot to touch, tender red. Firm nodule palpated bellow axilla. Right breast : normal , non tender.  ABDOMEN: Soft, non distended; tender. Uterus firm -3 station  External Genitalia: Normal  BUS: Normal  Vagina: Normal, laceration near urethra noted, stitch present tissue healing well. Ist degree laceration healing well, stitches present.   Cervix: not examined  Uterus:not examined  Adnexa: not examined  Bladder: Not examined MUSCULOSKELETAL:  Normal range of motion. No tenderness.  No cyanosis, clubbing, or edema.  Assessment:   1. Burning with urination - POCT urinalysis dipstick  2. Mastitis    Plan:   Reviewed mastitis with pt. Encouraged warm compress, message of affected area and pumping affected breast to help alleviate clogged duct. Pt has an appointment this afternoon with lactation consultant. Antibiotics order. Discussed use of peri bottle during urination to decrease stinging from urination. Encouraged sitz bath to help with perineal pain. Prescription for 5 percocet given to help with perineal pain. Reviewed hormonal changes and postpartum depression. Pt tearful that she is not able to do enough to help and that she has not had  a shower in 5 days. Reviewed normal postpartum recovery and setting realistic expectations. Pt encouraged to rest and care for herself & baby. Also reviewed normal baby blues vs. PP depression. She verbalizes understanding .She denies desire to want to hurt herself or the baby. She will follow up as she is already scheduled with Dr. Amalia Hailey.   Philip Aspen, CNM

## 2017-03-24 ENCOUNTER — Other Ambulatory Visit: Payer: Self-pay | Admitting: Obstetrics and Gynecology

## 2017-04-09 ENCOUNTER — Ambulatory Visit (INDEPENDENT_AMBULATORY_CARE_PROVIDER_SITE_OTHER): Payer: Medicaid Other | Admitting: Cardiovascular Disease

## 2017-04-09 ENCOUNTER — Encounter: Payer: Self-pay | Admitting: Cardiovascular Disease

## 2017-04-09 VITALS — BP 108/60 | HR 95 | Ht 64.0 in | Wt 285.5 lb

## 2017-04-09 DIAGNOSIS — R000 Tachycardia, unspecified: Secondary | ICD-10-CM

## 2017-04-09 MED ORDER — METOPROLOL SUCCINATE ER 25 MG PO TB24: 25.0000 mg | ORAL_TABLET | Freq: Every day | ORAL | 1 refills | Status: DC

## 2017-04-09 MED ORDER — METOPROLOL SUCCINATE ER 25 MG PO TB24: 25.0000 mg | ORAL_TABLET | Freq: Every day | ORAL | 5 refills | Status: DC

## 2017-04-09 NOTE — Patient Instructions (Signed)
Medication Instructions:  Your physician has recommended you make the following change in your medication:  START taking metoprolol 25mg  once daily    Labwork: none  Testing/Procedures: none  Follow-Up: Your physician wants you to follow-up in: 6 months with Dr. Fletcher Anon.  You will receive a reminder letter in the mail two months in advance. If you don't receive a letter, please call our office to schedule the follow-up appointment.   Any Other Special Instructions Will Be Listed Below (If Applicable).     If you need a refill on your cardiac medications before your next appointment, please call your pharmacy.

## 2017-04-09 NOTE — Progress Notes (Signed)
Cardiology Office Note   Date:  04/09/2017   ID:  Jane Jackson, DOB 1986/07/10, MRN [id number]  PCP:  Coral Spikes, DO  Cardiologist:   Kathlyn Sacramento, MD   Chief Complaint  Patient presents with  . other    Follow up from post pregnancy tachycardia. Meds reviewed by the pt. verbally.       History of Present Illness: Jane Jackson is a 31 y.o. female who Is here today for a follow-up visit regarding suspected inappropriate sinus tachycardia. Symptoms worsened recently when she was pregnant but she delivered a healthy boy and reports some improvement in her symptoms although she continues to have significant resting tachycardia with occasional sudden drop. She complains of shortness of breath with no chest pain. She had an echocardiogram done recently which was normal. She reports that her mother and grandmother both have similar condition and they take her medication.   Past Medical History:  Diagnosis Date  . Anxiety   . Asthma   . Chicken pox   . Complication of anesthesia    issue with asthma and intubation  . Cystic dysplasia of one kidney    about 4 months ago-no f/u- largest 1mm?  . Diverticulitis   . Diverticulitis   . Fibroid, uterine    dx a few months ago  . GERD (gastroesophageal reflux disease)   . H/O mitral valve prolapse    as a child  . Impaction, bowel ([place])   . Kidney stones     Past Surgical History:  Procedure Laterality Date  . APPENDECTOMY    . CHOLECYSTECTOMY N/A 01/18/2016   Procedure: LAPAROSCOPIC CHOLECYSTECTOMY WITH INTRAOPERATIVE CHOLANGIOGRAM;  Surgeon: Excell Seltzer, MD;  Location: [facility];  Service: General;  Laterality: N/A;  . ruptured cyst    . TONSILLECTOMY AND ADENOIDECTOMY     age 24  . TYMPANOSTOMY TUBE PLACEMENT       Current Outpatient Prescriptions  Medication Sig Dispense Refill  . acetaminophen (TYLENOL) 325 MG tablet Take 2 tablets (650 mg total) by mouth every 6 (six) hours as needed for mild pain (or Fever  >/= 101).    Marland Kitchen albuterol (ACCUNEB) 1.25 MG/3ML nebulizer solution Take 3 mLs by nebulization 2 (two) times daily as needed. Asthma. 75 mL 3  . albuterol (PROAIR HFA) 108 (90 Base) MCG/ACT inhaler INHALE 2 PUFFS INTO THE LUNGS EVERY 6 (SIX) HOURS AS NEEDED FOR WHEEZING OR SHORTNESS OF BREATH. 1 Inhaler 3  . Prenatal Vit-Fe Fumarate-FA (MULTIVITAMIN-PRENATAL) 27-0.8 MG TABS tablet Take 1 tablet by mouth daily at 12 noon.     No current facility-administered medications for this visit.     Allergies:   Latex    Social History:  The patient  reports that she has quit smoking. She has never used smokeless tobacco. She reports that she does not drink alcohol or use drugs.   Family History:  The patient's family history includes Alcohol abuse in her father; Arthritis in her father, maternal grandfather, maternal grandmother, mother, paternal grandfather, and paternal grandmother; Cervical cancer in her mother; Colon cancer in her maternal grandfather; Diabetes in her maternal grandmother, mother, and paternal grandmother; Heart disease in her maternal grandfather, maternal grandmother, paternal grandfather, and paternal grandmother; Hypertension in her father, maternal grandfather, maternal grandmother, mother, paternal grandfather, and paternal grandmother; Prostate cancer in her maternal grandfather; Stroke in her maternal grandfather and maternal grandmother; Thyroid disease in her maternal aunt.    ROS:  Please see the history of present illness.  Otherwise, review of systems are positive for none.   All other systems are reviewed and negative.    PHYSICAL EXAM: VS:  BP 108/60 (BP Location: Right Arm, Patient Position: Sitting, Cuff Size: Large)   Pulse 95   Ht 5\' 4"  (1.626 m)   Wt 285 lb 8 oz (129.5 kg)   BMI 49.01 kg/m  , BMI Body mass index is 49.01 kg/m. GEN: Well nourished, well developed, in no acute distress  HEENT: normal  Neck: no JVD, carotid bruits, or masses Cardiac: RRR; no  murmurs, rubs, or gallops,no edema  Respiratory:  clear to auscultation bilaterally, normal work of breathing GI: soft, nontender, nondistended, + BS MS: no deformity or atrophy  Skin: warm and dry, no rash Neuro:  Strength and sensation are intact Psych: euthymic mood, full affect   EKG:  EKG is ordered today. The ekg ordered today demonstrates normal sinus rhythm with no significant ST or T wave changes.    Recent Labs: 08/08/2016: TSH 1.840 02/20/2017: ALT 7; BUN 6; Creatinine, Ser 0.65; Potassium 4.1; Sodium 136 03/12/2017: Hemoglobin 12.6; Platelets 290    Lipid Panel No results found for: CHOL, TRIG, HDL, CHOLHDL, VLDL, LDLCALC, LDLDIRECT    Wt Readings from Last 3 Encounters:  04/09/17 285 lb 8 oz (129.5 kg)  03/05/17 296 lb 6.4 oz (134.4 kg)  02/28/17 293 lb 12 oz (133.2 kg)       No flowsheet data found.    ASSESSMENT AND PLAN:  1.  Inappropriate sinus tachycardia:  The patient's symptoms improved after delivery but she continues to have significant limitations. I discussed with her the importance of hydration and regular exercise. I elected to start on small dose Toprol 25 mg once daily.   Disposition:   FU with me in 6 months  Signed,  Kathlyn Sacramento, MD  04/09/2017 4:27 PM    [place]

## 2017-05-07 ENCOUNTER — Ambulatory Visit (INDEPENDENT_AMBULATORY_CARE_PROVIDER_SITE_OTHER): Payer: Medicaid Other | Admitting: Obstetrics and Gynecology

## 2017-05-07 ENCOUNTER — Encounter: Payer: Self-pay | Admitting: Obstetrics and Gynecology

## 2017-05-07 NOTE — Progress Notes (Signed)
HPI:      Jane Jackson is a 31 y.o. G2P1011 who LMP was No LMP recorded.  Subjective:   She presents today Partially 7 weeks postpartum. She is breast-feeding time. She does not desire birth control at this time. But she is considering her options for the future. She does complain of occasional urethral burning at random times not associated with urination.    Hx: The following portions of the patient's history were reviewed and updated as appropriate:             She  has a past medical history of Anxiety; Asthma; Chicken pox; Complication of anesthesia; Cystic dysplasia of one kidney; Diverticulitis; Diverticulitis; Fibroid, uterine; GERD (gastroesophageal reflux disease); H/O mitral valve prolapse; Impaction, bowel ([place]); Kidney stones; and Tachycardia. She  does not have any pertinent problems on file. She  has a past surgical history that includes Appendectomy; Tonsillectomy and adenoidectomy; ruptured cyst; Tympanostomy tube placement; and Cholecystectomy (N/A, 01/18/2016). Her family history includes Alcohol abuse in her father; Arthritis in her father, maternal grandfather, maternal grandmother, mother, paternal grandfather, and paternal grandmother; Cervical cancer in her mother; Colon cancer in her maternal grandfather; Diabetes in her maternal grandmother, mother, and paternal grandmother; Heart disease in her maternal grandfather, maternal grandmother, paternal grandfather, and paternal grandmother; Hypertension in her father, maternal grandfather, maternal grandmother, mother, paternal grandfather, and paternal grandmother; Prostate cancer in her maternal grandfather; Stroke in her maternal grandfather and maternal grandmother; Thyroid disease in her maternal aunt. She  reports that she has quit smoking. She has never used smokeless tobacco. She reports that she does not drink alcohol or use drugs. She is allergic to latex.       Review of Systems:  Review of  Systems  Constitutional: Denied constitutional symptoms, night sweats, recent illness, fatigue, fever, insomnia and weight loss.  Eyes: Denied eye symptoms, eye pain, photophobia, vision change and visual disturbance.  Ears/Nose/Throat/Neck: Denied ear, nose, throat or neck symptoms, hearing loss, nasal discharge, sinus congestion and sore throat.  Cardiovascular: Denied cardiovascular symptoms, arrhythmia, chest pain/pressure, edema, exercise intolerance, orthopnea and palpitations.  Respiratory: Denied pulmonary symptoms, asthma, pleuritic pain, productive sputum, cough, dyspnea and wheezing.  Gastrointestinal: Denied, gastro-esophageal reflux, melena, nausea and vomiting.  Genitourinary: See HPI for additional information.  Musculoskeletal: Denied musculoskeletal symptoms, stiffness, swelling, muscle weakness and myalgia.  Dermatologic: Denied dermatology symptoms, rash and scar.  Neurologic: Denied neurology symptoms, dizziness, headache, neck pain and syncope.  Psychiatric: Denied psychiatric symptoms, anxiety and depression.  Endocrine: Denied endocrine symptoms including hot flashes and night sweats.   Meds:   Current Outpatient Prescriptions on File Prior to Visit  Medication Sig Dispense Refill  . acetaminophen (TYLENOL) 325 MG tablet Take 2 tablets (650 mg total) by mouth every 6 (six) hours as needed for mild pain (or Fever >/= 101).    Marland Kitchen albuterol (ACCUNEB) 1.25 MG/3ML nebulizer solution Take 3 mLs by nebulization 2 (two) times daily as needed. Asthma. 75 mL 3  . albuterol (PROAIR HFA) 108 (90 Base) MCG/ACT inhaler INHALE 2 PUFFS INTO THE LUNGS EVERY 6 (SIX) HOURS AS NEEDED FOR WHEEZING OR SHORTNESS OF BREATH. 1 Inhaler 3  . metoprolol succinate (TOPROL-XL) 25 MG 24 hr tablet Take 1 tablet (25 mg total) by mouth daily. Take with or immediately following a meal. 90 tablet 1  . Prenatal Vit-Fe Fumarate-FA (MULTIVITAMIN-PRENATAL) 27-0.8 MG TABS tablet Take 1 tablet by mouth daily at  12 noon.     No current facility-administered medications on  file prior to visit.     Objective:     Vitals:   05/07/17 1542  BP: 123/85  Pulse: (!) 106              Pelvic examination   Pelvic:   Vulva: Normal appearance.  No lesions.  No abnormal scarring.    Vagina: No lesions or abnormalities noted.  Support: Normal pelvic support.  Urethra No masses tenderness or scarring.  Meatus Normal size without lesions or prolapse.  Cervix: Normal ectropion.  No lesions.  Anus: Normal exam.  No lesions.  Perineum: Normal exam.  No lesions.  Healed well.          Bimanual   Uterus: Normal size.  Non-tender.  Mobile.  AV.  Adnexae: No masses.  Non-tender to palpation.  Cul-de-sac: Negative for abnormality.     Assessment:    G2P1011 Patient Active Problem List   Diagnosis Date Noted  . Mastitis 03/18/2017  . Labor and delivery, indication for care 03/12/2017  . Pregnancy 02/23/2017  . Poor weight gain of pregnancy, second trimester 01/11/2017  . Abnormal first trimester screen   . Family history of cystic fibrosis   . Family history of mental retardation   . Moderate persistent asthma without complication 72/53/6644  . Nausea and vomiting during pregnancy 09/16/2016  . History of mitral valve prolapse 09/16/2016  . Morbid obesity with BMI of 40.0-44.9, adult ([place]) 01/14/2016     1. Postpartum care and examination immediately after delivery     No urethral abnormalities noted no reason for her urethral pain. She is absolutely certain is not a UTI. She also states that she has completed treatment for yeast infection and does not believe she has a yeast infection She is otherwise well postpartum.   Plan:            1.  Birth Control I discussed multiple birth control options and methods with the patient.  The risks and benefits of each were reviewed. When she decides upon a method she will contact us. 2.  Discussed possibility of calcium oxalate crystals in her  urine. Low calcium oxalate diet reviewed and discussed. Orders No orders of the defined types were placed in this encounter.    Meds ordered this encounter  Medications  . ACCU-CHEK AVIVA PLUS test strip    Sig: See admin instructions.    Refill:  12  . ACCU-CHEK SOFTCLIX LANCETS lancets    Sig: 4 (four) times daily. as directed    Refill:  12        F/U  Return in about 3 months (around 08/07/2017).  Finis Bud, M.D. 05/07/2017 4:51 PM

## 2017-05-18 ENCOUNTER — Other Ambulatory Visit: Payer: Self-pay | Admitting: Obstetrics and Gynecology

## 2017-05-20 ENCOUNTER — Other Ambulatory Visit: Payer: Self-pay | Admitting: Obstetrics and Gynecology

## 2017-05-20 DIAGNOSIS — O99519 Diseases of the respiratory system complicating pregnancy, unspecified trimester: Secondary | ICD-10-CM

## 2017-05-20 DIAGNOSIS — Z3483 Encounter for supervision of other normal pregnancy, third trimester: Secondary | ICD-10-CM

## 2017-05-20 DIAGNOSIS — J45909 Unspecified asthma, uncomplicated: Secondary | ICD-10-CM

## 2017-06-28 ENCOUNTER — Telehealth: Payer: Self-pay | Admitting: Obstetrics and Gynecology

## 2017-06-28 ENCOUNTER — Encounter: Payer: Self-pay | Admitting: Obstetrics and Gynecology

## 2017-06-28 NOTE — Telephone Encounter (Signed)
Patient called and stated that she is breast feeding and that she is unsure if she can use "Similasan- Sterile Pink Eye Relief Drops". For pink eye relief. No other information was disclosed other than wanting to speak with a nurse today to confirm the use of the over the counter drops. Please advise.

## 2017-06-28 NOTE — Telephone Encounter (Signed)
Sent pt a mychart message. 

## 2017-12-10 ENCOUNTER — Other Ambulatory Visit: Payer: Self-pay

## 2017-12-10 ENCOUNTER — Encounter: Payer: Self-pay | Admitting: Obstetrics and Gynecology

## 2017-12-11 MED ORDER — METOPROLOL SUCCINATE ER 25 MG PO TB24: 25.0000 mg | ORAL_TABLET | Freq: Every day | ORAL | 0 refills | Status: DC

## 2017-12-16 ENCOUNTER — Emergency Department: Payer: BLUE CROSS/BLUE SHIELD

## 2017-12-16 ENCOUNTER — Encounter: Payer: Self-pay | Admitting: *Deleted

## 2017-12-16 ENCOUNTER — Emergency Department
Admission: EM | Admit: 2017-12-16 | Discharge: 2017-12-16 | Disposition: A | Discharge status: Home / Self Care | Payer: BLUE CROSS/BLUE SHIELD | Attending: Emergency Medicine | Admitting: Emergency Medicine

## 2017-12-16 DIAGNOSIS — Z91040 Latex allergy status: Secondary | ICD-10-CM | POA: Diagnosis not present

## 2017-12-16 DIAGNOSIS — J45909 Unspecified asthma, uncomplicated: Secondary | ICD-10-CM | POA: Diagnosis not present

## 2017-12-16 DIAGNOSIS — Z79899 Other long term (current) drug therapy: Secondary | ICD-10-CM | POA: Diagnosis not present

## 2017-12-16 DIAGNOSIS — R079 Chest pain, unspecified: Secondary | ICD-10-CM | POA: Diagnosis present

## 2017-12-16 DIAGNOSIS — Z87891 Personal history of nicotine dependence: Secondary | ICD-10-CM | POA: Insufficient documentation

## 2017-12-16 DIAGNOSIS — R0600 Dyspnea, unspecified: Secondary | ICD-10-CM | POA: Diagnosis not present

## 2017-12-16 LAB — CBC
HCT: 42.1 % (ref 35.0–47.0)
HEMOGLOBIN: 13.9 g/dL (ref 12.0–16.0)
MCH: 29.8 pg (ref 26.0–34.0)
MCHC: 33 g/dL (ref 32.0–36.0)
MCV: 90.1 fL (ref 80.0–100.0)
Platelets: 320 10*3/uL (ref 150–440)
RBC: 4.67 MIL/uL (ref 3.80–5.20)
RDW: 13.3 % (ref 11.5–14.5)
WBC: 10.4 10*3/uL (ref 3.6–11.0)

## 2017-12-16 LAB — BASIC METABOLIC PANEL
ANION GAP: 10 (ref 5–15)
BUN: 9 mg/dL (ref 6–20)
CALCIUM: 8.8 mg/dL — AB (ref 8.9–10.3)
CO2: 21 mmol/L — ABNORMAL LOW (ref 22–32)
Chloride: 107 mmol/L (ref 101–111)
Creatinine, Ser: 0.79 mg/dL (ref 0.44–1.00)
GLUCOSE: 151 mg/dL — AB (ref 65–99)
Potassium: 3.8 mmol/L (ref 3.5–5.1)
Sodium: 138 mmol/L (ref 135–145)

## 2017-12-16 LAB — TROPONIN I

## 2017-12-16 NOTE — Discharge Instructions (Signed)
You have been seen in the emergency department today for chest tightness.  Your workup has shown normal results. As we discussed please follow-up with your primary care physician in the next 1-2 days for recheck. Return to the emergency department for any further chest pain, trouble breathing, or any other symptom personally concerning to yourself.

## 2017-12-16 NOTE — ED Triage Notes (Signed)
Pt says that she feels like she has a "tight bandage around my chest'. Pt says she does have a hx of asthma and feels like she cannot take a deep breath, but she does not feel it is related to her asthma. Pain started in the middle of the night last night, associated with nausea, dizziness and pain in between the shoulders.

## 2017-12-16 NOTE — ED Provider Notes (Signed)
[facility] Emergency Department Provider Note  Time seen: 10:53 PM  I have reviewed the triage vital signs and the nursing notes.   HISTORY  Chief Complaint Chest Pain    HPI Jane Jackson is a 32 y.o. female with a past medical history of anxiety, asthma, gastric reflux, presents to the emergency department for chest tightness.  According to the patient since last night she has been experiencing a tightness sensation to her chest.  She describes it as a band around her chest and feels like she cannot get a full deep breath of air.  Denies any chest pain.  Denies any recent cough congestion.  Tried a breathing treatment at home with minimal relief so she went to an urgent care and they sent her to the emergency department for evaluation.  Patient denies any fever, abdominal pain, nausea vomiting or diarrhea.  Denies any diaphoresis.  No leg pain or swelling.   Past Medical History:  Diagnosis Date  . Anxiety   . Asthma   . Chicken pox   . Complication of anesthesia    issue with asthma and intubation  . Cystic dysplasia of one kidney    about 4 months ago-no f/u- largest 81mm?  . Diverticulitis   . Diverticulitis   . Fibroid, uterine    dx a few months ago  . GERD (gastroesophageal reflux disease)   . H/O mitral valve prolapse    as a child  . Impaction, bowel ([place])   . Kidney stones   . Tachycardia     Patient Active Problem List   Diagnosis Date Noted  . Mastitis 03/18/2017  . Labor and delivery, indication for care 03/12/2017  . Pregnancy 02/23/2017  . Poor weight gain of pregnancy, second trimester 01/11/2017  . Abnormal first trimester screen   . Family history of cystic fibrosis   . Family history of mental retardation   . Moderate persistent asthma without complication 44/81/8563  . Nausea and vomiting during pregnancy 09/16/2016  . History of mitral valve prolapse 09/16/2016  . Morbid obesity with BMI of 40.0-44.9, adult ([place])  01/14/2016    Past Surgical History:  Procedure Laterality Date  . APPENDECTOMY    . CHOLECYSTECTOMY N/A 01/18/2016   Procedure: LAPAROSCOPIC CHOLECYSTECTOMY WITH INTRAOPERATIVE CHOLANGIOGRAM;  Surgeon: Excell Seltzer, MD;  Location: [facility];  Service: General;  Laterality: N/A;  . ruptured cyst    . TONSILLECTOMY AND ADENOIDECTOMY     age 68  . TYMPANOSTOMY TUBE PLACEMENT      Prior to Admission medications   Medication Sig Start Date End Date Taking? Authorizing Provider  ACCU-CHEK AVIVA PLUS test strip See admin instructions. 04/06/17   [provider]  ACCU-CHEK SOFTCLIX LANCETS lancets 4 (four) times daily. as directed 04/06/17   [provider]  acetaminophen (TYLENOL) 325 MG tablet Take 2 tablets (650 mg total) by mouth every 6 (six) hours as needed for mild pain (or Fever >/= 101). 01/19/16   Riebock, Emina, NP  ADVAIR DISKUS 100-50 MCG/DOSE AEPB INHALE 1 PUFF INTO THE LUNGS 2 (TWO) TIMES DAILY. 05/18/17   Rubie Maid, MD  albuterol (ACCUNEB) 1.25 MG/3ML nebulizer solution Take 3 mLs by nebulization 2 (two) times daily as needed. Asthma. 01/03/17   Rubie Maid, MD  albuterol (PROVENTIL HFA;VENTOLIN HFA) 108 (90 Base) MCG/ACT inhaler INHALE 2 PUFFS INTO THE LUNGS EVERY 6 (SIX) HOURS AS NEEDED FOR WHEEZING OR SHORTNESS OF BREATH. 05/20/17   Rubie Maid, MD  metoprolol succinate (TOPROL-XL) 25  MG 24 hr tablet Take 1 tablet (25 mg total) by mouth daily. Take with or immediately following a meal. 12/11/17 03/11/18  Wellington Hampshire, MD  Prenatal Vit-Fe Fumarate-FA (MULTIVITAMIN-PRENATAL) 27-0.8 MG TABS tablet Take 1 tablet by mouth daily at 12 noon.    [provider]    Allergies  Allergen Reactions  . Latex Itching and Rash    Family History  Problem Relation Age of Onset  . Arthritis Mother   . Hypertension Mother   . Diabetes Mother   . Cervical cancer Mother   . Alcohol abuse Father   . Arthritis Father   . Hypertension Father   . Arthritis  Maternal Grandmother   . Heart disease Maternal Grandmother   . Stroke Maternal Grandmother   . Hypertension Maternal Grandmother   . Diabetes Maternal Grandmother   . Arthritis Maternal Grandfather   . Colon cancer Maternal Grandfather   . Prostate cancer Maternal Grandfather   . Heart disease Maternal Grandfather   . Stroke Maternal Grandfather   . Hypertension Maternal Grandfather   . Arthritis Paternal Grandmother   . Heart disease Paternal Grandmother   . Hypertension Paternal Grandmother   . Diabetes Paternal Grandmother   . Arthritis Paternal Grandfather   . Heart disease Paternal Grandfather   . Hypertension Paternal Grandfather   . Thyroid disease Maternal Aunt        thyroid cancer    Social History Social History   Tobacco Use  . Smoking status: Former Research scientist (life sciences)  . Smokeless tobacco: Never Used  . Tobacco comment: Smokes socially.   Substance Use Topics  . Alcohol use: No    Alcohol/week: 0.0 oz  . Drug use: No    Review of Systems Constitutional: Negative for fever. Eyes: Negative for visual complaints ENT: Negative for recent illness/congestion Cardiovascular: Tightness in her chest but denies any chest pain. Respiratory: Feels like she cannot get a full deep breath of air.  Denies any pain with deep inspiration. Gastrointestinal: Negative for abdominal pain Genitourinary: Negative for urinary compaints Musculoskeletal: Negative for leg pain or swelling. Skin: Negative for skin complaints  Neurological: Negative for headache All other ROS negative  ____________________________________________   PHYSICAL EXAM:  VITAL SIGNS: ED Triage Vitals  Enc Vitals Group     BP 12/16/17 2034 135/90     Pulse Rate 12/16/17 2034 94     Resp 12/16/17 2034 (!) 22     Temp 12/16/17 2034 98.4 F (36.9 C)     Temp src --      SpO2 12/16/17 2034 96 %     Weight 12/16/17 2034 285 lb (129.3 kg)     Height 12/16/17 2034 5\' 4"  (1.626 m)     Head Circumference --       Peak Flow --      Pain Score 12/16/17 2033 6     Pain Loc --      Pain Edu? --      Excl. in [place]? --     Constitutional: Alert and oriented. Well appearing and in no distress. Eyes: Normal exam ENT   Head: Normocephalic and atraumatic.   Nose: No congestion/rhinnorhea.   Mouth/Throat: Mucous membranes are moist. Cardiovascular: Normal rate, regular rhythm. No murmurs, rubs, or gallops. Respiratory: Normal respiratory effort without tachypnea nor retractions. Breath sounds are clear Gastrointestinal: Soft and nontender. No distention. Musculoskeletal: Nontender with normal range of motion in all extremities. No lower extremity tenderness or edema. Neurologic:  Normal speech and language.  No gross focal neurologic deficits Skin:  Skin is warm, dry and intact.  Psychiatric: Mood and affect are normal  ____________________________________________    EKG  EKG reviewed and interpreted by myself shows normal sinus rhythm 84 bpm with a narrow QRS, normal axis, normal intervals, no concerning ST changes.  ____________________________________________    RADIOLOGY  Chest x-ray clear  ____________________________________________   INITIAL IMPRESSION / ASSESSMENT AND PLAN / ED COURSE  Pertinent labs & imaging results that were available during my care of the patient were reviewed by me and considered in my medical decision making (see chart for details).  Patient presents to the emergency department for chest tightness since last night.  Overall the patient appears extremely well, normal physical examination.  Differential would include asthma, ACS, URI.  Patient's lungs are clear with good air movement.  No wheeze.  Chest x-ray is clear, labs are reassuring including negative troponin.  Overall the patient appears well, discussed with the patient to continue to use her albuterol treatments at home as needed and to follow-up with her doctor.  Discussed return precautions for any  chest pain or worsening trouble breathing.  Patient agreeable to this plan.  ____________________________________________   FINAL CLINICAL IMPRESSION(S) / ED DIAGNOSES  Dyspnea    Harvest Dark, MD 12/16/17 2256

## 2018-02-04 ENCOUNTER — Ambulatory Visit: Payer: Self-pay | Admitting: Cardiovascular Disease

## 2018-04-15 ENCOUNTER — Ambulatory Visit: Payer: BLUE CROSS/BLUE SHIELD | Admitting: Cardiovascular Disease

## 2018-05-29 ENCOUNTER — Ambulatory Visit: Payer: BLUE CROSS/BLUE SHIELD | Admitting: Nurse Practitioner

## 2018-07-02 ENCOUNTER — Encounter

## 2018-07-02 ENCOUNTER — Ambulatory Visit (INDEPENDENT_AMBULATORY_CARE_PROVIDER_SITE_OTHER): Payer: BLUE CROSS/BLUE SHIELD | Admitting: Nurse Practitioner

## 2018-07-02 ENCOUNTER — Encounter: Payer: Self-pay | Admitting: Nurse Practitioner

## 2018-07-02 VITALS — BP 126/80 | HR 75 | Ht 64.0 in | Wt 278.3 lb

## 2018-07-02 DIAGNOSIS — Z6841 Body Mass Index (BMI) 40.0 and over, adult: Secondary | ICD-10-CM

## 2018-07-02 DIAGNOSIS — R000 Tachycardia, unspecified: Secondary | ICD-10-CM

## 2018-07-02 MED ORDER — METOPROLOL SUCCINATE ER 25 MG PO TB24: 37.5000 mg | ORAL_TABLET | Freq: Every day | ORAL | 3 refills | Status: DC

## 2018-07-02 NOTE — Progress Notes (Signed)
Office Visit    Patient Name: Jane Jackson Date of Encounter: 07/02/2018  Primary Care Provider:  Coral Spikes, DO Primary Cardiologist:  Kathlyn Sacramento, MD  Chief Complaint    32 y/o ? with a h/o inappropriate sinus tachycardia, morbid obesity, nephrolithiasis, and GERD, who presents for f/u related to palpitations and tachycardia.  Past Medical History    Past Medical History:  Diagnosis Date  . Anxiety   . Asthma   . Chicken pox   . Complication of anesthesia    issue with asthma and intubation  . Cystic dysplasia of one kidney    about 4 months ago-no f/u- largest 35mm?  . Diverticulitis   . Fibroid, uterine    dx a few months ago  . GERD (gastroesophageal reflux disease)   . H/O mitral valve prolapse    as a child  . Impaction, bowel ([place])   . Inappropriate sinus tachycardia    a. exacerbated by pregnancy - 2018; b. 01/2017 Holter: 29% of time in sinus tachycardia;  c. 02/2017 Echo: EF 65-70%, no rwma; c. 01/2017 Holter: 29% of time in sinus tachycardia.  . Kidney stones    Past Surgical History:  Procedure Laterality Date  . APPENDECTOMY    . CHOLECYSTECTOMY N/A 01/18/2016   Procedure: LAPAROSCOPIC CHOLECYSTECTOMY WITH INTRAOPERATIVE CHOLANGIOGRAM;  Surgeon: Excell Seltzer, MD;  Location: [facility];  Service: General;  Laterality: N/A;  . ruptured cyst    . TONSILLECTOMY AND ADENOIDECTOMY     age 20  . TYMPANOSTOMY TUBE PLACEMENT      Allergies  Allergies  Allergen Reactions  . Latex Itching and Rash    History of Present Illness    32 year old female with a history of inappropriate sinus tachycardia, morbid obesity, nephrolithiasis, and GERD.  She was previously diagnosed with inappropriate sinus tachycardia in 2018, during her pregnancy.  She wore a Holter monitor at that time showing sinus tachycardia for 29% of the time that the monitor was worn.  Echocardiogram showed normal LV function without regional wall motion abnormalities.  Symptoms improved  slightly on Toprol-XL 25 mg daily and more so after delivering.  She says that at one point after delivery, she actually weaned herself off of beta-blocker therapy but then again noted recurrent episodes of tachycardia at random times.  As result, she resumed Toprol-XL.  She has been compliant with 25 mg daily but has noted that heart rates will still frequently elevate without rhyme or reason and she thinks that probably she needs a higher dose.  Blood pressures typically run on the soft [id number] 10 systolic but is 929/24 today.  With the exception of noting palpitations, she is generally asymptomatic.  She has been trying to watch her diet and lose weight and she is down 5 pounds since her last visit.  She denies chest pain, dyspnea, PND, orthopnea, dizziness, syncope, edema, or early satiety.  Home Medications    Prior to Admission medications   Medication Sig Start Date End Date Taking? Authorizing Provider  ADVAIR DISKUS 100-50 MCG/DOSE AEPB INHALE 1 PUFF INTO THE LUNGS 2 (TWO) TIMES DAILY. 05/18/17  Yes Rubie Maid, MD  albuterol (ACCUNEB) 1.25 MG/3ML nebulizer solution Take 3 mLs by nebulization 2 (two) times daily as needed. Asthma. 01/03/17  Yes Rubie Maid, MD  albuterol (PROVENTIL HFA;VENTOLIN HFA) 108 (90 Base) MCG/ACT inhaler INHALE 2 PUFFS INTO THE LUNGS EVERY 6 (SIX) HOURS AS NEEDED FOR WHEEZING OR SHORTNESS OF BREATH. 05/20/17  Yes Rubie Maid, MD  metoprolol succinate (TOPROL-XL) 25 MG 24 hr tablet Take 1 tablet (25 mg total) by mouth daily. Take with or immediately following a meal. 12/11/17 07/02/18 Yes Arida, Mertie Clause, MD  Prenatal Vit-Fe Fumarate-FA (MULTIVITAMIN-PRENATAL) 27-0.8 MG TABS tablet Take 1 tablet by mouth daily at 12 noon.   Yes [provider]    Review of Systems    Tachypalpitations as noted above.  She denies chest pain, dyspnea, pnd, orthopnea, n, v, dizziness, syncope, edema, weight gain, or early satiety.  All other systems reviewed and are  otherwise negative except as noted above.  Physical Exam    VS:  BP 126/80 (BP Location: Left Arm, Patient Position: Sitting, Cuff Size: Large)   Pulse 75   Ht 5\' 4"  (1.626 m)   Wt 278 lb 4.8 oz (126.2 kg)   BMI 47.77 kg/m  , BMI Body mass index is 47.77 kg/m. GEN: Obese, in no acute distress. HEENT: normal. Neck: Supple, no JVD, carotid bruits, or masses. Cardiac: RRR, no murmurs, rubs, or gallops. No clubbing, cyanosis, edema.  Radials/DP/PT 2+ and equal bilaterally.  Respiratory:  Respirations regular and unlabored, clear to auscultation bilaterally. GI: Soft, nontender, nondistended, BS + x 4. MS: no deformity or atrophy. Skin: warm and dry, no rash. Neuro:  Strength and sensation are intact. Psych: Normal affect.  Accessory Clinical Findings    ECG personally reviewed by me today -regular sinus rhythm, 75, no acute ST or T changes.  Assessment & Plan    1.  Inappropriate sinus tachycardia: This was first noted during her pregnancy in 2018 and improved slightly following delivery and also with the addition of beta-blocker therapy.  She noted more frequent symptoms when she try to get herself off of beta-blocker therapy and now, despite beta-blocker, she still notes intermittent sinus tachycardia without associated symptoms.  She would like to try a higher dose of beta-blocker.  We discussed the importance of adequate hydration and also activity and weight loss.  She has a keen interest in trying to lose weight at this point, saying that the baby weight never really came off.  We discussed this at length and she is also interested in a weight management clinic referral.  In the interim, I will increase her Toprol-XL to 37.5 mg daily.  We agreed that if palpitations persist and pressures are stable, she will contact us, at which time we would consider going up to 50 mill grams daily.  2.  Morbid obesity: Jane Jackson and I discussed obesity and weight loss for approximately 15 minutes.   She has specific questions about different types of fat diet and discussed seeking on a diet but a lifestyle change.  We discussed the utility of weight watchers as well as calorie counting applications for her phone.  She will look into these further.  As above, at her request, I have referred to weight management clinic.  3.  Disposition: We spent 35 mins discussing symptoms, mgmt plan, and wt loss. Follow-up in 6 months or sooner if necessary.  Murray Hodgkins, NP 07/02/2018, 4:52 PM

## 2018-07-02 NOTE — Patient Instructions (Addendum)
Medication Instructions:  - Your physician has recommended you make the following change in your medication:   1) INCREASE toprol (metoprolol succinate) 25 mg- take 1.5 tablets (37.5 mg) by mouth once daily  If you need a refill on your cardiac medications before your next appointment, please call your pharmacy.   Lab work: - none ordered  If you have labs (blood work) drawn today and your tests are completely normal, you will receive your results only by: Marland Kitchen MyChart Message (if you have MyChart) OR . A paper copy in the mail If you have any lab test that is abnormal or we need to change your treatment, we will call you to review the results.  Testing/Procedures: - none ordered  Follow-Up: At [facility], you and your health needs are our priority.  As part of our continuing mission to provide you with exceptional heart care, we have created designated Provider Care Teams.  These Care Teams include your primary Cardiologist (physician) and Advanced Practice Providers (APPs -  Physician Assistants and Nurse Practitioners) who all work together to provide you with the care you need, when you need it. You will need a follow up appointment in 6 months.  Please call our office 2 months in advance to schedule this appointment.  You may see Kathlyn Sacramento, MD or one of the following Advanced Practice Providers on your designated Care Team:   Murray Hodgkins, NP Christell Faith, PA-C . Marrianne Mood, PA-C   - You have been referred to : [facility] They should be in touch with you to schedule, but if you do not hear anything from them in the next 2-3 weeks, please call us back and let us know- [phone].  Any Other Special Instructions Will Be Listed Below (If Applicable). - N/A

## 2019-02-17 ENCOUNTER — Telehealth: Payer: Self-pay

## 2019-02-17 NOTE — Telephone Encounter (Signed)
Copied from [place] [id number]. Topic: General - Inquiry >> Feb 17, 2019  8:36 AM Richardo Priest, NT wrote: Reason for CRM: Patient is calling in wanting to set up as a new patient with Dr.Scott. Patient would be a TOC. Patient is hoping to get in with her if not they she would be willing to set up with her asa new patient. Call back at [phone] and she would like to get a message through mychart to see what provider was chosen for her.

## 2019-02-17 NOTE — Telephone Encounter (Signed)
I left the patient a message to inform her that Dr. Nicki Reaper is not excepting new patient's or TOC . She may establish care with Philis Nettle, NP , Mable Paris, NP or Dr. Karlyn Agee -Scocuzza.

## 2019-03-30 ENCOUNTER — Encounter: Payer: Self-pay | Admitting: Certified Nurse Midwife

## 2019-03-30 ENCOUNTER — Other Ambulatory Visit: Payer: Self-pay

## 2019-03-30 ENCOUNTER — Ambulatory Visit (INDEPENDENT_AMBULATORY_CARE_PROVIDER_SITE_OTHER): Payer: BLUE CROSS/BLUE SHIELD | Admitting: Certified Nurse Midwife

## 2019-03-30 VITALS — BP 118/74 | HR 89 | Ht 64.0 in | Wt 297.4 lb

## 2019-03-30 DIAGNOSIS — R635 Abnormal weight gain: Secondary | ICD-10-CM

## 2019-03-30 DIAGNOSIS — R102 Pelvic and perineal pain unspecified side: Secondary | ICD-10-CM

## 2019-03-30 DIAGNOSIS — R5383 Other fatigue: Secondary | ICD-10-CM

## 2019-03-30 NOTE — Patient Instructions (Signed)
Ovarian Cyst     An ovarian cyst is a fluid-filled sac that forms on an ovary. The ovaries are small organs that produce eggs in women. Various types of cysts can form on the ovaries. Some may cause symptoms and require treatment. Most ovarian cysts go away on their own, are not cancerous (are benign), and do not cause problems. Common types of ovarian cysts include:  Functional (follicle) cysts. ? Occur during the menstrual cycle, and usually go away with the next menstrual cycle if you do not get pregnant. ? Usually cause no symptoms.  Endometriomas. ? Are cysts that form from the tissue that lines the uterus (endometrium). ? Are sometimes called "chocolate cysts" because they become filled with blood that turns brown. ? Can cause pain in the lower abdomen during intercourse and during your period.  Cystadenoma cysts. ? Develop from cells on the outside surface of the ovary. ? Can get very large and cause lower abdomen pain and pain with intercourse. ? Can cause severe pain if they twist or break open (rupture).  Dermoid cysts. ? Are sometimes found in both ovaries. ? May contain different kinds of body tissue, such as skin, teeth, hair, or cartilage. ? Usually do not cause symptoms unless they get very big.  Theca lutein cysts. ? Occur when too much of a certain hormone (human chorionic gonadotropin) is produced and overstimulates the ovaries to produce an egg. ? Are most common after having procedures used to assist with the conception of a baby (in vitro fertilization). What are the causes? Ovarian cysts may be caused by:  Ovarian hyperstimulation syndrome. This is a condition that can develop from taking fertility medicines. It causes multiple large ovarian cysts to form.  Polycystic ovarian syndrome (PCOS). This is a common hormonal disorder that can cause ovarian cysts, as well as problems with your period or fertility. What increases the risk? The following factors may  make you more likely to develop ovarian cysts:  Being overweight or obese.  Taking fertility medicines.  Taking certain forms of hormonal birth control.  Smoking. What are the signs or symptoms? Many ovarian cysts do not cause symptoms. If symptoms are present, they may include:  Pelvic pain or pressure.  Pain in the lower abdomen.  Pain during sex.  Abdominal swelling.  Abnormal menstrual periods.  Increasing pain with menstrual periods. How is this diagnosed? These cysts are commonly found during a routine pelvic exam. You may have tests to find out more about the cyst, such as:  Ultrasound.  X-ray of the pelvis.  CT scan.  MRI.  Blood tests. How is this treated? Many ovarian cysts go away on their own without treatment. Your health care provider may want to check your cyst regularly for 2-3 months to see if it changes. If you are in menopause, it is especially important to have your cyst monitored closely because menopausal women have a higher rate of ovarian cancer. When treatment is needed, it may include:  Medicines to help relieve pain.  A procedure to drain the cyst (aspiration).  Surgery to remove the whole cyst.  Hormone treatment or birth control pills. These methods are sometimes used to help dissolve a cyst. Follow these instructions at home:  Take over-the-counter and prescription medicines only as told by your health care provider.  Do not drive or use heavy machinery while taking prescription pain medicine.  Get regular pelvic exams and Pap tests as often as told by your health care provider.    Return to your normal activities as told by your health care provider. Ask your health care provider what activities are safe for you.  Do not use any products that contain nicotine or tobacco, such as cigarettes and e-cigarettes. If you need help quitting, ask your health care provider.  Keep all follow-up visits as told by your health care provider.  This is important. Contact a health care provider if:  Your periods are late, irregular, or painful, or they stop.  You have pelvic pain that does not go away.  You have pressure on your bladder or trouble emptying your bladder completely.  You have pain during sex.  You have any of the following in your abdomen: ? A feeling of fullness. ? Pressure. ? Discomfort. ? Pain that does not go away. ? Swelling.  You feel generally ill.  You become constipated.  You lose your appetite.  You develop severe acne.  You start to have more body hair and facial hair.  You are gaining weight or losing weight without changing your exercise and eating habits.  You think you may be pregnant. Get help right away if:  You have abdominal pain that is severe or gets worse.  You cannot eat or drink without vomiting.  You suddenly develop a fever.  Your menstrual period is much heavier than usual. This information is not intended to replace advice given to you by your health care provider. Make sure you discuss any questions you have with your health care provider. Document Released: 09/10/2005 Document Revised: 12/09/2017 Document Reviewed: 02/12/2016 Elsevier Patient Education  2020 Elsevier Inc.  

## 2019-03-30 NOTE — Progress Notes (Signed)
GYN ENCOUNTER NOTE  Subjective:       Jane Jackson is a 33 y.o. G59P1011 female is here for gynecologic evaluation of the following issues:  1. Left sided pelvic pain. She states that it was really bad on Thursday but has since gotten a little better. She has a history of ovarian cysts. She also complains of recent weight gain and fatigue. She stopped breastfeeding her baby a while ago and denies that the weight gain is due to BF. She denies changes in diet and activity.      Gynecologic History Patient's last menstrual period was 03/15/2019 (approximate). Contraception: none Last Pap: 2017@ [facility]. Results were: normal  Last mammogram: n/a .  Obstetric History OB History  Gravida Para Term Preterm AB Living  2 1 1   1 1   SAB TAB Ectopic Multiple Live Births  1     0 1    # Outcome Date GA Lbr Len/2nd Weight Sex Delivery Anes PTL Lv  2 Term 03/13/17 [redacted]w[redacted]d 24:30 / 00:47 6 lb 4.5 oz (2.85 kg) M Vag-Spont EPI  LIV  1 SAB             Past Medical History:  Diagnosis Date  . Anxiety   . Asthma   . Chicken pox   . Complication of anesthesia    issue with asthma and intubation  . Cystic dysplasia of one kidney    about 4 months ago-no f/u- largest 11mm?  . Diverticulitis   . Fibroid, uterine    dx a few months ago  . GERD (gastroesophageal reflux disease)   . H/O mitral valve prolapse    as a child  . Impaction, bowel ([place])   . Inappropriate sinus tachycardia    a. exacerbated by pregnancy - 2018; b. 01/2017 Holter: 29% of time in sinus tachycardia;  c. 02/2017 Echo: EF 65-70%, no rwma; c. 01/2017 Holter: 29% of time in sinus tachycardia.  . Kidney stones     Past Surgical History:  Procedure Laterality Date  . APPENDECTOMY    . CHOLECYSTECTOMY N/A 01/18/2016   Procedure: LAPAROSCOPIC CHOLECYSTECTOMY WITH INTRAOPERATIVE CHOLANGIOGRAM;  Surgeon: Excell Seltzer, MD;  Location: [facility];  Service: General;  Laterality: N/A;  . ruptured cyst    . TONSILLECTOMY AND ADENOIDECTOMY      age 27  . TYMPANOSTOMY TUBE PLACEMENT      Current Outpatient Medications on File Prior to Visit  Medication Sig Dispense Refill  . albuterol (ACCUNEB) 1.25 MG/3ML nebulizer solution Take 3 mLs by nebulization 2 (two) times daily as needed. Asthma. 75 mL 3  . albuterol (PROVENTIL HFA;VENTOLIN HFA) 108 (90 Base) MCG/ACT inhaler INHALE 2 PUFFS INTO THE LUNGS EVERY 6 (SIX) HOURS AS NEEDED FOR WHEEZING OR SHORTNESS OF BREATH. 8.5 Inhaler 3  . Metoprolol-Hydrochlorothiazide 50-12.5 MG TB24 metoprolol succ 50 mg-hydrochlorothiazide 12.5 mg tablet,ext.rel 24 hr    . Prenatal Vit-Fe Fumarate-FA (MULTIVITAMIN-PRENATAL) 27-0.8 MG TABS tablet Take 1 tablet by mouth daily at 12 noon.    Marland Kitchen ADVAIR DISKUS 100-50 MCG/DOSE AEPB INHALE 1 PUFF INTO THE LUNGS 2 (TWO) TIMES DAILY. (Patient not taking: Reported on 03/30/2019) 60 each 6  . metoprolol succinate (TOPROL-XL) 25 MG 24 hr tablet Take 1.5 tablets (37.5 mg total) by mouth daily. Take with or immediately following a meal. 135 tablet 3   No current facility-administered medications on file prior to visit.     Allergies  Allergen Reactions  . Latex Itching and Rash    Social History  Socioeconomic History  . Marital status: Married    Spouse name: Not on file  . Number of children: Not on file  . Years of education: Not on file  . Highest education level: Not on file  Occupational History  . Not on file  Social Needs  . Financial resource strain: Not on file  . Food insecurity    Worry: Not on file    Inability: Not on file  . Transportation needs    Medical: Not on file    Non-medical: Not on file  Tobacco Use  . Smoking status: Former Research scientist (life sciences)  . Smokeless tobacco: Never Used  . Tobacco comment: Smokes socially.   Substance and Sexual Activity  . Alcohol use: Yes    Alcohol/week: 0.0 standard drinks    Comment: ocass  . Drug use: No  . Sexual activity: Yes    Partners: Male    Birth control/protection: None  Lifestyle  . Physical  activity    Days per week: Not on file    Minutes per session: Not on file  . Stress: Not on file  Relationships  . Social Herbalist on phone: Not on file    Gets together: Not on file    Attends religious service: Not on file    Active member of club or organization: Not on file    Attends meetings of clubs or organizations: Not on file    Relationship status: Not on file  . Intimate partner violence    Fear of current or ex partner: Not on file    Emotionally abused: Not on file    Physically abused: Not on file    Forced sexual activity: Not on file  Other Topics Concern  . Not on file  Social History Narrative  . Not on file    Family History  Problem Relation Age of Onset  . Arthritis Mother   . Hypertension Mother   . Diabetes Mother   . Cervical cancer Mother   . Alcohol abuse Father   . Arthritis Father   . Hypertension Father   . Arthritis Maternal Grandmother   . Heart disease Maternal Grandmother   . Stroke Maternal Grandmother   . Hypertension Maternal Grandmother   . Diabetes Maternal Grandmother   . Arthritis Maternal Grandfather   . Colon cancer Maternal Grandfather   . Prostate cancer Maternal Grandfather   . Heart disease Maternal Grandfather   . Stroke Maternal Grandfather   . Hypertension Maternal Grandfather   . Arthritis Paternal Grandmother   . Heart disease Paternal Grandmother   . Hypertension Paternal Grandmother   . Diabetes Paternal Grandmother   . Arthritis Paternal Grandfather   . Heart disease Paternal Grandfather   . Hypertension Paternal Grandfather   . Thyroid disease Maternal Aunt        thyroid cancer    The following portions of the patient's history were reviewed and updated as appropriate: allergies, current medications, past family history, past medical history, past social history, past surgical history and problem list.  Review of Systems Review of Systems - Negative except as mentioned in HPI Review of  Systems - General ROS: negative for - chills, fatigue, fever, hot flashes, malaise or night sweats. Positive for weight gain and fatigue.  Hematological and Lymphatic ROS: negative for - bleeding problems or swollen lymph nodes Gastrointestinal ROS: negative for - abdominal pain, blood in stools, change in bowel habits and nausea/vomiting Musculoskeletal ROS: negative for - joint  pain, muscle pain or muscular weakness Genito-Urinary ROS: negative for - change in menstrual cycle, dysmenorrhea, dysuria, genital discharge, genital ulcers, hematuria, incontinence, irregular/heavy menses, nocturia. Positive for  pelvic pain and  dyspareunia,  Objective:   BP 118/74   Pulse 89   Ht 5\' 4"  (1.626 m)   Wt 297 lb 6 oz (134.9 kg)   LMP 03/15/2019 (Approximate)   Breastfeeding No   BMI 51.04 kg/m  CONSTITUTIONAL: Well-developed, obese  female in no acute distress.  HENT:  Normocephalic, atraumatic.  NECK: Normal range of motion, supple, no masses.  Normal thyroid.  SKIN: Skin is warm and dry. No rash noted. Not diaphoretic. No erythema. No pallor. Wright: Alert and oriented to person, place, and time.  PSYCHIATRIC: Normal mood and affect. Normal behavior. Normal judgment and thought content. CARDIOVASCULAR:Not Examined RESPIRATORY: Not Examined BREASTS: Not Examined ABDOMEN: Soft, non distended; Non tender.  No Organomegaly. PELVIC:pt declined  MUSCULOSKELETAL: Normal range of motion. No tenderness.  No cyanosis, clubbing, or edema.   Assessment:   Pelvic pain  Fatigue Weight gain   Plan:   Discussed possible causes of  Pelvic pain including ovarian cysts, fibroids, pelvic congestive syndrome, STD's. She denies possibility of STD.  Reviewed diagnosis and u/s , reviewed treatment options , NSAIDS, birth control , and self help (heating pad/ warm bath)... Pt state she would like to have another baby soon and declines birth control at this time. We discussed options of weight loss  management if labs WNL. Will follow up with results of U/s and labs. Follow up prn.   I attest more than 50% of visit spent reviewing history , discussing possible causes of pelvic pain, diagnosis , and treatment options. All questions answered. Face to face time 15 min.   Philip Aspen, CNM

## 2019-03-31 ENCOUNTER — Telehealth: Payer: Self-pay

## 2019-03-31 LAB — CBC
Hematocrit: 39.1 % (ref 34.0–46.6)
Hemoglobin: 13.3 g/dL (ref 11.1–15.9)
MCH: 30 pg (ref 26.6–33.0)
MCHC: 34 g/dL (ref 31.5–35.7)
MCV: 88 fL (ref 79–97)
Platelets: 306 10*3/uL (ref 150–450)
RBC: 4.44 x10E6/uL (ref 3.77–5.28)
RDW: 12.9 % (ref 11.7–15.4)
WBC: 7.7 10*3/uL (ref 3.4–10.8)

## 2019-03-31 LAB — THYROID PANEL WITH TSH
Free Thyroxine Index: 1.6 (ref 1.2–4.9)
T3 Uptake Ratio: 22 % — ABNORMAL LOW (ref 24–39)
T4, Total: 7.3 ug/dL (ref 4.5–12.0)
TSH: 1.82 u[IU]/mL (ref 0.450–4.500)

## 2019-03-31 NOTE — Telephone Encounter (Signed)
Coronavirus (COVID-19) Are you at risk?  Are you at risk for the Coronavirus (COVID-19)?  To be considered HIGH RISK for Coronavirus (COVID-19), you have to meet the following criteria:  . Traveled to [place], [place], [place], [place] or [place]; or in the [place] to [place], [place], [place], or [place]; and have fever, cough, and shortness of breath within the last 2 weeks of travel OR . Been in close contact with a person diagnosed with COVID-19 within the last 2 weeks and have fever, cough, and shortness of breath . IF YOU DO NOT MEET THESE CRITERIA, YOU ARE CONSIDERED LOW RISK FOR COVID-19.  What to do if you are HIGH RISK for COVID-19?  . If you are having a medical emergency, call 911. . Seek medical care right away. Before you go to a doctor's office, urgent care or emergency department, call ahead and tell them about your recent travel, contact with someone diagnosed with COVID-19, and your symptoms. You should receive instructions from your physician's office regarding next steps of care.  . When you arrive at healthcare provider, tell the healthcare staff immediately you have returned from visiting [place], [place], [place], [place] or [place]; or traveled in the [place] to [place], [place], [place], or [place]; in the last two weeks or you have been in close contact with a person diagnosed with COVID-19 in the last 2 weeks.   . Tell the health care staff about your symptoms: fever, cough and shortness of breath. . After you have been seen by a medical provider, you will be either: o Tested for (COVID-19) and discharged home on quarantine except to seek medical care if symptoms worsen, and asked to  - Stay home and avoid contact with others until you get your results (4-5 days)  - Avoid travel on public transportation if possible (such as bus, train, or airplane) or o Sent to the Emergency Department by EMS for evaluation, COVID-19 testing, and possible  admission depending on your condition and test results.  What to do if you are LOW RISK for COVID-19?  Reduce your risk of any infection by using the same precautions used for avoiding the common cold or flu:  . Wash your hands often with soap and warm water for at least 20 seconds.  If soap and water are not readily available, use an alcohol-based hand sanitizer with at least 60% alcohol.  . If coughing or sneezing, cover your mouth and nose by coughing or sneezing into the elbow areas of your shirt or coat, into a tissue or into your sleeve (not your hands). . Avoid shaking hands with others and consider head nods or verbal greetings only. . Avoid touching your eyes, nose, or mouth with unwashed hands.  . Avoid close contact with people who are sick. . Avoid places or events with large numbers of people in one location, like concerts or sporting events. . Carefully consider travel plans you have or are making. . If you are planning any travel outside or inside the [place], visit the CDC's Travelers' Health webpage for the latest health notices. . If you have some symptoms but not all symptoms, continue to monitor at home and seek medical attention if your symptoms worsen. . If you are having a medical emergency, call 911.   ADDITIONAL HEALTHCARE OPTIONS FOR PATIENTS  [facility] / e-Visit: [email or web address]         [facility]: [phone]  [facility]: [phone]                   [facility]: [phone]   Pre-screen negative, DM.   

## 2019-04-01 ENCOUNTER — Other Ambulatory Visit: Payer: Self-pay

## 2019-04-01 ENCOUNTER — Ambulatory Visit (INDEPENDENT_AMBULATORY_CARE_PROVIDER_SITE_OTHER): Payer: BLUE CROSS/BLUE SHIELD

## 2019-04-01 DIAGNOSIS — N83201 Unspecified ovarian cyst, right side: Secondary | ICD-10-CM | POA: Diagnosis not present

## 2019-04-01 DIAGNOSIS — R102 Pelvic and perineal pain: Secondary | ICD-10-CM | POA: Diagnosis not present

## 2019-04-27 ENCOUNTER — Ambulatory Visit (INDEPENDENT_AMBULATORY_CARE_PROVIDER_SITE_OTHER): Payer: BLUE CROSS/BLUE SHIELD | Admitting: Certified Nurse Midwife

## 2019-04-27 ENCOUNTER — Other Ambulatory Visit: Payer: Self-pay

## 2019-04-27 ENCOUNTER — Telehealth: Payer: Self-pay | Admitting: Cardiovascular Disease

## 2019-04-27 ENCOUNTER — Encounter: Payer: Self-pay | Admitting: Certified Nurse Midwife

## 2019-04-27 VITALS — BP 129/98 | HR 78 | Ht 64.0 in | Wt 300.5 lb

## 2019-04-27 DIAGNOSIS — Z6841 Body Mass Index (BMI) 40.0 and over, adult: Secondary | ICD-10-CM | POA: Diagnosis not present

## 2019-04-27 DIAGNOSIS — E669 Obesity, unspecified: Secondary | ICD-10-CM

## 2019-04-27 MED ORDER — CYANOCOBALAMIN 1000 MCG/ML IJ SOLN
1000.0000 ug | Freq: Once | INTRAMUSCULAR | Status: AC
  Administered 2019-04-27: 1000 ug via INTRAMUSCULAR

## 2019-04-27 MED ORDER — CYANOCOBALAMIN 1000 MCG/ML IJ SOLN: 1000.0000 ug | Freq: Once | INTRAMUSCULAR | 6 refills | Status: AC

## 2019-04-27 MED ORDER — FLUCONAZOLE 150 MG PO TABS: 150.0000 mg | ORAL_TABLET | Freq: Once | ORAL | 1 refills | Status: AC

## 2019-04-27 NOTE — Telephone Encounter (Signed)
Patient calling in regarding a new medication another provider wants to prescribe for weight loss. The medication under question is phentamine to see if it is contradictive with patients beta blocker or if patient would be able to take.Please advise patient.  Patient is okay with a voicemail being left.

## 2019-04-27 NOTE — Progress Notes (Signed)
Subjective:  Unnamed Hino is a 33 y.o. G2P1011 at Unknown being seen today for weight loss management- initial visit.  Patient reports General ROS: negative and reports previous weight loss attempts: Management changes made at the last visit include: exercise 2-3 times a week x 30 min. And diet changed   Onset was gradual over the last several years.  Onset followed: recent pregnancy Associated symptoms include: fatigue, depression, anxiet, abdominal pain,  change in clothing fit and menstrual changes . Previous/Current treatment includes: small frequent feedings, nutritional changes to diet. Vitamin supplement. Will hold on phentamine until cardiology consult ( tachycardia) .    Pertinent medical history includes: asthma, tachycardia   Risk factors include: none  The patient has a surgical history of:  cholecystectomy  Pertinent social history includes: none Past evaluation has included: Comprehensive metabolic profile, hemoglobin A1c ordered today. Thyroid panel-wnl   Past treatment has included dietary changes,   exercise management  The following portions of the patient's history were reviewed and updated as appropriate: allergies, current medications, past family history, past medical history, past social history, past surgical history and problem list.   Objective:   Vitals:   04/27/19 1439  BP: (!) 129/98  Pulse: 78  Weight: (!) 300 lb 8 oz (136.3 kg)  Height: 5\' 4"  (1.626 m)  Repeat BP 94/52   General:  Alert, oriented and cooperative. Patient is in no acute distress.  :   :   :   :   :   :   PE: Well groomed female in no current distress,   Mental Status: Normal mood and affect. Normal behavior. Normal judgment and thought content.   Current BMI: Body mass index is 51.58 kg/m.   Assessment and Plan:  Obesity  Plan: low carb, High protein diet or calorie counting , nutrition consult ordered.cardiology consult for use of adipex due to history of tachycardia.   B12 1064mcg.ml monthly, to start now with first injection given at today's visit. Reviewed side-effects  and expected outcomes. Increase daily water intake to at least 8 bottle a day, every day.  Goal is to reduse weight by 10% by end of three months, and will re-evaluate then.  RTC in 4 weeks for Nurse visit to check weight & BP, and get next B12 injections.    Please refer to After Visit Summary for other counseling recommendations.    Philip Aspen, CNM  I attest more than 50% of this visit spent reviewing history, discussing use of medications to assist with weight loss, dicussed diet and exercise, develop a plan of care, answer all her questions. Face to face time 15 min.    Consider the Low Glycemic Index Diet and 6 smaller meals daily .  This boosts your metabolism and regulates your sugars:   Use the protein bar by [employer]kins because they have lots of fiber in them  Find the low carb flatbreads, tortillas and pita breads for sandwiches:  Joseph's makes a pita bread and a flat bread , available at [facility] and [employer]; [place] makes a low carb flatbread available at [employer] and HT that is 9 net carbs and 100 cal [place] makes a low carb whole wheat tortilla available at [employer] most grocery stores with 6 net carbs and 210 cal  [place] yogurt can still have a lot of carbs .  Dannon Light N fit has 80 cal and 8 carbs

## 2019-04-27 NOTE — Telephone Encounter (Signed)
She can try phentermine.  If tachycardia worsens, we might have to stop it.

## 2019-04-27 NOTE — Telephone Encounter (Signed)
Pt reports PCP is wanting to prescribe phentermine for weight loss. Pt wants permission from primary cardiologist before starting d/t hx of tachycardia.   Routed to provider to advise.

## 2019-04-27 NOTE — Patient Instructions (Signed)
Exercising to Lose Weight Exercise is structured, repetitive physical activity to improve fitness and health. Getting regular exercise is important for everyone. It is especially important if you are overweight. Being overweight increases your risk of heart disease, stroke, diabetes, high blood pressure, and several types of cancer. Reducing your calorie intake and exercising can help you lose weight. Exercise is usually categorized as moderate or vigorous intensity. To lose weight, most people need to do a certain amount of moderate-intensity or vigorous-intensity exercise each week. Moderate-intensity exercise  Moderate-intensity exercise is any activity that gets you moving enough to burn at least three times more energy (calories) than if you were sitting. Examples of moderate exercise include:  Walking a mile in 15 minutes.  Doing light yard work.  Biking at an easy pace. Most people should get at least 150 minutes (2 hours and 30 minutes) a week of moderate-intensity exercise to maintain their body weight. Vigorous-intensity exercise Vigorous-intensity exercise is any activity that gets you moving enough to burn at least six times more calories than if you were sitting. When you exercise at this intensity, you should be working hard enough that you are not able to carry on a conversation. Examples of vigorous exercise include:  Running.  Playing a team sport, such as football, basketball, and soccer.  Jumping rope. Most people should get at least 75 minutes (1 hour and 15 minutes) a week of vigorous-intensity exercise to maintain their body weight. How can exercise affect me? When you exercise enough to burn more calories than you eat, you lose weight. Exercise also reduces body fat and builds muscle. The more muscle you have, the more calories you burn. Exercise also:  Improves mood.  Reduces stress and tension.  Improves your overall fitness, flexibility, and endurance.   Increases bone strength. The amount of exercise you need to lose weight depends on:  Your age.  The type of exercise.  Any health conditions you have.  Your overall physical ability. Talk to your health care provider about how much exercise you need and what types of activities are safe for you. What actions can I take to lose weight? Nutrition   Make changes to your diet as told by your health care provider or diet and nutrition specialist (dietitian). This may include: ? Eating fewer calories. ? Eating more protein. ? Eating less unhealthy fats. ? Eating a diet that includes fresh fruits and vegetables, whole grains, low-fat dairy products, and lean protein. ? Avoiding foods with added fat, salt, and sugar.  Drink plenty of water while you exercise to prevent dehydration or heat stroke. Activity  Choose an activity that you enjoy and set realistic goals. Your health care provider can help you make an exercise plan that works for you.  Exercise at a moderate or vigorous intensity most days of the week. ? The intensity of exercise may vary from person to person. You can tell how intense a workout is for you by paying attention to your breathing and heartbeat. Most people will notice their breathing and heartbeat get faster with more intense exercise.  Do resistance training twice each week, such as: ? Push-ups. ? Sit-ups. ? Lifting weights. ? Using resistance bands.  Getting short amounts of exercise can be just as helpful as long structured periods of exercise. If you have trouble finding time to exercise, try to include exercise in your daily routine. ? Get up, stretch, and walk around every 30 minutes throughout the day. ? Go for a   walk during your lunch break. ? Park your car farther away from your destination. ? If you take public transportation, get off one stop early and walk the rest of the way. ? Make phone calls while standing up and walking around. ? Take the  stairs instead of elevators or escalators.  Wear comfortable clothes and shoes with good support.  Do not exercise so much that you hurt yourself, feel dizzy, or get very short of breath. Where to find more information  [place] Department of Health and Human Services: [employer]  Centers for Disease Control and Prevention (CDC): http://www.wolf.info/ Contact a health care provider:  Before starting a new exercise program.  If you have questions or concerns about your weight.  If you have a medical problem that keeps you from exercising. Get help right away if you have any of the following while exercising:  Injury.  Dizziness.  Difficulty breathing or shortness of breath that does not go away when you stop exercising.  Chest pain.  Rapid heartbeat. Summary  Being overweight increases your risk of heart disease, stroke, diabetes, high blood pressure, and several types of cancer.  Losing weight happens when you burn more calories than you eat.  Reducing the amount of calories you eat in addition to getting regular moderate or vigorous exercise each week helps you lose weight. This information is not intended to replace advice given to you by your health care provider. Make sure you discuss any questions you have with your health care provider. Document Released: 10/13/2010 Document Revised: 09/23/2017 Document Reviewed: 09/23/2017 Elsevier Patient Education  2020 [employer] Calories are units of energy. Your body needs a certain amount of calories from food to keep you going throughout the day. When you eat more calories than your body needs, your body stores the extra calories as fat. When you eat fewer calories than your body needs, your body burns fat to get the energy it needs. Calorie counting means keeping track of how many calories you eat and drink each day. Calorie counting can be helpful if you need to lose weight. If you make sure to eat fewer  calories than your body needs, you should lose weight. Ask your health care provider what a healthy weight is for you. For calorie counting to work, you will need to eat the right number of calories in a day in order to lose a healthy amount of weight per week. A dietitian can help you determine how many calories you need in a day and will give you suggestions on how to reach your calorie goal.  A healthy amount of weight to lose per week is usually 1-2 lb (0.5-0.9 kg). This usually means that your daily calorie intake should be reduced by 500-750 calories.  Eating 1,200 - 1,500 calories per day can help most women lose weight.  Eating 1,500 - 1,800 calories per day can help most men lose weight. What is my plan? My goal is to have __________ calories per day. If I have this many calories per day, I should lose around __________ pounds per week. What do I need to know about calorie counting? In order to meet your daily calorie goal, you will need to:  Find out how many calories are in each food you would like to eat. Try to do this before you eat.  Decide how much of the food you plan to eat.  Write down what you ate and how many calories it  had. Doing this is called keeping a food log. To successfully lose weight, it is important to balance calorie counting with a healthy lifestyle that includes regular activity. Aim for 150 minutes of moderate exercise (such as walking) or 75 minutes of vigorous exercise (such as running) each week. Where do I find calorie information?  The number of calories in a food can be found on a Nutrition Facts label. If a food does not have a Nutrition Facts label, try to look up the calories online or ask your dietitian for help. Remember that calories are listed per serving. If you choose to have more than one serving of a food, you will have to multiply the calories per serving by the amount of servings you plan to eat. For example, the label on a package of  bread might say that a serving size is 1 slice and that there are 90 calories in a serving. If you eat 1 slice, you will have eaten 90 calories. If you eat 2 slices, you will have eaten 180 calories. How do I keep a food log? Immediately after each meal, record the following information in your food log:  What you ate. Don't forget to include toppings, sauces, and other extras on the food.  How much you ate. This can be measured in cups, ounces, or number of items.  How many calories each food and drink had.  The total number of calories in the meal. Keep your food log near you, such as in a small notebook in your pocket, or use a mobile app or website. Some programs will calculate calories for you and show you how many calories you have left for the day to meet your goal. What are some calorie counting tips?   Use your calories on foods and drinks that will fill you up and not leave you hungry: ? Some examples of foods that fill you up are nuts and nut butters, vegetables, lean proteins, and high-fiber foods like whole grains. High-fiber foods are foods with more than 5 g fiber per serving. ? Drinks such as sodas, specialty coffee drinks, alcohol, and juices have a lot of calories, yet do not fill you up.  Eat nutritious foods and avoid empty calories. Empty calories are calories you get from foods or beverages that do not have many vitamins or protein, such as candy, sweets, and soda. It is better to have a nutritious high-calorie food (such as an avocado) than a food with few nutrients (such as a bag of chips).  Know how many calories are in the foods you eat most often. This will help you calculate calorie counts faster.  Pay attention to calories in drinks. Low-calorie drinks include water and unsweetened drinks.  Pay attention to nutrition labels for "low fat" or "fat free" foods. These foods sometimes have the same amount of calories or more calories than the full fat versions. They  also often have added sugar, starch, or salt, to make up for flavor that was removed with the fat.  Find a way of tracking calories that works for you. Get creative. Try different apps or programs if writing down calories does not work for you. What are some portion control tips?  Know how many calories are in a serving. This will help you know how many servings of a certain food you can have.  Use a measuring cup to measure serving sizes. You could also try weighing out portions on a kitchen scale. With time, you  will be able to estimate serving sizes for some foods.  Take some time to put servings of different foods on your favorite plates, bowls, and cups so you know what a serving looks like.  Try not to eat straight from a bag or box. Doing this can lead to overeating. Put the amount you would like to eat in a cup or on a plate to make sure you are eating the right portion.  Use smaller plates, glasses, and bowls to prevent overeating.  Try not to multitask (for example, watch TV or use your computer) while eating. If it is time to eat, sit down at a table and enjoy your food. This will help you to know when you are full. It will also help you to be aware of what you are eating and how much you are eating. What are tips for following this plan? Reading food labels  Check the calorie count compared to the serving size. The serving size may be smaller than what you are used to eating.  Check the source of the calories. Make sure the food you are eating is high in vitamins and protein and low in saturated and trans fats. Shopping  Read nutrition labels while you shop. This will help you make healthy decisions before you decide to purchase your food.  Make a grocery list and stick to it. Cooking  Try to cook your favorite foods in a healthier way. For example, try baking instead of frying.  Use low-fat dairy products. Meal planning  Use more fruits and vegetables. Half of your  plate should be fruits and vegetables.  Include lean proteins like poultry and fish. How do I count calories when eating out?  Ask for smaller portion sizes.  Consider sharing an entree and sides instead of getting your own entree.  If you get your own entree, eat only half. Ask for a box at the beginning of your meal and put the rest of your entree in it so you are not tempted to eat it.  If calories are listed on the menu, choose the lower calorie options.  Choose dishes that include vegetables, fruits, whole grains, low-fat dairy products, and lean protein.  Choose items that are boiled, broiled, grilled, or steamed. Stay away from items that are buttered, battered, fried, or served with cream sauce. Items labeled "crispy" are usually fried, unless stated otherwise.  Choose water, low-fat milk, unsweetened iced tea, or other drinks without added sugar. If you want an alcoholic beverage, choose a lower calorie option such as a glass of wine or light beer.  Ask for dressings, sauces, and syrups on the side. These are usually high in calories, so you should limit the amount you eat.  If you want a salad, choose a garden salad and ask for grilled meats. Avoid extra toppings like bacon, cheese, or fried items. Ask for the dressing on the side, or ask for olive oil and vinegar or lemon to use as dressing.  Estimate how many servings of a food you are given. For example, a serving of cooked rice is  cup or about the size of half a baseball. Knowing serving sizes will help you be aware of how much food you are eating at restaurants. The list below tells you how big or small some common portion sizes are based on everyday objects: ? 1 oz-4 stacked dice. ? 3 oz-1 deck of cards. ? 1 tsp-1 die. ? 1 Tbsp- a ping-pong ball. ? 2  Tbsp-1 ping-pong ball. ?  cup- baseball. ? 1 cup-1 baseball. Summary  Calorie counting means keeping track of how many calories you eat and drink each day. If you  eat fewer calories than your body needs, you should lose weight.  A healthy amount of weight to lose per week is usually 1-2 lb (0.5-0.9 kg). This usually means reducing your daily calorie intake by 500-750 calories.  The number of calories in a food can be found on a Nutrition Facts label. If a food does not have a Nutrition Facts label, try to look up the calories online or ask your dietitian for help.  Use your calories on foods and drinks that will fill you up, and not on foods and drinks that will leave you hungry.  Use smaller plates, glasses, and bowls to prevent overeating. This information is not intended to replace advice given to you by your health care provider. Make sure you discuss any questions you have with your health care provider. Document Released: 09/10/2005 Document Revised: 05/30/2018 Document Reviewed: 08/10/2016 Elsevier Patient Education  2020 [employer].

## 2019-04-28 ENCOUNTER — Telehealth: Payer: Self-pay | Admitting: Obstetrics and Gynecology

## 2019-04-28 LAB — COMPREHENSIVE METABOLIC PANEL
ALT: 61 IU/L — ABNORMAL HIGH (ref 0–32)
AST: 28 IU/L (ref 0–40)
Albumin/Globulin Ratio: 1.6 (ref 1.2–2.2)
Albumin: 4.2 g/dL (ref 3.8–4.8)
Alkaline Phosphatase: 70 IU/L (ref 39–117)
BUN/Creatinine Ratio: 15 (ref 9–23)
BUN: 13 mg/dL (ref 6–20)
Bilirubin Total: <0.2 mg/dL (ref 0.0–1.2)
CO2: 22 mmol/L (ref 20–29)
Calcium: 9.1 mg/dL (ref 8.7–10.2)
Chloride: 102 mmol/L (ref 96–106)
Creatinine, Ser: 0.87 mg/dL (ref 0.57–1.00)
GFR calc Af Amer: 100 mL/min/{1.73_m2} (ref >59)
GFR calc non Af Amer: 87 mL/min/{1.73_m2} (ref >59)
Globulin, Total: 2.6 g/dL (ref 1.5–4.5)
Glucose: 89 mg/dL (ref 65–99)
Potassium: 4.8 mmol/L (ref 3.5–5.2)
Sodium: 140 mmol/L (ref 134–144)
Total Protein: 6.8 g/dL (ref 6.0–8.5)

## 2019-04-28 LAB — HEMOGLOBIN A1C
Est. average glucose Bld gHb Est-mCnc: 114 mg/dL
Hgb A1c MFr Bld: 5.6 % (ref 4.8–5.6)

## 2019-04-28 MED ORDER — METOPROLOL SUCCINATE ER 25 MG PO TB24: 37.5000 mg | ORAL_TABLET | Freq: Every day | ORAL | 3 refills | Status: DC

## 2019-04-28 NOTE — Telephone Encounter (Signed)
Returned call to patient with advice from Dr. Fletcher Anon.   Pt okay to start phentermine. Keep a monitor of HR and BP. If tachycardia worsens stop medication and return call to clinic.   Pt verbalized understanding. After chart review, pt is past due on 6 month follow up. Appt made at the patients request for 8/22 with Ignacia Bayley, NP.   Pt also requested refill of metoprolol. Rx sent to pt preferred pharmacy.   Advised pt to call for any further questions or concerns.

## 2019-04-28 NOTE — Telephone Encounter (Signed)
Patient called stating she received clearance from her cardiologist to take phentermine.

## 2019-04-29 ENCOUNTER — Other Ambulatory Visit: Payer: Self-pay | Admitting: Certified Nurse Midwife

## 2019-04-29 MED ORDER — PHENTERMINE HCL 37.5 MG PO CAPS: 37.5000 mg | ORAL_CAPSULE | ORAL | 0 refills | Status: DC

## 2019-04-29 NOTE — Progress Notes (Signed)
Pt received clearance from cardiology to start phentramine for the weight loss program. Orders placed.   Philip Aspen, CNM

## 2019-04-29 NOTE — Telephone Encounter (Signed)
It looks like this is Annie's patient

## 2019-05-01 MED ORDER — PHENTERMINE HCL 37.5 MG PO TABS: 37.5000 mg | ORAL_TABLET | Freq: Every day | ORAL | 0 refills | Status: DC

## 2019-05-05 ENCOUNTER — Telehealth: Payer: Self-pay | Admitting: Certified Nurse Midwife

## 2019-05-05 NOTE — Telephone Encounter (Signed)
Pt called and stated that she prefers not to wait until tomorrow to have u/s pt is requesting to have u/s at another facility today if possible. I informed pt that a message will be sent back. Pt is scheduled for tomorrow for now. Pt requesting call back to further discuss concern. Please advise.

## 2019-05-05 NOTE — Telephone Encounter (Signed)
The patient called and stated that she needs to speak with Deneise Lever or Ivin Booty as soon as possible. The patient stated that she has had a cyst rupture and it feels that she is experiencing the "worst menstrual cycle of her life" without bleeding. The patient wanting to have an ultrasound as soon as possible. Please advise.

## 2019-05-06 ENCOUNTER — Other Ambulatory Visit: Payer: BLUE CROSS/BLUE SHIELD

## 2019-05-06 ENCOUNTER — Ambulatory Visit (INDEPENDENT_AMBULATORY_CARE_PROVIDER_SITE_OTHER): Payer: BLUE CROSS/BLUE SHIELD

## 2019-05-06 ENCOUNTER — Other Ambulatory Visit: Payer: Self-pay

## 2019-05-06 ENCOUNTER — Other Ambulatory Visit: Payer: Self-pay | Admitting: Obstetrics and Gynecology

## 2019-05-06 ENCOUNTER — Other Ambulatory Visit (INDEPENDENT_AMBULATORY_CARE_PROVIDER_SITE_OTHER): Payer: BLUE CROSS/BLUE SHIELD

## 2019-05-06 DIAGNOSIS — R319 Hematuria, unspecified: Secondary | ICD-10-CM | POA: Diagnosis not present

## 2019-05-06 DIAGNOSIS — R52 Pain, unspecified: Secondary | ICD-10-CM

## 2019-05-06 DIAGNOSIS — R102 Pelvic and perineal pain: Secondary | ICD-10-CM

## 2019-05-06 LAB — POCT URINALYSIS DIPSTICK
Bilirubin, UA: NEGATIVE
Glucose, UA: NEGATIVE
Leukocytes, UA: NEGATIVE
Nitrite, UA: NEGATIVE
Protein, UA: POSITIVE — AB
Spec Grav, UA: >=1.03 — AB
Urobilinogen, UA: 0.2 U/dL
pH, UA: 6

## 2019-05-08 LAB — URINE CULTURE

## 2019-06-16 ENCOUNTER — Ambulatory Visit: Payer: BLUE CROSS/BLUE SHIELD | Admitting: Nurse Practitioner

## 2019-06-17 ENCOUNTER — Ambulatory Visit: Payer: BLUE CROSS/BLUE SHIELD | Admitting: Dietician

## 2019-07-07 ENCOUNTER — Ambulatory Visit: Payer: BLUE CROSS/BLUE SHIELD | Admitting: Nurse Practitioner

## 2019-07-16 ENCOUNTER — Ambulatory Visit: Payer: BLUE CROSS/BLUE SHIELD | Admitting: Dietician

## 2019-08-12 ENCOUNTER — Ambulatory Visit: Payer: BLUE CROSS/BLUE SHIELD | Admitting: Nurse Practitioner

## 2019-08-12 NOTE — Progress Notes (Deleted)
Office Visit    Patient Name: Jane Jackson Date of Encounter: 08/12/2019  Primary Care Provider:  Coral Spikes, DO Primary Cardiologist:  Kathlyn Sacramento, MD  Chief Complaint    33 year old female with a history of inappropriate sinus tachycardia, morbid obesity, nephrolithiasis, and GERD, who presents for follow-up related to palpitations and tachycardia.  Past Medical History    Past Medical History:  Diagnosis Date  . Anxiety   . Asthma   . Chicken pox   . Complication of anesthesia    issue with asthma and intubation  . Cystic dysplasia of one kidney    about 4 months ago-no f/u- largest 2mm?  . Diverticulitis   . Fibroid, uterine    dx a few months ago  . GERD (gastroesophageal reflux disease)   . H/O mitral valve prolapse    as a child  . Impaction, bowel ([place])   . Inappropriate sinus tachycardia    a. exacerbated by pregnancy - 2018; b. 01/2017 Holter: 29% of time in sinus tachycardia;  c. 02/2017 Echo: EF 65-70%, no rwma; c. 01/2017 Holter: 29% of time in sinus tachycardia.  . Kidney stones    Past Surgical History:  Procedure Laterality Date  . APPENDECTOMY    . CHOLECYSTECTOMY N/A 01/18/2016   Procedure: LAPAROSCOPIC CHOLECYSTECTOMY WITH INTRAOPERATIVE CHOLANGIOGRAM;  Surgeon: Excell Seltzer, MD;  Location: [facility];  Service: General;  Laterality: N/A;  . ruptured cyst    . TONSILLECTOMY AND ADENOIDECTOMY     age 82  . TYMPANOSTOMY TUBE PLACEMENT      Allergies  Allergies  Allergen Reactions  . Latex Itching and Rash    History of Present Illness    33 year old female with a history of inappropriate sinus tachycardia, morbid obesity, nephrolithiasis, and GERD.  She was previously diagnosed with inappropriate sinus tachycardia in 2018, during her pregnancy.  She wore Holter monitor at that time showing sinus tachycardia for 29% of the time that the monitor was worn.  Echocardiogram showed normal LV function without regional wall motion  abnormalities.  Symptoms improved slightly on Toprol-XL 25 mg daily and more so after delivery.  At one point after delivery, she was weaned off of beta-blocker therapy but then again noted recurrent episodes of tachycardia at random times.  Toprol-XL was resumed.  At her last visit in October 2019, she noted more frequent episodes of tachycardia without other associated symptoms.  We increased Toprol-XL to 37.5 mg daily.  Over the summer, she was placed on phentermine for weight loss and advised to keep an eye on her heart rate.  Home Medications    Prior to Admission medications   Medication Sig Start Date End Date Taking? Authorizing Provider  ADVAIR DISKUS 100-50 MCG/DOSE AEPB INHALE 1 PUFF INTO THE LUNGS 2 (TWO) TIMES DAILY. Patient not taking: Reported on 03/30/2019 05/18/17   Rubie Maid, MD  albuterol (ACCUNEB) 1.25 MG/3ML nebulizer solution Take 3 mLs by nebulization 2 (two) times daily as needed. Asthma. 01/03/17   Rubie Maid, MD  albuterol (PROVENTIL HFA;VENTOLIN HFA) 108 (90 Base) MCG/ACT inhaler INHALE 2 PUFFS INTO THE LUNGS EVERY 6 (SIX) HOURS AS NEEDED FOR WHEEZING OR SHORTNESS OF BREATH. 05/20/17   Rubie Maid, MD  metoprolol succinate (TOPROL-XL) 25 MG 24 hr tablet Take 1.5 tablets (37.5 mg total) by mouth daily. Take with or immediately following a meal. 04/28/19 07/27/19  Wellington Hampshire, MD  Metoprolol-Hydrochlorothiazide 50-12.5 MG TB24 metoprolol succ 50 mg-hydrochlorothiazide 12.5 mg tablet,ext.rel 24 hr  [provider]  phentermine (ADIPEX-P) 37.5 MG tablet Take 1 tablet (37.5 mg total) by mouth daily before breakfast. 05/01/19   Philip Aspen, CNM  Prenatal Vit-Fe Fumarate-FA (MULTIVITAMIN-PRENATAL) 27-0.8 MG TABS tablet Take 1 tablet by mouth daily at 12 noon.    [provider]    Review of Systems    ***.  All other systems reviewed and are otherwise negative except as noted above.  Physical Exam    VS:  There were no vitals taken for this  visit. , BMI There is no height or weight on file to calculate BMI. GEN: Well nourished, well developed, in no acute distress. HEENT: normal. Neck: Supple, no JVD, carotid bruits, or masses. Cardiac: RRR, no murmurs, rubs, or gallops. No clubbing, cyanosis, edema.  Radials/DP/PT 2+ and equal bilaterally.  Respiratory:  Respirations regular and unlabored, clear to auscultation bilaterally. GI: Soft, nontender, nondistended, BS + x 4. MS: no deformity or atrophy. Skin: warm and dry, no rash. Neuro:  Strength and sensation are intact. Psych: Normal affect.  Accessory Clinical Findings    ECG personally reviewed by me today - *** - no acute changes.  Lab Results  Component Value Date   WBC 7.7 03/30/2019   HGB 13.3 03/30/2019   HCT 39.1 03/30/2019   MCV 88 03/30/2019   PLT 306 03/30/2019   Lab Results  Component Value Date   CREATININE 0.87 04/27/2019   BUN 13 04/27/2019   NA 140 04/27/2019   K 4.8 04/27/2019   CL 102 04/27/2019   CO2 22 04/27/2019   Lab Results  Component Value Date   ALT 61 (H) 04/27/2019   AST 28 04/27/2019   ALKPHOS 70 04/27/2019   BILITOT <0.2 04/27/2019   No results found for: CHOL, HDL, LDLCALC, LDLDIRECT, TRIG, CHOLHDL   Assessment & Plan    1.  ***   Murray Hodgkins, NP 08/12/2019, 2:35 PM

## 2019-11-17 ENCOUNTER — Telehealth: Payer: Self-pay | Admitting: Cardiovascular Disease

## 2019-11-17 NOTE — Telephone Encounter (Signed)
3 attempts to schedule fu appt from recall list.   Deleting recall.   

## 2020-02-06 ENCOUNTER — Emergency Department: Payer: 59

## 2020-02-06 ENCOUNTER — Encounter: Payer: Self-pay | Admitting: Emergency Medicine

## 2020-02-06 ENCOUNTER — Emergency Department
Admission: EM | Admit: 2020-02-06 | Discharge: 2020-02-07 | Disposition: A | Discharge status: Home / Self Care | Payer: 59 | Attending: Emergency Medicine | Admitting: Emergency Medicine

## 2020-02-06 ENCOUNTER — Other Ambulatory Visit: Payer: Self-pay

## 2020-02-06 DIAGNOSIS — Y9301 Activity, walking, marching and hiking: Secondary | ICD-10-CM | POA: Insufficient documentation

## 2020-02-06 DIAGNOSIS — J45909 Unspecified asthma, uncomplicated: Secondary | ICD-10-CM | POA: Diagnosis not present

## 2020-02-06 DIAGNOSIS — Z79899 Other long term (current) drug therapy: Secondary | ICD-10-CM | POA: Diagnosis not present

## 2020-02-06 DIAGNOSIS — S99911A Unspecified injury of right ankle, initial encounter: Secondary | ICD-10-CM | POA: Diagnosis present

## 2020-02-06 DIAGNOSIS — X509XXA Other and unspecified overexertion or strenuous movements or postures, initial encounter: Secondary | ICD-10-CM | POA: Insufficient documentation

## 2020-02-06 DIAGNOSIS — S82831A Other fracture of upper and lower end of right fibula, initial encounter for closed fracture: Secondary | ICD-10-CM | POA: Insufficient documentation

## 2020-02-06 DIAGNOSIS — Y999 Unspecified external cause status: Secondary | ICD-10-CM | POA: Insufficient documentation

## 2020-02-06 DIAGNOSIS — Y929 Unspecified place or not applicable: Secondary | ICD-10-CM | POA: Diagnosis not present

## 2020-02-06 DIAGNOSIS — Z91040 Latex allergy status: Secondary | ICD-10-CM | POA: Insufficient documentation

## 2020-02-06 DIAGNOSIS — M25571 Pain in right ankle and joints of right foot: Secondary | ICD-10-CM

## 2020-02-06 DIAGNOSIS — Z87891 Personal history of nicotine dependence: Secondary | ICD-10-CM | POA: Diagnosis not present

## 2020-02-06 MED ORDER — HYDROCODONE-ACETAMINOPHEN 5-325 MG PO TABS
1.0000 | ORAL_TABLET | Freq: Once | ORAL | Status: AC
  Administered 2020-02-06: 1 via ORAL
  Filled 2020-02-06: qty 1

## 2020-02-06 MED ORDER — KETOROLAC TROMETHAMINE 30 MG/ML IJ SOLN
30.0000 mg | Freq: Once | INTRAMUSCULAR | Status: AC
  Administered 2020-02-06: 30 mg via INTRAMUSCULAR
  Filled 2020-02-06: qty 1

## 2020-02-06 MED ORDER — ONDANSETRON 4 MG PO TBDP
4.0000 mg | ORAL_TABLET | Freq: Once | ORAL | Status: AC
  Administered 2020-02-06: 4 mg via ORAL
  Filled 2020-02-06: qty 1

## 2020-02-06 MED ORDER — ONDANSETRON 4 MG PO TBDP: 4.0000 mg | ORAL_TABLET | Freq: Three times a day (TID) | ORAL | 0 refills | Status: AC | PRN

## 2020-02-06 MED ORDER — OXYCODONE-ACETAMINOPHEN 5-325 MG PO TABS: 1.0000 | ORAL_TABLET | Freq: Four times a day (QID) | ORAL | 0 refills | Status: AC | PRN

## 2020-02-06 MED ORDER — OXYCODONE-ACETAMINOPHEN 5-325 MG PO TABS
1.0000 | ORAL_TABLET | Freq: Once | ORAL | Status: AC
  Administered 2020-02-06: 1 via ORAL
  Filled 2020-02-06: qty 1

## 2020-02-06 NOTE — ED Notes (Signed)
Ice pack applied to right ankle.

## 2020-02-06 NOTE — Discharge Instructions (Signed)
Keep ankle elevated at home. Please make follow-up appointment with orthopedics. Percocet can be taken for pain.  Please pair Percocet with Zofran to prevent nausea.

## 2020-02-06 NOTE — ED Triage Notes (Signed)
Pt from home brought in by [facility] for a fall. Pt st she was walking across the yard attempting to grab a ball (st her yard is uneven) when she rolled her ankle. Pt reports feeling a snap and her husband st he heard a "pop".   Obvious deformity noted to the right ankle (good pedal pulses noted (+2). Pt denies LOC or any injuries to head/neck/back   Last meal was at 4pm. Last drink was at 7pm/. Only pmh is HTN and asthma where she takes metoprolol and albuterol PRN  (because it drops her pressure)

## 2020-02-06 NOTE — ED Provider Notes (Signed)
Emergency Department Provider Note  ____________________________________________  Time seen: Approximately 10:01 PM  I have reviewed the triage vital signs and the nursing notes.   HISTORY  Chief Complaint Fall   Historian Patient    HPI Jane Jackson is a 34 y.o. female presents to the emergency department after patient had a mechanical fall.  Patient states that she rolled her right ankle while walking to get a soccer ball.  Patient has not been able to bear weight since injury occurred.  No numbness or tingling of the right leg.  She has been able to move her toes since injury occurred.  No similar injuries in the past.    Past Medical History:  Diagnosis Date  . Anxiety   . Asthma   . Chicken pox   . Complication of anesthesia    issue with asthma and intubation  . Cystic dysplasia of one kidney    about 4 months ago-no f/u- largest 75mm?  . Diverticulitis   . Fibroid, uterine    dx a few months ago  . GERD (gastroesophageal reflux disease)   . H/O mitral valve prolapse    as a child  . Impaction, bowel ([place])   . Inappropriate sinus tachycardia    a. exacerbated by pregnancy - 2018; b. 01/2017 Holter: 29% of time in sinus tachycardia;  c. 02/2017 Echo: EF 65-70%, no rwma; c. 01/2017 Holter: 29% of time in sinus tachycardia.  . Kidney stones      Immunizations up to date:  Yes.     Past Medical History:  Diagnosis Date  . Anxiety   . Asthma   . Chicken pox   . Complication of anesthesia    issue with asthma and intubation  . Cystic dysplasia of one kidney    about 4 months ago-no f/u- largest 74mm?  . Diverticulitis   . Fibroid, uterine    dx a few months ago  . GERD (gastroesophageal reflux disease)   . H/O mitral valve prolapse    as a child  . Impaction, bowel ([place])   . Inappropriate sinus tachycardia    a. exacerbated by pregnancy - 2018; b. 01/2017 Holter: 29% of time in sinus tachycardia;  c. 02/2017 Echo: EF 65-70%, no rwma; c. 01/2017 Holter: 29%  of time in sinus tachycardia.  . Kidney stones     Patient Active Problem List   Diagnosis Date Noted  . Mastitis 03/18/2017  . Labor and delivery, indication for care 03/12/2017  . Pregnancy 02/23/2017  . Poor weight gain of pregnancy, second trimester 01/11/2017  . Abnormal first trimester screen   . Family history of cystic fibrosis   . Family history of mental retardation   . Moderate persistent asthma without complication [id number]  . Nausea and vomiting during pregnancy 09/16/2016  . History of mitral valve prolapse 09/16/2016  . Morbid obesity with BMI of 40.0-44.9, adult ([place]) 01/14/2016    Past Surgical History:  Procedure Laterality Date  . APPENDECTOMY    . CHOLECYSTECTOMY N/A 01/18/2016   Procedure: LAPAROSCOPIC CHOLECYSTECTOMY WITH INTRAOPERATIVE CHOLANGIOGRAM;  Surgeon: Excell Seltzer, MD;  Location: [facility];  Service: General;  Laterality: N/A;  . ruptured cyst    . TONSILLECTOMY AND ADENOIDECTOMY     age 19  . TYMPANOSTOMY TUBE PLACEMENT      Prior to Admission medications   Medication Sig Start Date End Date Taking? Authorizing Provider  ADVAIR DISKUS 100-50 MCG/DOSE AEPB INHALE 1 PUFF INTO THE LUNGS 2 (TWO) TIMES DAILY. Patient  not taking: Reported on 03/30/2019 05/18/17   Rubie Maid, MD  albuterol (ACCUNEB) 1.25 MG/3ML nebulizer solution Take 3 mLs by nebulization 2 (two) times daily as needed. Asthma. 01/03/17   Rubie Maid, MD  albuterol (PROVENTIL HFA;VENTOLIN HFA) 108 (90 Base) MCG/ACT inhaler INHALE 2 PUFFS INTO THE LUNGS EVERY 6 (SIX) HOURS AS NEEDED FOR WHEEZING OR SHORTNESS OF BREATH. 05/20/17   Rubie Maid, MD  metoprolol succinate (TOPROL-XL) 25 MG 24 hr tablet Take 1.5 tablets (37.5 mg total) by mouth daily. Take with or immediately following a meal. 04/28/19 07/27/19  Wellington Hampshire, MD  Metoprolol-Hydrochlorothiazide 50-12.5 MG TB24 metoprolol succ 50 mg-hydrochlorothiazide 12.5 mg tablet,ext.rel 24 hr    [provider]  phentermine  (ADIPEX-P) 37.5 MG tablet Take 1 tablet (37.5 mg total) by mouth daily before breakfast. 05/01/19   Philip Aspen, CNM  Prenatal Vit-Fe Fumarate-FA (MULTIVITAMIN-PRENATAL) 27-0.8 MG TABS tablet Take 1 tablet by mouth daily at 12 noon.    [provider]    Allergies Latex  Family History  Problem Relation Age of Onset  . Arthritis Mother   . Hypertension Mother   . Diabetes Mother   . Cervical cancer Mother   . Alcohol abuse Father   . Arthritis Father   . Hypertension Father   . Arthritis Maternal Grandmother   . Heart disease Maternal Grandmother   . Stroke Maternal Grandmother   . Hypertension Maternal Grandmother   . Diabetes Maternal Grandmother   . Arthritis Maternal Grandfather   . Colon cancer Maternal Grandfather   . Prostate cancer Maternal Grandfather   . Heart disease Maternal Grandfather   . Stroke Maternal Grandfather   . Hypertension Maternal Grandfather   . Arthritis Paternal Grandmother   . Heart disease Paternal Grandmother   . Hypertension Paternal Grandmother   . Diabetes Paternal Grandmother   . Arthritis Paternal Grandfather   . Heart disease Paternal Grandfather   . Hypertension Paternal Grandfather   . Thyroid disease Maternal Aunt        thyroid cancer    Social History Social History   Tobacco Use  . Smoking status: Former Research scientist (life sciences)  . Smokeless tobacco: Never Used  . Tobacco comment: Smokes socially.   Substance Use Topics  . Alcohol use: Yes    Alcohol/week: 0.0 standard drinks    Comment: ocass  . Drug use: No     Review of Systems  Constitutional: No fever/chills Eyes:  No discharge ENT: No upper respiratory complaints. Respiratory: no cough. No SOB/ use of accessory muscles to breath Gastrointestinal:   No nausea, no vomiting.  No diarrhea.  No constipation. Musculoskeletal: Patient has right ankle pain.  Skin: Negative for rash, abrasions, lacerations,  ecchymosis.    ____________________________________________   PHYSICAL EXAM:  VITAL SIGNS: ED Triage Vitals  Enc Vitals Group     BP 02/06/20 2040 (!) 141/78     Pulse Rate 02/06/20 2040 92     Resp 02/06/20 2040 16     Temp 02/06/20 2040 98.9 F (37.2 C)     Temp Source 02/06/20 2040 Oral     SpO2 02/06/20 2040 97 %     Weight 02/06/20 2042 300 lb (136.1 kg)     Height 02/06/20 2042 5\' 4"  (1.626 m)     Head Circumference --      Peak Flow --      Pain Score 02/06/20 2041 10     Pain Loc --      Pain Edu? --  Excl. in Industry? --      Constitutional: Alert and oriented. Well appearing and in no acute distress. Eyes: Conjunctivae are normal. PERRL. EOMI. Head: Atraumatic.  Cardiovascular: Normal rate, regular rhythm. Normal S1 and S2.  Good peripheral circulation. Respiratory: Normal respiratory effort without tachypnea or retractions. Lungs CTAB. Good air entry to the bases with no decreased or absent breath sounds Gastrointestinal: Bowel sounds x 4 quadrants. Soft and nontender to palpation. No guarding or rigidity. No distention. Musculoskeletal: Patient has soft tissue swelling over the right lateral malleolus. Patient is able to move all 5 right toes.  Palpable dorsalis pedis pulse, right Neurologic:  Normal for age. No gross focal neurologic deficits are appreciated.  Skin:  Skin is warm, dry and intact. No rash noted. Psychiatric: Mood and affect are normal for age. Speech and behavior are normal.   ____________________________________________   LABS (all labs ordered are listed, but only abnormal results are displayed)  Labs Reviewed - No data to display ____________________________________________  EKG   ____________________________________________  RADIOLOGY Unk Pinto, personally viewed and evaluated these images (plain radiographs) as part of my medical decision making, as well as reviewing the written report by the radiologist.  DG Ankle  Complete Right  Result Date: 02/06/2020 CLINICAL DATA:  Fall, ankle pain EXAM: RIGHT ANKLE - COMPLETE 3+ VIEW COMPARISON:  None. FINDINGS: There is a horizontally oriented nondisplaced fracture seen through the distal fibula. Significant overlying soft tissue swelling is seen. Ankle mortise appears to be congruent. There is significant surrounding subcutaneous edema. Calcaneal enthesophyte is seen. IMPRESSION: Nondisplaced fracture of the lateral malleolus. Significant overlying soft tissue swelling. Electronically Signed   By: Prudencio Pair M.D.   On: 02/06/2020 21:16    ____________________________________________    PROCEDURES  Procedure(s) performed:     Procedures     Medications  ketorolac (TORADOL) 30 MG/ML injection 30 mg (has no administration in time range)  HYDROcodone-acetaminophen (NORCO/VICODIN) 5-325 MG per tablet 1 tablet (1 tablet Oral Given 02/06/20 2121)  ondansetron (ZOFRAN-ODT) disintegrating tablet 4 mg (4 mg Oral Given 02/06/20 2121)     ____________________________________________   INITIAL IMPRESSION / ASSESSMENT AND PLAN / ED COURSE  Pertinent labs & imaging results that were available during my care of the patient were reviewed by me and considered in my medical decision making (see chart for details).      Assessment and plan Right ankle pain 34 year old female presents to the emergency department with acute right ankle pain after an inversion type ankle injury.  Patient sustained a lateral malleolus fracture.  Patient was placed in a cam boot and Norco and Toradol were given in the emergency department for pain.  She was advised to follow-up with orthopedics, Dr. Mack Guise.  Return precautions were given to return with new or worsening symptoms.  All patient questions were answered.   ____________________________________________  FINAL CLINICAL IMPRESSION(S) / ED DIAGNOSES  Final diagnoses:  Acute right ankle pain      NEW MEDICATIONS  STARTED DURING THIS VISIT:  ED Discharge Orders    None          This chart was dictated using voice recognition software/Dragon. Despite best efforts to proofread, errors can occur which can change the meaning. Any change was purely unintentional.     Lannie Fields, PA-C 02/06/20 2206    Earleen Newport, MD 02/06/20 2253

## 2020-05-23 ENCOUNTER — Other Ambulatory Visit: Payer: Self-pay

## 2020-05-26 ENCOUNTER — Other Ambulatory Visit: Payer: Self-pay

## 2020-05-26 ENCOUNTER — Encounter: Payer: Self-pay | Admitting: Nurse Practitioner

## 2020-05-26 ENCOUNTER — Telehealth (INDEPENDENT_AMBULATORY_CARE_PROVIDER_SITE_OTHER): Payer: 59 | Admitting: Nurse Practitioner

## 2020-05-26 VITALS — Ht 64.0 in | Wt 290.0 lb

## 2020-05-26 DIAGNOSIS — Z20822 Contact with and (suspected) exposure to covid-19: Secondary | ICD-10-CM

## 2020-05-26 DIAGNOSIS — J069 Acute upper respiratory infection, unspecified: Secondary | ICD-10-CM | POA: Diagnosis not present

## 2020-05-26 DIAGNOSIS — Z0000 Encounter for general adult medical examination without abnormal findings: Secondary | ICD-10-CM

## 2020-05-26 DIAGNOSIS — J45909 Unspecified asthma, uncomplicated: Secondary | ICD-10-CM

## 2020-05-26 NOTE — Progress Notes (Signed)
Virtual Visit via telephone Note  This visit type was conducted due to national recommendations for restrictions regarding the COVID-19 pandemic (e.g. social distancing).  This format is felt to be most appropriate for this patient at this time.  All issues noted in this document were discussed and addressed.  No physical exam was performed (except for noted visual exam findings with Video Visits).   I connected with@ on 05/27/20 at  4:00 PM EDT by a video enabled telemedicine application or telephone and verified that I am speaking with the correct person using two identifiers. Location patient: home Location provider: work or home office Persons participating in the virtual visit: patient, provider  I discussed the limitations, risks, security and privacy concerns of performing an evaluation and management service by telephone and the availability of in person appointments. I also discussed with the patient that there may be a patient responsible charge related to this service. The patient expressed understanding and agreed to proceed.  Interactive audio and video telecommunications were attempted between this provider and patient, however failed, due to patient having technical difficulties- no camera set up on her computer.  We continued and completed visit with audio only.   Reason for visit: To establish care with primary care provider.  She has also had cough, headaches, diarrhea, eye infection for several weeks duration.    HPI: 34 yo reports history of asthma, morbid obesity with BMI of 49, history of mitral valve prolapse, made this appointment to establish care with primary care provider.  She has utilize the walk-in clinic for an OB/GYN for healthcare.    She reports that she has been very sick for the last 3 or more weeks.  She feels like she has been hit by a truck.  Her symptoms started with a severe headache, and the next day she had body aches, sore throat, and few days later fever  103.  She has had head congestion, cold symptoms, and the last 2 weeks she has been having terrible cough, intermittent fevers and night sweats, poor appetite, and 25 pound weight loss.  She has had a cholecystectomy and ever since has had postprandial diarrhea that has continued.  She normally takes Imodium daily and there is really no change in her baseline diarrhea.  She has noted no chest pain, shortness of breath, dyspnea on exertion, but she is experiencing wheezing.  Pulse oximetry 95 to 96% even with walking.  She has a history of asthma and does have a nebulizer at home.  She has used it some but it increases her heart rate so she has not been using it daily.  She also has a rescue inhaler.  Her last fever was 100.4 last week and she thinks she still spiking as she wakes up at night with some sweats.  She denies dizziness, lightheadedness, confusion, but has poor energy and is having a difficult time getting her work done.  She has been taking cough medicine, cough drops, NyQuil, DayQuil, Mucinex DM.  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  She has not had the Covid vaccine.  She has not had testing for Covid.  Patient reports she lives with her husband and 50-year-old, stays home and has been super cautious about not going out about the community.  She thinks she may have a bronchitis as her asthma can go into bronchitis quite easily.  ROS: See pertinent positives and negatives per HPI.  Past Medical History:  Diagnosis Date  . Anxiety   .  Asthma   . Chicken pox   . Complication of anesthesia    issue with asthma and intubation  . Cystic dysplasia of one kidney    about 4 months ago-no f/u- largest 53mm?  . Diverticulitis   . Fibroid, uterine    dx a few months ago  . GERD (gastroesophageal reflux disease)   . H/O mitral valve prolapse    as a child  . Impaction, bowel ([place])   . Inappropriate sinus tachycardia    a. exacerbated by pregnancy - 2018; b. 01/2017  Holter: 29% of time in sinus tachycardia;  c. 02/2017 Echo: EF 65-70%, no rwma; c. 01/2017 Holter: 29% of time in sinus tachycardia.  . Kidney stones     Past Surgical History:  Procedure Laterality Date  . APPENDECTOMY    . CHOLECYSTECTOMY N/A 01/18/2016   Procedure: LAPAROSCOPIC CHOLECYSTECTOMY WITH INTRAOPERATIVE CHOLANGIOGRAM;  Surgeon: Excell Seltzer, MD;  Location: [facility];  Service: General;  Laterality: N/A;  . ruptured cyst    . TONSILLECTOMY AND ADENOIDECTOMY     age 81  . TYMPANOSTOMY TUBE PLACEMENT      Family History  Problem Relation Age of Onset  . Arthritis Mother   . Hypertension Mother   . Diabetes Mother   . Cervical cancer Mother   . Alcohol abuse Father   . Arthritis Father   . Hypertension Father   . Arthritis Maternal Grandmother   . Heart disease Maternal Grandmother   . Stroke Maternal Grandmother   . Hypertension Maternal Grandmother   . Diabetes Maternal Grandmother   . Arthritis Maternal Grandfather   . Colon cancer Maternal Grandfather   . Prostate cancer Maternal Grandfather   . Heart disease Maternal Grandfather   . Stroke Maternal Grandfather   . Hypertension Maternal Grandfather   . Arthritis Paternal Grandmother   . Heart disease Paternal Grandmother   . Hypertension Paternal Grandmother   . Diabetes Paternal Grandmother   . Arthritis Paternal Grandfather   . Heart disease Paternal Grandfather   . Hypertension Paternal Grandfather   . Thyroid disease Maternal Aunt        thyroid cancer   SOCIAL HX: Former smoker   Current Outpatient Medications:  .  albuterol (ACCUNEB) 1.25 MG/3ML nebulizer solution, Take 3 mLs by nebulization 2 (two) times daily as needed. Asthma., Disp: 75 mL, Rfl: 3 .  albuterol (PROVENTIL HFA;VENTOLIN HFA) 108 (90 Base) MCG/ACT inhaler, INHALE 2 PUFFS INTO THE LUNGS EVERY 6 (SIX) HOURS AS NEEDED FOR WHEEZING OR SHORTNESS OF BREATH., Disp: 8.5 Inhaler, Rfl: 3 .  Metoprolol-Hydrochlorothiazide 50-12.5 MG TB24,  metoprolol succ 50 mg-hydrochlorothiazide 12.5 mg tablet,ext.rel 24 hr, Disp: , Rfl:  .  Prenatal Vit-Fe Fumarate-FA (MULTIVITAMIN-PRENATAL) 27-0.8 MG TABS tablet, Take 1 tablet by mouth daily at 12 noon., Disp: , Rfl:   EXAM:  VITALS per patient if applicable: Weight 465 pulse ox 95 to 96%  GENERAL: Telephone: Patient sounds speaking in complete sentences, no signs of confusion.  She is coughing.  Point, and sound fatigued.  In no acute  distress  LUNGS: No signs of respiratory distress, audible wheezing.Marland Kitchen  Speaking in complete sentences.  Frequent congested cough and recovers quickly.  PSYCH/NEURO: pleasant and cooperative, no obvious depression or anxiety, speech and thought processing grossly intact  ASSESSMENT AND PLAN:  Discussed the following assessment and plan:  Upper respiratory tract infection, unspecified type  Suspected COVID-19 virus infection  Uncomplicated asthma, unspecified asthma severity, unspecified whether persistent  Morbid obesity ([place])  Encounter  for medical examination to establish care  No problem-specific Assessment & Plan notes found for this encounter.   Patient presents via telephone visit to establish care with primary care provider.   She presents with over 3-week history of  respiratory infection symptoms, headache, fever, poor appetite, weight loss, severe cough.  She has sought no medical care for this. Patient has underlying asthma, morbid obesity, and these risk factors for severe covid illness.  She denies shortness of breath, reports good pulse oximetry.  She is in no acute danger today and does not require ED visit. But, I recommend that she go to an Acute Care-such as [facility] or [facility] walk-in clinic.  I want her to be seen in person today for lung auscultation, chest x-ray, baseline laboratory studies, and Covid testing.  This was explained to the patient she voices understanding.  We will need further follow-up to  follow-up on the symptoms and give a complete physical exam.   I discussed the assessment and treatment plan with the patient. The patient was provided an opportunity to ask questions and all were answered. The patient agreed with the plan and demonstrated an understanding of the instructions.   The patient was advised to call back or seek an in-person evaluation if the symptoms worsen or if the condition fails to improve as anticipated.  I provided 25 minutes of non-face-to-face time during this encounter. Denice Paradise, NP Adult Nurse Practitioner [facility] [phone]

## 2020-05-27 ENCOUNTER — Telehealth: Payer: Self-pay | Admitting: Nurse Practitioner

## 2020-05-27 ENCOUNTER — Encounter: Payer: Self-pay | Admitting: Nurse Practitioner

## 2020-05-27 DIAGNOSIS — J069 Acute upper respiratory infection, unspecified: Secondary | ICD-10-CM | POA: Insufficient documentation

## 2020-05-27 DIAGNOSIS — Z0000 Encounter for general adult medical examination without abnormal findings: Secondary | ICD-10-CM | POA: Insufficient documentation

## 2020-05-27 DIAGNOSIS — Z20822 Contact with and (suspected) exposure to covid-19: Secondary | ICD-10-CM | POA: Insufficient documentation

## 2020-05-27 HISTORY — DX: Acute upper respiratory infection, unspecified: J06.9

## 2020-05-27 NOTE — Telephone Encounter (Signed)
I called to check on her in-person evaluation recommended yest. She told me she would be seen and I do not see that she has in Epic review. Her home and mobile answering machine did not identify the caller so I left a HIPAA approved message to call the clinic on Tuesday.  Message: Please go to an Urgent Care - recommended [facility] or [facility] walk in for evaluation and treatment.  I will send this via My Chart.

## 2020-05-27 NOTE — Patient Instructions (Addendum)
I recommend that you go to an Acute Care-such as [facility] or [facility] walk-in clinic.  I want you to be seen in person today for lung auscultation, chest x-ray, baseline laboratory studies, and Covid testing.   We will need further follow-up to follow-up on the symptoms and please call the office and make an  office visit for next week.       Viral Respiratory Infection A respiratory infection is an illness that affects part of the respiratory system, such as the lungs, nose, or throat. A respiratory infection that is caused by a virus is called a viral respiratory infection. Common types of viral respiratory infections include:  A cold.  The flu (influenza).  A respiratory syncytial virus (RSV) infection. What are the causes? This condition is caused by a virus. What are the signs or symptoms? Symptoms of this condition include:  A stuffy or runny nose.  Yellow or green nasal discharge.  A cough.  Sneezing.  Fatigue.  Achy muscles.  A sore throat.  Sweating or chills.  A fever.  A headache. How is this diagnosed? This condition may be diagnosed based on:  Your symptoms.  A physical exam.  Testing of nasal swabs. How is this treated? This condition may be treated with medicines, such as:  Antiviral medicine. This may shorten the length of time a person has symptoms.  Expectorants. These make it easier to cough up mucus.  Decongestant nasal sprays.  Acetaminophen or NSAIDs to relieve fever and pain. Antibiotic medicines are not prescribed for viral infections. This is because antibiotics are designed to kill bacteria. They are not effective against viruses. Follow these instructions at home:  Managing pain and congestion  Take over-the-counter and prescription medicines only as told by your health care provider.  If you have a sore throat, gargle with a salt-water mixture 3-4 times a day or as needed. To make a salt-water mixture,  completely dissolve -1 tsp of salt in 1 cup of warm water.  Use nose drops made from salt water to ease congestion and soften raw skin around your nose.  Drink enough fluid to keep your urine pale yellow. This helps prevent dehydration and helps loosen up mucus. General instructions  Rest as much as possible.  Do not drink alcohol.  Do not use any products that contain nicotine or tobacco, such as cigarettes and e-cigarettes. If you need help quitting, ask your health care provider.  Keep all follow-up visits as told by your health care provider. This is important. How is this prevented?   Get an annual flu shot. You may get the flu shot in late summer, fall, or winter. Ask your health care provider when you should get your flu shot.  Avoid exposing others to your respiratory infection. ? Stay home from work or school as told by your health care provider. ? Wash your hands with soap and water often, especially after you cough or sneeze. If soap and water are not available, use alcohol-based hand sanitizer.  Avoid contact with people who are sick during cold and flu season. This is generally fall and winter. Contact a health care provider if:  Your symptoms last for 10 days or longer.  Your symptoms get worse over time.  You have a fever.  You have severe sinus pain in your face or forehead.  The glands in your jaw or neck become very swollen. Get help right away if you:  Feel pain or pressure in your  chest.  Have shortness of breath.  Faint or feel like you will faint.  Have severe and persistent vomiting.  Feel confused or disoriented. Summary  A respiratory infection is an illness that affects part of the respiratory system, such as the lungs, nose, or throat. A respiratory infection that is caused by a virus is called a viral respiratory infection.  Common types of viral respiratory infections are a cold, influenza, and respiratory syncytial virus (RSV)  infection.  Symptoms of this condition include a stuffy or runny nose, cough, sneezing, fatigue, achy muscles, sore throat, and fevers or chills.  Antibiotic medicines are not prescribed for viral infections. This is because antibiotics are designed to kill bacteria. They are not effective against viruses. This information is not intended to replace advice given to you by your health care provider. Make sure you discuss any questions you have with your health care provider. Document Revised: 09/18/2018 Document Reviewed: 10/21/2017 Elsevier Patient Education  [place] Attack Prevention, Adult Although you may not be able to control the fact that you have asthma, you can take actions to prevent episodes of asthma (asthma attacks). These actions include:  Creating a written plan for managing and treating your asthma attacks (asthma action plan).  Monitoring your asthma.  Avoiding things that can irritate your airways or make your asthma symptoms worse (asthma triggers).  Taking your medicines as directed.  Acting quickly if you have signs or symptoms of an asthma attack. What are some ways to prevent an asthma attack? Create a plan Work with your health care provider to create an asthma action plan. This plan should include:  A list of your asthma triggers and how to avoid them.  A list of symptoms that you experience during an asthma attack.  Information about when to take medicine and how much medicine to take.  Information to help you understand your peak flow measurements.  Contact information for your health care providers.  Daily actions that you can take to control asthma. Monitor your asthma To monitor your asthma:  Use your peak flow meter every morning and every evening for 2-3 weeks. Record the results in a journal. A drop in your peak flow numbers on one or more days may mean that you are starting to have an asthma attack, even if you are not having  symptoms.  When you have asthma symptoms, write them down in a journal.  Avoid asthma triggers Work with your health care provider to find out what your asthma triggers are. This can be done by:  Being tested for allergies.  Keeping a journal that notes when asthma attacks occur and what may have contributed to them.  Asking your health care provider whether other medical conditions make your asthma worse. Common asthma triggers include:  Dust.  Smoke. This includes campfire smoke and secondhand smoke from tobacco products.  Pet dander.  Trees, grasses or pollens.  Very cold, dry, or humid air.  Mold.  Foods that contain high amounts of sulfites.  Strong smells.  Engine exhaust and air pollution.  Aerosol sprays and fumes from household cleaners.  Household pests and their droppings, including dust mites and cockroaches.  Certain medicines, including NSAIDs. Once you have determined your asthma triggers, take steps to avoid them. Depending on your triggers, you may be able to reduce the chance of an asthma attack by:  Keeping your home clean. Have someone dust and vacuum your home for you 1 or 2 times  a week. If possible, have them use a high-efficiency particulate arrestance (HEPA) vacuum.  Washing your sheets weekly in hot water.  Using allergy-proof mattress covers and casings on your bed.  Keeping pets out of your home.  Taking care of mold and water problems in your home.  Avoiding areas where people smoke.  Avoiding using strong perfumes or odor sprays.  Avoid spending a lot of time outdoors when pollen counts are high and on very windy days.  Talking with your health care provider before stopping or starting any new medicines. Medicines Take over-the-counter and prescription medicines only as told by your health care provider. Many asthma attacks can be prevented by carefully following your medicine schedule. Taking your medicines correctly is  especially important when you cannot avoid certain asthma triggers. Even if you are doing well, do not stop taking your medicine and do not take less medicine. Act quickly If an asthma attack happens, acting quickly can decrease how severe it is and how long it lasts. Take these actions:  Pay attention to your symptoms. If you are coughing, wheezing, or having difficulty breathing, do not wait to see if your symptoms go away on their own. Follow your asthma action plan.  If you have followed your asthma action plan and your symptoms are not improving, call your health care provider or seek immediate medical care at the nearest hospital. It is important to write down how often you need to use your fast-acting rescue inhaler. You can track how often you use an inhaler in your journal. If you are using your rescue inhaler more often, it may mean that your asthma is not under control. Adjusting your asthma treatment plan may help you to prevent future asthma attacks and help you to gain better control of your condition. How can I prevent an asthma attack when I exercise? Exercise is a common asthma trigger. To prevent asthma attacks during exercise:  Follow advice from your health care provider about whether you should use your fast-acting inhaler before exercising. Many people with asthma experience exercise-induced bronchoconstriction (EIB). This condition often worsens during vigorous exercise in cold, humid, or dry environments. Usually, people with EIB can stay very active by using a fast-acting inhaler before exercising.  Avoid exercising outdoors in very cold or humid weather.  Avoid exercising outdoors when pollen counts are high.  Warm up and cool down when exercising.  Stop exercising right away if asthma symptoms start. Consider taking part in exercises that are less likely to cause asthma symptoms such as:  Indoor swimming.  Biking.  Walking.  Hiking.  Playing football. This  information is not intended to replace advice given to you by your health care provider. Make sure you discuss any questions you have with your health care provider. Document Revised: 08/23/2017 Document Reviewed: 02/25/2016 Elsevier Patient Education  2020 [employer].

## 2020-06-02 ENCOUNTER — Encounter: Payer: Self-pay | Admitting: Nurse Practitioner

## 2020-06-02 ENCOUNTER — Telehealth (INDEPENDENT_AMBULATORY_CARE_PROVIDER_SITE_OTHER): Payer: 59 | Admitting: Nurse Practitioner

## 2020-06-02 VITALS — BP 95/60 | Ht 64.02 in | Wt 285.0 lb

## 2020-06-02 DIAGNOSIS — R5383 Other fatigue: Secondary | ICD-10-CM | POA: Diagnosis not present

## 2020-06-02 DIAGNOSIS — R062 Wheezing: Secondary | ICD-10-CM

## 2020-06-02 DIAGNOSIS — R0602 Shortness of breath: Secondary | ICD-10-CM

## 2020-06-02 DIAGNOSIS — R634 Abnormal weight loss: Secondary | ICD-10-CM

## 2020-06-02 NOTE — Progress Notes (Signed)
Virtual Visit via virtual Note  This visit type was conducted due to national recommendations for restrictions regarding the COVID-19 pandemic (e.g. social distancing).  This format is felt to be most appropriate for this patient at this time.  All issues noted in this document were discussed and addressed.  No physical exam was performed (except for noted visual exam findings with Video Visits).   I connected with@ on 06/02/20 at  4:30 PM EDT by a video enabled telemedicine application or telephone and verified that I am speaking with the correct person using two identifiers. Location patient: home Location provider: work  Persons participating in the virtual visit: patient, provider  I discussed the limitations, risks, security and privacy concerns of performing an evaluation and management service by telephone and the availability of in person appointments. I also discussed with the patient that there may be a patient responsible charge related to this service. The patient expressed understanding and agreed to proceed.   Reason for visit: Severe wheezing and fatigue with cough    HPI: This 34 year old patient with history of asthma, morbid obesity, mitral valve prolapse, returns  for continued severe wheezing, severe, debilitating fatigue, cough, and shortness of breath with walking. She also has a 2 week hx of right eye with mild purulent drainage, redness, scratchy feeling and is using OTC drops that her husband picked up for her. She is not wearing her contact lenses. She was seen on video visit  05/26/2020 with Covid like sx including those above and was advised to get an in-person evaluation at an urgent care and to get a Covid test.   She did not seek in-person care as advised on 05/26/2020 until she went to [facility] yesterday for the same symptoms.  She presented with audible wheezing, severe wheezing on auscultation, difficulty speaking in complete sentences, DOE  and had a normal  chest x-ray. She has a Covid test pending. The patient reports that her pulse oximetry was 95%, and that with exertion her heart rate rose to 145 BPM and recovered to HR of 110 with rest. She was advised to go to the ED d/t her labored breathing and severe audible wheezing. She did not seek care at emergency department yesterday and instead set up a video visit today.    Today, she reports continued problems with wheezing, shortness of breath with walking, and cough.  She has been using her albuterol rescue inhaler 3-6 times a day and it has not helped her wheezing.  She has no sputum production, fevers or chills. No chest pain. She reports a 25 pound weight loss during the month that she has been ill. She reports she gets drenched in sweat with activity.  She reports a  2 week hx of  an irritated right eye and has been treating it with over-the-counter eye drops. She reports she has performed Covid tests at home  that were negative. She is still using crutches for a closed ankle fracture.  She states today that she feels like she could die.  She did not go to the emergency room as instructed yesterday  because of several reasons including a long wait, the risk of catching Covid if she does not have it, and the expense, and the fact that she does not feel ill enough to have to use the emergency department.  Wt Readings from Last 3 Encounters:  06/02/20 285 lb (129.3 kg)  05/26/20 290 lb (131.5 kg)  02/06/20 300 lb (136.1 kg)  ROS: See pertinent positives and negatives per HPI.  Past Medical History:  Diagnosis Date  . Anxiety   . Asthma   . Chicken pox   . Complication of anesthesia    issue with asthma and intubation  . Cystic dysplasia of one kidney    about 4 months ago-no f/u- largest 51mm?  . Diverticulitis   . Fibroid, uterine    dx a few months ago  . GERD (gastroesophageal reflux disease)   . H/O mitral valve prolapse    as a child  . Impaction, bowel ([place])   . Inappropriate sinus  tachycardia    a. exacerbated by pregnancy - 2018; b. 01/2017 Holter: 29% of time in sinus tachycardia;  c. 02/2017 Echo: EF 65-70%, no rwma; c. 01/2017 Holter: 29% of time in sinus tachycardia.  . Kidney stones     Past Surgical History:  Procedure Laterality Date  . APPENDECTOMY    . CHOLECYSTECTOMY N/A 01/18/2016   Procedure: LAPAROSCOPIC CHOLECYSTECTOMY WITH INTRAOPERATIVE CHOLANGIOGRAM;  Surgeon: Excell Seltzer, MD;  Location: [facility];  Service: General;  Laterality: N/A;  . ruptured cyst    . TONSILLECTOMY AND ADENOIDECTOMY     age 48  . TYMPANOSTOMY TUBE PLACEMENT      Family History  Problem Relation Age of Onset  . Arthritis Mother   . Hypertension Mother   . Diabetes Mother   . Cervical cancer Mother   . Alcohol abuse Father   . Arthritis Father   . Hypertension Father   . Arthritis Maternal Grandmother   . Heart disease Maternal Grandmother   . Stroke Maternal Grandmother   . Hypertension Maternal Grandmother   . Diabetes Maternal Grandmother   . Arthritis Maternal Grandfather   . Colon cancer Maternal Grandfather   . Prostate cancer Maternal Grandfather   . Heart disease Maternal Grandfather   . Stroke Maternal Grandfather   . Hypertension Maternal Grandfather   . Arthritis Paternal Grandmother   . Heart disease Paternal Grandmother   . Hypertension Paternal Grandmother   . Diabetes Paternal Grandmother   . Arthritis Paternal Grandfather   . Heart disease Paternal Grandfather   . Hypertension Paternal Grandfather   . Thyroid disease Maternal Aunt        thyroid cancer    SOCIAL HX: Former smoker   Current Outpatient Medications:  .  albuterol (ACCUNEB) 1.25 MG/3ML nebulizer solution, Take 3 mLs by nebulization 2 (two) times daily as needed. Asthma., Disp: 75 mL, Rfl: 3 .  albuterol (PROVENTIL HFA;VENTOLIN HFA) 108 (90 Base) MCG/ACT inhaler, INHALE 2 PUFFS INTO THE LUNGS EVERY 6 (SIX) HOURS AS NEEDED FOR WHEEZING OR SHORTNESS OF BREATH., Disp: 8.5 Inhaler,  Rfl: 3 .  Metoprolol-Hydrochlorothiazide 50-12.5 MG TB24, metoprolol succ 50 mg-hydrochlorothiazide 12.5 mg tablet,ext.rel 24 hr, Disp: , Rfl:  .  Prenatal Vit-Fe Fumarate-FA (MULTIVITAMIN-PRENATAL) 27-0.8 MG TABS tablet, Take 1 tablet by mouth daily at 12 noon., Disp: , Rfl:   EXAM:  VITALS per patient if applicable: Pulse oximetry 97%, heart rate 107  GENERAL: alert, oriented, appears ill, and fatigued.   HEENT: atraumatic, Rt eye is mildly injected, no drainage or swelling  noted.   NECK: normal movements of the head and neck  LUNGS: Obvious breathiness with speaking.  Patient got up to and get her pulse oximetry when she came back she was visibly short of breath with audible wheezing.  No spontaneous coughing noted during the interview.  When she was asked to cough, she has a tight wheezing  cough.  Forced expiration yields audible wheezing.  She appears short of breath at rest with speaking.     CV: no obvious cyanosis, appears pale  MS: moves all visible extremities without noticeable abnormality. Using crutches for ankle fracture.  PSYCH/NEURO: pleasant and cooperative, no obvious depression or anxiety, speech and thought processing grossly intact  ASSESSMENT AND PLAN:  Discussed the following assessment and plan:  Shortness of breath  Wheezing  Fatigue, unspecified type  Unintentional weight loss  No problem-specific Assessment & Plan notes found for this encounter.  We had a 28-minute virtual video visit whereby we have discussed her symptoms and the fact that with a  normal chest x-ray done at [facility] yesterday and her length of duration of symptoms > 1 month and  audible wheezing, visible SOB at rest with breathiness with speaking, profound fatigue, DOE, diaphoresis with activity it is imperative that she go to the ED for further testing and obtain a diagnosis and  treatment. We spoke of a possible cardiac cause of her symptoms, or a prolonged, severe asthma flare that  needs steroids, nebulizer treatments, or a  PE. There are several possible diagnosis and I cannot prescribe for her over the video without more testing. The patient was provided an opportunity to ask questions and all were answered. We discussed the fact that yes, she is ill enough to use the ED, and it is not likely that she will catch Covid while there- wear mask and talk to the staff about her concerns, and the cost cannot be a factor in her getting urgent medical treatment. She has health insurance.   She voices understanding and agrees to go to the ED today after she turns her 34 yo over to her husband's care. I clearly advised how important this is since she is getting more ill the longer she waits. She verbalizes understanding of this and is in agreement with the plan to go to the ED ASAP.   I provided 35 minutes of non-face-to-face time during this encounter.Denice Paradise, NP Adult Nurse Practitioner [place] [phone]

## 2020-06-02 NOTE — Patient Instructions (Addendum)
Please go to the closest Emergency Room today after we complete the video. Do not delay as your breathing symptoms are getting worse and you need more testing for accurate diagnosis and treatment. With your degree of wheezing- you can go into a lung spasm and shut off your air all together. This is called a  severe bronchospasm. This can be life threatening.It would be best for you not to drive yourself and if needed call 911 for transport.    Shortness of Breath, Adult Shortness of breath is when a person has trouble breathing enough air or when a person feels like she or he is having trouble breathing in enough air. Shortness of breath could be a sign of a medical problem. Follow these instructions at home:   Pay attention to any changes in your symptoms.  Do not use any products that contain nicotine or tobacco, such as cigarettes, e-cigarettes, and chewing tobacco.  Do not smoke. Smoking is a common cause of shortness of breath. If you need help quitting, ask your health care provider.  Avoid things that can irritate your airways, such as: ? Mold. ? Dust. ? Air pollution. ? Chemical fumes. ? Things that can cause allergy symptoms (allergens), if you have allergies.  Keep your living space clean and free of mold and dust.  Rest as needed. Slowly return to your usual activities.  Take over-the-counter and prescription medicines only as told by your health care provider. This includes oxygen therapy and inhaled medicines.  Keep all follow-up visits as told by your health care provider. This is important. Contact a health care provider if:  Your condition does not improve as soon as expected.  You have a hard time doing your normal activities, even after you rest.  You have new symptoms. Get help right away if:  Your shortness of breath gets worse.  You have shortness of breath when you are resting.  You feel light-headed or you faint.  You have a cough that is not  controlled with medicines.  You cough up blood.  You have pain with breathing.  You have pain in your chest, arms, shoulders, or abdomen.  You have a fever.  You cannot walk up stairs or exercise the way that you normally do. These symptoms may represent a serious problem that is an emergency. Do not wait to see if the symptoms will go away. Get medical help right away. Call your local emergency services (911 in the [place] Do not drive yourself to the hospital. Summary  Shortness of breath is when a person has trouble breathing enough air. It can be a sign of a medical problem.  Avoid things that irritate your lungs, such as smoking, pollution, mold, and dust.  Pay attention to changes in your symptoms and contact your health care provider if you have a hard time completing daily activities because of shortness of breath. This information is not intended to replace advice given to you by your health care provider. Make sure you discuss any questions you have with your health care provider. Document Revised: 02/10/2018 Document Reviewed: 02/10/2018 Elsevier Patient Education  [place].  Asthma Attack  Acute bronchospasm caused by asthma is also referred to as an asthma attack. Bronchospasm means that the air passages become narrowed or "tight," which limits the amount of oxygen that can get into the lungs. The narrowing is caused by inflammation and tightening of the muscles in the air tubes (bronchi) in the lungs. Excessive mucus is  also produced, which narrows the airways more. This can cause trouble breathing, coughing, and loud breathing (wheezing). What are the causes? Possible triggers include:  Animal dander from the skin, hair, or feathers of animals.  Dust mites contained in house dust.  Cockroaches.  Pollen from trees or grass.  Mold.  Cigarette or tobacco smoke.  Air pollutants such as dust, household cleaners, hair sprays, aerosol sprays, paint fumes,  strong chemicals, or strong odors.  Cold air or weather changes. Cold air may trigger inflammation. Winds increase molds and pollens in the air.  Strong emotions such as crying or laughing hard.  Stress.  Certain medicines, such as aspirin or beta-blockers.  Sulfites in foods and drinks, such as dried fruits and wine.  Infections or inflammatory conditions, such as a flu, a cold, pneumonia, or inflammation of the nasal membranes (rhinitis).  Gastroesophageal reflux disease (GERD). GERD is a condition in which stomach acid backs up into your esophagus, which can irritate nearby airway structures.  Exercise or activity that requires a lot of energy. What are the signs or symptoms? Symptoms of this condition include:  Wheezing. This may sound like whistling while breathing. This may be more noticeable at night.  Excessive coughing, particularly at night.  Chest tightness or pain.  Shortness of breath.  Feeling like you cannot get enough air no matter how hard you try (air hunger). How is this diagnosed? This condition may be diagnosed based on:  Your medical history.  Your symptoms.  A physical exam.  Tests to check for other causes of your symptoms or other conditions that may have triggered your asthma attack. These tests may include: ? Chest X-ray. ? Blood tests. ? Specialized tests to assess lung function, such as breathing into a device that measures how much air you inhale and exhale (spirometry). How is this treated? The goal of treatment is to open the airways in your lungs and reduce inflammation. Most asthma attacks are treated with medicines that you inhale through a hand-held inhaler (metered dose inhaler, MDI) or a device that turns liquid medicine into a mist that you inhale (nebulizer). Medicines may include:  Quick relief or rescue medicines that relax the muscles of the bronchi. These medicines include bronchodilators, such as albuterol.  Controller  medicines, such as inhaled corticosteroids. These are long-acting medicines that are used for daily asthma maintenance. If you have a moderate or severe asthma attack, you may be treated with steroid medicines by mouth or through an IV injection at the hospital. Steroid medicines reduce inflammation in your lungs. Depending on the severity of your attack, you may need oxygen therapy to help you breathe. If your asthma attack was caused by a bacterial infection, such as pneumonia, you will be given antibiotic medicines. Follow these instructions at home: Medicines  Take over-the-counter and prescription medicines only as told by your health care provider. Keep your medicines up-to-date and available.  If you are more than [redacted] weeks pregnant and you are prescribed any new medicines, tell your obstetrician about those medicines.  If you were prescribed an antibiotic medicine, take it as told by your health care provider. Do not stop taking the antibiotic even if you start to feel better. Avoiding triggers   Keep track of things that trigger your asthma attacks or cause you to have breathing problems, and avoid exposure to these triggers.  Do not use any products that contain nicotine or tobacco, such as cigarettes and e-cigarettes. If you need help quitting, ask  your health care provider.  Avoid secondhand smoke.  Avoid strong smells, such as perfumes, aerosols, and cleaning solvents.  When pollen or air pollution is bad, keep windows closed and use an air conditioner or go to places with air conditioning. Asthma action plan  Work with your health care provider to make a written plan for managing and treating your asthma attacks (asthma action plan). This plan should include: ? A list of your asthma triggers and how to avoid them. ? Information about when your medicines should be taken and when their dosage should be changed. ? Instructions about using a device called a peak flow meter to  monitor your condition. A peak flow meter measures how well your lungs are working and measures how severe your asthma is at a given time. Your "personal best" is the highest peak flow rate you can reach when you feel good and have no asthma symptoms. General instructions  Avoid excessive exercise or activity until your asthma attack resolves. Ask your health care provider what activities are safe for you and when you can return to your normal activities.  Stay up to date on all vaccinations recommended by your health care provider, such as flu and pneumonia vaccines.  Drink enough fluid to keep your urine clear or pale yellow. Staying hydrated helps keep mucus in your lungs thin so it can be coughed up easily.  If you drink caffeine, do so in moderation.  Do not use alcohol until you have recovered.  Keep all follow-up visits as told by your health care provider. This is important. Asthma requires careful medical care, and you and your health care provider can work together to reduce the likelihood of future attacks. Contact a health care provider if:  Your peak flow reading is still at 50-79% of your personal best after you have followed your action plan for 1 hour. This is in the yellow zone, which means "caution."  You need to use a reliever medicine more than 2-3 times a week.  Your medicines are causing side effects, such as: ? Rash. ? Itching. ? Swelling. ? Trouble breathing.  Your symptoms do not improve after 48 hours.  You cough up mucus (sputum) that is thicker than usual.  You have a fever.  You need to use your medicines much more frequently than normal. Get help right away if:  Your peak flow reading is less than 50% of your personal best. This is in the red zone, which means "danger."  OR You have severe trouble breathing.  OR You develop chest pain or discomfort.  OR your medicines no longer seem to be helping.  OR You vomit.  OR You cannot eat or drink  without vomiting.  OR You are coughing up yellow, green, brown, or bloody mucus.  OR You have a fever and your symptoms suddenly get worse.  OR You have trouble swallowing.  OR You feel very tired, and breathing becomes tiring. Summary  Acute bronchospasm caused by asthma is also referred to as an asthma attack.  Bronchospasm is caused by narrowing or tightness in air passages, which causes shortness of breath, coughing, and loud breathing (wheezing).  Many things can trigger an asthma attack, such as allergens, weather changes, exercise, smoke, and other fumes.  Treatment for an asthma attack may include inhaled rescue medicines for immediate relief, as well as the use of maintenance therapy.  Get help right away if you have worsening shortness of breath, chest pain, or fever, or  if your home medicines are no longer helping with your symptoms. This information is not intended to replace advice given to you by your health care provider. Make sure you discuss any questions you have with your health care provider. Document Revised: 12/30/2018 Document Reviewed: 10/12/2016 Elsevier Patient Education  [place].

## 2020-06-03 ENCOUNTER — Telehealth: Payer: Self-pay | Admitting: Nurse Practitioner

## 2020-06-03 NOTE — Telephone Encounter (Addendum)
I called her and had to leave a HIPAA message to call the office. I would like to talk with her about plan of care. I see that she did not go to the ED as  advised. I will order CT chest. EKG, labs through the [place] if she continues to refuse [facility] or ED.   4:14 pm: I called her again and [facility] to call the office for plan of care. I LMOM on her husband's mobile phone- contact number, as well.

## 2020-06-05 NOTE — Progress Notes (Signed)
Office Visit    Patient Name: Jane Jackson Date of Encounter: 06/06/2020  Primary Care Provider:  Marval Regal, NP Primary Cardiologist:  Kathlyn Sacramento, MD  Chief Complaint    Chief Complaint  Patient presents with  . office visit    Patient reports low blood pressure readings, elevated heart rate, and LE edema. Patient states she has not been taking her medication consistently; Meds verbally reviewed with patient.    34 yo with history of inappropriate sinus tachycardia, morbid obesity, nephrolithiasis, and GERD, and who presents for follow-up with tachypalpitations,.SOB/DOE, LEE, daily diarrhea due to cholesystectomy, and fatigue.   Past Medical History    Past Medical History:  Diagnosis Date  . Anxiety   . Asthma   . Chicken pox   . Complication of anesthesia    issue with asthma and intubation  . Cystic dysplasia of one kidney    about 4 months ago-no f/u- largest 57mm?  . Diverticulitis   . Fibroid, uterine    dx a few months ago  . GERD (gastroesophageal reflux disease)   . H/O mitral valve prolapse    as a child  . Impaction, bowel ([place])   . Inappropriate sinus tachycardia    a. exacerbated by pregnancy - 2018; b. 01/2017 Holter: 29% of time in sinus tachycardia;  c. 02/2017 Echo: EF 65-70%, no rwma; c. 01/2017 Holter: 29% of time in sinus tachycardia.  . Kidney stones    Past Surgical History:  Procedure Laterality Date  . APPENDECTOMY    . CHOLECYSTECTOMY N/A 01/18/2016   Procedure: LAPAROSCOPIC CHOLECYSTECTOMY WITH INTRAOPERATIVE CHOLANGIOGRAM;  Surgeon: Excell Seltzer, MD;  Location: [facility];  Service: General;  Laterality: N/A;  . ruptured cyst    . TONSILLECTOMY AND ADENOIDECTOMY     age 51  . TYMPANOSTOMY TUBE PLACEMENT      Allergies  Allergies  Allergen Reactions  . Latex Itching and Rash    History of Present Illness    Jane Jackson is a 34 y.o. female with PMH as above.   She was previously dx with inappropriate ST in 2018  during her pregnancy. She wore a holter monitor at that time, showing ST at 29% burden. Echo showed nl LVSF without RWMA. Sx improved slightly with Toprol XL 25mg  qd and more so after delivering. She weaned herself off of BB tx after delivery but restarted it with recurrent episodes of tachypalpitations.   When last seen in clinic by Murray Hodgkins, NP 07/02/2018, she noted breakthrough episodes of tachypalpitations without clear triggers. BP was noted to run on the soft side at home with SBP 100-110s but 126/80 in clinic. She was trying to watch her diet and lose weight, reportedly down 5 lbs from her last visit. EKG NSR at 75bpm. Adequate hydration was discussed, activity, and weight loss. She was referred to a weight management clinic. Toprol XL was increased to 37.5mg  qd. Plan was that if palpitations persisted with stable BP, she would call the office, at which time she could increase to 50mg  qd.   On 04/27/2019, she called the clinic regarding starting phentermine for wt loss, with initiation or a trial approved by her primary cardiologist.  She reports that, since that time, she has discontinued phentermine due to a bad headache.  Since that time, it appears that she has transitioned from Toprol to metoprolol -HCTZ 50-12.5.  02/06/2020, she presented to [facility] after an acute fracture of the distal end of the right fibula due to a  mechanical fall.  She reports that this occurred when she walked into a ditch and rolled her ankle.    Today, 06/06/2020, she returns to clinic and notes that she has not been taking her Toprol; moreover, since her last visit, her Toprol was changed to metoprolol-HCTZ 50-12.5 with patient unable to recall at which time this transition was made; however, on review of EMR, it appears as if this change was made 03/30/2019 .  Nonetheless, she felt as if both medications made her feel fatigued, which was her reason to discontinue it several weeks before her visit. Now, however, she  feels even more poorly and suspects it as due to discontinuing her BB.  Over the last month, she has felt shortness of breath with racing heart rate at rest. She reports her smart watch tracks her heart rate and has noted rates from the 30s to 150s range.  She states that her heart rate is not always the low 30s when she is sleeping.  She has also notes inconsistent DOE without clear triggers.  She does note that some of this could be due to her asthma. She is also noted a new expiratory wheeze, which is different from her asthma.  She notes mild bilateral lower extremity edema, which she suspects may be at least somewhat dependent edema.  She describes occasionally feeling short of breath when laying down at night, but she is not certain if this is due to orthopnea or some element of obstructive sleep apnea given her body habitus.  She otherwise does not report that she has any signs or symptoms of sleep apnea, as she does not snore or stop breathing to her knowledge.   (Of note, she discontinued phentermine due to a bad headache as above).  Her fatigue, she is is drinking more caffeine at 3 to 4 cups lately, which could be contributing to her elevated rates.  She expresses frustration that, due to her fatigue, she has been unable to exercise due to low energy (and now her right fibula fracture).  She reports that she was working out 3 days/week before she fractured her right fibula (confirmed this was a mechanical fall).  No CP, presyncope, syncope.  She is aware that an element of weight and deconditioning is contributing to her current fatigue.  She wants to get the weight off; however, she notes that she easily gains weight, ever since her Depo-Provera shot and not made easy when getting baby weight off.  She tries to make healthy food choices, though sometimes does drink some of her son's juice box.  She reports that her cholecystectomy also influences her food choices (needing boost) and results in daily  diarrhea.  She reports that she has increased hydration as previously recommended.  BP today 120/80 with sinus tachycardia at 115 bpm.  Weight at previous clinic 278 today 285.  Home Medications    Prior to Admission medications   Medication Sig Start Date End Date Taking? Authorizing Provider  albuterol (ACCUNEB) 1.25 MG/3ML nebulizer solution Take 3 mLs by nebulization 2 (two) times daily as needed. Asthma. 01/03/17   Rubie Maid, MD  albuterol (PROVENTIL HFA;VENTOLIN HFA) 108 (90 Base) MCG/ACT inhaler INHALE 2 PUFFS INTO THE LUNGS EVERY 6 (SIX) HOURS AS NEEDED FOR WHEEZING OR SHORTNESS OF BREATH. 05/20/17   Rubie Maid, MD  meloxicam (MOBIC) 15 MG tablet meloxicam 15 mg tablet  Take 1 tablet every day by oral route.    [provider]  Metoprolol-Hydrochlorothiazide 50-12.5 MG [phone]  metoprolol succ 50 mg-hydrochlorothiazide 12.5 mg tablet,ext.rel 24 hr    [provider]  Prenatal Vit-Fe Fumarate-FA (MULTIVITAMIN-PRENATAL) 27-0.8 MG TABS tablet Take 1 tablet by mouth daily at 12 noon.    [provider]    Review of Systems    She reports occasional shortness of breath x1 month, racing heart/palpitations rate at rest, occasional bradycardic rates, occasional dyspnea without clear triggers, occasional orthopnea-like symptoms (again without clear triggers).  She reports fatigue and mild LEE.  She reports weight gain and daily diarrhea.  She reports fatigue associated with her beta-blocker and headache associated with phentermine.  She denies chest pain, pnd, presyncope, syncope, or early satiety.   All other systems reviewed and are otherwise negative except as noted above.  Physical Exam    VS:  BP 120/80 (BP Location: Left Arm, Patient Position: Sitting, Cuff Size: Large)   Pulse (!) 115   Ht 5\' 4"  (1.626 m)   Wt 285 lb (129.3 kg)   SpO2 97%   BMI 48.92 kg/m  , BMI Body mass index is 48.92 kg/m. GEN: Well nourished, well developed, in no acute  distress. HEENT: normal. Neck: Supple, no JVD, carotid bruits, or masses. Cardiac: Tachycardic but regular, no murmurs, rubs, or gallops. No clubbing, cyanosis, mild bilateral edema.  Radials/DP/PT 2+ and equal bilaterally.  Respiratory:  Respirations regular and unlabored, CTAB (no expiratory wheeze during the specific time of my exam). GI: Soft, nontender, nondistended, BS + x 4. MS: no deformity or atrophy. Skin: warm and dry, no rash. Neuro:  Strength and sensation are intact. Psych: Normal affect.  Accessory Clinical Findings    ECG personally reviewed by me today - ST, 115bpm, PRi 174ms, QRS 34ms, QTC 455ms, nonspecific TW abnormality versus artifact  - no acute changes.  VITALS Reviewed today   Temp Readings from Last 3 Encounters:  02/07/20 98.2 F (36.8 C) (Oral)  12/16/17 98.4 F (36.9 C)  11/01/16 98.1 F (36.7 C) (Oral)   BP Readings from Last 3 Encounters:  06/06/20 120/80  06/02/20 95/60  02/07/20 124/64   Pulse Readings from Last 3 Encounters:  06/06/20 (!) 115  02/07/20 68  04/27/19 78    Wt Readings from Last 3 Encounters:  06/06/20 285 lb (129.3 kg)  06/02/20 285 lb (129.3 kg)  05/26/20 290 lb (131.5 kg)     LABS  reviewed today   Lab Results  Component Value Date   WBC 7.7 03/30/2019   HGB 13.3 03/30/2019   HCT 39.1 03/30/2019   MCV 88 03/30/2019   PLT 306 03/30/2019   Lab Results  Component Value Date   CREATININE 0.87 04/27/2019   BUN 13 04/27/2019   NA 140 04/27/2019   K 4.8 04/27/2019   CL 102 04/27/2019   CO2 22 04/27/2019   Lab Results  Component Value Date   ALT 61 (H) 04/27/2019   AST 28 04/27/2019   ALKPHOS 70 04/27/2019   BILITOT <0.2 04/27/2019   No results found for: CHOL, HDL, LDLCALC, LDLDIRECT, TRIG, CHOLHDL  Lab Results  Component Value Date   HGBA1C 5.6 04/27/2019   Lab Results  Component Value Date   TSH 1.820 03/30/2019     STUDIES/PROCEDURES reviewed today   02/2017 ST at 29%  burden  02/28/2017 Echo - Left ventricle: The cavity size was normal. Wall thickness was  normal. Systolic function was vigorous. The estimated ejection  fraction was in the range of 65% to 70%. Wall motion was normal;  there were  no regional wall motion abnormalities. Left  ventricular diastolic function parameters were normal.   Assessment & Plan    Inappropriate sinus tachycardia -First noted during her pregnancy in 2018.  Improved following delivery and with addition of beta-blocker per previous progress notes.  At previous visits, the dose of her Toprol has been increased to control breakthrough tachypalpitations.  Since her last visit, her Toprol has been changed to metoprolol-HCTZ.  She notes fatigue associated with BB use. Several weeks ago, she discontinued her metoprolol-HCTZ with subsequent worsening in her tachypalpitations and breathing status, LEE, and sx as detailed above.  She reports significant fatigue. --Restart Toprol and discontinue metoprolol-HCTZ in the setting of her diarrhea and recent fatigue. Retrial Toprol until echo obtained as below. If Toprol does not adequately control her symptoms, and once we obtain her EF, recommend trial of Cardizem.  Discussed LEE as a possible side effect of Cardizem. -- Given her sx above of LEE and SOB/DOE and possible orthopnea, we will obtain a repeat  Echo to reassess EF and structure, as well as heart pressures. We will also need a recent EF before starting a trial of Cardizem, as above, if Toprol continues to suboptimally control her sx and result in fatigue.  --Check a BMET to ensure renal function and electrolytes are stable with daily diarrhea and given fatigue and tachypalpitations. We will also check a CBC to rule out anemia, as well as TSH/FT4 to ensure her thyroid is not playing a role.   --Repeat 2 week cardiac monitor with Zio XT given her new sx as above with rates on smart watch sometimes in the 30s at rest and while  sleeping. Also recommend obtain a sleep study. --Ongoing dietary and lifestyle changes. Reducing caffeine will help, as well as weight loss and increased activity. Unfortunately, her current right fibula fracture limits her activity and daily diarrhea influences diet.     Continue to recommend hydration with total fluids under 2 L and salt under 2 g daily.  BMI 45.0-50.0 -As above.  Discontinued phentermine due to headache.  Will check TSH/free T4.  We discussed weight management clinic referral versus referral to a nutritionist with patient politely declining, as she has not benefited from these referrals in the past.  Given her daily diarrhea, could consider GI referral per PCP.  Escalate activity as tolerated by her right fibula fracture.   Medication changes: Restart Toprol 50 mg daily as a trial and until obtaining echo Labs ordered: BMET, TSH, free T4 Studies / Imaging ordered: Echo, 2-week ZIO XT/cardiac monitoring, referral for sleep study Future considerations: Cardizem Disposition: RTC 3 weeks  Total time spent with patient today 45 minutes. This includes reviewing records, evaluating the patient, and coordinating care. Face-to-face time >50%.    Arvil Chaco, PA-C 06/06/2020

## 2020-06-06 ENCOUNTER — Encounter: Payer: Self-pay | Admitting: Physician Assistant

## 2020-06-06 ENCOUNTER — Other Ambulatory Visit: Payer: Self-pay

## 2020-06-06 ENCOUNTER — Ambulatory Visit (INDEPENDENT_AMBULATORY_CARE_PROVIDER_SITE_OTHER): Payer: 59

## 2020-06-06 ENCOUNTER — Ambulatory Visit: Payer: 59 | Admitting: Physician Assistant

## 2020-06-06 VITALS — BP 120/80 | HR 115 | Ht 64.0 in | Wt 285.0 lb

## 2020-06-06 DIAGNOSIS — R000 Tachycardia, unspecified: Secondary | ICD-10-CM

## 2020-06-06 DIAGNOSIS — Z6841 Body Mass Index (BMI) 40.0 and over, adult: Secondary | ICD-10-CM | POA: Diagnosis not present

## 2020-06-06 MED ORDER — METOPROLOL SUCCINATE ER 50 MG PO TB24
50.0000 mg | ORAL_TABLET | Freq: Every day | ORAL | 3 refills | Status: DC
Start: 2020-06-06 — End: 2020-06-30

## 2020-06-06 NOTE — Patient Instructions (Signed)
Medication Instructions:  1- STOP Metoprolol/HCTZ combination pill 2- START Toprol 50 mg once daily *If you need a refill on your cardiac medications before your next appointment, please call your pharmacy*   Lab Work: 1- Your physician recommends that you have lab work today(Tsh, T4, BMET)  If you have labs (blood work) drawn today and your tests are completely normal, you will receive your results only by: Marland Kitchen MyChart Message (if you have MyChart) OR . A paper copy in the mail If you have any lab test that is abnormal or we need to change your treatment, we will call you to review the results.   Testing/Procedures: 1- Echo  Please return to [facility] on ______________ at _______________ AM/PM for an Echocardiogram. Your physician has requested that you have an echocardiogram. Echocardiography is a painless test that uses sound waves to create images of your heart. It provides your doctor with information about the size and shape of your heart and how well your heart's chambers and valves are working. This procedure takes approximately one hour. There are no restrictions for this procedure. Please note; depending on visual quality an IV may need to be placed.   2- A zio monitor was placed today. It will remain on for 14 days. You will then return monitor and event diary in provided box. It takes 1-2 weeks for report to be downloaded and returned to [place]. We will call you with the results. If monitor falls of or has orange flashing light, please call Zio for further instructions.    Follow-Up: At [facility], you and your health needs are our priority.  As part of our continuing mission to provide you with exceptional heart care, we have created designated Provider Care Teams.  These Care Teams include your primary Cardiologist (physician) and Advanced Practice Providers (APPs -  Physician Assistants and Nurse Practitioners) who all work together to provide you with the care  you need, when you need it.  We recommend signing up for the patient portal called "MyChart".  Sign up information is provided on this After Visit Summary.  MyChart is used to connect with patients for Virtual Visits (Telemedicine).  Patients are able to view lab/test results, encounter notes, upcoming appointments, etc.  Non-urgent messages can be sent to your provider as well.   To learn more about what you can do with MyChart, go to [email or web address].    Your next appointment:   3 week(s)  The format for your next appointment:   In Person  Provider:     You may see Kathlyn Sacramento, MD or Marrianne Mood, PA-C    Other Instructions 1- Ref to Pulmonary for sleep study

## 2020-06-07 ENCOUNTER — Ambulatory Visit (INDEPENDENT_AMBULATORY_CARE_PROVIDER_SITE_OTHER): Payer: 59

## 2020-06-07 DIAGNOSIS — R000 Tachycardia, unspecified: Secondary | ICD-10-CM

## 2020-06-07 LAB — BASIC METABOLIC PANEL
BUN/Creatinine Ratio: 16 (ref 9–23)
BUN: 14 mg/dL (ref 6–20)
CO2: 21 mmol/L (ref 20–29)
Calcium: 9.9 mg/dL (ref 8.7–10.2)
Chloride: 102 mmol/L (ref 96–106)
Creatinine, Ser: 0.87 mg/dL (ref 0.57–1.00)
GFR calc Af Amer: 99 mL/min/{1.73_m2} (ref >59)
GFR calc non Af Amer: 86 mL/min/{1.73_m2} (ref >59)
Glucose: 96 mg/dL (ref 65–99)
Potassium: 4.5 mmol/L (ref 3.5–5.2)
Sodium: 138 mmol/L (ref 134–144)

## 2020-06-07 LAB — TSH: TSH: 1.9 u[IU]/mL (ref 0.450–4.500)

## 2020-06-07 LAB — T4, FREE: Free T4: 1.39 ng/dL (ref 0.82–1.77)

## 2020-06-07 LAB — ECHOCARDIOGRAM COMPLETE
Area-P 1/2: 4.31 cm2
S' Lateral: 2.7 cm

## 2020-06-10 ENCOUNTER — Telehealth: Payer: Self-pay | Admitting: Physician Assistant

## 2020-06-10 NOTE — Telephone Encounter (Signed)
Re: Lab reults  Arvil Chaco, PA-C  06/09/2020 11:27 PM EDT     Good news! Thyroid labs normal / within normal limits. An abnormal thyroid is not contributing to her sx. Renal function stable and electrolytes at goal, which is reassuring.  We will await the results of her echo and cardiac monitoring, as well as her sleep study.    Re: Echo results   Arvil Chaco, PA-C  06/09/2020 11:52 PM EDT     Good news!   Echo shows normal heart pump function and chamber sizes. The walls of her heart are moving normally. She has a trivial leak of her mitral / pulmonic / tricuspid valves, which is not concerning and a common finding. We can continue to monitor these valves and repeat an echo periodically based on her symptoms. She has borderline thickening of the left bottom chamber of her heart, which can happen with elevated BP, so we will just make sure we control this going forward with goal BP 130/80 or lower.   Overall, this is a very reassuring echo and good news!

## 2020-06-10 NOTE — Telephone Encounter (Signed)
Attempted to call the patient. No answer- I left a message to please call back.  Phone encounter started.

## 2020-06-13 NOTE — Telephone Encounter (Signed)
Attempted to call the patient at her home & cell #'s.  No answer- I left a message to please call back at both #'s.

## 2020-06-15 NOTE — Telephone Encounter (Signed)
The patient has been notified of the result and verbalized understanding.  All questions (if any) were answered. Jane Ivory Elise Gladden, RN 06/15/2020 1:08 PM

## 2020-06-28 ENCOUNTER — Ambulatory Visit: Payer: 59 | Admitting: Physician Assistant

## 2020-06-28 NOTE — Progress Notes (Deleted)
Office Visit    Patient Name: Jane Jackson Date of Encounter: 06/28/2020  Primary Care Provider:  Marval Regal, NP Primary Cardiologist:  Jane Sacramento, MD  Chief Complaint    No chief complaint on file.   34 yo with history of inappropriate sinus tachycardia, morbid obesity, nephrolithiasis, and GERD, and who presents for follow-up with tachypalpitations,.SOB/DOE, LEE, daily diarrhea due to cholesystectomy, and fatigue.   Past Medical History    Past Medical History:  Diagnosis Date  . Anxiety   . Asthma   . Chicken pox   . Complication of anesthesia    issue with asthma and intubation  . Cystic dysplasia of one kidney    about 4 months ago-no f/u- largest 4mm?  . Diverticulitis   . Fibroid, uterine    dx a few months ago  . GERD (gastroesophageal reflux disease)   . H/O mitral valve prolapse    as a child  . Impaction, bowel ([place])   . Inappropriate sinus tachycardia    a. exacerbated by pregnancy - 2018; b. 01/2017 Holter: 29% of time in sinus tachycardia;  c. 02/2017 Echo: EF 65-70%, no rwma; c. 01/2017 Holter: 29% of time in sinus tachycardia.  . Kidney stones    Past Surgical History:  Procedure Laterality Date  . APPENDECTOMY    . CHOLECYSTECTOMY N/A 01/18/2016   Procedure: LAPAROSCOPIC CHOLECYSTECTOMY WITH INTRAOPERATIVE CHOLANGIOGRAM;  Surgeon: Excell Seltzer, MD;  Location: [facility];  Service: General;  Laterality: N/A;  . ruptured cyst    . TONSILLECTOMY AND ADENOIDECTOMY     age 30  . TYMPANOSTOMY TUBE PLACEMENT      Allergies  Allergies  Allergen Reactions  . Latex Itching and Rash    History of Present Illness    Jane Jackson is a 34 y.o. female with PMH as above.   She was previously dx with inappropriate ST in 2018 during her pregnancy. She wore a holter monitor at that time, showing ST at 29% burden. Echo showed nl LVSF without RWMA. Sx improved slightly with Toprol XL 25mg  qd and more so after delivering. She weaned herself off of  BB tx after delivery but restarted it with recurrent episodes of tachypalpitations.   When last seen in clinic by Jane Hodgkins, NP 07/02/2018, she noted breakthrough episodes of tachypalpitations without clear triggers. BP was noted to run on the soft side at home with SBP 100-110s but 126/80 in clinic. She was trying to watch her diet and lose weight, reportedly down 5 lbs from her last visit. EKG NSR at 75bpm. Adequate hydration was discussed, activity, and weight loss. She was referred to a weight management clinic. Toprol XL was increased to 37.5mg  qd. Plan was that if palpitations persisted with stable BP, she would call the office, at which time she could increase to 50mg  qd.   On 04/27/2019, she called the clinic regarding starting phentermine for wt loss, with initiation or a trial approved by her primary cardiologist.  She reports that, since that time, she has discontinued phentermine due to a bad headache.  Since that time, it appears that she has transitioned from Toprol to metoprolol -HCTZ 50-12.5.  02/06/2020, she presented to [facility] after an acute fracture of the distal end of the right fibula due to a mechanical fall.  She reports that this occurred when she walked into a ditch and rolled her ankle.    ***Today, 06/06/2020, she returns to clinic and notes that she has not been taking her  Toprol; moreover, since her last visit, her Toprol was changed to metoprolol-HCTZ 50-12.5 with patient unable to recall at which time this transition was made; however, on review of EMR, it appears as if this change was made 03/30/2019 .  Nonetheless, she felt as if both medications made her feel fatigued, which was her reason to discontinue it several weeks before her visit. Now, however, she feels even more poorly and suspects it as due to discontinuing her BB.  Over the last month, she has felt shortness of breath with racing heart rate at rest. She reports her smart watch tracks her heart rate and has noted  rates from the 30s to 150s range.  She states that her heart rate is not always the low 30s when she is sleeping.  She has also notes inconsistent DOE without clear triggers.  She does note that some of this could be due to her asthma. She is also noted a new expiratory wheeze, which is different from her asthma.  She notes mild bilateral lower extremity edema, which she suspects may be at least somewhat dependent edema.  She describes occasionally feeling short of breath when laying down at night, but she is not certain if this is due to orthopnea or some element of obstructive sleep apnea given her body habitus.  She otherwise does not report that she has any signs or symptoms of sleep apnea, as she does not snore or stop breathing to her knowledge.   (Of note, she discontinued phentermine due to a bad headache as above).  Her fatigue, she is is drinking more caffeine at 3 to 4 cups lately, which could be contributing to her elevated rates.  She expresses frustration that, due to her fatigue, she has been unable to exercise due to low energy (and now her right fibula fracture).  She reports that she was working out 3 days/week before she fractured her right fibula (confirmed this was a mechanical fall).  No CP, presyncope, syncope.  She is aware that an element of weight and deconditioning is contributing to her current fatigue.  She wants to get the weight off; however, she notes that she easily gains weight, ever since her Depo-Provera shot and not made easy when getting baby weight off.  She tries to make healthy food choices, though sometimes does drink some of her son's juice box.  She reports that her cholecystectomy also influences her food choices (needing boost) and results in daily diarrhea.  She reports that she has increased hydration as previously recommended.  BP today 120/80 with sinus tachycardia at 115 bpm.  Weight at previous clinic 278 today 285.  Home Medications    Prior to Admission  medications   Medication Sig Start Date End Date Taking? Authorizing Provider  albuterol (ACCUNEB) 1.25 MG/3ML nebulizer solution Take 3 mLs by nebulization 2 (two) times daily as needed. Asthma. 01/03/17   Rubie Maid, MD  albuterol (PROVENTIL HFA;VENTOLIN HFA) 108 (90 Base) MCG/ACT inhaler INHALE 2 PUFFS INTO THE LUNGS EVERY 6 (SIX) HOURS AS NEEDED FOR WHEEZING OR SHORTNESS OF BREATH. 05/20/17   Rubie Maid, MD  meloxicam (MOBIC) 15 MG tablet meloxicam 15 mg tablet  Take 1 tablet every day by oral route.    [provider]  Metoprolol-Hydrochlorothiazide 50-12.5 MG TB24 metoprolol succ 50 mg-hydrochlorothiazide 12.5 mg tablet,ext.rel 24 hr    [provider]  Prenatal Vit-Fe Fumarate-FA (MULTIVITAMIN-PRENATAL) 27-0.8 MG TABS tablet Take 1 tablet by mouth daily at 12 noon.  [provider]    Review of Systems    She reports occasional shortness of breath x1 month, racing heart/palpitations rate at rest, occasional bradycardic rates, occasional dyspnea without clear triggers, occasional orthopnea-like symptoms (again without clear triggers).  She reports fatigue and mild LEE.  She reports weight gain and daily diarrhea.  She reports fatigue associated with her beta-blocker and headache associated with phentermine.  She denies chest pain, pnd, presyncope, syncope, or early satiety.   All other systems reviewed and are otherwise negative except as noted above.  Physical Exam    VS:  There were no vitals taken for this visit. , BMI There is no height or weight on file to calculate BMI. GEN: Well nourished, well developed, in no acute distress. HEENT: normal. Neck: Supple, no JVD, carotid bruits, or masses. Cardiac: Tachycardic but regular, no murmurs, rubs, or gallops. No clubbing, cyanosis, mild bilateral edema.  Radials/DP/PT 2+ and equal bilaterally.  Respiratory:  Respirations regular and unlabored, CTAB (no expiratory wheeze during the specific time of my  exam). GI: Soft, nontender, nondistended, BS + x 4. MS: no deformity or atrophy. Skin: warm and dry, no rash. Neuro:  Strength and sensation are intact. Psych: Normal affect.  Accessory Clinical Findings    ECG personally reviewed by me today - ST, 115bpm, PRi 113ms, QRS 75ms, QTC 410ms, nonspecific TW abnormality versus artifact  - no acute changes.  VITALS Reviewed today   Temp Readings from Last 3 Encounters:  02/07/20 98.2 F (36.8 C) (Oral)  12/16/17 98.4 F (36.9 C)  11/01/16 98.1 F (36.7 C) (Oral)   BP Readings from Last 3 Encounters:  06/06/20 120/80  06/02/20 95/60  02/07/20 124/64   Pulse Readings from Last 3 Encounters:  06/06/20 (!) 115  02/07/20 68  04/27/19 78    Wt Readings from Last 3 Encounters:  06/06/20 285 lb (129.3 kg)  06/02/20 285 lb (129.3 kg)  05/26/20 290 lb (131.5 kg)     LABS  reviewed today   Lab Results  Component Value Date   WBC 7.7 03/30/2019   HGB 13.3 03/30/2019   HCT 39.1 03/30/2019   MCV 88 03/30/2019   PLT 306 03/30/2019   Lab Results  Component Value Date   CREATININE 0.87 06/06/2020   BUN 14 06/06/2020   NA 138 06/06/2020   K 4.5 06/06/2020   CL 102 06/06/2020   CO2 21 06/06/2020   Lab Results  Component Value Date   ALT 61 (H) 04/27/2019   AST 28 04/27/2019   ALKPHOS 70 04/27/2019   BILITOT <0.2 04/27/2019   No results found for: CHOL, HDL, LDLCALC, LDLDIRECT, TRIG, CHOLHDL  Lab Results  Component Value Date   HGBA1C 5.6 04/27/2019   Lab Results  Component Value Date   TSH 1.900 06/06/2020     STUDIES/PROCEDURES reviewed today   02/2017 ST at 29% burden  02/28/2017 Echo - Left ventricle: The cavity size was normal. Wall thickness was  normal. Systolic function was vigorous. The estimated ejection  fraction was in the range of 65% to 70%. Wall motion was normal;  there were no regional wall motion abnormalities. Left  ventricular diastolic function parameters were normal.   Assessment &  Plan    Inappropriate sinus tachycardia -First noted during her pregnancy in 2018.  Improved following delivery and with addition of beta-blocker per previous progress notes.  At previous visits, the dose of her Toprol has been increased to control breakthrough tachypalpitations.  Since her last  visit, her Toprol has been changed to metoprolol-HCTZ.  She notes fatigue associated with BB use. Several weeks ago, she discontinued her metoprolol-HCTZ with subsequent worsening in her tachypalpitations and breathing status, LEE, and sx as detailed above.  She reports significant fatigue. --Restart Toprol and discontinue metoprolol-HCTZ in the setting of her diarrhea and recent fatigue. Retrial Toprol until echo obtained as below. If Toprol does not adequately control her symptoms, and once we obtain her EF, recommend trial of Cardizem.  Discussed LEE as a possible side effect of Cardizem. -- Given her sx above of LEE and SOB/DOE and possible orthopnea, we will obtain a repeat  Echo to reassess EF and structure, as well as heart pressures. We will also need a recent EF before starting a trial of Cardizem, as above, if Toprol continues to suboptimally control her sx and result in fatigue.  --Check a BMET to ensure renal function and electrolytes are stable with daily diarrhea and given fatigue and tachypalpitations. We will also check a CBC to rule out anemia, as well as TSH/FT4 to ensure her thyroid is not playing a role.   --Repeat 2 week cardiac monitor with Zio XT given her new sx as above with rates on smart watch sometimes in the 30s at rest and while sleeping. Also recommend obtain a sleep study. --Ongoing dietary and lifestyle changes. Reducing caffeine will help, as well as weight loss and increased activity. Unfortunately, her current right fibula fracture limits her activity and daily diarrhea influences diet.     Continue to recommend hydration with total fluids under 2 L and salt under 2 g daily.  BMI  45.0-50.0 -As above.  Discontinued phentermine due to headache.  Will check TSH/free T4.  We discussed weight management clinic referral versus referral to a nutritionist with patient politely declining, as she has not benefited from these referrals in the past.  Given her daily diarrhea, could consider GI referral per PCP.  Escalate activity as tolerated by her right fibula fracture.   Medication changes: Restart Toprol 50 mg daily as a trial and until obtaining echo Labs ordered: BMET, TSH, free T4 Studies / Imaging ordered: Echo, 2-week ZIO XT/cardiac monitoring, referral for sleep study Future considerations: Cardizem Disposition: RTC 3 weeks  Total time spent with patient today 45 minutes. This includes reviewing records, evaluating the patient, and coordinating care. Face-to-face time >50%.    Arvil Chaco, PA-C 06/28/2020

## 2020-06-30 ENCOUNTER — Other Ambulatory Visit: Payer: Self-pay

## 2020-06-30 MED ORDER — METOPROLOL SUCCINATE ER 50 MG PO TB24: 50.0000 mg | ORAL_TABLET | Freq: Every day | ORAL | 0 refills | Status: DC

## 2020-06-30 NOTE — Telephone Encounter (Signed)
*  STAT* If patient is at the pharmacy, call can be transferred to refill team.   1. Which medications need to be refilled? (please list name of each medication and dose if known) Metoprolol  2. Which pharmacy/location (including street and city if local pharmacy) is medication to be sent to? [place]   3. Do they need a 30 day or 90 day supply? [place]

## 2020-07-15 ENCOUNTER — Ambulatory Visit (INDEPENDENT_AMBULATORY_CARE_PROVIDER_SITE_OTHER): Payer: 59 | Admitting: Physician Assistant

## 2020-07-15 ENCOUNTER — Encounter: Payer: Self-pay | Admitting: Physician Assistant

## 2020-07-15 ENCOUNTER — Other Ambulatory Visit: Payer: Self-pay

## 2020-07-15 VITALS — BP 126/98 | HR 100 | Ht 64.0 in | Wt 285.0 lb

## 2020-07-15 DIAGNOSIS — R030 Elevated blood-pressure reading, without diagnosis of hypertension: Secondary | ICD-10-CM

## 2020-07-15 DIAGNOSIS — Z6841 Body Mass Index (BMI) 40.0 and over, adult: Secondary | ICD-10-CM

## 2020-07-15 DIAGNOSIS — Z79899 Other long term (current) drug therapy: Secondary | ICD-10-CM

## 2020-07-15 DIAGNOSIS — R0600 Dyspnea, unspecified: Secondary | ICD-10-CM | POA: Diagnosis not present

## 2020-07-15 DIAGNOSIS — R5383 Other fatigue: Secondary | ICD-10-CM

## 2020-07-15 DIAGNOSIS — R000 Tachycardia, unspecified: Secondary | ICD-10-CM | POA: Diagnosis not present

## 2020-07-15 MED ORDER — DILTIAZEM HCL ER COATED BEADS 120 MG PO CP24
120.0000 mg | ORAL_CAPSULE | Freq: Every day | ORAL | 3 refills | Status: DC
Start: 2020-07-15 — End: 2022-01-15

## 2020-07-15 NOTE — Patient Instructions (Signed)
Medication Instructions:  - Your physician has recommended you make the following change in your medication:   1) STOP metoprolol succinate  2) START diltiazem 120 mg- take 1 capsule by mouth once daily   *If you need a refill on your cardiac medications before your next appointment, please call your pharmacy*   Lab Work: - none ordered  If you have labs (blood work) drawn today and your tests are completely normal, you will receive your results only by: Marland Kitchen MyChart Message (if you have MyChart) OR . A paper copy in the mail If you have any lab test that is abnormal or we need to change your treatment, we will call you to review the results.   Testing/Procedures: - none ordered   Follow-Up: At [facility], you and your health needs are our priority.  As part of our continuing mission to provide you with exceptional heart care, we have created designated Provider Care Teams.  These Care Teams include your primary Cardiologist (physician) and Advanced Practice Providers (APPs -  Physician Assistants and Nurse Practitioners) who all work together to provide you with the care you need, when you need it.  We recommend signing up for the patient portal called "MyChart".  Sign up information is provided on this After Visit Summary.  MyChart is used to connect with patients for Virtual Visits (Telemedicine).  Patients are able to view lab/test results, encounter notes, upcoming appointments, etc.  Non-urgent messages can be sent to your provider as well.   To learn more about what you can do with MyChart, go to [email or web address].    Your next appointment:   1 month(s)  The format for your next appointment:   In Person  Provider:   You may see Kathlyn Sacramento, MD or one of the following Advanced Practice Providers on your designated Care Team:    Murray Hodgkins, NP  Christell Faith, PA-C  Marrianne Mood, PA-C  Cadence Kathlen Mody, [place]    Other Instructions - send a MyChart  message in about 2 weeks with how you are doing with the medication changes   - please try to get [place] a copy of your denial paperwork from you insurance company for the heart monitor so we can follow up on this

## 2020-07-15 NOTE — Progress Notes (Signed)
Office Visit    Patient Name: Jane Jackson Date of Encounter: 07/15/2020  Primary Care Provider:  Marval Regal, NP Primary Cardiologist:  Kathlyn Sacramento, MD  Chief Complaint    Chief Complaint  Patient presents with  . OTHER    3 wk fu no complaints today. Meds reviewed verbally with pt.    34 yo with history of inappropriate sinus tachycardia, morbid obesity, nephrolithiasis, and GERD, and who presents for follow-up with tachypalpitations,.SOB/DOE, LEE, daily diarrhea due to cholesystectomy, and fatigue.   Past Medical History    Past Medical History:  Diagnosis Date  . Anxiety   . Asthma   . Chicken pox   . Complication of anesthesia    issue with asthma and intubation  . Cystic dysplasia of one kidney    about 4 months ago-no f/u- largest 70mm?  . Diverticulitis   . Fibroid, uterine    dx a few months ago  . GERD (gastroesophageal reflux disease)   . H/O mitral valve prolapse    as a child  . Impaction, bowel ([place])   . Inappropriate sinus tachycardia    a. exacerbated by pregnancy - 2018; b. 01/2017 Holter: 29% of time in sinus tachycardia;  c. 02/2017 Echo: EF 65-70%, no rwma; c. 01/2017 Holter: 29% of time in sinus tachycardia.  . Kidney stones    Past Surgical History:  Procedure Laterality Date  . APPENDECTOMY    . CHOLECYSTECTOMY N/A 01/18/2016   Procedure: LAPAROSCOPIC CHOLECYSTECTOMY WITH INTRAOPERATIVE CHOLANGIOGRAM;  Surgeon: Excell Seltzer, MD;  Location: [facility];  Service: General;  Laterality: N/A;  . ruptured cyst    . TONSILLECTOMY AND ADENOIDECTOMY     age 25  . TYMPANOSTOMY TUBE PLACEMENT      Allergies  Allergies  Allergen Reactions  . Latex Itching and Rash    History of Present Illness    Jane Jackson is a 34 y.o. female with PMH as above.   She was previously dx with inappropriate ST in 2018 during her pregnancy. She wore a holter monitor at that time, showing ST at 29% burden. Echo showed nl LVSF without RWMA. Sx improved  slightly with Toprol XL 25mg  qd and more so after delivering. She weaned herself off of BB tx after delivery but restarted it with recurrent episodes of tachypalpitations.   When last seen in clinic by Murray Hodgkins, NP 07/02/2018, she noted breakthrough episodes of tachypalpitations without clear triggers. BP was noted to run on the soft side at home with SBP 100-110s but 126/80 in clinic. She was trying to watch her diet and lose weight, reportedly down 5 lbs from her last visit. EKG NSR at 75bpm. Adequate hydration was discussed, activity, and weight loss. She was referred to a weight management clinic. Toprol XL was increased to 37.5mg  qd. Plan was that if palpitations persisted with stable BP, she would call the office, at which time she could increase to 50mg  qd.   On 04/27/2019, she called the clinic regarding starting phentermine for wt loss, with initiation or a trial approved by her primary cardiologist.  She reports that, since that time, she has discontinued phentermine due to a bad headache.  Since that time, it appears that she has transitioned from Toprol to metoprolol -HCTZ 50-12.5.  02/06/2020, she presented to [facility] after an acute fracture of the distal end of the right fibula due to a mechanical fall.  She reports that this occurred when she walked into a ditch and rolled her  ankle.    Seen 06/06/2020 and had not been taking Toprol, as she felt fatigue with it, even when it changed to metoprolol-HCTZ 50-12.5 with patient unable to recall at which time this transition was made (? 03/30/2019 per EMR).  Since stopping her BB, she noted inconsistent DOE/SOB and racing HR. Smart watch rates reviewed and 30s to 150s range (HR 30s when sleeping).  She noted new expiratory wheeze, different from her asthma. She also had mild bilateral lower extremity edema, suspected at least in part dependent.  She occasionally felt SOB when laying down at night, but she was uncertain if this was orthopnea or some  element of obstructive sleep apnea given her body habitus.  She did not snore or stop breathing to her knowledge.  She was drinking more caffeine at 3 to 4 cups. She was unable to exercise due to low energy (and right fibula fracture).  She used to work out 3 days/week before she fractured her right fibula (confirmed this was a mechanical fall).  She wanted to get her weight off; however, she noted that she easily gained weight, ever since her Depo-Provera shot and not made easy when getting baby weight off.  She tried to make healthy food choices, though sometimes did drink some of her son's juice box.  She reported that her cholecystectomy also influenced her food choices (needing boost) and results in daily diarrhea.  She had increased hydration as previously recommended.   She was restarted on Toprol 50mg  dialy. Also ordered were labs (resulted wnl), echo, 2-week ZIO XT/cardiac monitoring, and a referral for sleep study  Today, 07/15/20, she returns to clinic and reports she is feeling poorly and very fatigued. No CP. Racing HR improved with restart of BB. No presyncope, syncope, falls. She still cannot workout 2/2 her foot in a boot due to her R fibular fracture. Cardiac monitoring reviewed in great detail and NSR with PAC/PVCs. She reports she is usually up all night long, as she is gotten used to sleeping on a different cycle after working third shift in the past. Long discussion regarding her cardiac monitoring, as her first initial monitor fell off/did not adhere properly. During the date of her echocardiogram, a second monitor was placed. She reports today that neither monitor is being covered by her insurance program, which states that the [place] monitoring is out of network. Long discussion regarding possible solutions and ways in which our clinic could help. She reports feeling very fatigued and frustrated, as she continues to have allegations between work and taking care of Mitzi Hansen, her 31-year-old son,  who also does not sleep well. She reports ongoing mild lower extremity edema, mainly in her ankles, and attributed to dependent edema from time spent sitting. She has noted that her diastolic blood pressure is creeping up, which is concerning to her. She is still drinking approximately 3 to 4 cups of coffee per day, and she appreciates that we continue to monitor this for her. She reports that she recently learned of splenic enlargement, which is being monitored. She reports they also scanned her thyroid at that time. She states that, once all of her health issues get sorted out, she intends to get the coronavirus vaccine. No signs or symptoms of bleeding. She really hopes to get energy and be able to workout and lose weight soon.  Home Medications    Prior to Admission medications   Medication Sig Start Date End Date Taking? Authorizing Provider  albuterol (ACCUNEB) 1.25 MG/3ML nebulizer  solution Take 3 mLs by nebulization 2 (two) times daily as needed. Asthma. 01/03/17   Rubie Maid, MD  albuterol (PROVENTIL HFA;VENTOLIN HFA) 108 (90 Base) MCG/ACT inhaler INHALE 2 PUFFS INTO THE LUNGS EVERY 6 (SIX) HOURS AS NEEDED FOR WHEEZING OR SHORTNESS OF BREATH. 05/20/17   Rubie Maid, MD  meloxicam (MOBIC) 15 MG tablet meloxicam 15 mg tablet  Take 1 tablet every day by oral route.    [provider]  Metoprolol-Hydrochlorothiazide 50-12.5 MG TB24 metoprolol succ 50 mg-hydrochlorothiazide 12.5 mg tablet,ext.rel 24 hr    [provider]  Prenatal Vit-Fe Fumarate-FA (MULTIVITAMIN-PRENATAL) 27-0.8 MG TABS tablet Take 1 tablet by mouth daily at 12 noon.    [provider]    Review of Systems    She reports extreme fatigue, ongoing/unchanged chronic dyspnea, and mild dependent/nonpitting lower extremity edema. She denies chest pain, palpitations, pnd, orthopnea, n, v, dizziness, syncope, weight gain, or early satiety. all other systems reviewed and are otherwise negative except as  noted above.  Physical Exam    VS:  BP (!) 126/98 (BP Location: Right Arm, Patient Position: Sitting, Cuff Size: Large)   Pulse 100   Ht 5\' 4"  (1.626 m)   Wt 285 lb (129.3 kg)   SpO2 99%   BMI 48.92 kg/m  , BMI Body mass index is 48.92 kg/m. GEN: Well nourished, well developed, in no acute distress. HEENT: normal. Neck: Supple, no JVD, carotid bruits, or masses. Cardiac: regular and tachycardic, no murmurs, rubs, or gallops. No clubbing, cyanosis, no significant edema (LLE as RLE in boot).  Radials/DP/PT 2+ and equal bilaterally.  Respiratory:  Respirations regular and unlabored, CTAB  GI: Soft, nontender, nondistended, BS + x 4. MS: no deformity or atrophy. R leg boot. Skin: warm and dry, no rash. Neuro:  Strength and sensation are intact. Psych: Normal affect.  Accessory Clinical Findings    ECG personally reviewed by me today - ST, 100bpm, PRi 166ms, QRS 8ms, QTC 3110ms, nonspecific TW abnormality versus artifact  - no acute changes.  VITALS Reviewed today   Temp Readings from Last 3 Encounters:  02/07/20 98.2 F (36.8 C) (Oral)  12/16/17 98.4 F (36.9 C)  11/01/16 98.1 F (36.7 C) (Oral)   BP Readings from Last 3 Encounters:  07/15/20 (!) 126/98  06/06/20 120/80  06/02/20 95/60   Pulse Readings from Last 3 Encounters:  07/15/20 100  06/06/20 (!) 115  02/07/20 68    Wt Readings from Last 3 Encounters:  07/15/20 285 lb (129.3 kg)  06/06/20 285 lb (129.3 kg)  06/02/20 285 lb (129.3 kg)     LABS  reviewed today   Lab Results  Component Value Date   WBC 7.7 03/30/2019   HGB 13.3 03/30/2019   HCT 39.1 03/30/2019   MCV 88 03/30/2019   PLT 306 03/30/2019   Lab Results  Component Value Date   CREATININE 0.87 06/06/2020   BUN 14 06/06/2020   NA 138 06/06/2020   K 4.5 06/06/2020   CL 102 06/06/2020   CO2 21 06/06/2020   Lab Results  Component Value Date   ALT 61 (H) 04/27/2019   AST 28 04/27/2019   ALKPHOS 70 04/27/2019   BILITOT <0.2 04/27/2019     No results found for: CHOL, HDL, LDLCALC, LDLDIRECT, TRIG, CHOLHDL  Lab Results  Component Value Date   HGBA1C 5.6 04/27/2019   Lab Results  Component Value Date   TSH 1.900 06/06/2020     STUDIES/PROCEDURES reviewed today   Echo  06/07/20 1. Left ventricular ejection fraction, by estimation, is 55 to 60%. The  left ventricle has normal function. The left ventricle has no regional  wall motion abnormalities. Left ventricular diastolic parameters were  normal.  2. Right ventricular systolic function is normal. The right ventricular  size is normal. Tricuspid regurgitation signal is inadequate for assessing  PA pressure.  3. The mitral valve is normal in structure. Trivial mitral valve  regurgitation.  4. The aortic valve was not well visualized. Aortic valve regurgitation  is not visualized. No aortic stenosis is present.   06/06/20 See scan. Pending official read.  Patient had a min HR of 48 bpm, max HR of 162 bpm, and avg HR of 76 bpm. Predominant underlying rhythm was Sinus Rhythm. Isolated SVEs were rare (<1.0%), and no SVE Couplets or SVE Triplets were present. Isolated VEs were rare (<1.0%), and no VE Couplets or VE Triplets were present.  02/2017 ST at 29% burden  02/28/2017 Echo - Left ventricle: The cavity size was normal. Wall thickness was  normal. Systolic function was vigorous. The estimated ejection  fraction was in the range of 65% to 70%. Wall motion was normal;  there were no regional wall motion abnormalities. Left  ventricular diastolic function parameters were normal.   Assessment & Plan    Inappropriate sinus tachycardia -First noted during her pregnancy in 2018.  Improved following delivery and with addition of beta-blocker per previous progress notes. She notes fatigue associated with BB use. At previous visit, Toprol was restarted with plan to transition her to Cardizem once echo as above showed nl EF.  Echo as above obtained with nl EF  and Zio XT as above with NSR and rare PAC/PVCs. Discussed LEE as a possible side effect of Cardizem. She is agreeable to start low dose Cardizem today and to be uptitrated as needed for sx control. -- Stop Toprol, given it makes her feel poorly. -- Start Cardizem 120mg  daily.  -- Monitor home BP and HR.  -- She will send a MyChart msg in 2 weeks to let me know how she feels on this medication with log of HR/BP 2 hours after her medication. She plans to take her Cardizem each evening at night. She knows her Cardizem may be up-titrated between visits if needed depending on her updated vitals and sx.  -- Ongoing dietary and lifestyle changes. Reducing caffeine will help, as well as weight loss and increased activity. Unfortunately, her current right fibula fracture limits her activity and daily diarrhea influences diet.     Continue to recommend hydration with total fluids under 2 L and salt under 2 g daily.  Elevated diastolic BP --Discussed BP with recommendation for BP 130/80 or lower. She will monitor at home and s/p start of Cardizem 120mg  as above. Low salt diet and exercise discussed.   BMI 45.0-50.0 -As above.  Discontinued phentermine due to headache.  TSH/FT4 wnl.  Escalate activity as tolerated by her right fibula fracture and energy.   Medication changes: Stop Toprol 50 mg daily. Start Cardizem 120mg  qd. Labs ordered: None Studies / Imaging ordered: None. Future considerations: Titration of Cardizem Disposition: RTC 4 weeks   Arvil Chaco, PA-C 07/15/2020

## 2020-07-18 NOTE — Progress Notes (Signed)
Please call this pt and advise a CPE in-person OV in November.

## 2020-07-19 ENCOUNTER — Telehealth: Payer: Self-pay

## 2020-07-19 NOTE — Progress Notes (Signed)
LMTCB to schedule appt

## 2020-07-19 NOTE — Telephone Encounter (Signed)
-----   Message from Marval Regal, NP sent at 07/18/2020  6:57 AM EDT -----   ----- Message ----- From: Arvil Chaco, PA-C Sent: 07/16/2020   1:48 PM EDT To: Marval Regal, NP

## 2020-07-19 NOTE — Telephone Encounter (Signed)
Patient due for CPE in nov; LMTCB to schedule

## 2020-07-20 NOTE — Telephone Encounter (Signed)
LMTCB to schedule CPE

## 2020-07-21 NOTE — Telephone Encounter (Signed)
LMTCB to schedule CPE; also letter sent to patient 

## 2020-08-10 ENCOUNTER — Telehealth: Payer: Self-pay | Admitting: *Deleted

## 2020-08-10 NOTE — Telephone Encounter (Signed)
Our office received a fax from [facility] with an "open no charge request" for pt's Zio monitor. Serial Number [id number]. This monitor did not stay on and pt returned. Pt has already had a second monitor sent to her. Notified Z[employer] representative NOT to bill this one d/t insufficient.

## 2020-08-15 ENCOUNTER — Ambulatory Visit: Payer: 59 | Admitting: Physician Assistant

## 2020-09-02 ENCOUNTER — Ambulatory Visit: Payer: 59 | Admitting: Physician Assistant

## 2021-09-24 NOTE — L&D Delivery Note (Signed)
[facility]N [facility] Delivery Note  Jane Jackson is a 36 y.o. G3P1011 s/p VAVD at [redacted]w[redacted]d She was admitted for IOL for gHTN and GDMA2.   ROM: 7h 578mith clear fluid GBS Status:  Negative/-- (10/02 1648) Maximum Maternal Temperature:  Temp (24hrs), Avg:98.2 F (36.8 C), Min:98.1 F (36.7 C), Max:98.4 F (36.9 C)    Labor Progress: Patient arrived at 0 cm dilation and was induced with cytotec, AROM, FB, pitocin.   Delivery Date/Time: 07/06/2022 at 0132 Delivery: Called to room and patient was complete and pushing. Fetal station at plus 1-2 with little movement with pushes. Attending called to the patients room for evaluation. Patient consented for vacuum assisted vaginal delivery. Bell applied to occiput and gentle traction was applied while patient pushed with contraction. Delivery of the head with one push and no pop offs. Head delivered in ROA position. No nuchal cord present. Shoulder and body delivered in usual fashion. Infant with spontaneous cry, placed on mother's abdomen, dried and stimulated. Cord clamped x 2 after 1-minute delay, and cut by FOB. Cord blood drawn. Placenta delivered spontaneously with gentle cord traction. Fundus firm with massage and Pitocin. Labia, perineum, vagina, and cervix inspected with 1st degree perineal laceration (repaired) and right periurethral laceration which was hemostatic.   Placenta: spontaneous, intact, 3 vessel cord  Complications: None  Lacerations: 1st degree perineal repaired and hemostatic periurethral  EBL: 162 Analgesia: epidural    Infant: APGAR (1 MIN): 8   APGAR (5 MINS): 9   APGAR (10 MINS):    Weight: Pending   ViGifford ShaveMD  07/06/2022 2:58 AM

## 2021-11-20 ENCOUNTER — Other Ambulatory Visit: Payer: Self-pay

## 2021-11-20 ENCOUNTER — Ambulatory Visit (LOCAL_COMMUNITY_HEALTH_CENTER): Payer: Self-pay

## 2021-11-20 VITALS — BP 134/89 | Ht 64.5 in | Wt 303.5 lb

## 2021-11-20 DIAGNOSIS — Z3201 Encounter for pregnancy test, result positive: Secondary | ICD-10-CM

## 2021-11-20 LAB — PREGNANCY, URINE: Preg Test, Ur: POSITIVE — AB

## 2021-11-20 MED ORDER — PRENATAL 27-0.8 MG PO TABS: 1.0000 | ORAL_TABLET | Freq: Every day | ORAL | 0 refills | Status: AC

## 2021-11-20 NOTE — Progress Notes (Signed)
UPT positive. Plans prenatal care at [facility] and has appt 12/13/21. Encouraged to establish care early. To DSS for medicaid/preg women. Josie Saunders, RN

## 2021-12-11 ENCOUNTER — Encounter: Payer: Self-pay | Admitting: Certified Nurse Midwife

## 2021-12-11 ENCOUNTER — Other Ambulatory Visit: Payer: Self-pay

## 2021-12-11 ENCOUNTER — Ambulatory Visit (INDEPENDENT_AMBULATORY_CARE_PROVIDER_SITE_OTHER): Payer: 59 | Admitting: Certified Nurse Midwife

## 2021-12-11 VITALS — BP 137/95 | HR 109 | Ht 65.0 in | Wt 304.6 lb

## 2021-12-11 DIAGNOSIS — Z3200 Encounter for pregnancy test, result unknown: Secondary | ICD-10-CM | POA: Diagnosis not present

## 2021-12-11 LAB — POCT URINE PREGNANCY: Preg Test, Ur: POSITIVE — AB

## 2021-12-11 MED ORDER — DOXYLAMINE-PYRIDOXINE 10-10 MG PO TBEC: 1.0000 | DELAYED_RELEASE_TABLET | Freq: Four times a day (QID) | ORAL | 5 refills | Status: DC

## 2021-12-11 NOTE — Patient Instructions (Signed)
Prenatal Care ?Prenatal care is health care during pregnancy. It helps you and your unborn baby (fetus) stay as healthy as possible. Prenatal care may be provided by a midwife, a family practice doctor, a mid-level practitioner (nurse practitioner or physician assistant), or a childbirth and pregnancy doctor (obstetrician). ?How does this affect me? ?During pregnancy, you will be closely monitored for any new conditions that might develop. To lower your risk of pregnancy complications, you and your health care provider will talk about any underlying conditions you have. ?How does this affect my baby? ?Early and consistent prenatal care increases the chance that your baby will be healthy during pregnancy. Prenatal care lowers the risk that your baby will be: ?Born early (prematurely). ?Smaller than expected at birth (small for gestational age). ?What can I expect at the first prenatal care visit? ?Your first prenatal care visit will likely be the longest. You should schedule your first prenatal care visit as soon as you know that you are pregnant. Your first visit is a good time to talk about any questions or concerns you have about pregnancy. ?Medical history ?At your visit, you and your health care provider will talk about your medical history, including: ?Any past pregnancies. ?Your family's medical history. ?Medical history of the baby's father. ?Any long-term (chronic) health conditions you have and how you manage them. ?Any surgeries or procedures you have had. ?Any current over-the-counter or prescription medicines, herbs, or supplements that you are taking. ?Other factors that could pose a risk to your baby, including: ?Exposure to harmful chemicals or radiation at work or at home. ?Any substance use, including tobacco, alcohol, and drug use. ?Your home setting and your stress levels, including: ?Exposure to abuse or violence. ?Household financial strain. ?Your daily health habits, including diet and  exercise. ?Tests and screenings ?Your health care provider will: ?Measure your weight, height, and blood pressure. ?Do a physical exam, including a pelvic and breast exam. ?Perform blood tests and urine tests to check for: ?Urinary tract infection. ?Sexually transmitted infections (STIs). ?Low iron levels in your blood (anemia). ?Blood type and certain proteins on red blood cells (Rh antibodies). ?Infections and immunity to viruses, such as hepatitis B and rubella. ?HIV (human immunodeficiency virus). ?Discuss your options for genetic screening. ?Tips about staying healthy ?Your health care provider will also give you information about how to keep yourself and your baby healthy, including: ?Nutrition and taking vitamins. ?Physical activity. ?How to manage pregnancy symptoms such as nausea and vomiting (morning sickness). ?Infections and substances that may be harmful to your baby and how to avoid them. ?Food safety. ?Dental care. ?Working. ?Travel. ?Warning signs to watch for and when to call your health care provider. ?How often will I have prenatal care visits? ?After your first prenatal care visit, you will have regular visits throughout your pregnancy. The visit schedule is often as follows: ?Up to week 28 of pregnancy: once every 4 weeks. ?28-36 weeks: once every 2 weeks. ?After 36 weeks: every week until delivery. ?Some women may have visits more or less often depending on any underlying health conditions and the health of the baby. ?Keep all follow-up and prenatal care visits. This is important. ?What happens during routine prenatal care visits? ?Your health care provider will: ?Measure your weight and blood pressure. ?Check for fetal heart sounds. ?Measure the height of your uterus in your abdomen (fundal height). This may be measured starting around week 20 of pregnancy. ?Check the position of your baby inside your uterus. ?Ask questions   about your diet, sleeping patterns, and whether you can feel the baby  move. ?Review warning signs to watch for and signs of labor. ?Ask about any pregnancy symptoms you are having and how you are dealing with them. Symptoms may include: ?Headaches. ?Nausea and vomiting. ?Vaginal discharge. ?Swelling. ?Fatigue. ?Constipation. ?Changes in your vision. ?Feeling persistently sad or anxious. ?Any discomfort, including back or pelvic pain. ?Bleeding or spotting. ?Make a list of questions to ask your health care provider at your routine visits. ?What tests might I have during prenatal care visits? ?You may have blood, urine, and imaging tests throughout your pregnancy, such as: ?Urine tests to check for glucose, protein, or signs of infection. ?Glucose tests to check for a form of diabetes that can develop during pregnancy (gestational diabetes mellitus). This is usually done around week 24 of pregnancy. ?Ultrasounds to check your baby's growth and development, to check for birth defects, and to check your baby's well-being. These can also help to decide when you should deliver your baby. ?A test to check for group B strep (GBS) infection. This is usually done around week 36 of pregnancy. ?Genetic testing. This may include blood, fluid, or tissue sampling, or imaging tests, such as an ultrasound. Some genetic tests are done during the first trimester and some are done during the second trimester. ?What else can I expect during prenatal care visits? ?Your health care provider may recommend getting certain vaccines during pregnancy. These may include: ?A yearly flu shot (annual influenza vaccine). This is especially important if you will be pregnant during flu season. ?Tdap (tetanus, diphtheria, pertussis) vaccine. Getting this vaccine during pregnancy can protect your baby from whooping cough (pertussis) after birth. This vaccine may be recommended between weeks 27 and 36 of pregnancy. ?A COVID-19 vaccine. ?Later in your pregnancy, your health care provider may give you information  about: ?Childbirth and breastfeeding classes. ?Choosing a health care provider for your baby. ?Umbilical cord banking. ?Breastfeeding. ?Birth control after your baby is born. ?The hospital labor and delivery unit and how to set up a tour. ?Registering at the hospital before you go into labor. ?Where to find more information ?Office on Women's Health: womenshealth.gov ?American Pregnancy Association: americanpregnancy.org ?March of Dimes: marchofdimes.org ?Summary ?Prenatal care helps you and your baby stay as healthy as possible during pregnancy. ?Your first prenatal care visit will most likely be the longest. ?You will have visits and tests throughout your pregnancy to monitor your health and your baby's health. ?Bring a list of questions to your visits to ask your health care provider. ?Make sure to keep all follow-up and prenatal care visits. ?This information is not intended to replace advice given to you by your health care provider. Make sure you discuss any questions you have with your health care provider. ?Document Revised: 06/23/2020 Document Reviewed: 06/23/2020 ?Elsevier Patient Education ? 2022 [employer] ? ?

## 2021-12-11 NOTE — Progress Notes (Signed)
Subjective:  ? ? Jane Jackson is a 36 y.o. female who presents for evaluation of amenorrhea. She believes she could be pregnant. Pregnancy is desired. Sexual Activity: single partner, contraception: none. Current symptoms also include: nausea. Last period was abnormal. ?  ?Patient's last menstrual period was 10/15/2021. ?The following portions of the patient's history were reviewed and updated as appropriate: allergies, current medications, past family history, past medical history, past social history, past surgical history, and problem list. ? ?Review of Systems ?Pertinent items are noted in HPI.   ?  ?Objective:  ? ? BP (!) 137/95   Pulse (!) 109   Ht '5\' 5"'$  (1.651 m)   Wt (!) 304 lb 9.6 oz (138.2 kg)   LMP 10/15/2021   BMI 50.69 kg/m?  ?General: alert, cooperative, appears stated age, mild distress, and no acute distress   ? ?Lab Review ?Urine HCG: positive  ?  ?Assessment:  ? ? Absence of menstruation.   ?  ?Plan:  ? ? Pregnancy Test:  Positive: EDC: 08/15/22. Briefly discussed pre-natal care options. BP elevated today. Pt to return fro BP check this week. Encouraged well-balanced diet, plenty of rest when needed, pre-natal vitamins daily and walking for exercise. Discussed self-help for nausea, avoiding OTC medications until consulting provider or pharmacist, other than Tylenol as needed, minimal caffeine (1-2 cups daily) and avoiding alcohol. She will schedule u/s for dating, nurse visti 2 wks  her initial OB visit @ 12 wks . Orders placed for [facility]. Feel free to call with any questions. Discussed aneasthsia consult and asa due to BMI. She verbalizes understanding.  ? ?Philip Aspen, CNM  ?

## 2021-12-13 ENCOUNTER — Other Ambulatory Visit: Payer: Self-pay

## 2021-12-13 ENCOUNTER — Ambulatory Visit (INDEPENDENT_AMBULATORY_CARE_PROVIDER_SITE_OTHER): Payer: 59

## 2021-12-13 ENCOUNTER — Ambulatory Visit (INDEPENDENT_AMBULATORY_CARE_PROVIDER_SITE_OTHER): Payer: 59 | Admitting: *Deleted

## 2021-12-13 VITALS — BP 128/83 | HR 88 | Wt 304.0 lb

## 2021-12-13 DIAGNOSIS — O0990 Supervision of high risk pregnancy, unspecified, unspecified trimester: Secondary | ICD-10-CM | POA: Insufficient documentation

## 2021-12-13 DIAGNOSIS — O3680X0 Pregnancy with inconclusive fetal viability, not applicable or unspecified: Secondary | ICD-10-CM

## 2021-12-13 NOTE — Progress Notes (Cosign Needed)
New OB Intake ? ?I explained I am completing New OB Intake today. We discussed her EDD of 07/22/22 that is based on LMP of 10/15/21. Pt is G3/P1. I reviewed her allergies, medications, Medical/Surgical/OB history, and appropriate screenings.  .  ? ?Patient Active Problem List  ? Diagnosis Date Noted  ? Supervision of high risk pregnancy, antepartum 12/13/2021  ? Uncomplicated asthma 11/57/2620  ? Obesity affecting pregnancy in first trimester 01/14/2016  ? ? ?Concerns addressed today ? ?Delivery Plans:  ?Plans to deliver at [facility].  ? ?Blood Pressure Cuff  ?Given to pt from office. ?  ? ? ?Anatomy US ?Explained first scheduled [place] will be around 19 weeks.  ? ?Labs ?Discussed Johnsie Cancel genetic screening with patient.  Routine prenatal labs needed. ? ?Placed OB Box on problem list and updated ? ? ?Patient informed that the ultrasound is considered a limited obstetric ultrasound and is not intended to be a complete ultrasound exam.  Patient also informed that the ultrasound is not being completed with the intent of assessing for fetal or placental anomalies or any pelvic abnormalities. Explained that the purpose of today's ultrasound is to assess for dating and fetal heart rate.  Patient acknowledges the purpose of the exam and the limitations of the study.    ? ? First visit review ?I reviewed new OB appt with pt.  ? ? ?Crosby Oyster, RN ?12/13/2021  4:11 PM  ?

## 2021-12-18 ENCOUNTER — Ambulatory Visit: Payer: 59

## 2021-12-18 ENCOUNTER — Other Ambulatory Visit: Payer: 59

## 2022-01-02 ENCOUNTER — Encounter: Payer: Medicaid Other | Admitting: Certified Nurse Midwife

## 2022-01-08 ENCOUNTER — Encounter: Payer: 59 | Admitting: Certified Nurse Midwife

## 2022-01-15 ENCOUNTER — Encounter: Payer: Self-pay | Admitting: Family Medicine

## 2022-01-15 ENCOUNTER — Other Ambulatory Visit (HOSPITAL_COMMUNITY)
Admission: RE | Admit: 2022-01-15 | Discharge: 2022-01-15 | Disposition: A | Discharge status: Home / Self Care | Payer: 59 | Source: Ambulatory Visit | Attending: Family Medicine | Admitting: Family Medicine

## 2022-01-15 ENCOUNTER — Ambulatory Visit (INDEPENDENT_AMBULATORY_CARE_PROVIDER_SITE_OTHER): Payer: 59 | Admitting: Family Medicine

## 2022-01-15 VITALS — BP 122/85 | HR 101 | Wt 306.0 lb

## 2022-01-15 DIAGNOSIS — O219 Vomiting of pregnancy, unspecified: Secondary | ICD-10-CM

## 2022-01-15 DIAGNOSIS — Z3A13 13 weeks gestation of pregnancy: Secondary | ICD-10-CM

## 2022-01-15 DIAGNOSIS — R7303 Prediabetes: Secondary | ICD-10-CM

## 2022-01-15 DIAGNOSIS — O161 Unspecified maternal hypertension, first trimester: Secondary | ICD-10-CM

## 2022-01-15 DIAGNOSIS — Z124 Encounter for screening for malignant neoplasm of cervix: Secondary | ICD-10-CM | POA: Diagnosis not present

## 2022-01-15 DIAGNOSIS — O0991 Supervision of high risk pregnancy, unspecified, first trimester: Secondary | ICD-10-CM

## 2022-01-15 DIAGNOSIS — J4520 Mild intermittent asthma, uncomplicated: Secondary | ICD-10-CM

## 2022-01-15 DIAGNOSIS — O0990 Supervision of high risk pregnancy, unspecified, unspecified trimester: Secondary | ICD-10-CM

## 2022-01-15 DIAGNOSIS — O99211 Obesity complicating pregnancy, first trimester: Secondary | ICD-10-CM

## 2022-01-15 MED ORDER — ONDANSETRON 4 MG PO TBDP: 4.0000 mg | ORAL_TABLET | Freq: Four times a day (QID) | ORAL | 1 refills | Status: DC | PRN

## 2022-01-15 NOTE — Progress Notes (Signed)
Pain in right lower abdomen/ovarian pain  ? ?Having a lot of nausea  ?

## 2022-01-15 NOTE — Progress Notes (Signed)
? ?  ? ?Subjective:  ? ?Jane Jackson is a 36 y.o. G3P1011 at 36w1dby LMP, early ultrasound being seen today for her first obstetrical visit.  Her obstetrical history is significant for advanced maternal age. Patient does intend to breast feed. Pregnancy history fully reviewed. ? ?Patient reports nausea and vomiting. ? ?HISTORY: ?OB History  ?Gravida Para Term Preterm AB Living  ?'3 1 1 '$ 0 1 1  ?SAB IAB Ectopic Multiple Live Births  ?1 0 0 0 1  ?  ?# Outcome Date GA Lbr Len/2nd Weight Sex Delivery Anes PTL Lv  ?3 Current           ?2 Term 03/13/17 314w5d4:30 / 00:47 6 lb 4.5 oz (2.85 kg) M Vag-Spont EPI  LIV  ?   Name: FRELLYSE, ROTOLOESSICA  ?   Apgar1: 6  Apgar5: 8  ?1 SAB           ? ?Past Medical History:  ?Diagnosis Date  ? Anxiety   ? Asthma   ? Chicken pox   ? Complication of anesthesia   ? issue with asthma and intubation  ? Cystic dysplasia of one kidney   ? about 4 months ago-no f/u- largest 59m59m ? Diverticulitis   ? Family history of cystic fibrosis   ? Normal CF screen  ? Family history of mental retardation   ? Fibroid, uterine   ? dx a few months ago  ? GERD (gastroesophageal reflux disease)   ? H/O mitral valve prolapse   ? as a child  ? History of mitral valve prolapse 09/16/2016  ? Impaction, bowel (HC[place] ? Inappropriate sinus tachycardia   ? a. exacerbated by pregnancy - 2018; b. 01/2017 Holter: 29% of time in sinus tachycardia;  c. 02/2017 Echo: EF 65-70%, no rwma; c. 01/2017 Holter: 29% of time in sinus tachycardia.  ? Kidney stones   ? Upper respiratory tract infection 05/27/2020  ? ?Past Surgical History:  ?Procedure Laterality Date  ? APPENDECTOMY    ? CHOLECYSTECTOMY N/A 01/18/2016  ? Procedure: LAPAROSCOPIC CHOLECYSTECTOMY WITH INTRAOPERATIVE CHOLANGIOGRAM;  Surgeon: BenExcell SeltzerD;  Location: [facility];  Service: General;  Laterality: N/A;  ? ruptured cyst    ? TONSILLECTOMY AND ADENOIDECTOMY    ? age 53  32 TYMPANOSTOMY TUBE PLACEMENT    ? ?Family History  ?Problem Relation Age of Onset  ?  Arthritis Mother   ? Hypertension Mother   ? Diabetes Mother   ? Cervical cancer Mother   ? Alcohol abuse Father   ? Arthritis Father   ? Hypertension Father   ? Arthritis Maternal Grandmother   ? Heart disease Maternal Grandmother   ? Stroke Maternal Grandmother   ? Hypertension Maternal Grandmother   ? Diabetes Maternal Grandmother   ? Arthritis Maternal Grandfather   ? Colon cancer Maternal Grandfather   ? Prostate cancer Maternal Grandfather   ? Heart disease Maternal Grandfather   ? Stroke Maternal Grandfather   ? Hypertension Maternal Grandfather   ? Arthritis Paternal Grandmother   ? Heart disease Paternal Grandmother   ? Hypertension Paternal Grandmother   ? Diabetes Paternal Grandmother   ? Arthritis Paternal Grandfather   ? Heart disease Paternal Grandfather   ? Hypertension Paternal Grandfather   ? Thyroid disease Maternal Aunt   ?     thyroid cancer  ? ?Social History  ? ?Tobacco Use  ? Smoking status: Former  ?  Types: Cigarettes  ? Smokeless tobacco: Never  ?  Tobacco comments:  ?  Smoked occ, no smoking for years, per pt  ?Vaping Use  ? Vaping Use: Never used  ?Substance Use Topics  ? Alcohol use: Not Currently  ?  Comment: last use 10/28/2021 "one drink"  ? Drug use: No  ? ?Allergies  ?Allergen Reactions  ? Latex Itching and Rash  ? Fentanyl Hives  ? ?Current Outpatient Medications on File Prior to Visit  ?Medication Sig Dispense Refill  ? albuterol (VENTOLIN HFA) 108 (90 Base) MCG/ACT inhaler Inhale into the lungs.    ? Prenatal Vit-Fe Fumarate-FA (MULTIVITAMIN-PRENATAL) 27-0.8 MG TABS tablet Take 1 tablet by mouth daily at 12 noon. 100 tablet 0  ? Doxylamine-Pyridoxine 10-10 MG TBEC Take 1 tablet by mouth 4 (four) times daily. Day 1 &2: 2 tablet at bedtimeDay 3 : if symptoms persists 1 tablet am; 2 tablet at bedtimeDay 4: 1 tablet am, 1 tab afternoon, 2 tab at bedtime (Patient not taking: Reported on 01/15/2022) 120 tablet 5  ? ?No current facility-administered medications on file prior to visit.   ? ? ? ?Exam  ? ?Vitals:  ? 01/15/22 1515  ?BP: 122/85  ?Pulse: (!) 101  ?Weight: (!) 306 lb (138.8 kg)  ? ?Fetal Heart Rate (bpm): 154 ? ?Uterus:   14 week size  ?Pelvic Exam: Perineum: no hemorrhoids, normal perineum  ? Vulva: normal external genitalia, no lesions  ? Vagina:  normal mucosa, normal discharge  ? Cervix: no lesions and normal, pap smear done.   ? Adnexa: normal adnexa and no mass, fullness, tenderness  ? Bony Pelvis: average  ?System: General: well-developed, well-nourished female in no acute distress  ? Skin: normal coloration and turgor, no rashes  ? Neurologic: oriented, normal, negative, normal mood  ? Extremities: normal strength, tone, and muscle mass, ROM of all joints is normal  ? HEENT PERRLA, extraocular movement intact and sclera clear, anicteric  ? Mouth/Teeth mucous membranes moist, pharynx normal without lesions and dental hygiene good  ? Neck supple and no masses  ? Cardiovascular: regular rate and rhythm  ? Respiratory:  no respiratory distress, normal breath sounds  ? Abdomen: soft, non-tender; bowel sounds normal; no masses,  no organomegaly  ? ?  ?Assessment:  ? ?Pregnancy: G3P1011 ?Patient Active Problem List  ? Diagnosis Date Noted  ? Supervision of high risk pregnancy, antepartum 12/13/2021  ? Uncomplicated asthma 03/19/9484  ? Obesity affecting pregnancy in first trimester 01/14/2016  ? ?  ?Plan:  ?1. Supervision of high risk pregnancy, antepartum ?New OB labs ?- CBC/D/Plt+RPR+Rh+ABO+RubIgG... ?- Culture, OB Urine ?- Cytology - PAP ?- [place] MFM OB DETAIL +14 WK; Future ? ?2. Nausea and vomiting in pregnancy prior to [redacted] weeks gestation ?Trial of Zofran ?- ondansetron (ZOFRAN-ODT) 4 MG disintegrating tablet; Take 1 tablet (4 mg total) by mouth every 6 (six) hours as needed for nausea.  Dispense: 42 tablet; Refill: 1 ? ?3. Obesity affecting pregnancy in first trimester ?- Hemoglobin A1c ?- TSH ? ?4. Elevated blood pressure affecting pregnancy in first trimester, antepartum ?Baseline  labs ?Check BP at home, let us know if > 140/90 ?Consider ASA ?- Comprehensive metabolic panel ?- Protein / creatinine ratio, urine ? ?5. Mild intermittent asthma without complication ?Has inhaler ?- albuterol (VENTOLIN HFA) 108 (90 Base) MCG/ACT inhaler; Inhale into the lungs. ? ?6. Screening for cervical cancer ?- Cytology - PAP ? ? ?Initial labs drawn. ?Continue prenatal vitamins. ?Genetic Screening discussed, NIPS: declined. ?Ultrasound discussed; fetal anatomic survey: ordered. ?Problem list reviewed and updated. ?The  nature of [place] with multiple MDs and other Advanced Practice Providers was explained to patient; also emphasized that residents, students are part of our team. ?Routine obstetric precautions reviewed. ?Return in 4 weeks (on 02/12/2022). ? ?  ? ?

## 2022-01-15 NOTE — Patient Instructions (Signed)

## 2022-01-16 DIAGNOSIS — R7303 Prediabetes: Secondary | ICD-10-CM | POA: Insufficient documentation

## 2022-01-16 HISTORY — DX: Prediabetes: R73.03

## 2022-01-16 LAB — TSH: TSH: 1.51 u[IU]/mL (ref 0.450–4.500)

## 2022-01-16 LAB — CBC/D/PLT+RPR+RH+ABO+RUBIGG...
Antibody Screen: NEGATIVE
Basophils Absolute: 0 10*3/uL (ref 0.0–0.2)
Basos: 0 %
EOS (ABSOLUTE): 0.1 10*3/uL (ref 0.0–0.4)
Eos: 1 %
HCV Ab: NONREACTIVE
HIV Screen 4th Generation wRfx: NONREACTIVE
Hematocrit: 41.6 % (ref 34.0–46.6)
Hemoglobin: 14.1 g/dL (ref 11.1–15.9)
Hepatitis B Surface Ag: NEGATIVE
Immature Grans (Abs): 0 10*3/uL (ref 0.0–0.1)
Immature Granulocytes: 0 %
Lymphocytes Absolute: 2 10*3/uL (ref 0.7–3.1)
Lymphs: 15 %
MCH: 28.7 pg (ref 26.6–33.0)
MCHC: 33.9 g/dL (ref 31.5–35.7)
MCV: 85 fL (ref 79–97)
Monocytes Absolute: 0.8 10*3/uL (ref 0.1–0.9)
Monocytes: 6 %
Neutrophils Absolute: 10.3 10*3/uL — ABNORMAL HIGH (ref 1.4–7.0)
Neutrophils: 78 %
Platelets: 320 10*3/uL (ref 150–450)
RBC: 4.91 x10E6/uL (ref 3.77–5.28)
RDW: 14.1 % (ref 11.7–15.4)
RPR Ser Ql: NONREACTIVE
Rh Factor: POSITIVE
Rubella Antibodies, IGG: 5.43 index (ref >0.99)
WBC: 13.3 10*3/uL — ABNORMAL HIGH (ref 3.4–10.8)

## 2022-01-16 LAB — COMPREHENSIVE METABOLIC PANEL
ALT: 11 IU/L (ref 0–32)
AST: 14 IU/L (ref 0–40)
Albumin/Globulin Ratio: 1.5 (ref 1.2–2.2)
Albumin: 4 g/dL (ref 3.8–4.8)
Alkaline Phosphatase: 62 IU/L (ref 44–121)
BUN/Creatinine Ratio: 17 (ref 9–23)
BUN: 10 mg/dL (ref 6–20)
Bilirubin Total: <0.2 mg/dL (ref 0.0–1.2)
CO2: 16 mmol/L — ABNORMAL LOW (ref 20–29)
Calcium: 9.7 mg/dL (ref 8.7–10.2)
Chloride: 101 mmol/L (ref 96–106)
Creatinine, Ser: 0.59 mg/dL (ref 0.57–1.00)
Globulin, Total: 2.7 g/dL (ref 1.5–4.5)
Glucose: 93 mg/dL (ref 70–99)
Potassium: 4.3 mmol/L (ref 3.5–5.2)
Sodium: 136 mmol/L (ref 134–144)
Total Protein: 6.7 g/dL (ref 6.0–8.5)
eGFR: 118 mL/min/{1.73_m2} (ref >59)

## 2022-01-16 LAB — HEMOGLOBIN A1C
Est. average glucose Bld gHb Est-mCnc: 120 mg/dL
Hgb A1c MFr Bld: 5.8 % — ABNORMAL HIGH (ref 4.8–5.6)

## 2022-01-16 LAB — HCV INTERPRETATION

## 2022-01-16 MED ORDER — ASPIRIN 81 MG PO CHEW: 81.0000 mg | CHEWABLE_TABLET | Freq: Every day | ORAL | 2 refills | Status: DC

## 2022-01-16 NOTE — Addendum Note (Signed)
Addended by: Donnamae Jude on: 01/16/2022 12:19 PM ? ? Modules accepted: Orders ? ?

## 2022-01-17 LAB — CULTURE, OB URINE

## 2022-01-17 LAB — PROTEIN / CREATININE RATIO, URINE
Creatinine, Urine: 274.4 mg/dL
Protein, Ur: 23.6 mg/dL
Protein/Creat Ratio: 86 mg/g creat (ref 0–200)

## 2022-01-17 LAB — URINE CULTURE, OB REFLEX: Organism ID, Bacteria: NO GROWTH

## 2022-01-18 LAB — CYTOLOGY - PAP
Chlamydia: NEGATIVE
Comment: NEGATIVE
Comment: NEGATIVE
Comment: NORMAL
Diagnosis: NEGATIVE
High risk HPV: NEGATIVE
Neisseria Gonorrhea: NEGATIVE

## 2022-02-05 ENCOUNTER — Telehealth (INDEPENDENT_AMBULATORY_CARE_PROVIDER_SITE_OTHER): Payer: 59 | Admitting: Obstetrics & Gynecology

## 2022-02-05 ENCOUNTER — Encounter: Payer: Self-pay | Admitting: Obstetrics & Gynecology

## 2022-02-05 ENCOUNTER — Encounter: Payer: Medicaid Other | Admitting: Obstetrics & Gynecology

## 2022-02-05 ENCOUNTER — Ambulatory Visit: Payer: Medicaid Other

## 2022-02-05 VITALS — BP 127/60

## 2022-02-05 DIAGNOSIS — Z3A16 16 weeks gestation of pregnancy: Secondary | ICD-10-CM

## 2022-02-05 DIAGNOSIS — O99512 Diseases of the respiratory system complicating pregnancy, second trimester: Secondary | ICD-10-CM

## 2022-02-05 DIAGNOSIS — O0992 Supervision of high risk pregnancy, unspecified, second trimester: Secondary | ICD-10-CM

## 2022-02-05 DIAGNOSIS — O0990 Supervision of high risk pregnancy, unspecified, unspecified trimester: Secondary | ICD-10-CM

## 2022-02-05 DIAGNOSIS — O219 Vomiting of pregnancy, unspecified: Secondary | ICD-10-CM

## 2022-02-05 DIAGNOSIS — J069 Acute upper respiratory infection, unspecified: Secondary | ICD-10-CM

## 2022-02-05 MED ORDER — METOCLOPRAMIDE HCL 10 MG PO TABS: 10.0000 mg | ORAL_TABLET | Freq: Four times a day (QID) | ORAL | 2 refills | Status: DC | PRN

## 2022-02-05 MED ORDER — PROMETHAZINE HCL 25 MG PO TABS: 25.0000 mg | ORAL_TABLET | Freq: Four times a day (QID) | ORAL | 2 refills | Status: DC | PRN

## 2022-02-05 NOTE — Patient Instructions (Signed)
Safe Medications in Pregnancy   Acne:  Benzoyl Peroxide  Salicylic Acid   Backache/Headache:  Tylenol: 2 regular strength every 4 hours OR               2 Extra strength every 6 hours   Colds/Coughs/Allergies:  Allegra Benadryl (alcohol free) 25 mg every 6 hours as needed  Breath right strips  Claritin  Cepacol throat lozenges  Chloraseptic throat spray  Cold-Eeze- up to three times per day  Cough drops, alcohol free  Flonase Guaifenesin  Mucinex (plain/DM) Nasacort Robitussin DM (plain only, alcohol free)  Saline nasal spray/drops  Steroid nasal sprays Sudafed (pseudoephedrine) & Actifed * use only after [redacted] weeks gestation and if you do not have high blood pressure  Tylenol  Vicks Vaporub  Zinc lozenges  Zyrtec   Make sure to not take anything that has the active ingredient Phenylephrine   Constipation:  Colace  Ducolax suppositories  Fleet enema  Glycerin suppositories  Metamucil  Milk of magnesia  Miralax  Senokot  Smooth move tea   Diarrhea:  Kaopectate  Imodium A-D   *NO pepto Bismol   Hemorrhoids:  Anusol  Anusol HC  Preparation H  Tucks   Indigestion:  Tums  Maalox  Mylanta  Zantac  Pepcid   Insomnia:  Benadryl (alcohol free) 25mg every 6 hours as needed  Tylenol PM  Unisom, no Gelcaps   Leg Cramps:  Tums  MagGel   Nausea/Vomiting:  Bonine  Dramamine  Emetrol  Ginger extract  Sea bands  Meclizine  Nausea medication to take during pregnancy:  Unisom (doxylamine succinate 25 mg tablets) Take one tablet daily at bedtime. If symptoms are not adequately controlled, the dose can be increased to a maximum recommended dose of two tablets daily (1/2 tablet in the morning, 1/2 tablet mid-afternoon and one at bedtime).  Vitamin B6 100mg tablets. Take one tablet twice a day (up to 200 mg per day).   Skin Rashes:  Aveeno products  Benadryl cream or 25mg every 6 hours as needed  Calamine Lotion  1% cortisone cream   Yeast infection:   Gyne-lotrimin 7  Monistat 7    **If taking multiple medications, please check labels to avoid duplicating the same active ingredients  **take medication as directed on the label  ** Do not exceed 4000 mg of tylenol in 24 hours  **Do not take medications that contain aspirin or ibuprofen           

## 2022-02-05 NOTE — Progress Notes (Signed)
? ?OBSTETRICS PRENATAL VIRTUAL VISIT ENCOUNTER NOTE ? ?Provider location: [facility] [employer] [facility]  ? ?Patient location: Home ? ?I connected with Jane Jackson on 02/05/22 at  3:30 PM EDT by MyChart Video Encounter and verified that I am speaking with the correct person using two identifiers. I discussed the limitations, risks, security and privacy concerns of performing an evaluation and management service virtually and the availability of in person appointments. I also discussed with the patient that there may be a patient responsible charge related to this service. The patient expressed understanding and agreed to proceed. ?Subjective:  ?Jane Jackson is a 36 y.o. G3P1011 at 69w1dbeing seen today for ongoing prenatal care.  She is currently monitored for the following issues for this high-risk pregnancy and has Maternal morbid obesity, antepartum (HChurchill; Uncomplicated asthma; Supervision of high risk pregnancy, antepartum; and Prediabetes on their problem list. ? ?Patient reports  cold symptoms, got it from her 36year old .  Also nausea and vomiting not helped with Zofran, wants other medications. Contractions: Not present. Vag. Bleeding: None.   . Denies any leaking of fluid.  ? ?The following portions of the patient's history were reviewed and updated as appropriate: allergies, current medications, past family history, past medical history, past social history, past surgical history and problem list.  ? ?Objective:  ? ?Vitals:  ? 02/05/22 1523  ?BP: 127/60  ? ?Fetal Status:          ? ?General:  Alert, oriented and cooperative. Patient is in no acute distress.  ?Respiratory: Normal respiratory effort, no problems with respiration noted  ?Mental Status: Normal mood and affect. Normal behavior. Normal judgment and thought content.  ?Rest of physical exam deferred due to type of encounter ? ?Imaging: ?No results found. ? ?Assessment and Plan:  ?Pregnancy: G3P1011 at 142w1d1. Upper  respiratory tract infection, unspecified type ?Gave list of OTC meds she can use. Told to let usKoreanow for worsening symptoms/asthma exacerbation. ? ? ?2. Nausea/vomiting in pregnancy ?Zofran is not working. Other antiemetics prescribed.  ?- metoCLOPramide (REGLAN) 10 MG tablet; Take 1 tablet (10 mg total) by mouth 4 (four) times daily as needed for nausea or vomiting.  Dispense: 30 tablet; Refill: 2 ?- promethazine (PHENERGAN) 25 MG tablet; Take 1 tablet (25 mg total) by mouth every 6 (six) hours as needed for nausea or vomiting.  Dispense: 30 tablet; Refill: 2 ? ?3. [redacted] weeks gestation of pregnancy ?4. Supervision of high risk pregnancy, antepartum ?Anatomy scan scheduled.  No other complaints or concerns.  Routine obstetric precautions reviewed. ?A1C 5.8, GTT recommended, she will call and schedule this (she is available on 02/16/22 morning).  ? ?I discussed the assessment and treatment plan with the patient. The patient was provided an opportunity to ask questions and all were answered. The patient agreed with the plan and demonstrated an understanding of the instructions. The patient was advised to call back or seek an in-person office evaluation/[facility] or concerning [facility]. ?Please refer to After Visit Summary for other counseling recommendations.  ? ?I provided 10 minutes of face-to-face time during this encounter. ? ?Return in about 4 weeks (around 03/05/2022) for OFFICE OB VISIT (MD or APP). ? ?Future Appointments  ?Date Time Provider [place]?02/08/2022  3:50 PM [facility] NURSE [facility]  ?02/26/2022 12:45 PM [facility] NURSE [facility]  ?02/26/2022  1:00 PM [facility] US1 [facility]  ?03/05/2022  3:30 PM NeErnestina Patches  Juanita Craver, MD [facility]  ?04/02/2022  2:30 PM Caren Macadam, MD [facility]  ? ? ?Verita Schneiders, MD ?[facility] [place] ? ?

## 2022-02-08 ENCOUNTER — Ambulatory Visit (INDEPENDENT_AMBULATORY_CARE_PROVIDER_SITE_OTHER): Payer: Medicaid Other | Admitting: Emergency Medicine

## 2022-02-08 VITALS — BP 106/68 | HR 90 | Wt 311.0 lb

## 2022-02-08 DIAGNOSIS — O0990 Supervision of high risk pregnancy, unspecified, unspecified trimester: Secondary | ICD-10-CM

## 2022-02-08 NOTE — Progress Notes (Signed)
Pt presents to check FHT. FHT 157. No other complaints.

## 2022-02-22 ENCOUNTER — Other Ambulatory Visit: Payer: Medicaid Other

## 2022-02-26 ENCOUNTER — Ambulatory Visit: Payer: Medicaid Other

## 2022-02-26 ENCOUNTER — Other Ambulatory Visit: Payer: Medicaid Other

## 2022-02-26 ENCOUNTER — Ambulatory Visit: Payer: Medicaid Other | Attending: Family Medicine

## 2022-02-26 ENCOUNTER — Ambulatory Visit (HOSPITAL_BASED_OUTPATIENT_CLINIC_OR_DEPARTMENT_OTHER): Payer: Medicaid Other | Admitting: Obstetrics and Gynecology

## 2022-02-26 ENCOUNTER — Ambulatory Visit: Payer: Medicaid Other | Admitting: *Deleted

## 2022-02-26 ENCOUNTER — Other Ambulatory Visit: Payer: Self-pay | Admitting: *Deleted

## 2022-02-26 VITALS — BP 122/81 | HR 97

## 2022-02-26 DIAGNOSIS — O0990 Supervision of high risk pregnancy, unspecified, unspecified trimester: Secondary | ICD-10-CM | POA: Diagnosis not present

## 2022-02-26 DIAGNOSIS — O09212 Supervision of pregnancy with history of pre-term labor, second trimester: Secondary | ICD-10-CM | POA: Insufficient documentation

## 2022-02-26 DIAGNOSIS — O162 Unspecified maternal hypertension, second trimester: Secondary | ICD-10-CM

## 2022-02-26 DIAGNOSIS — Z362 Encounter for other antenatal screening follow-up: Secondary | ICD-10-CM

## 2022-02-26 DIAGNOSIS — Z3A19 19 weeks gestation of pregnancy: Secondary | ICD-10-CM

## 2022-02-26 DIAGNOSIS — O99212 Obesity complicating pregnancy, second trimester: Secondary | ICD-10-CM

## 2022-02-26 DIAGNOSIS — O09522 Supervision of elderly multigravida, second trimester: Secondary | ICD-10-CM | POA: Diagnosis not present

## 2022-02-26 DIAGNOSIS — O35EXX0 Maternal care for other (suspected) fetal abnormality and damage, fetal genitourinary anomalies, not applicable or unspecified: Secondary | ICD-10-CM | POA: Diagnosis not present

## 2022-02-26 DIAGNOSIS — Q614 Renal dysplasia: Secondary | ICD-10-CM

## 2022-02-26 IMAGING — US US MFM OB DETAIL+14 WK
1 series · 15 of 28 positions shown · non-contrast
Comparison: none

[Series 1: us mfm ob detail+14 wk · 15 of 80 slices shown]
[im 1/80]
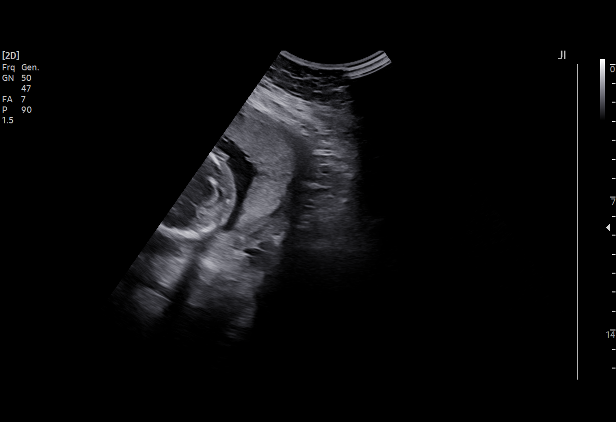
[im 6/80]
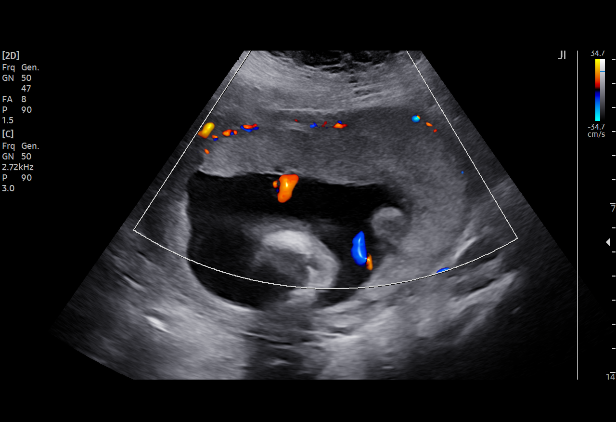
[im 12/80]
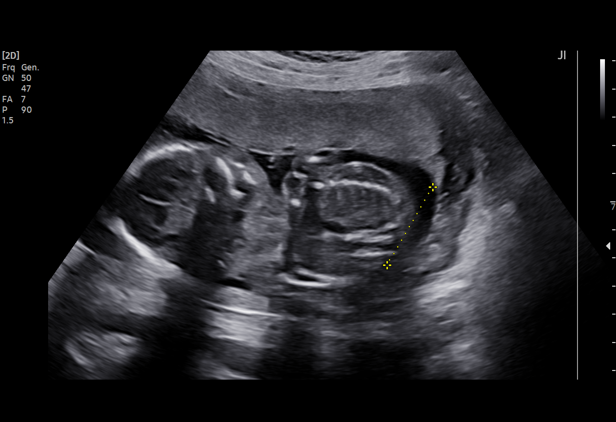
[im 18/80]
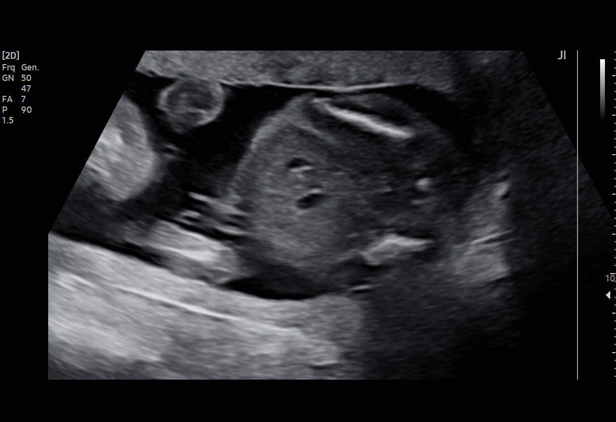
[im 24/80]
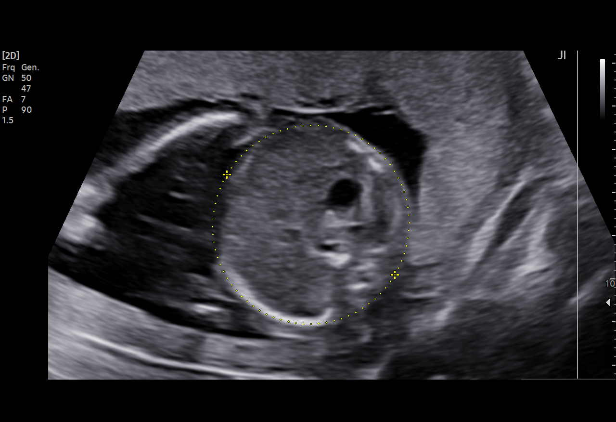
[im 30/80]
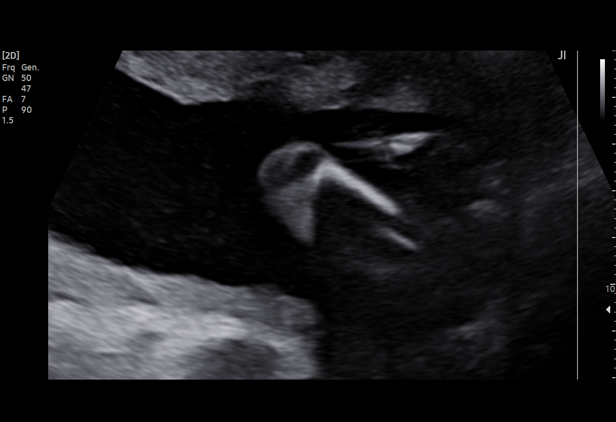
[im 36/80]
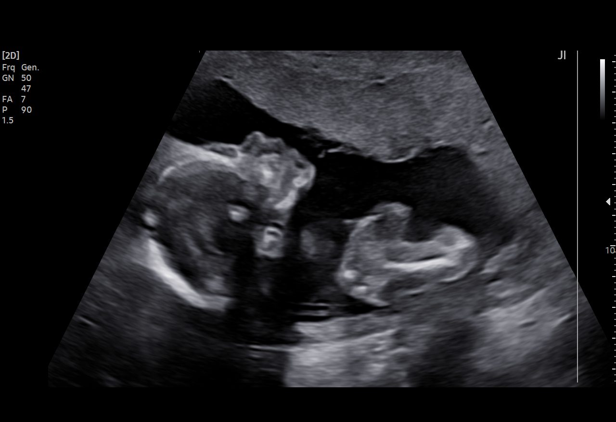
[im 41/80]
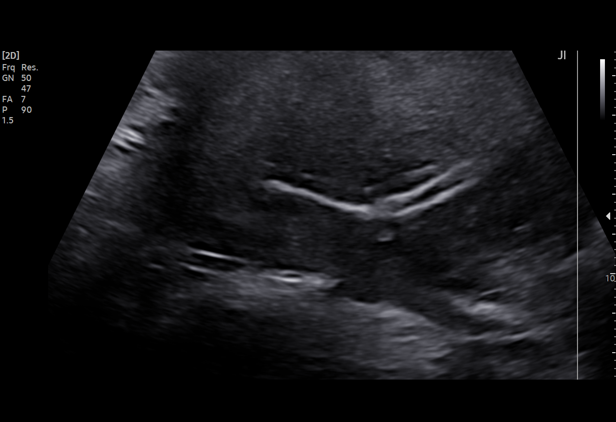
[im 44/80]
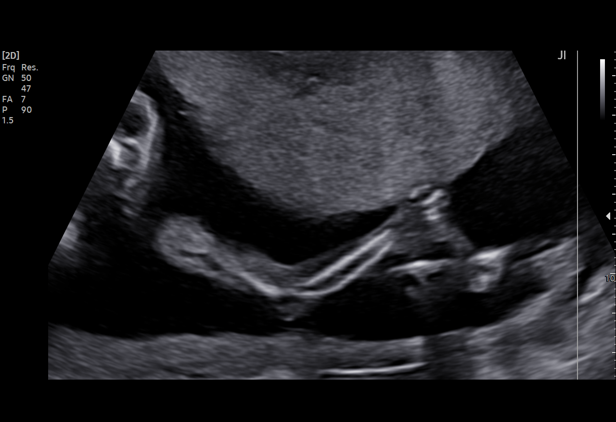
[im 50/80]
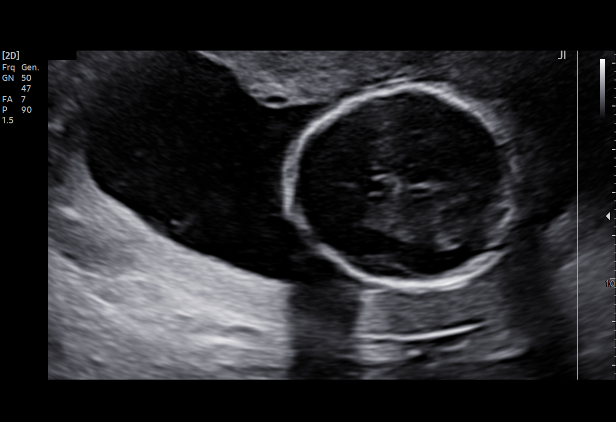
[im 56/80]
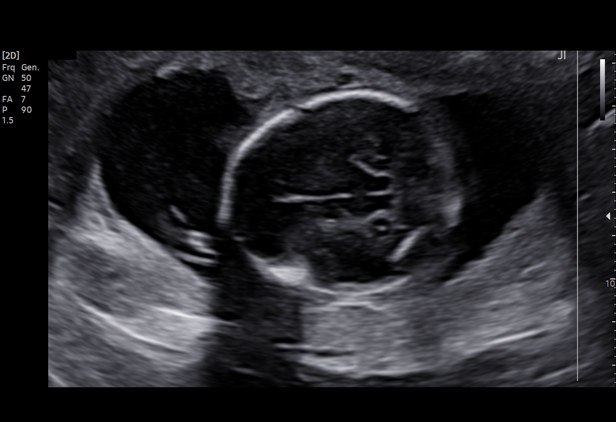
[im 62/80]
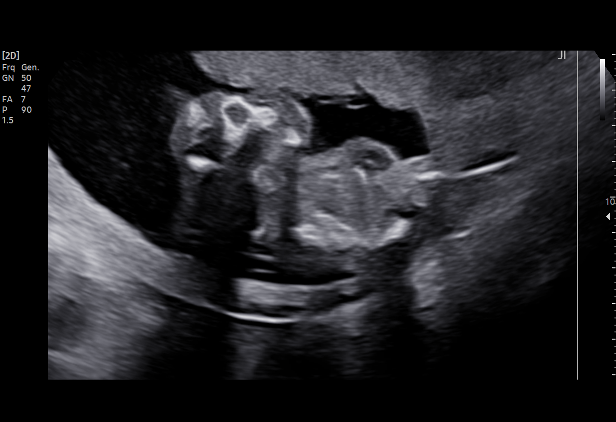
[im 68/80]
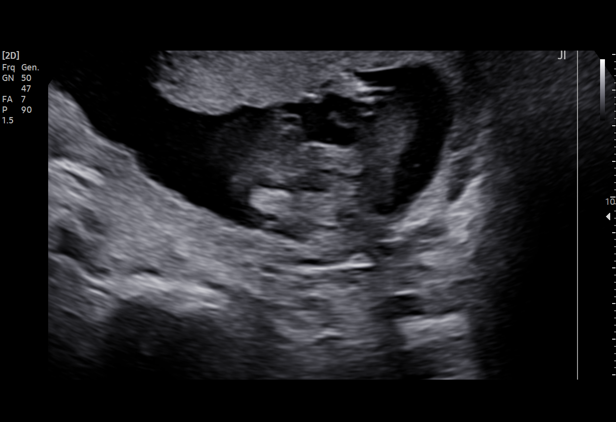
[im 74/80]
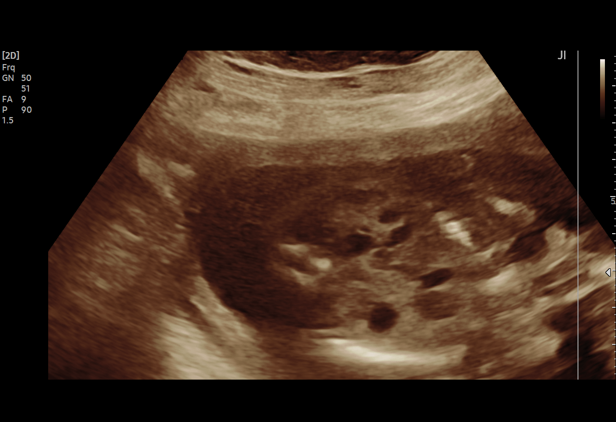
[im 80/80]
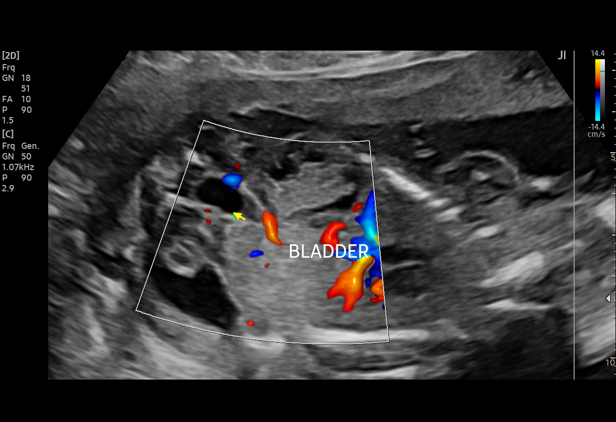

[15 of 28 positions shown; findings below may reference images not displayed]

Indications

 Advanced maternal age multigravida 35+,        [L8]
 second trimester
 19 weeks gestation of pregnancy
 Obesity complicating pregnancy, second         [L8]
 trimester
 Heart disease in mother affecting pregnancy    [L8]
 in second trimester (mitral valve prolapse)
 Fetal renal anomaly, unspecified fetus (right  [L8]
 multicystic dysplastic kidney)
Fetal Evaluation

 Num Of Fetuses:         1
 Fetal Heart Rate(bpm):  145
 Cardiac Activity:       Observed
 Presentation:           Breech
 Placenta:               Anterior
 P. Cord Insertion:      Visualized, central

 Amniotic Fluid
 AFI FV:      Within normal limits

                             Largest Pocket(cm)

Biometry

 BPD:     46.52  mm     G. Age:  20w 0d         85  %    CI:        76.25   %    70 - 86
                                                         FL/HC:      16.7   %    16.1 -
 HC:    168.82   mm     G. Age:  19w 4d         61  %    HC/AC:      1.14        1.09 -
 AC:    148.16   mm     G. Age:  20w 1d         76  %    FL/BPD:     60.7   %
 FL:      28.25  mm     G. Age:  18w 5d         26  %    FL/AC:      19.1   %    20 - 24
 CER:      19.5  mm     G. Age:  19w 0d         34  %
 NFT:       4.2  mm

 CM:        3.8  mm
 IOD:      12.2  mm     G. Age:  18w 4d         66  %
 OOD:      31.3  mm     G. Age:  19w 0d         57  %

 Est. FW:     294  gm    0 lb 10 oz      65  %
OB History

 Gravidity:    3         Term:   1        Prem:   0        SAB:   1
 TOP:          0       Ectopic:  0        Living: 1
Gestational Age

 LMP:           19w 1d        Date:  [DATE]                  EDD:   [DATE]
 U/S Today:     19w 4d                                        EDD:   [DATE]
 Best:          19w 1d     Det. By:  LMP  ([DATE])          EDD:   [DATE]
Anatomy

 Cranium:               Appears normal         Aortic Arch:            Not well visualized
 Cavum:                 Appears normal         Ductal Arch:            Not well visualized
 Ventricles:            Not well visualized    Diaphragm:              Appears normal
 Choroid Plexus:        Appears normal         Stomach:                Appears normal, left
                                                                       sided
 Cerebellum:            Appears normal         Abdomen:                Appears normal
 Posterior Fossa:       Not well visualized    Abdominal Wall:         Appears nml (cord
                                                                       insert, abd wall)
 Nuchal Fold:           Appears normal         Cord Vessels:           Appears normal (3
                                                                       vessel cord)
 Face:                  Appears normal         Kidneys:                Abnormal, see
                        (orbits and profile)
                                                                       comments
 Lips:                  Not well visualized    Bladder:                Appears normal
 Thoracic:              Appears normal         Spine:                  Not well visualized
 Heart:                 Appears normal         Upper Extremities:      Appears normal
                        (4CH, axis, and
                        situs)
 RVOT:                  Not well visualized    Lower Extremities:      Appears normal
 LVOT:                  Appears normal

 Other:  Parents do not wish to know sex of fetus. Nasal bone, lenses, maxilla,
         mandible and falx visualized Heels/foot angle and open hands/5th
         digits visualized.
Cervix Uterus Adnexa

 Uterus
 No abnormality visualized.

 Right Ovary
 Not visualized.

 Left Ovary
 Not visualized.
 Cul De Sac
 No free fluid seen.

 Adnexa
 No abnormality visualized.
Impression
 We performed a fetal anatomical survey.  Fetal biometry is
 consistent with the previously established dates.  Amniotic
 fluid is normal and good fetal activity seen.
 Multiple cysts are seen in the right kidney and this is
 consistent with diagnosis of multicystic dysplastic kidney. The
 left kidney and bladder appear normal.  No other obvious
 fetal structural defects or markers of aneuploidies are seen.
 Patient does not want to know fetal sex. We informed her that
 fetal sex will be mentioned in ultrasound report and will be in
 "MyChart" that will be accessible to the patient.
 As maternal obesity imposes limitations on the resolution of
 images, fetal anomalies may be missed.
 xxxxxxxxxxxxxxxxxxxxxxxxxxxxxxxxxxxxxxxxxxxxxxxxxxxxxxx
 Consultation (see [REDACTED] )
 I had the pleasure of seeing Ms. PREJEAN [facility] She is G3 [L8] at 19w 1d gestation
 and is here for fetal anatomy scan.
 She had opted not to screen for fetal aneuploidies.
 Obstetric history significant for a term vaginal delivery in [L8]
 of a male infant.  Her son had anal atresia and had multiple
 surgeries and is currently doing well.
 She does not have hypertension or diabetes.
 Multicystic dysplastic kidney:
 -It is a severe form of renal congenital anomaly that results in
 a nonfunctioning kidney. It is a developmental anomaly where
 the communication from the kidney to the ureter is blocked.
 -It is reassuring to see the other kidney not involved. In about
 30% of cases, the contralateral kidney can also be involved
 and, particularly, vesicoureteric reflux can occur. Serial
 ultrasound is necessary to evaluate the kidney. The
 contralateral kidney can undergo compensatory enlargement.
 -Incidence of MCDK is about 1in 4,000 births.
 -The association with chromosomal anomaly with isolated
 MCDK is very low. Risk of non-chromosomal syndromes is
 about 5% to 10%.
 -I reassured the patient of otherwise normal looking fetal
 anatomical survey. She understands the limitations of
 ultrasound in detecting fetal anomalies.
 -I discussed amniocentesis for a definitive result on the fetal
 karyotype. Amniocentesis (microarray) identifies some, but
 not all, genetic conditions. Patient opted not to have
 amniocentesis.
 -Postnatal evaluation will determine the exact pathology.
 -The incidence of malignancy in the affected kidney or
 hypertension is very low and nephrectomy is not usually
 performed.
 -Vaginal delivery is not contraindicated. Provided the amniotic
 fluid is normal and fetal growth is appropriate, early delivery
 is not indicated.
 -I recommended fetal echocardiography.
 I discussed the option of cell-free fetal DNA screening, its
 significance and limitations. Patient opted to have cell-free
 fetal DNA screening.
 She had her blood drawn today at [REDACTED].
Recommendations

 -An appointment was made for her to return in 4 weeks for
 completion of fetal anatomy and to evaluate the kidneys.
 -We have requested an appointment for fetal
 echocardiography (PREJEAN).
 -We will communicate cell-free fetal DNA screening results to
 the patient.
                PREJEAN

## 2022-02-26 NOTE — Progress Notes (Signed)
Maternal-Fetal Medicine   Name: Jazzmin Newbold DOB: [birth year] MRN: [id number] Referring Provider: Verita Schneiders, MD  I had the pleasure of seeing Ms. [place] [facility]. She is G3 P1011 at 19w 1d gestation and is here for fetal anatomy scan.  She had opted not to screen for fetal aneuploidies.  Obstetric history significant for a term vaginal delivery in 2018 of a female infant.  Her son had anal atresia and had multiple surgeries and is currently doing well.  She does not have hypertension or diabetes.  Ultrasound We performed a fetal anatomical survey.  Fetal biometry is consistent with the previously established dates.  Amniotic fluid is normal and good fetal activity seen.  Multiple cysts are seen in the right kidney and this is consistent with diagnosis of multicystic dysplastic kidney. The left kidney and bladder appear normal.  No other obvious fetal structural defects or markers of aneuploidies are seen.  Patient does not want to know fetal sex. We informed her that fetal sex will be mentioned in ultrasound report and will be in "MyChart" that will be accessible to the patient.  As maternal obesity imposes limitations on the resolution of images, fetal anomalies may be missed.  Multicystic dysplastic kidney: -It is a severe form of renal congenital anomaly that results in a nonfunctioning kidney. It is a developmental anomaly where the communication from the kidney to the ureter is blocked.  -It is reassuring to see the other kidney not involved. In about 30% of cases, the contralateral kidney can also be involved and, particularly, vesicoureteric reflux can occur. Serial ultrasound is necessary to evaluate the kidney. The contralateral kidney can undergo compensatory enlargement.  -Incidence of MCDK is about 1in 4,000 births.  -The association with chromosomal anomaly with isolated MCDK is very low. Risk of non-chromosomal syndromes is about 5%  to 10%.  -I reassured the patient of otherwise normal looking fetal anatomical survey. She understands the limitations of ultrasound in detecting fetal anomalies.  -I discussed amniocentesis for a definitive result on the fetal karyotype. Amniocentesis (microarray) identifies some, but not all, genetic conditions. Patient opted not to have amniocentesis. -Postnatal evaluation will determine the exact pathology.  -The incidence of malignancy in the affected kidney or hypertension is very low and nephrectomy is not usually performed.  -Vaginal delivery is not contraindicated. Provided the amniotic fluid is normal and fetal growth is appropriate, early delivery is not indicated. -I recommended fetal echocardiography. I discussed the option of cell-free fetal DNA screening, its significance and limitations. Patient opted to have cell-free fetal DNA screening. She had her blood drawn today at [facility]  Recommendations -An appointment was made for her to return in 4 weeks for completion of fetal anatomy and to evaluate the kidneys. -We have requested an appointment for fetal echocardiography ([facility]). -We will communicate cell-free fetal DNA screening results to the patient.   Thank you for consultation.  If you have any questions or concerns, please contact me the [facility].  Consultation including face-to-face (more than 50%) counseling 30 minutes.

## 2022-02-27 ENCOUNTER — Encounter: Payer: Self-pay | Admitting: Family Medicine

## 2022-02-27 DIAGNOSIS — O35EXX0 Maternal care for other (suspected) fetal abnormality and damage, fetal genitourinary anomalies, not applicable or unspecified: Secondary | ICD-10-CM | POA: Insufficient documentation

## 2022-03-04 LAB — MATERNIT21 PLUS CORE+SCA
Fetal Fraction: 6
Monosomy X (Turner Syndrome): NOT DETECTED
Result (T21): NEGATIVE
Trisomy 13 (Patau syndrome): NEGATIVE
Trisomy 18 (Edwards syndrome): NEGATIVE
Trisomy 21 (Down syndrome): NEGATIVE
XXX (Triple X Syndrome): NOT DETECTED
XXY (Klinefelter Syndrome): NOT DETECTED
XYY (Jacobs Syndrome): NOT DETECTED

## 2022-03-05 ENCOUNTER — Other Ambulatory Visit (INDEPENDENT_AMBULATORY_CARE_PROVIDER_SITE_OTHER): Payer: 59

## 2022-03-05 ENCOUNTER — Telehealth: Payer: Self-pay | Admitting: Genetics

## 2022-03-05 ENCOUNTER — Encounter: Payer: Self-pay | Admitting: Family Medicine

## 2022-03-05 ENCOUNTER — Ambulatory Visit: Payer: Medicaid Other | Admitting: Family Medicine

## 2022-03-05 VITALS — BP 136/82 | HR 86 | Wt 318.0 lb

## 2022-03-05 DIAGNOSIS — O24419 Gestational diabetes mellitus in pregnancy, unspecified control: Secondary | ICD-10-CM

## 2022-03-05 DIAGNOSIS — O24912 Unspecified diabetes mellitus in pregnancy, second trimester: Secondary | ICD-10-CM

## 2022-03-05 DIAGNOSIS — O0990 Supervision of high risk pregnancy, unspecified, unspecified trimester: Secondary | ICD-10-CM

## 2022-03-05 DIAGNOSIS — R7303 Prediabetes: Secondary | ICD-10-CM | POA: Diagnosis not present

## 2022-03-05 DIAGNOSIS — O35EXX0 Maternal care for other (suspected) fetal abnormality and damage, fetal genitourinary anomalies, not applicable or unspecified: Secondary | ICD-10-CM

## 2022-03-05 NOTE — Telephone Encounter (Signed)
Called [facility] to return NIPS results. Left voicemail with [facility] call back number.

## 2022-03-05 NOTE — Progress Notes (Signed)
ROB [redacted]w[redacted]d CC: None

## 2022-03-05 NOTE — Progress Notes (Signed)
   PRENATAL VISIT NOTE  Subjective:  Jane Jackson is a 36 y.o. G3P1011 at 49w1dbeing seen today for ongoing prenatal care.  She is currently monitored for the following issues for this high-risk pregnancy and has Maternal morbid obesity, antepartum ([place]; Uncomplicated asthma; Supervision of high risk pregnancy, antepartum; Prediabetes; Fetal multicystic dysplastic kidney affect care of mother, antepartum; and Gestational diabetes mellitus (GDM) affecting pregnancy, antepartum on their problem list.  Patient reports  possible UTI, giving urine sample .  Contractions: Not present. Vag. Bleeding: None.  Movement: Present. Denies leaking of fluid.   The following portions of the patient's history were reviewed and updated as appropriate: allergies, current medications, past family history, past medical history, past social history, past surgical history and problem list.   Objective:   Vitals:   03/05/22 1541  BP: 136/82  Pulse: 86  Weight: (!) 318 lb (144.2 kg)    Fetal Status: Fetal Heart Rate (bpm): 150 Fundal Height: 20 cm Movement: Present     General:  Alert, oriented and cooperative. Patient is in no acute distress.  Skin: Skin is warm and dry. No rash noted.   Cardiovascular: Normal heart rate noted  Respiratory: Normal respiratory effort, no problems with respiration noted  Abdomen: Soft, gravid, appropriate for gestational age.  Pain/Pressure: Absent     Pelvic: Cervical exam deferred        Extremities: Normal range of motion.  Edema: None  Mental Status: Normal mood and affect. Normal behavior. Normal judgment and thought content.   Assessment and Plan:  Pregnancy: G3P1011 at [redacted]w[redacted]d. Supervision of high risk pregnancy, antepartum Reviewed pregnancy complications and also provided support today  2. Fetal multicystic dysplastic kidney affecting care of mother, antepartum, single or unspecified fetus Provided support today with active listening - Culture, OB Urine  3.  Maternal morbid obesity, antepartum ([place]=18 lb (8.165 kg) which is above goal for this pregnancy  4. Prediabetes 2hr GTT today   Preterm labor symptoms and general obstetric precautions including but not limited to vaginal bleeding, contractions, leaking of fluid and fetal movement were reviewed in detail with the patient. Please refer to After Visit Summary for other counseling recommendations.   Return in about 4 weeks (around 04/02/2022) for Routine prenatal care.  Future Appointments  Date Time Provider [place]7/06/2022  3:30 PM [facility]7/06/2022  3:45 PM WMC-M[facility] US5 [facility]7/17/2023  2:50 PM NeCaren MacadamMD [facility]  05/08/2022  9:35 AM PiAletha HalimMD [facility]  05/21/2022  3:30 PM NeCaren MacadamMD [facility]    KiCaren MacadamMD

## 2022-03-06 ENCOUNTER — Encounter: Payer: Self-pay | Admitting: Family Medicine

## 2022-03-06 DIAGNOSIS — O24419 Gestational diabetes mellitus in pregnancy, unspecified control: Secondary | ICD-10-CM | POA: Insufficient documentation

## 2022-03-06 DIAGNOSIS — O35EXX0 Maternal care for other (suspected) fetal abnormality and damage, fetal genitourinary anomalies, not applicable or unspecified: Secondary | ICD-10-CM | POA: Diagnosis not present

## 2022-03-06 DIAGNOSIS — O09299 Supervision of pregnancy with other poor reproductive or obstetric history, unspecified trimester: Secondary | ICD-10-CM | POA: Insufficient documentation

## 2022-03-06 LAB — GLUCOSE TOLERANCE, 2 HOURS W/ 1HR
Glucose, 1 hour: 189 mg/dL — ABNORMAL HIGH (ref 70–179)
Glucose, 2 hour: 172 mg/dL — ABNORMAL HIGH (ref 70–152)
Glucose, Fasting: 105 mg/dL — ABNORMAL HIGH (ref 70–91)

## 2022-03-06 MED ORDER — DEXCOM G6 TRANSMITTER MISC: 1.0000 [IU] | 6 refills | Status: DC

## 2022-03-06 MED ORDER — DEXCOM G6 SENSOR MISC: 12 refills | Status: DC

## 2022-03-06 NOTE — Progress Notes (Unsigned)
   PRENATAL VISIT NOTE  Subjective:  Jane Jackson is a 36 y.o. G3P1011 at 5w1dbeing seen today for ongoing prenatal care.  She is currently monitored for the following issues for this high-risk pregnancy and has Maternal morbid obesity, antepartum ([place]; Uncomplicated asthma; Supervision of high risk pregnancy, antepartum; Prediabetes; Fetal multicystic dysplastic kidney affect care of mother, antepartum; and Gestational diabetes mellitus (GDM) affecting pregnancy, antepartum on their problem list.  Patient reports  emotional with the recent fetal anomalies .   .  .   . Denies leaking of fluid.   The following portions of the patient's history were reviewed and updated as appropriate: allergies, current medications, past family history, past medical history, past social history, past surgical history and problem list.   Objective:  There were no vitals filed for this visit.  Fetal Status:           General:  Alert, oriented and cooperative. Patient is in no acute distress.  Skin: Skin is warm and dry. No rash noted.   Cardiovascular: Normal heart rate noted  Respiratory: Normal respiratory effort, no problems with respiration noted  Abdomen: Soft, gravid, appropriate for gestational age.        Pelvic: Cervical exam deferred        Extremities: Normal range of motion.     Mental Status: Normal mood and affect. Normal behavior. Normal judgment and thought content.   Assessment and Plan:  Pregnancy: G3P1011 at 261w2d. Supervision of high risk pregnancy, antepartum *** - Glucose Tolerance, 2 Hours w/1 Hour  2. Prediabetes - Glucose Tolerance, 2 Hours w/1 Hour  3. Diabetes mellitus affecting pregnancy in second trimester - Continuous Blood Gluc Transmit (DEXCOM G6 TRANSMITTER) MISC; 1 Units by Does not apply route every 3 (three) months.  Dispense: 1 each; Refill: 6 - Continuous Blood Gluc Sensor (DEXCOM G6 SENSOR) MISC; Place one sensor every 10 days  Dispense: 3 each; Refill:  12 - Referral to Nutrition and Diabetes Services  4. Fetal multicystic kidney - worried about this  - provided support to patient  Preterm labor symptoms and general obstetric precautions including but not limited to vaginal bleeding, contractions, leaking of fluid and fetal movement were reviewed in detail with the patient. Please refer to After Visit Summary for other counseling recommendations.   Return in about 4 weeks (around 04/02/2022) for Routine prenatal care.  Future Appointments  Date Time Provider [place]7/06/2022  3:30 PM [facility]7/06/2022  3:45 PM WMC-MFC US5 [facility]7/17/2023  2:50 PM NeCaren MacadamMD [facility]  05/08/2022  9:35 AM PiAletha HalimMD [facility]  05/21/2022  3:30 PM NeCaren MacadamMD [facility]    KiCaren MacadamMD

## 2022-03-08 LAB — URINE CULTURE, OB REFLEX

## 2022-03-08 LAB — CULTURE, OB URINE

## 2022-04-02 ENCOUNTER — Ambulatory Visit: Payer: 59 | Attending: Obstetrics and Gynecology

## 2022-04-02 ENCOUNTER — Encounter: Payer: Medicaid Other | Admitting: Family Medicine

## 2022-04-02 ENCOUNTER — Ambulatory Visit: Payer: 59 | Admitting: *Deleted

## 2022-04-02 VITALS — BP 135/83 | HR 89

## 2022-04-02 DIAGNOSIS — O0990 Supervision of high risk pregnancy, unspecified, unspecified trimester: Secondary | ICD-10-CM

## 2022-04-02 DIAGNOSIS — O162 Unspecified maternal hypertension, second trimester: Secondary | ICD-10-CM | POA: Insufficient documentation

## 2022-04-02 DIAGNOSIS — O24419 Gestational diabetes mellitus in pregnancy, unspecified control: Secondary | ICD-10-CM

## 2022-04-02 DIAGNOSIS — O358XX0 Maternal care for other (suspected) fetal abnormality and damage, not applicable or unspecified: Secondary | ICD-10-CM | POA: Diagnosis not present

## 2022-04-02 DIAGNOSIS — Z3A24 24 weeks gestation of pregnancy: Secondary | ICD-10-CM

## 2022-04-02 DIAGNOSIS — E669 Obesity, unspecified: Secondary | ICD-10-CM

## 2022-04-02 DIAGNOSIS — Z362 Encounter for other antenatal screening follow-up: Secondary | ICD-10-CM | POA: Diagnosis not present

## 2022-04-02 DIAGNOSIS — O99212 Obesity complicating pregnancy, second trimester: Secondary | ICD-10-CM | POA: Diagnosis not present

## 2022-04-02 DIAGNOSIS — O35EXX0 Maternal care for other (suspected) fetal abnormality and damage, fetal genitourinary anomalies, not applicable or unspecified: Secondary | ICD-10-CM

## 2022-04-02 DIAGNOSIS — O09522 Supervision of elderly multigravida, second trimester: Secondary | ICD-10-CM

## 2022-04-02 DIAGNOSIS — O24410 Gestational diabetes mellitus in pregnancy, diet controlled: Secondary | ICD-10-CM | POA: Diagnosis not present

## 2022-04-03 ENCOUNTER — Other Ambulatory Visit: Payer: Self-pay | Admitting: *Deleted

## 2022-04-03 DIAGNOSIS — O35EXX1 Maternal care for other (suspected) fetal abnormality and damage, fetal genitourinary anomalies, fetus 1: Secondary | ICD-10-CM

## 2022-04-03 DIAGNOSIS — O24410 Gestational diabetes mellitus in pregnancy, diet controlled: Secondary | ICD-10-CM

## 2022-04-09 ENCOUNTER — Telehealth: Payer: Self-pay

## 2022-04-09 ENCOUNTER — Telehealth (INDEPENDENT_AMBULATORY_CARE_PROVIDER_SITE_OTHER): Payer: Medicaid Other | Admitting: Family Medicine

## 2022-04-09 ENCOUNTER — Encounter: Payer: Self-pay | Admitting: Family Medicine

## 2022-04-09 VITALS — BP 134/82 | Wt 312.0 lb

## 2022-04-09 DIAGNOSIS — O99513 Diseases of the respiratory system complicating pregnancy, third trimester: Secondary | ICD-10-CM

## 2022-04-09 DIAGNOSIS — O0993 Supervision of high risk pregnancy, unspecified, third trimester: Secondary | ICD-10-CM

## 2022-04-09 DIAGNOSIS — Z3A25 25 weeks gestation of pregnancy: Secondary | ICD-10-CM

## 2022-04-09 DIAGNOSIS — O24419 Gestational diabetes mellitus in pregnancy, unspecified control: Secondary | ICD-10-CM

## 2022-04-09 DIAGNOSIS — J4541 Moderate persistent asthma with (acute) exacerbation: Secondary | ICD-10-CM

## 2022-04-09 DIAGNOSIS — O35EXX0 Maternal care for other (suspected) fetal abnormality and damage, fetal genitourinary anomalies, not applicable or unspecified: Secondary | ICD-10-CM

## 2022-04-09 DIAGNOSIS — J45909 Unspecified asthma, uncomplicated: Secondary | ICD-10-CM

## 2022-04-09 DIAGNOSIS — O0990 Supervision of high risk pregnancy, unspecified, unspecified trimester: Secondary | ICD-10-CM

## 2022-04-09 MED ORDER — PREDNISONE 20 MG PO TABS: 20.0000 mg | ORAL_TABLET | Freq: Every day | ORAL | 0 refills | Status: DC

## 2022-04-09 MED ORDER — AZITHROMYCIN 250 MG PO TABS: ORAL_TABLET | ORAL | 1 refills | Status: DC

## 2022-04-09 NOTE — Progress Notes (Signed)
OBSTETRICS PRENATAL VIRTUAL VISIT ENCOUNTER NOTE  Provider location: [facility] [place] [facility]   Patient location: Home  I connected with Katee Wentland on 04/09/22 at  2:50 PM EDT by MyChart Video Encounter and verified that I am speaking with the correct person using two identifiers. I discussed the limitations, risks, security and privacy concerns of performing an evaluation and management service virtually and the availability of in person appointments. I also discussed with the patient that there may be a patient responsible charge related to this service. The patient expressed understanding and agreed to proceed. Subjective:  Jane Jackson is a 36 y.o. G3P1011 at 49w1dbeing seen today for ongoing prenatal care.  She is currently monitored for the following issues for this high-risk pregnancy and has Maternal morbid obesity, antepartum ([place]; Uncomplicated asthma; Supervision of high risk pregnancy, antepartum; Prediabetes; Fetal multicystic dysplastic kidney affect care of mother, antepartum; and Gestational diabetes mellitus (GDM) affecting pregnancy, antepartum on their problem list.  Patient reports  bronchitis-- reports using inhaler more. Started Thursday and through the weekend.  Using benadryl.  Coughing up yellow green sputum. Low grade fevers only. Does not feel like PNA and has had this in the past.   Contractions: Not present. Vag. Bleeding: None.  Movement: Present. Denies any leaking of fluid.   The following portions of the patient's history were reviewed and updated as appropriate: allergies, current medications, past family history, past medical history, past social history, past surgical history and problem list.   Objective:  There were no vitals filed for this visit.  Fetal Status:     Movement: Present     General:  Alert, oriented and cooperative. Patient is in no acute distress.  Respiratory: Normal respiratory effort, no problems with  respiration noted  Mental Status: Normal mood and affect. Normal behavior. Normal judgment and thought content.  Rest of physical exam deferred due to type of encounter  Imaging: UKoreaMFM OB FOLLOW UP  Result Date: 04/02/2022 ----------------------------------------------------------------------  OBSTETRICS REPORT                       (Signed Final 04/02/2022 05:41 pm) ---------------------------------------------------------------------- Patient Info  ID #:       [id number]                         D.O.B.:  005/23/1987(36 yrs)  Name:       JThedora Hinders                 Visit Date: 04/02/2022 04:40 pm ---------------------------------------------------------------------- Performed By  Attending:        RTama HighMD        Ref. Address:     [address] Performed By:     CRodrigo RanBS      [facility]         [facility]                    RDMS RVT  [facility]etal [facility] at                                                             [place] for                                                             Women  Referred By:      [facility] ---------------------------------------------------------------------- Orders  #  Description                           Code        Ordered By  1  [place] MFM OB FOLLOW UP                   [id number]    Tama High ----------------------------------------------------------------------  #  Order #                     Accession #                Episode #  1  [id number]                   [id number]                 [id number] ---------------------------------------------------------------------- Indications  [redacted] weeks gestation of pregnancy                Z3A.71  Advanced maternal age multigravida 73+,        O26.522  second trimester  Obesity complicating pregnancy, second         O99.212  trimester (pregravid BMI 49)  Heart disease in mother affecting pregnancy    O99.412  in  second trimester (mitral valve prolapse)  Fetal renal anomaly, unspecified fetus (right  O35.8XX0  multicystic dysplastic kidney)  Low risk NIPS  Gestational diabetes in pregnancy, diet        O24.410  controlled ---------------------------------------------------------------------- Fetal Evaluation  Num Of Fetuses:         1  Cardiac Activity:       Observed  Presentation:           Cephalic  Placenta:               Anterior  P. Cord Insertion:      Visualized  Amniotic Fluid  AFI FV:      Within normal limits                              Largest Pocket(cm)                              6.88 ---------------------------------------------------------------------- Biometry  BPD:     63.11  mm     G. Age:  25w 4d         88  %    CI:        77.24   %    70 -  86                                                          FL/HC:      20.4   %    18.7 - 20.9  HC:    227.37   mm     G. Age:  24w 5d         56  %    HC/AC:      1.13        1.05 - 1.21  AC:    200.65   mm     G. Age:  24w 5d         59  %    FL/BPD:     73.6   %    71 - 87  FL:      46.43  mm     G. Age:  25w 3d         37  %    FL/AC:      23.1   %    20 - 24  CER:      26.4  mm     G. Age:  23w 5d         52  %  LV:        6.8  mm  CM:        8.3  mm  Est. FW:     761  gm    1 lb 11 oz      81  % ---------------------------------------------------------------------- OB History  Gravidity:    3         Term:   1        Prem:   0        SAB:   1  TOP:          0       Ectopic:  0        Living: 1 ---------------------------------------------------------------------- Gestational Age  LMP:           24w 1d        Date:  10/15/21                  EDD:   07/22/22  U/S Today:     25w 1d                                        EDD:   07/15/22  Best:          24w 1d     Det. By:  LMP  (10/15/21)          EDD:   07/22/22 ---------------------------------------------------------------------- Anatomy  Cranium:               Appears normal         Aortic Arch:             Appears normal  Cavum:                 Appears normal         Ductal Arch:            Appears normal  Ventricles:  Appears normal         Diaphragm:              Appears normal  Choroid Plexus:        Appears normal         Stomach:                Appears normal, left                                                                        sided  Cerebellum:            Appears normal         Abdomen:                Appears normal  Posterior Fossa:       Appears normal         Abdominal Wall:         Appears nml (cord                                                                        insert, abd wall)  Nuchal Fold:           Previously seen        Cord Vessels:           Appears normal (3                                                                        vessel cord)  Face:                  Appears normal         Kidneys:                RT multicystic                         (orbits and profile)                                                                        dysplastic  Lips:                  Appears normal         Bladder:                Appears normal  Thoracic:  Appears normal         Spine:                  Appears normal  Heart:                 Previously seen        Upper Extremities:      Previously seen  RVOT:                  Appears normal         Lower Extremities:      Previously seen  LVOT:                  Appears normal  Other:  Female gender previously seen. Nasal bone, lenses, maxilla, mandible          and falx previously visualized Heels/foot angle and open hands/5th          digits previously visualized. 3VV and VC visualized. ---------------------------------------------------------------------- Cervix Uterus Adnexa  Cervix  Not visualized (advanced GA >24wks)  Uterus  Single fibroid noted, see table below.  Right Ovary  Not visualized.  Left Ovary  Within normal limits.  Cul De Sac  No free fluid seen.  Adnexa  No abnormality visualized.  ---------------------------------------------------------------------- Myomas  Site                     L(cm)      W(cm)      D(cm)       Location  Anterior                 2.4        2.2        1.7 ----------------------------------------------------------------------  Blood Flow                  RI       PI       Comments ---------------------------------------------------------------------- Impression  Patient with right multicystic dysplastic kidney returned for  completion of fetal anatomy.  She has a new diagnosis of  gestational diabetes and reports her blood glucose levels are  within normal range.  She checks her blood glucose 4 times  daily.  Patient has an appointment for fetal echocardiography.  Fetal growth is appropriate for gestational age.  Amniotic fluid  is normal and good fetal activity seen.  Right multicystic  dysplastic kidney is seen again.  Left kidney appears normal.  Fetal bladder appears normal.  Fetal anatomical survey was  completed and otherwise appears normal.  I reassured the patient of normal fetal growth and amniotic  fluid. ---------------------------------------------------------------------- Recommendations  -An appointment was made for her to return in 4 weeks for  fetal growth assessment.  -Fetal growth assessments every 4 weeks.  -Weekly BPP from [redacted] weeks gestation till delivery. ----------------------------------------------------------------------                 Tama High, MD Electronically Signed Final Report   04/02/2022 05:41 pm ----------------------------------------------------------------------   Assessment and Plan:  Pregnancy: [id number] at [redacted]w[redacted]d 1. Fetal multicystic dysplastic kidney affecting care of mother, antepartum, single or unspecified fetus MFM UKoreascheduled Fetal echo rescheduled from today to 7/28 due to URI/Asthma exacerbation 2. Supervision of high risk pregnancy, antepartum 100-130s/70s Doing Ok pregnancy wise  TWG=12 lb (5.443 kg)   3.  Gestational diabetes mellitus (GDM) affecting pregnancy, antepartum Just picked up dexcom [place]  100s (103-110) PP always < 120 Has MFM growth  scan scheduled  4. Asthma affecting pregnancy in second trimester  5. Moderate persistent asthma with exacerbation - Discussed treatment given increased use of albuterol  - Agrees to prednisone and azithryomycin - Discussed when to seek care at [facility] - Patient will start medications ASAP - Reviewed affect of prednisone on BG and patient knows to expect elevations   Preterm labor symptoms and general obstetric precautions including but not limited to vaginal bleeding, contractions, leaking of fluid and fetal movement were reviewed in detail with the patient. I discussed the assessment and treatment plan with the patient. The patient was provided an opportunity to ask questions and all were answered. The patient agreed with the plan and demonstrated an understanding of the instructions. The patient was advised to call back or seek an in-person office evaluation/go to [facility] at [facility] for any urgent or concerning symptoms. Please refer to After Visit Summary for other counseling recommendations.   I provided 15 minutes of face-to-face time during this encounter.  Return in about 4 weeks (around 05/07/2022) for Routine prenatal care.  Future Appointments  Date Time Provider [place]  05/04/2022  3:15 PM [facility] NURSE [facility]  05/04/2022  3:30 PM WMC-MFC US3 W[facility]MFCUS [facility]  05/08/2022  9:35 AM Aletha Halim, MD [facility]  05/21/2022  3:30 PM Caren Macadam, MD [facility]    Caren Macadam, [place] [facility] [place]

## 2022-04-09 NOTE — Telephone Encounter (Signed)
TC to pt pharmacy regarding diabetes Dexcom testing supplies  Pharmacy stated Rx are covered and pt has a co-pay of approx 28 for both  No pre Josem Kaufmann is required   Pharmacy also stated if not in stock can order today and have it ready tomorrow. Will make pt aware

## 2022-04-09 NOTE — Progress Notes (Signed)
Virtual ROB [redacted]w[redacted]d Pt notes having to use inhaler more lately.pt believes she may have bronchitis   Pt states her sugars before eating have been 103-110 .

## 2022-04-30 ENCOUNTER — Ambulatory Visit: Payer: 59

## 2022-05-04 ENCOUNTER — Ambulatory Visit: Payer: 59

## 2022-05-07 ENCOUNTER — Ambulatory Visit: Payer: 59 | Admitting: *Deleted

## 2022-05-07 ENCOUNTER — Encounter: Payer: Self-pay | Admitting: *Deleted

## 2022-05-07 ENCOUNTER — Ambulatory Visit: Payer: 59 | Attending: Obstetrics and Gynecology

## 2022-05-07 VITALS — BP 135/77 | HR 94

## 2022-05-07 DIAGNOSIS — I341 Nonrheumatic mitral (valve) prolapse: Secondary | ICD-10-CM

## 2022-05-07 DIAGNOSIS — O99413 Diseases of the circulatory system complicating pregnancy, third trimester: Secondary | ICD-10-CM | POA: Diagnosis not present

## 2022-05-07 DIAGNOSIS — O24410 Gestational diabetes mellitus in pregnancy, diet controlled: Secondary | ICD-10-CM | POA: Insufficient documentation

## 2022-05-07 DIAGNOSIS — O35EXX0 Maternal care for other (suspected) fetal abnormality and damage, fetal genitourinary anomalies, not applicable or unspecified: Secondary | ICD-10-CM | POA: Diagnosis not present

## 2022-05-07 DIAGNOSIS — O0990 Supervision of high risk pregnancy, unspecified, unspecified trimester: Secondary | ICD-10-CM

## 2022-05-07 DIAGNOSIS — O3663X0 Maternal care for excessive fetal growth, third trimester, not applicable or unspecified: Secondary | ICD-10-CM | POA: Diagnosis not present

## 2022-05-07 DIAGNOSIS — O35EXX1 Maternal care for other (suspected) fetal abnormality and damage, fetal genitourinary anomalies, fetus 1: Secondary | ICD-10-CM | POA: Diagnosis not present

## 2022-05-07 DIAGNOSIS — O24419 Gestational diabetes mellitus in pregnancy, unspecified control: Secondary | ICD-10-CM | POA: Insufficient documentation

## 2022-05-07 DIAGNOSIS — E669 Obesity, unspecified: Secondary | ICD-10-CM | POA: Diagnosis not present

## 2022-05-07 DIAGNOSIS — Z3A29 29 weeks gestation of pregnancy: Secondary | ICD-10-CM

## 2022-05-07 DIAGNOSIS — O09523 Supervision of elderly multigravida, third trimester: Secondary | ICD-10-CM

## 2022-05-07 DIAGNOSIS — O99213 Obesity complicating pregnancy, third trimester: Secondary | ICD-10-CM | POA: Diagnosis not present

## 2022-05-08 ENCOUNTER — Other Ambulatory Visit: Payer: 59

## 2022-05-08 ENCOUNTER — Encounter: Payer: Medicaid Other | Admitting: Obstetrics and Gynecology

## 2022-05-08 ENCOUNTER — Other Ambulatory Visit: Payer: Self-pay | Admitting: *Deleted

## 2022-05-08 DIAGNOSIS — Z6841 Body Mass Index (BMI) 40.0 and over, adult: Secondary | ICD-10-CM

## 2022-05-08 DIAGNOSIS — O24410 Gestational diabetes mellitus in pregnancy, diet controlled: Secondary | ICD-10-CM

## 2022-05-08 DIAGNOSIS — O35EXX1 Maternal care for other (suspected) fetal abnormality and damage, fetal genitourinary anomalies, fetus 1: Secondary | ICD-10-CM

## 2022-05-08 DIAGNOSIS — O09523 Supervision of elderly multigravida, third trimester: Secondary | ICD-10-CM

## 2022-05-10 ENCOUNTER — Encounter: Payer: Medicaid Other | Admitting: Obstetrics and Gynecology

## 2022-05-10 ENCOUNTER — Telehealth: Payer: Self-pay

## 2022-05-10 NOTE — Telephone Encounter (Signed)
Pt left voicemail to cancel her appointment for today due to work. Called pt back and left voicemail to call office back regarding appt.

## 2022-05-21 ENCOUNTER — Ambulatory Visit (INDEPENDENT_AMBULATORY_CARE_PROVIDER_SITE_OTHER): Payer: 59 | Admitting: Family Medicine

## 2022-05-21 VITALS — BP 127/89 | HR 114 | Wt 318.0 lb

## 2022-05-21 DIAGNOSIS — O0993 Supervision of high risk pregnancy, unspecified, third trimester: Secondary | ICD-10-CM

## 2022-05-21 DIAGNOSIS — O0990 Supervision of high risk pregnancy, unspecified, unspecified trimester: Secondary | ICD-10-CM

## 2022-05-21 DIAGNOSIS — R7303 Prediabetes: Secondary | ICD-10-CM

## 2022-05-21 DIAGNOSIS — O99213 Obesity complicating pregnancy, third trimester: Secondary | ICD-10-CM

## 2022-05-21 DIAGNOSIS — O35EXX0 Maternal care for other (suspected) fetal abnormality and damage, fetal genitourinary anomalies, not applicable or unspecified: Secondary | ICD-10-CM

## 2022-05-21 DIAGNOSIS — Z3A31 31 weeks gestation of pregnancy: Secondary | ICD-10-CM

## 2022-05-21 DIAGNOSIS — O24419 Gestational diabetes mellitus in pregnancy, unspecified control: Secondary | ICD-10-CM

## 2022-05-21 NOTE — Progress Notes (Signed)
   PRENATAL VISIT NOTE  Subjective:  Jane Jackson is a 36 y.o. G3P1011 at 67w1dbeing seen today for ongoing prenatal care.  She is currently monitored for the following issues for this high-risk pregnancy and has Maternal morbid obesity, antepartum ([place]; Uncomplicated asthma; Supervision of high risk pregnancy, antepartum; Prediabetes; Fetal multicystic dysplastic kidney affect care of mother, antepartum; and Gestational diabetes mellitus (GDM) affecting pregnancy, antepartum on their problem list.  Patient reports no complaints.  Contractions: Irritability. Vag. Bleeding: None.  Movement: Present. Denies leaking of fluid.   The following portions of the patient's history were reviewed and updated as appropriate: allergies, current medications, past family history, past medical history, past social history, past surgical history and problem list.   Objective:   Vitals:   05/21/22 1538  BP: 127/89  Pulse: (!) 114  Weight: (!) 318 lb (144.2 kg)    Fetal Status: Fetal Heart Rate (bpm): 154   Movement: Present     General:  Alert, oriented and cooperative. Patient is in no acute distress.  Skin: Skin is warm and dry. No rash noted.   Cardiovascular: Normal heart rate noted  Respiratory: Normal respiratory effort, no problems with respiration noted  Abdomen: Soft, gravid, appropriate for gestational age.  Pain/Pressure: Absent     Pelvic: Cervical exam deferred        Extremities: Normal range of motion.  Edema: Trace  Mental Status: Normal mood and affect. Normal behavior. Normal judgment and thought content.   Assessment and Plan:  Pregnancy: G3P1011 at 329w1d. Gestational diabetes mellitus (GDM) affecting pregnancy, antepartum Will monitor  Discussed that medications will help with fastings She would like to try more diet changes Discussed likelihood of needing medications.   2. Fetal multicystic dysplastic kidney affecting care of mother, antepartum, single or unspecified  fetus Has MFM follow up  3. Prediabetes  4. Supervision of high risk pregnancy, antepartum Up to date  5. Maternal morbid obesity, antepartum (HCC) TWG=18 lb (8.165 kg)    Preterm labor symptoms and general obstetric precautions including but not limited to vaginal bleeding, contractions, leaking of fluid and fetal movement were reviewed in detail with the patient. Please refer to After Visit Summary for other counseling recommendations.   Return in about 2 weeks (around 06/04/2022).  Future Appointments  Date Time Provider DeLaie9/13/2023  1:30 PM PrDonnamae JudeMD [facility]  06/08/2022  3:30 PM [facility] NURSE [facility]/15/2023  3:45 PM [facility] US1 [facility]06/15/2022  3:30 PM [facility] NURSE [facility]9/22/2023  3:45 PM [facility] US1 [facility]9/28/2023  2:30 PM Anyanwu, UgSallyanne HaversMD [facility]  06/22/2022  3:30 PM [facility] NURSE [facility]9/29/2023  3:45 PM [facility] US1 [facility]10/12/2021  2:30 PM PrDonnamae JudeMD [facility]  07/04/2022  2:30 PM PrDonnamae JudeMD [facility]  07/10/2022  2:30 PM Anyanwu, UgSallyanne HaversMD [facility]  07/16/2022  3:50 PM NeCaren MacadamMD [facility]    KiCaren MacadamMD

## 2022-05-21 NOTE — Progress Notes (Signed)
Not feeling well, feeling swollen all over   Having a hard time keeping fastings down

## 2022-06-06 ENCOUNTER — Ambulatory Visit (INDEPENDENT_AMBULATORY_CARE_PROVIDER_SITE_OTHER): Payer: 59 | Admitting: Family Medicine

## 2022-06-06 VITALS — BP 128/78 | HR 70 | Wt 317.0 lb

## 2022-06-06 DIAGNOSIS — O0990 Supervision of high risk pregnancy, unspecified, unspecified trimester: Secondary | ICD-10-CM

## 2022-06-06 DIAGNOSIS — O24419 Gestational diabetes mellitus in pregnancy, unspecified control: Secondary | ICD-10-CM | POA: Diagnosis not present

## 2022-06-06 DIAGNOSIS — O163 Unspecified maternal hypertension, third trimester: Secondary | ICD-10-CM | POA: Diagnosis not present

## 2022-06-06 DIAGNOSIS — O35EXX0 Maternal care for other (suspected) fetal abnormality and damage, fetal genitourinary anomalies, not applicable or unspecified: Secondary | ICD-10-CM

## 2022-06-06 MED ORDER — METFORMIN HCL 500 MG PO TABS: 500.0000 mg | ORAL_TABLET | Freq: Two times a day (BID) | ORAL | 2 refills | Status: DC

## 2022-06-06 NOTE — Progress Notes (Signed)
   PRENATAL VISIT NOTE  Subjective:  Jane Jackson is a 36 y.o. G3P1011 at 72w3dbeing seen today for ongoing prenatal care.  She is currently monitored for the following issues for this high-risk pregnancy and has Maternal morbid obesity, antepartum ([place]; Uncomplicated asthma; Supervision of high risk pregnancy, antepartum; Prediabetes; Fetal multicystic dysplastic kidney affect care of mother, antepartum; and Gestational diabetes mellitus (GDM) affecting pregnancy, antepartum on their problem list.  Patient reports no complaints.  Contractions: Not present. Vag. Bleeding: None.  Movement: Present. Denies leaking of fluid.   The following portions of the patient's history were reviewed and updated as appropriate: allergies, current medications, past family history, past medical history, past social history, past surgical history and problem list.   Objective:   Vitals:   06/06/22 1346 06/06/22 1429  BP: (!) 142/78 128/78  Pulse: 87 70  Weight: (!) 317 lb (143.8 kg)     Fetal Status:     Movement: Present     General:  Alert, oriented and cooperative. Patient is in no acute distress.  Skin: Skin is warm and dry. No rash noted.   Cardiovascular: Normal heart rate noted  Respiratory: Normal respiratory effort, no problems with respiration noted  Abdomen: Soft, gravid, appropriate for gestational age.  Pain/Pressure: Present     Pelvic: Cervical exam deferred        Extremities: Normal range of motion.  Edema: Trace  Mental Status: Normal mood and affect. Normal behavior. Normal judgment and thought content.  NST:  Baseline: 130 bpm, Variability: Good {> 6 bpm), Accelerations: Reactive, and Decelerations: Absent  Assessment and Plan:  Pregnancy: G3P1011 at 326w3d. Gestational diabetes mellitus (GDM) affecting pregnancy, antepartum Reports CBGs fastings are 100s and daytimes can be between 110-160 Begin metformin. Has LGA--begin testing. Reviewed risks of stillbirth - metFORMIN  (GLUCOPHAGE) 500 MG tablet; Take 1 tablet (500 mg total) by mouth 2 (two) times daily with a meal.  Dispense: 60 tablet; Refill: 2  2. Fetal multicystic dysplastic kidney affecting care of mother, antepartum, single or unspecified fetus Followed by MFM  3. Supervision of high risk pregnancy, antepartum - Fetal nonstress test  4. Elevated blood pressure affecting pregnancy in third trimester, antepartum Reports at home have been very high--will check baseline labs--better on re-check - CBC - Comprehensive metabolic panel - Protein / creatinine ratio, urine  Preterm labor symptoms and general obstetric precautions including but not limited to vaginal bleeding, contractions, leaking of fluid and fetal movement were reviewed in detail with the patient. Please refer to After Visit Summary for other counseling recommendations.   Return in 1 week (on 06/13/2022) [facility] and [facility]PP.  Future Appointments  Date Time Provider DeSt. Elmo9/15/2023  3:30 PM [facility] NURSE [facility]9/15/2023  3:45 PM [facility] US1 [facility]9/22/2023  3:30 PM [facility] NURSE [facility]9/22/2023  3:45 PM [facility] US1 [facility]9/28/2023  2:30 PM Anyanwu, UgSallyanne HaversMD [facility]  06/22/2022  3:30 PM [facility] NURSE [facility]9/29/2023  3:45 PM [facility] US1 [facility]MM[facility]10/10/2021  3:30 PM NeCaren MacadamMD [facility]  07/02/2022  2:50 PM NeCaren MacadamMD [facility]  07/09/2022  3:30 PM NeCaren MacadamMD [facility]  07/16/2022  3:50 PM NeErnestina PatchesKiJuanita CraverMD [facility]    TaDonnamae JudeMD

## 2022-06-06 NOTE — Progress Notes (Signed)
Baby's movement has been "quieter " the last few days   Fastings are now in the low hundreds

## 2022-06-07 LAB — COMPREHENSIVE METABOLIC PANEL
ALT: 8 IU/L (ref 0–32)
AST: 9 IU/L (ref 0–40)
Albumin/Globulin Ratio: 1.4 (ref 1.2–2.2)
Albumin: 3.3 g/dL — ABNORMAL LOW (ref 3.9–4.9)
Alkaline Phosphatase: 85 IU/L (ref 44–121)
BUN/Creatinine Ratio: 8 — ABNORMAL LOW (ref 9–23)
BUN: 5 mg/dL — ABNORMAL LOW (ref 6–20)
Bilirubin Total: <0.2 mg/dL (ref 0.0–1.2)
CO2: 19 mmol/L — ABNORMAL LOW (ref 20–29)
Calcium: 8.8 mg/dL (ref 8.7–10.2)
Chloride: 103 mmol/L (ref 96–106)
Creatinine, Ser: 0.59 mg/dL (ref 0.57–1.00)
Globulin, Total: 2.4 g/dL (ref 1.5–4.5)
Glucose: 111 mg/dL — ABNORMAL HIGH (ref 70–99)
Potassium: 4 mmol/L (ref 3.5–5.2)
Sodium: 137 mmol/L (ref 134–144)
Total Protein: 5.7 g/dL — ABNORMAL LOW (ref 6.0–8.5)
eGFR: 118 mL/min/{1.73_m2} (ref >59)

## 2022-06-07 LAB — CBC
Hematocrit: 36.5 % (ref 34.0–46.6)
Hemoglobin: 12.3 g/dL (ref 11.1–15.9)
MCH: 29.9 pg (ref 26.6–33.0)
MCHC: 33.7 g/dL (ref 31.5–35.7)
MCV: 89 fL (ref 79–97)
Platelets: 224 10*3/uL (ref 150–450)
RBC: 4.12 x10E6/uL (ref 3.77–5.28)
RDW: 13.3 % (ref 11.7–15.4)
WBC: 12.1 10*3/uL — ABNORMAL HIGH (ref 3.4–10.8)

## 2022-06-07 LAB — PROTEIN / CREATININE RATIO, URINE
Creatinine, Urine: 380.7 mg/dL
Protein, Ur: 38 mg/dL
Protein/Creat Ratio: 100 mg/g creat (ref 0–200)

## 2022-06-08 ENCOUNTER — Ambulatory Visit: Payer: 59 | Attending: Obstetrics

## 2022-06-08 ENCOUNTER — Ambulatory Visit: Payer: 59 | Admitting: *Deleted

## 2022-06-08 VITALS — BP 142/79 | HR 80

## 2022-06-08 DIAGNOSIS — O99413 Diseases of the circulatory system complicating pregnancy, third trimester: Secondary | ICD-10-CM | POA: Diagnosis not present

## 2022-06-08 DIAGNOSIS — Z3A33 33 weeks gestation of pregnancy: Secondary | ICD-10-CM | POA: Diagnosis not present

## 2022-06-08 DIAGNOSIS — O09523 Supervision of elderly multigravida, third trimester: Secondary | ICD-10-CM | POA: Diagnosis not present

## 2022-06-08 DIAGNOSIS — Z6841 Body Mass Index (BMI) 40.0 and over, adult: Secondary | ICD-10-CM

## 2022-06-08 DIAGNOSIS — O24415 Gestational diabetes mellitus in pregnancy, controlled by oral hypoglycemic drugs: Secondary | ICD-10-CM

## 2022-06-08 DIAGNOSIS — O24419 Gestational diabetes mellitus in pregnancy, unspecified control: Secondary | ICD-10-CM | POA: Diagnosis not present

## 2022-06-08 DIAGNOSIS — O24410 Gestational diabetes mellitus in pregnancy, diet controlled: Secondary | ICD-10-CM | POA: Insufficient documentation

## 2022-06-08 DIAGNOSIS — O35EXX1 Maternal care for other (suspected) fetal abnormality and damage, fetal genitourinary anomalies, fetus 1: Secondary | ICD-10-CM | POA: Diagnosis not present

## 2022-06-08 DIAGNOSIS — O35EXX0 Maternal care for other (suspected) fetal abnormality and damage, fetal genitourinary anomalies, not applicable or unspecified: Secondary | ICD-10-CM | POA: Diagnosis not present

## 2022-06-08 DIAGNOSIS — O0990 Supervision of high risk pregnancy, unspecified, unspecified trimester: Secondary | ICD-10-CM | POA: Diagnosis not present

## 2022-06-08 DIAGNOSIS — O99213 Obesity complicating pregnancy, third trimester: Secondary | ICD-10-CM

## 2022-06-11 ENCOUNTER — Other Ambulatory Visit: Payer: Self-pay | Admitting: *Deleted

## 2022-06-11 DIAGNOSIS — Q614 Renal dysplasia: Secondary | ICD-10-CM

## 2022-06-11 DIAGNOSIS — O09523 Supervision of elderly multigravida, third trimester: Secondary | ICD-10-CM

## 2022-06-11 DIAGNOSIS — O24419 Gestational diabetes mellitus in pregnancy, unspecified control: Secondary | ICD-10-CM

## 2022-06-11 DIAGNOSIS — Z6841 Body Mass Index (BMI) 40.0 and over, adult: Secondary | ICD-10-CM

## 2022-06-11 DIAGNOSIS — O99213 Obesity complicating pregnancy, third trimester: Secondary | ICD-10-CM

## 2022-06-15 ENCOUNTER — Ambulatory Visit: Payer: 59 | Admitting: *Deleted

## 2022-06-15 ENCOUNTER — Ambulatory Visit (HOSPITAL_BASED_OUTPATIENT_CLINIC_OR_DEPARTMENT_OTHER): Payer: 59

## 2022-06-15 ENCOUNTER — Inpatient Hospital Stay (HOSPITAL_COMMUNITY)
Admission: AD | Admit: 2022-06-15 | Discharge: 2022-06-15 | Disposition: A | Discharge status: Home / Self Care | Payer: 59 | Attending: Obstetrics and Gynecology | Admitting: Obstetrics and Gynecology

## 2022-06-15 ENCOUNTER — Encounter (HOSPITAL_COMMUNITY): Payer: Self-pay | Admitting: Obstetrics and Gynecology

## 2022-06-15 VITALS — BP 122/78 | HR 90

## 2022-06-15 DIAGNOSIS — O0990 Supervision of high risk pregnancy, unspecified, unspecified trimester: Secondary | ICD-10-CM | POA: Insufficient documentation

## 2022-06-15 DIAGNOSIS — O358XX0 Maternal care for other (suspected) fetal abnormality and damage, not applicable or unspecified: Secondary | ICD-10-CM | POA: Diagnosis not present

## 2022-06-15 DIAGNOSIS — Z3A34 34 weeks gestation of pregnancy: Secondary | ICD-10-CM

## 2022-06-15 DIAGNOSIS — E669 Obesity, unspecified: Secondary | ICD-10-CM

## 2022-06-15 DIAGNOSIS — O368130 Decreased fetal movements, third trimester, not applicable or unspecified: Secondary | ICD-10-CM | POA: Diagnosis present

## 2022-06-15 DIAGNOSIS — O99213 Obesity complicating pregnancy, third trimester: Secondary | ICD-10-CM

## 2022-06-15 DIAGNOSIS — O24419 Gestational diabetes mellitus in pregnancy, unspecified control: Secondary | ICD-10-CM

## 2022-06-15 DIAGNOSIS — O09523 Supervision of elderly multigravida, third trimester: Secondary | ICD-10-CM | POA: Insufficient documentation

## 2022-06-15 DIAGNOSIS — O133 Gestational [pregnancy-induced] hypertension without significant proteinuria, third trimester: Secondary | ICD-10-CM | POA: Diagnosis not present

## 2022-06-15 DIAGNOSIS — Z3689 Encounter for other specified antenatal screening: Secondary | ICD-10-CM

## 2022-06-15 DIAGNOSIS — O35EXX0 Maternal care for other (suspected) fetal abnormality and damage, fetal genitourinary anomalies, not applicable or unspecified: Secondary | ICD-10-CM | POA: Diagnosis not present

## 2022-06-15 DIAGNOSIS — O99413 Diseases of the circulatory system complicating pregnancy, third trimester: Secondary | ICD-10-CM | POA: Diagnosis not present

## 2022-06-15 DIAGNOSIS — O26833 Pregnancy related renal disease, third trimester: Secondary | ICD-10-CM | POA: Insufficient documentation

## 2022-06-15 DIAGNOSIS — N281 Cyst of kidney, acquired: Secondary | ICD-10-CM | POA: Insufficient documentation

## 2022-06-15 DIAGNOSIS — O35EXX1 Maternal care for other (suspected) fetal abnormality and damage, fetal genitourinary anomalies, fetus 1: Secondary | ICD-10-CM | POA: Insufficient documentation

## 2022-06-15 DIAGNOSIS — O26893 Other specified pregnancy related conditions, third trimester: Secondary | ICD-10-CM | POA: Diagnosis not present

## 2022-06-15 DIAGNOSIS — I341 Nonrheumatic mitral (valve) prolapse: Secondary | ICD-10-CM | POA: Diagnosis not present

## 2022-06-15 DIAGNOSIS — Z6841 Body Mass Index (BMI) 40.0 and over, adult: Secondary | ICD-10-CM | POA: Insufficient documentation

## 2022-06-15 DIAGNOSIS — O139 Gestational [pregnancy-induced] hypertension without significant proteinuria, unspecified trimester: Secondary | ICD-10-CM | POA: Insufficient documentation

## 2022-06-15 DIAGNOSIS — O24410 Gestational diabetes mellitus in pregnancy, diet controlled: Secondary | ICD-10-CM

## 2022-06-15 DIAGNOSIS — O131 Gestational [pregnancy-induced] hypertension without significant proteinuria, first trimester: Secondary | ICD-10-CM | POA: Diagnosis not present

## 2022-06-15 LAB — COMPREHENSIVE METABOLIC PANEL
ALT: 8 U/L (ref 0–44)
AST: 19 U/L (ref 15–41)
Albumin: 2.6 g/dL — ABNORMAL LOW (ref 3.5–5.0)
Alkaline Phosphatase: 80 U/L (ref 38–126)
Anion gap: 11 (ref 5–15)
BUN: 6 mg/dL (ref 6–20)
CO2: 20 mmol/L — ABNORMAL LOW (ref 22–32)
Calcium: 9.1 mg/dL (ref 8.9–10.3)
Chloride: 104 mmol/L (ref 98–111)
Creatinine, Ser: 0.68 mg/dL (ref 0.44–1.00)
GFR, Estimated: >60 mL/min (ref >60)
Glucose, Bld: 88 mg/dL (ref 70–99)
Potassium: 4.2 mmol/L (ref 3.5–5.1)
Sodium: 135 mmol/L (ref 135–145)
Total Bilirubin: 0.7 mg/dL (ref 0.3–1.2)
Total Protein: 5.8 g/dL — ABNORMAL LOW (ref 6.5–8.1)

## 2022-06-15 LAB — URINALYSIS, ROUTINE W REFLEX MICROSCOPIC
Bilirubin Urine: NEGATIVE
Glucose, UA: NEGATIVE mg/dL
Hgb urine dipstick: NEGATIVE
Ketones, ur: 5 mg/dL — AB
Nitrite: NEGATIVE
Protein, ur: NEGATIVE mg/dL
Specific Gravity, Urine: 1.016 (ref 1.005–1.030)
pH: 6 (ref 5.0–8.0)

## 2022-06-15 LAB — POCT FERN TEST: POCT Fern Test: NEGATIVE

## 2022-06-15 LAB — AMNISURE RUPTURE OF MEMBRANE (ROM) NOT AT ARMC: Amnisure ROM: NEGATIVE

## 2022-06-15 LAB — PROTEIN / CREATININE RATIO, URINE
Creatinine, Urine: 134 mg/dL
Protein Creatinine Ratio: 0.08 mg/mg{Cre} (ref 0.00–0.15)
Total Protein, Urine: 11 mg/dL

## 2022-06-15 LAB — CBC
HCT: 38.7 % (ref 36.0–46.0)
Hemoglobin: 13.2 g/dL (ref 12.0–15.0)
MCH: 30 pg (ref 26.0–34.0)
MCHC: 34.1 g/dL (ref 30.0–36.0)
MCV: 88 fL (ref 80.0–100.0)
Platelets: 244 10*3/uL (ref 150–400)
RBC: 4.4 MIL/uL (ref 3.87–5.11)
RDW: 13.2 % (ref 11.5–15.5)
WBC: 13 10*3/uL — ABNORMAL HIGH (ref 4.0–10.5)
nRBC: 0 % (ref 0.0–0.2)

## 2022-06-15 LAB — WET PREP, GENITAL
Clue Cells Wet Prep HPF POC: NONE SEEN
Sperm: NONE SEEN
Trich, Wet Prep: NONE SEEN
WBC, Wet Prep HPF POC: <10 (ref <10)
Yeast Wet Prep HPF POC: NONE SEEN

## 2022-06-15 NOTE — MAU Note (Addendum)
...  Jane Jackson is a 36 y.o. at 62w5dhere in MAU reporting: Sent over from the office for an NST and r/o ROM. She reports "I constantly pee on myself but last night was different. I woke up and had fluid that was in my buttcrack and on my underwear." She reports she has not felt any continual leaking of fluids but has occasionally felt "wet" today. Denies VB. Patient reports feeling movement in the MAU lobby. She reports the movement's aren't as strong as usual. She is also endorsing vaginal pressure. She reports at times the baby will move into a different position and she experiences some relief. Denies any other pains currently.  Fetal movement clicker given.  Patient had a BPP in office. 6/8 for breathing. Subjectively low-normal amniotic fluid. AFI 8.37 cm. Anterior placenta. Baby has a unilateral multicystic kidney.  Pain score: 3/10 vaginal pressure  FHT: initial FHT 118 Lab orders placed from triage: UA

## 2022-06-15 NOTE — MAU Provider Note (Cosign Needed Addendum)
History    CSN: [id number]  Arrival date and time: 06/15/22 1718   None    Chief Complaint  Patient presents with   Vaginal Pain   Decreased Fetal Movement   Jane Jackson is a 36 y.o. G3P1011 at 23w5dat MAU for DFM and LOF. She is currently monitored for high-risk pregnancy, A2GDM, and fetal unilateral multicystic dysplastic kidney. [facility] A[facility] of 8.37[facility] and an unusual excess LOF last night. Patient reports normally wearing pads and woke up today with more fluid than usual. Pt is unsure if the leakage has been continuous due to always wearing pads. This constellation of findings is why the patient came for further work up to MAU to rule out ROM. She also reports generalized vaginal pressure that is amplified by FM. Patient has had high BP readings, reports her at home pressures are as high as 1102systolic but quickly resolve to 130's when she relaxes.    Pt denies headache, RUQ pain, dizziness, dyspnea, CP, SOB, HA, vaginal bleeding, vaginal itching/burning, urinary symptoms, n/v, constipation, diarrhea, or fever/chills.    Vaginal Pain The patient's pertinent negatives include no vaginal discharge. Pertinent negatives include no abdominal pain or headaches.    OB History     Gravida  3   Para  1   Term  1   Preterm      AB  1   Living  1      SAB  1   IAB      Ectopic      Multiple  0   Live Births  1           Past Medical History:  Diagnosis Date   Anxiety    Asthma    Chicken pox    Complication of anesthesia    issue with asthma and intubation   Cystic dysplasia of one kidney    about 4 months ago-no f/u- largest 552m   Diverticulitis    Family history of cystic fibrosis    Normal CF screen   Family history of mental retardation    Fibroid, uterine    dx a few months ago   GERD (gastroesophageal reflux disease)    H/O mitral valve prolapse    as a child   History of mitral valve  prolapse 09/16/2016   Imp[place] bowel [place]   Inappropriate sinus tachycardia    a. exacerbated by pregnancy - 2018; b. 01/2017 Holter: 29% of time in sinus tachycardia;  c. 02/2017 Echo: EF 65-70%, no rwma; c. 01/2017 Holter: 29% of time in sinus tachycardia.   Kidney stones    Upper respiratory tract infection 05/27/2020    Past Surgical History:  Procedure Laterality Date   APPENDECTOMY     CHOLECYSTECTOMY N/A 01/18/2016   Procedure: LAPAROSCOPIC CHOLECYSTECTOMY WITH INTRAOPERATIVE CHOLANGIOGRAM;  Surgeon: BeExcell SeltzerMD;  Location: [facility];  Service: General;  Laterality: N/A;   ruptured cyst     TONSILLECTOMY AND ADENOIDECTOMY     age [age] TYMPANOSTOMY TUBE PLACEMENT      Family History  Problem Relation Age of Onset   Arthritis Mother    Hypertension Mother    Diabetes Mother    Cervical cancer Mother    Alcohol abuse Father    Arthritis Father    Hypertension Father    Arthritis Maternal Grandmother    Heart disease Maternal Grandmother    Stroke Maternal Grandmother  Hypertension Maternal Grandmother    Diabetes Maternal Grandmother    Arthritis Maternal Grandfather    Colon cancer Maternal Grandfather    Prostate cancer Maternal Grandfather    Heart disease Maternal Grandfather    Stroke Maternal Grandfather    Hypertension Maternal Grandfather    Arthritis Paternal Grandmother    Heart disease Paternal Grandmother    Hypertension Paternal Grandmother    Diabetes Paternal Grandmother    Arthritis Paternal Grandfather    Heart disease Paternal Grandfather    Hypertension Paternal Grandfather    Thyroid disease Maternal Aunt        thyroid cancer    Social History   Tobacco Use   Smoking status: Former    Types: Cigarettes    Quit date: 03/03/2008    Years since quitting: 14.2   Smokeless tobacco: Never   Tobacco comments:    Smoked occ, no smoking for years, per pt  Vaping Use   Vaping Use: Never used  Substance Use Topics   Alcohol use: Not  Currently    Comment: last use 10/28/2021 "one drink"   Drug use: No    Allergies:  Allergies  Allergen Reactions   Latex Itching and Rash   Fentanyl Hives    Medications Prior to Admission  Medication Sig Dispense Refill Last Dose   aspirin 81 MG chewable tablet Chew 1 tablet (81 mg total) by mouth daily. 90 tablet 2 06/14/2022   metFORMIN (GLUCOPHAGE) 500 MG tablet Take 1 tablet (500 mg total) by mouth 2 (two) times daily with a meal. 60 tablet 2 06/14/2022   albuterol (VENTOLIN HFA) 108 (90 Base) MCG/ACT inhaler Inhale into the lungs.      Continuous Blood Gluc Sensor (DEXCOM G6 SENSOR) MISC Place one sensor every 10 days 3 each 12    Continuous Blood Gluc Transmit (DEXCOM G6 TRANSMITTER) MISC 1 Units by Does not apply route every 3 (three) months. 1 each 6     Review of Systems  Cardiovascular:  Negative for chest pain and leg swelling.  Gastrointestinal:  Negative for abdominal pain.  Genitourinary:  Positive for vaginal pain. Negative for vaginal bleeding and vaginal discharge.  Neurological:  Negative for headaches.   Physical Exam   Blood pressure 133/82, pulse 98, temperature 98.7 F (37.1 C), temperature source Oral, resp. rate 17, height '5\' 4"'$  (1.626 m), weight (!) 146.1 kg, last menstrual period 10/15/2021, SpO2 96 %.  Physical Exam Constitutional:      Appearance: Normal appearance.  HENT:     Head: Normocephalic and atraumatic.  Cardiovascular:     Rate and Rhythm: Normal rate and regular rhythm.  Pulmonary:     Effort: Pulmonary effort is normal.  Neurological:     Mental Status: She is alert.   Fetal monitoring:  Baseline: 130 bpm,  Variability: Good {> 6 bpm), Accelerations: 15x15 Decelerations: Absent Toco: every 2-6 min, 40-50 sec duration  MAU Course  Procedures  MDM Fern test Wet prep, genital Amnisure UA CBC CMP Protein/creatinine ratio   Results pending. Care handed over.   Results for orders placed or performed during the hospital  encounter of 06/15/22 (from the past 24 hour(s))  Urinalysis, Routine w reflex microscopic Urine, Clean Catch     Status: Abnormal   Collection Time: 06/15/22  5:40 PM  Result Value Ref Range   Color, Urine YELLOW YELLOW   APPearance HAZY (A) CLEAR   Specific Gravity, Urine 1.016 1.005 - 1.030   pH 6.0 5.0 - 8.0  Glucose, UA NEGATIVE NEGATIVE mg/dL   Hgb urine dipstick NEGATIVE NEGATIVE   Bilirubin Urine NEGATIVE NEGATIVE   Ketones, ur 5 (A) NEGATIVE mg/dL   Protein, ur NEGATIVE NEGATIVE mg/dL   Nitrite NEGATIVE NEGATIVE   Leukocytes,Ua TRACE (A) NEGATIVE   RBC / HPF 6-10 0 - 5 RBC/hpf   WBC, UA 6-10 0 - 5 WBC/hpf   Bacteria, UA MANY (A) NONE SEEN   Squamous Epithelial / LPF 0-5 0 - 5   Mucus PRESENT    Amorphous Crystal PRESENT   Protein / creatinine ratio, urine     Status: None   Collection Time: 06/15/22  6:39 PM  Result Value Ref Range   Creatinine, Urine 134 mg/dL   Total Protein, Urine 11 mg/dL   Protein Creatinine Ratio 0.08 0.00 - 0.15 mg/mg[Cre]  Wet prep, genital     Status: None   Collection Time: 06/15/22  7:15 PM  Result Value Ref Range   Yeast Wet Prep HPF POC NONE SEEN NONE SEEN   Trich, Wet Prep NONE SEEN NONE SEEN   Clue Cells Wet Prep HPF POC NONE SEEN NONE SEEN   WBC, Wet Prep HPF POC <10 <10   Sperm NONE SEEN   Fern Test     Status: None   Collection Time: 06/15/22  7:22 PM  Result Value Ref Range   POCT Fern Test Negative = intact amniotic membranes     Glorious Peach 06/15/22, 7:49 PM   CNM attestation:  I have seen and examined this patient and agree with above documentation in the medical student's note.   Jane Jackson is a 36 y.o. G3P1011 at 33w5dthat was sent from MFM today for rule out rupture of membranes and decreased fetal movement. Patient was seen at MFM today and AFI was noted to be subjectively low. She reports that she usually has to wear pads as she sometimes leaks urine, however last night she had a trickle of fluid that felt  different. She reports underwear have been wet and has not had any big gushes of fluid. She reports fetal movement was decreased earlier today, however since her arrival to MAU has had normal active fetal movement. Has had some mild cramping. She denies vaginal bleeding.  Of note, she reports she has had some elevated BP's at home, the highest systolic readings being in the 180s. This is the first time that she has reported this. She is currently asymptomatic, denying headaches, vision changes, or RUQ/epigastric pain.   PE: Patient Vitals for the past 24 hrs:  BP Temp Temp src Pulse Resp SpO2 Height Weight  06/15/22 2100 131/68 -- -- 99 -- 97 % -- --  06/15/22 2045 (!) 142/77 -- -- (!) 101 -- 98 % -- --  06/15/22 2030 137/81 -- -- (!) 101 -- 98 % -- --  06/15/22 2015 (!) 136/90 -- -- 98 -- 98 % -- --  06/15/22 1945 (!) 150/82 -- -- 75 -- -- -- --  06/15/22 1930 (!) 141/81 -- -- 82 -- -- -- --  06/15/22 1900 133/82 -- -- 98 -- 96 % -- --  06/15/22 1846 129/79 -- -- 92 -- -- -- --  06/15/22 1831 123/72 -- -- (!) 114 -- -- -- --  06/15/22 1806 (!) 140/79 98.7 F (37.1 C) Oral 97 17 99 % -- --  06/15/22 1749 -- -- -- -- -- -- '5\' 4"'$  (1.626 m) (!) 146.1 kg     Gen: calm comfortable, NAD Resp: normal  effort, no distress Heart: regular rate Abdomen: soft, non-tender, gravid Pelvic: normal external female genitalia, vaginal walls pink with rugae, some pooling of watery discharge, no bleeding, cervix visually closed without lesions/masses  Dilation: Fingertip Effacement (%): Thick Exam by:: Maryagnes Amos, CNM   FHR: Baseline 130bpm, moderate variability, +15x15 accels, no decels Toco: UC's Q2-39mns  ROS, labs, PMH reviewed  Orders Placed This Encounter  Procedures   Wet prep, genital   Urinalysis, Routine w reflex microscopic Urine, Clean Catch   Am[facility]isure [facility]  CBC   Comprehensive metabolic panel   Protein / creatinine ratio, urine   Fern Test    Results for orders placed or performed during the hospital encounter of 06/15/22 (from the past 24 hour(s))  Urinalysis, Routine w reflex microscopic Urine, Clean Catch     Status: Abnormal   Collection Time: 06/15/22  5:40 PM  Result Value Ref Range   Color, Urine YELLOW YELLOW   APPearance HAZY (A) CLEAR   Specific Gravity, Urine 1.016 1.005 - 1.030   pH 6.0 5.0 - 8.0   Glucose, UA NEGATIVE NEGATIVE mg/dL   Hgb urine dipstick NEGATIVE NEGATIVE   Bilirubin Urine NEGATIVE NEGATIVE   Ketones, ur 5 (A) NEGATIVE mg/dL   Protein, ur NEGATIVE NEGATIVE mg/dL   Nitrite NEGATIVE NEGATIVE   Leukocytes,Ua TRACE (A) NEGATIVE   RBC / HPF 6-10 0 - 5 RBC/hpf   WBC, UA 6-10 0 - 5 WBC/hpf   Bacteria, UA MANY (A) NONE SEEN   Squamous Epithelial / LPF 0-5 0 - 5   Mucus PRESENT    Amorphous Crystal PRESENT   Protein / creatinine ratio, urine     Status: None   Collection Time: 06/15/22  6:39 PM  Result Value Ref Range   Creatinine, Urine 134 mg/dL   Total Protein, Urine 11 mg/dL   Protein Creatinine Ratio 0.08 0.00 - 0.15 mg/mg[Cre]  Amnisure [facility]    Status: None   Collection Time: 06/15/22  7:15 PM  Result Value Ref Range   Amnisure ROM NEGATIVE   Wet prep, genital     Status: None   Collection Time: 06/15/22  7:15 PM  Result Value Ref Range   Yeast Wet Prep HPF POC NONE SEEN NONE SEEN   Trich, Wet Prep NONE SEEN NONE SEEN   Clue Cells Wet Prep HPF POC NONE SEEN NONE SEEN   WBC, Wet Prep HPF POC <10 <10   Sperm NONE SEEN   Fern Test     Status: None   Collection Time: 06/15/22  7:22 PM  Result Value Ref Range   POCT Fern Test Negative = intact amniotic membranes   CBC     Status: Abnormal   Collection Time: 06/15/22  7:34 PM  Result Value Ref Range   WBC 13.0 (H) 4.0 - 10.5 K/uL   RBC 4.40 3.87 - 5.11 MIL/uL   Hemoglobin 13.2 12.0 - 15.0 g/dL   HCT 38.7 36.0 - 46.0 %   MCV 88.0 80.0 - 100.0 fL   MCH 30.0 26.0 - 34.0 pg   MCHC 34.1 30.0 - 36.0  g/dL   RDW 13.2 11.5 - 15.5 %   Platelets 244 150 - 400 K/uL   nRBC 0.0 0.0 - 0.2 %  Comprehensive metabolic panel     Status: Abnormal   Collection Time: 06/15/22  7:34 PM  Result Value Ref Range   Sodium 135 135 - 145 mmol/L  Potassium 4.2 3.5 - 5.1 mmol/L   Chloride 104 98 - 111 mmol/L   CO2 20 (L) 22 - 32 mmol/L   Glucose, Bld 88 70 - 99 mg/dL   BUN 6 6 - 20 mg/dL   Creatinine, Ser 0.68 0.44 - 1.00 mg/dL   Calcium 9.1 8.9 - 10.3 mg/dL   Total Protein 5.8 (L) 6.5 - 8.1 g/dL   Albumin 2.6 (L) 3.5 - 5.0 g/dL   AST 19 15 - 41 U/L   ALT 8 0 - 44 U/L   Alkaline Phosphatase 80 38 - 126 U/L   Total Bilirubin 0.7 0.3 - 1.2 mg/dL   GFR, Estimated >60 >60 mL/min   Anion gap 11 5 - 15    MDM CBC, CMP, UPCR Amnisure Serial BP's NST  Sterile speculum exam with small amount of watery discharge pooling. Wet prep and amnisure collected. Fern slide negative. Cervix FT/thick. NST reactive and reassuring. She is endorsing normal fetal movement since arrival to MAU. Has clicked the NST button >10 times. Labs pending. Care handed over to [facility]. Drake Leach, CNM at [address], [place] 06/15/2022 8:14 PM   Labs reviewed. No signs of SROM. Feeling good FM now. Meets criteria for gHTN, no signs of PEC. Stable for discharge home.  Assessment and Plan  [redacted] weeks gestation Reactive NST Gestational HTN Discharge home Follow up at [place] as scheduled PEC precautions  Allergies as of 06/15/2022       Reactions   Latex Itching, Rash   Fentanyl Hives        Medication List     TAKE these medications    albuterol 108 (90 Base) MCG/ACT inhaler Commonly known as: VENTOLIN HFA Inhale into the lungs.   aspirin 81 MG chewable tablet Chew 1 tablet (81 mg total) by mouth daily.   Dexcom G6 Sensor Misc Place one sensor every 10 days   Dexcom G6 Transmitter Misc 1 Units by Does not apply route every 3 (three) months.   metFORMIN 500 MG tablet Commonly known as: Glucophage Take 1  tablet (500 mg total) by mouth 2 (two) times daily with a meal.       Julianne Handler, CNM  06/15/2022 9:38 PM

## 2022-06-15 NOTE — Progress Notes (Signed)
Pt noticed a marked difference in her babys movements after being put on metformin. She went off for a day and her baby was active once again. Since resuming the metformin, the baby has been quiet again.

## 2022-06-17 LAB — CULTURE, OB URINE

## 2022-06-21 ENCOUNTER — Ambulatory Visit (INDEPENDENT_AMBULATORY_CARE_PROVIDER_SITE_OTHER): Payer: 59 | Admitting: Obstetrics & Gynecology

## 2022-06-21 VITALS — BP 137/94 | HR 112 | Wt 323.0 lb

## 2022-06-21 DIAGNOSIS — Z3A35 35 weeks gestation of pregnancy: Secondary | ICD-10-CM

## 2022-06-21 DIAGNOSIS — O24419 Gestational diabetes mellitus in pregnancy, unspecified control: Secondary | ICD-10-CM

## 2022-06-21 DIAGNOSIS — O99210 Obesity complicating pregnancy, unspecified trimester: Secondary | ICD-10-CM

## 2022-06-21 DIAGNOSIS — O133 Gestational [pregnancy-induced] hypertension without significant proteinuria, third trimester: Secondary | ICD-10-CM

## 2022-06-21 DIAGNOSIS — O0990 Supervision of high risk pregnancy, unspecified, unspecified trimester: Secondary | ICD-10-CM

## 2022-06-21 NOTE — Progress Notes (Signed)
   PRENATAL VISIT NOTE  Subjective:  Jane Jackson is a 36 y.o. G3P1011 at 25w4dbeing seen today for ongoing prenatal care.  She is currently monitored for the following issues for this high-risk pregnancy and has Maternal morbid obesity, antepartum ([place]; Uncomplicated asthma; Supervision of high risk pregnancy, antepartum; Prediabetes; Fetal multicystic dysplastic kidney affect care of mother, antepartum; Gestational diabetes mellitus (GDM) affecting pregnancy, antepartum; and Gestational hypertension on their problem list.  Patient reports no complaints.  Contractions: Not present. Vag. Bleeding: None.  Movement: Present. Denies leaking of fluid.   The following portions of the patient's history were reviewed and updated as appropriate: allergies, current medications, past family history, past medical history, past social history, past surgical history and problem list.   Objective:   Vitals:   06/21/22 1438  BP: 113/76  Pulse: 73    Fetal Status: Fetal Heart Rate (bpm): 141   Movement: Present     General:  Alert, oriented and cooperative. Patient is in no acute distress.  Skin: Skin is warm and dry. No rash noted.   Cardiovascular: Normal heart rate noted  Respiratory: Normal respiratory effort, no problems with respiration noted  Abdomen: Soft, gravid, appropriate for gestational age.  Pain/Pressure: Present     Pelvic: Cervical exam deferred        Extremities: Normal range of motion.  Edema: Trace  Mental Status: Normal mood and affect. Normal behavior. Normal judgment and thought content.   Assessment and Plan:  Pregnancy: G3P1011 at 323w4d. Gestational diabetes mellitus (GDM) affecting pregnancy, antepartum CGM reviewed, within range. Continue Metformin, weekly BPPs  2. Gestational hypertension, third trimester Mild range BP, no severe features. IOL scheduled on 07/01/22 at midnight  3. Maternal morbid obesity, antepartum (HCC) 4. [redacted] weeks gestation of pregnancy 5.  Supervision of high risk pregnancy, antepartum Pelvic cultures next visit, patient was rushing to a job interview! Preterm labor symptoms and general obstetric precautions including but not limited to vaginal bleeding, contractions, leaking of fluid and fetal movement were reviewed in detail with the patient. Please refer to After Visit Summary for other counseling recommendations.   Return in about 1 week (around 06/28/2022) for OFFICE OB VISIT (MD only).  Future Appointments  Date Time Provider [place]9/29/2023  3:30 PM [facility]/29/2023  3:45 PM [facility] US1 [facility]10/10/2021  3:30 PM NeCaren MacadamMD [facility]  06/27/2022  1:30 PM [facility] NURSE [facility]10/12/2021  1:45 PM [facility] US4 [facility]10/05/2022  2:50 PM NeCaren MacadamMD [facility]  07/06/2022  2:15 PM [facility] NURSE [facility]10/13/2023  2:30 PM [facility] US3 [facility]/16/2023  3:30 PM NeCaren MacadamMD [facility]  07/16/2022  3:50 PM NeErnestina PatchesKiJuanita CraverMD [facility]    UgVerita SchneidersMD

## 2022-06-21 NOTE — Patient Instructions (Signed)
Return to office for any scheduled appointments. Call the office or go to the MAU at [facility] if: You begin to have strong, frequent contractions Your water breaks.  Sometimes it is a big gush of fluid, sometimes it is just a trickle that keeps getting your underwear wet or running down your legs You have vaginal bleeding.  It is normal to have a small amount of spotting if your cervix was checked.  You do not feel your baby moving like normal.  If you do not, get something to eat and drink and lay down and focus on feeling your baby move.   If your baby is still not moving like normal, you should call the office or go to MAU. Any other obstetric concerns.  

## 2022-06-22 ENCOUNTER — Ambulatory Visit: Payer: 59 | Attending: Maternal & Fetal Medicine

## 2022-06-22 ENCOUNTER — Ambulatory Visit: Payer: 59 | Admitting: *Deleted

## 2022-06-22 ENCOUNTER — Telehealth (HOSPITAL_COMMUNITY): Payer: Self-pay | Admitting: *Deleted

## 2022-06-22 ENCOUNTER — Encounter (HOSPITAL_COMMUNITY): Payer: Self-pay

## 2022-06-22 VITALS — BP 128/79 | HR 98

## 2022-06-22 DIAGNOSIS — O133 Gestational [pregnancy-induced] hypertension without significant proteinuria, third trimester: Secondary | ICD-10-CM | POA: Diagnosis not present

## 2022-06-22 DIAGNOSIS — O35EXX0 Maternal care for other (suspected) fetal abnormality and damage, fetal genitourinary anomalies, not applicable or unspecified: Secondary | ICD-10-CM

## 2022-06-22 DIAGNOSIS — O35EXX1 Maternal care for other (suspected) fetal abnormality and damage, fetal genitourinary anomalies, fetus 1: Secondary | ICD-10-CM | POA: Diagnosis not present

## 2022-06-22 DIAGNOSIS — O99213 Obesity complicating pregnancy, third trimester: Secondary | ICD-10-CM | POA: Diagnosis not present

## 2022-06-22 DIAGNOSIS — O24410 Gestational diabetes mellitus in pregnancy, diet controlled: Secondary | ICD-10-CM | POA: Diagnosis not present

## 2022-06-22 DIAGNOSIS — I341 Nonrheumatic mitral (valve) prolapse: Secondary | ICD-10-CM

## 2022-06-22 DIAGNOSIS — O0990 Supervision of high risk pregnancy, unspecified, unspecified trimester: Secondary | ICD-10-CM | POA: Diagnosis not present

## 2022-06-22 DIAGNOSIS — E669 Obesity, unspecified: Secondary | ICD-10-CM | POA: Diagnosis not present

## 2022-06-22 DIAGNOSIS — O24419 Gestational diabetes mellitus in pregnancy, unspecified control: Secondary | ICD-10-CM

## 2022-06-22 DIAGNOSIS — Z3A35 35 weeks gestation of pregnancy: Secondary | ICD-10-CM | POA: Diagnosis not present

## 2022-06-22 DIAGNOSIS — O24415 Gestational diabetes mellitus in pregnancy, controlled by oral hypoglycemic drugs: Secondary | ICD-10-CM | POA: Diagnosis not present

## 2022-06-22 DIAGNOSIS — O09523 Supervision of elderly multigravida, third trimester: Secondary | ICD-10-CM | POA: Diagnosis not present

## 2022-06-22 DIAGNOSIS — Z6841 Body Mass Index (BMI) 40.0 and over, adult: Secondary | ICD-10-CM | POA: Insufficient documentation

## 2022-06-22 DIAGNOSIS — O99413 Diseases of the circulatory system complicating pregnancy, third trimester: Secondary | ICD-10-CM

## 2022-06-22 NOTE — Telephone Encounter (Signed)
Preadmission screen  

## 2022-06-24 ENCOUNTER — Other Ambulatory Visit: Payer: Self-pay | Admitting: Advanced Practice Midwife

## 2022-06-25 ENCOUNTER — Encounter: Payer: Self-pay | Admitting: Family Medicine

## 2022-06-25 ENCOUNTER — Other Ambulatory Visit: Payer: Self-pay | Admitting: *Deleted

## 2022-06-25 ENCOUNTER — Other Ambulatory Visit (HOSPITAL_COMMUNITY)
Admission: RE | Admit: 2022-06-25 | Discharge: 2022-06-25 | Disposition: A | Discharge status: Home / Self Care | Payer: 59 | Source: Ambulatory Visit | Attending: Family Medicine | Admitting: Family Medicine

## 2022-06-25 ENCOUNTER — Encounter: Payer: 59 | Admitting: Family Medicine

## 2022-06-25 ENCOUNTER — Ambulatory Visit (INDEPENDENT_AMBULATORY_CARE_PROVIDER_SITE_OTHER): Payer: 59 | Admitting: Family Medicine

## 2022-06-25 VITALS — BP 118/85 | HR 90 | Wt 324.0 lb

## 2022-06-25 DIAGNOSIS — O99213 Obesity complicating pregnancy, third trimester: Secondary | ICD-10-CM

## 2022-06-25 DIAGNOSIS — O0990 Supervision of high risk pregnancy, unspecified, unspecified trimester: Secondary | ICD-10-CM

## 2022-06-25 DIAGNOSIS — R638 Other symptoms and signs concerning food and fluid intake: Secondary | ICD-10-CM

## 2022-06-25 DIAGNOSIS — O24419 Gestational diabetes mellitus in pregnancy, unspecified control: Secondary | ICD-10-CM

## 2022-06-25 DIAGNOSIS — R7303 Prediabetes: Secondary | ICD-10-CM

## 2022-06-25 DIAGNOSIS — Z3A36 36 weeks gestation of pregnancy: Secondary | ICD-10-CM

## 2022-06-25 DIAGNOSIS — O35EXX0 Maternal care for other (suspected) fetal abnormality and damage, fetal genitourinary anomalies, not applicable or unspecified: Secondary | ICD-10-CM

## 2022-06-25 DIAGNOSIS — O133 Gestational [pregnancy-induced] hypertension without significant proteinuria, third trimester: Secondary | ICD-10-CM

## 2022-06-25 DIAGNOSIS — O99210 Obesity complicating pregnancy, unspecified trimester: Secondary | ICD-10-CM

## 2022-06-25 NOTE — Progress Notes (Signed)
   PRENATAL VISIT NOTE  Subjective:  Jane Jackson is a 36 y.o. G3P1011 at 39w1dbeing seen today for ongoing prenatal care.  She is currently monitored for the following issues for this high-risk pregnancy and has Maternal morbid obesity, antepartum ([place]; Uncomplicated asthma; Supervision of high risk pregnancy, antepartum; Prediabetes; Fetal multicystic dysplastic kidney affect care of mother, antepartum; Gestational diabetes mellitus (GDM) affecting pregnancy, antepartum; and Gestational hypertension on their problem list.  Patient reports no complaints.  Contractions: Irritability. Vag. Bleeding: None.  Movement: Present. Denies leaking of fluid.   The following portions of the patient's history were reviewed and updated as appropriate: allergies, current medications, past family history, past medical history, past social history, past surgical history and problem list.   Objective:   Vitals:   06/25/22 1548  BP: 118/85  Pulse: 90  Weight: (!) 324 lb (147 kg)    Fetal Status: Fetal Heart Rate (bpm): 128   Movement: Present  Presentation: Vertex  General:  Alert, oriented and cooperative. Patient is in no acute distress.  Skin: Skin is warm and dry. No rash noted.   Cardiovascular: Normal heart rate noted  Respiratory: Normal respiratory effort, no problems with respiration noted  Abdomen: Soft, gravid, appropriate for gestational age.  Pain/Pressure: Present     Pelvic: Cervical exam deferred Dilation: 1 Effacement (%): Thick Station: -3  Extremities: Normal range of motion.  Edema: None  Mental Status: Normal mood and affect. Normal behavior. Normal judgment and thought content.   Assessment and Plan:  Pregnancy: G3P1011 at 370w1d1. Gestational hypertension, third trimester BP today is incredibly normal.  Patient wanted to discuss slightly later IOL to help increase the likelihood that I will be in the hospital. Reviewed guidelines for IOL at 37-38weeks for gHTN. Patient  understands she might go into labor prior anyways. Reviewed also warning s/sx of preeclampsia. She opted to have IOL at 37w 3 days on 10/11. Repeat visit on 10/9 in office.   2. Gestational diabetes mellitus (GDM) affecting pregnancy, antepartum Per report BG well controlled Last EFW was 9/15-- fetus was 97th% (2889g)  3. Fetal multicystic dysplastic kidney affecting care of mother, antepartum, single or unspecified fetus MFM monitoring  4. Supervision of high risk pregnancy, antepartum LGA this pregnancy-- proven to 6#4oz   5. Prediabetes   6. Maternal morbid obesity, antepartum (HCC) TWG-24 lb (10.9 kg)  which is above goal but not excessive   Preterm labor symptoms and general obstetric precautions including but not limited to vaginal bleeding, contractions, leaking of fluid and fetal movement were reviewed in detail with the patient. Please refer to After Visit Summary for other counseling recommendations.   Return in about 1 week (around 07/02/2022) for Routine prenatal care (OK TO overbook with NeErnestina Patches0/9).  Future Appointments  Date Time Provider [place]10/05/2022  2:10 PM NeCaren MacadamMD [facility]  07/04/2022 12:00 AM MC-LD SCHED ROOM [facility]-INDC None    KiCaren MacadamMD

## 2022-06-25 NOTE — Progress Notes (Signed)
ROB [redacted]w[redacted]d GBS today  Pt would like cervix check.   Pt wants to discuss induction rather have Dr.Newton.  Pt declines student being in exam rm today.

## 2022-06-25 NOTE — Patient Instructions (Signed)
These supplements and herbs are available over the counter without a prescription. They are often in the vitamin section of a pharmacy.   For pregnancy Blood Builder Magnesium - get Calm drink or gummies __________________________________________________________________________________________  To help ripen your Cervix/prepare the uterus (to get your cervix ready for labor):   Red Raspberry Leaf capsules:  two '300mg'$  or '400mg'$  tablets with each meal, 2-3 times a day  Potential Side Effects Of Raspberry Leaf:  Most women do not experience any side effects from drinking raspberry leaf tea. However, nausea and loose stools are possible. This can cause uterine irritability. If you notice having many braxton hicks contractions, stop this supplement.    Evening Primrose Oil capsules: may take 1 to 3 capsules daily. May also prick one to release the oil and insert it into your vagina at night.  One regimen would be to take 1 tablets three times per day and place 2 tablets in the vagina at night.   Some of the potential side effects:  Upset stomach  Loose stools or diarrhea  Headaches  Nausea   _____________________________________________________________________________________________  For Labor: All of these can be use in the last trimester of pregnancy  5-6 Dates a day -- This can help shorten your labor (may taste better if warmed in microwave until soft). This can also be combined with almond butter, any other nut butter, or wrap in bacon and bake, or toss in smoothies. Can also eat [place] bars. Found where raisins are in the grocery store   ______________________________________________________________________________________________  For Breastfeeding:   NOTHING WILL WORK UNLESS YOU ARE  - Emptying the breast adequately/completely - Emptying the breast regularly  Supplement/Herb Purpose Dose Side Effects  Vitamin D Increase Vitamin D to infant, recommended for all breastfeeding  moms and can use instead of supplementing infant 4000 IU daily None  Fenugreek** use with extreme caution as this can decrease milk supply in some especially those with thyroid disorders  Increases prolactin  '400mg'$  TID (max) Nausea, Loose stools and smelling like maple syrup  Goat's Rue Help increase differentiation of breast tissue, good for women with suspected IGT. Helps with insulin sensitvity 1 capsule BID Nausea, loose stools  Legendairy Milk Supplements -PumpPrincess -Liquid Gold -Cash Cow -Milkapalooza  Combination of herbal galactogues  Per packaging Per packaging  Lactation cookies/bars Food based galactogues/increase maternal calories All the time, q2-3 hours    Flax seeds Food based Galactogue Daily GI upset, nausea  Brewer's yeast Food based Galactogue Daily GI upset, nausea  Hemp Hearts Food based Galactogue Daily GI upset, nausea  Oats Food based Galactogue Daily   Lecithin (Soy or Sunflower) Decreases the viscosity of milk, galactogue, fat emulsifier  Good for oversupply mothers to prevent clogged ducts '1200mg'$  three times per day GI upset  Rehydration drinks (Gatorade/Powerade) Improves maternal hydration and mammary glands are histologically similar to sweat glands  None, caution use in patients with T2DM

## 2022-06-26 ENCOUNTER — Encounter: Payer: Self-pay | Admitting: Family Medicine

## 2022-06-27 ENCOUNTER — Ambulatory Visit: Payer: 59 | Admitting: *Deleted

## 2022-06-27 ENCOUNTER — Ambulatory Visit: Payer: 59 | Attending: Maternal & Fetal Medicine

## 2022-06-27 ENCOUNTER — Telehealth (HOSPITAL_COMMUNITY): Payer: Self-pay | Admitting: *Deleted

## 2022-06-27 ENCOUNTER — Encounter: Payer: 59 | Admitting: Family Medicine

## 2022-06-27 VITALS — BP 120/77 | HR 106

## 2022-06-27 DIAGNOSIS — O0990 Supervision of high risk pregnancy, unspecified, unspecified trimester: Secondary | ICD-10-CM | POA: Insufficient documentation

## 2022-06-27 DIAGNOSIS — O139 Gestational [pregnancy-induced] hypertension without significant proteinuria, unspecified trimester: Secondary | ICD-10-CM | POA: Diagnosis not present

## 2022-06-27 DIAGNOSIS — R638 Other symptoms and signs concerning food and fluid intake: Secondary | ICD-10-CM | POA: Insufficient documentation

## 2022-06-27 DIAGNOSIS — O24415 Gestational diabetes mellitus in pregnancy, controlled by oral hypoglycemic drugs: Secondary | ICD-10-CM

## 2022-06-27 DIAGNOSIS — O35EXX0 Maternal care for other (suspected) fetal abnormality and damage, fetal genitourinary anomalies, not applicable or unspecified: Secondary | ICD-10-CM | POA: Diagnosis not present

## 2022-06-27 DIAGNOSIS — O24419 Gestational diabetes mellitus in pregnancy, unspecified control: Secondary | ICD-10-CM | POA: Insufficient documentation

## 2022-06-27 DIAGNOSIS — E669 Obesity, unspecified: Secondary | ICD-10-CM | POA: Diagnosis not present

## 2022-06-27 DIAGNOSIS — Q614 Renal dysplasia: Secondary | ICD-10-CM | POA: Diagnosis not present

## 2022-06-27 DIAGNOSIS — J45909 Unspecified asthma, uncomplicated: Secondary | ICD-10-CM

## 2022-06-27 DIAGNOSIS — O133 Gestational [pregnancy-induced] hypertension without significant proteinuria, third trimester: Secondary | ICD-10-CM | POA: Diagnosis not present

## 2022-06-27 DIAGNOSIS — O99513 Diseases of the respiratory system complicating pregnancy, third trimester: Secondary | ICD-10-CM

## 2022-06-27 DIAGNOSIS — Z6841 Body Mass Index (BMI) 40.0 and over, adult: Secondary | ICD-10-CM | POA: Insufficient documentation

## 2022-06-27 DIAGNOSIS — O09523 Supervision of elderly multigravida, third trimester: Secondary | ICD-10-CM | POA: Diagnosis not present

## 2022-06-27 DIAGNOSIS — Z3A36 36 weeks gestation of pregnancy: Secondary | ICD-10-CM | POA: Diagnosis not present

## 2022-06-27 DIAGNOSIS — O99213 Obesity complicating pregnancy, third trimester: Secondary | ICD-10-CM | POA: Insufficient documentation

## 2022-06-27 LAB — CERVICOVAGINAL ANCILLARY ONLY
Chlamydia: NEGATIVE
Comment: NEGATIVE
Comment: NORMAL
Neisseria Gonorrhea: NEGATIVE

## 2022-06-27 LAB — STREP GP B NAA: Strep Gp B NAA: NEGATIVE

## 2022-06-27 NOTE — Telephone Encounter (Signed)
Resolve episode Preadmission screen

## 2022-06-28 NOTE — Telephone Encounter (Signed)
Preadmission screen  

## 2022-06-29 ENCOUNTER — Telehealth (HOSPITAL_COMMUNITY): Payer: Self-pay | Admitting: *Deleted

## 2022-06-29 NOTE — Telephone Encounter (Signed)
Preadmission screen  

## 2022-07-01 ENCOUNTER — Inpatient Hospital Stay (HOSPITAL_COMMUNITY): Admission: AD | Admit: 2022-07-01 | Payer: 59 | Source: Home / Self Care | Admitting: Family Medicine

## 2022-07-01 ENCOUNTER — Inpatient Hospital Stay (HOSPITAL_COMMUNITY): Payer: 59

## 2022-07-02 ENCOUNTER — Encounter: Payer: Self-pay | Admitting: Obstetrics and Gynecology

## 2022-07-02 ENCOUNTER — Encounter: Payer: 59 | Admitting: Family Medicine

## 2022-07-02 ENCOUNTER — Ambulatory Visit (INDEPENDENT_AMBULATORY_CARE_PROVIDER_SITE_OTHER): Payer: 59 | Admitting: Family Medicine

## 2022-07-02 ENCOUNTER — Telehealth (HOSPITAL_COMMUNITY): Payer: Self-pay | Admitting: *Deleted

## 2022-07-02 VITALS — BP 135/87 | HR 87 | Wt 326.0 lb

## 2022-07-02 DIAGNOSIS — O0993 Supervision of high risk pregnancy, unspecified, third trimester: Secondary | ICD-10-CM

## 2022-07-02 DIAGNOSIS — O99213 Obesity complicating pregnancy, third trimester: Secondary | ICD-10-CM

## 2022-07-02 DIAGNOSIS — Z3A37 37 weeks gestation of pregnancy: Secondary | ICD-10-CM

## 2022-07-02 DIAGNOSIS — O24419 Gestational diabetes mellitus in pregnancy, unspecified control: Secondary | ICD-10-CM

## 2022-07-02 DIAGNOSIS — O0990 Supervision of high risk pregnancy, unspecified, unspecified trimester: Secondary | ICD-10-CM

## 2022-07-02 DIAGNOSIS — O133 Gestational [pregnancy-induced] hypertension without significant proteinuria, third trimester: Secondary | ICD-10-CM

## 2022-07-02 NOTE — Telephone Encounter (Signed)
Preadmission screen  

## 2022-07-02 NOTE — Progress Notes (Signed)
   PRENATAL VISIT NOTE  Subjective:  Jane Jackson is a 36 y.o. G3P1011 at 48w1dbeing seen today for ongoing prenatal care.  She is currently monitored for the following issues for this high-risk pregnancy and has Maternal morbid obesity, antepartum ([place]; Uncomplicated asthma; Supervision of high risk pregnancy, antepartum; Prediabetes; Fetal multicystic dysplastic kidney affect care of mother, antepartum; Gestational diabetes mellitus (GDM) affecting pregnancy, antepartum; and Gestational hypertension on their problem list.  Patient reports no complaints.  Contractions: Irregular. Vag. Bleeding: None.  Movement: Present. Denies leaking of fluid.   The following portions of the patient's history were reviewed and updated as appropriate: allergies, current medications, past family history, past medical history, past social history, past surgical history and problem list.   Objective:   Vitals:   07/02/22 1438  BP: 135/87  Pulse: 87  Weight: (!) 326 lb (147.9 kg)    Fetal Status: Fetal Heart Rate (bpm): 140   Movement: Present     General:  Alert, oriented and cooperative. Patient is in no acute distress.  Skin: Skin is warm and dry. No rash noted.   Cardiovascular: Normal heart rate noted  Respiratory: Normal respiratory effort, no problems with respiration noted  Abdomen: Soft, gravid, appropriate for gestational age.  Pain/Pressure: Present     Pelvic: Cervical exam deferred        Extremities: Normal range of motion.  Edema: None  Mental Status: Normal mood and affect. Normal behavior. Normal judgment and thought content.   Assessment and Plan:  Pregnancy: G3P1011 at 368w1d. Gestational hypertension, third trimester BP is WNL today Has IOL Scheduled 10/11 at [place]-- plan for cytotec, FB and AROM Recommended [place]. Gestational diabetes mellitus (GDM) affecting pregnancy, antepartum BG stable  3. Supervision of high risk pregnancy, antepartum Discussed membrane  sweeping today.  Reviewed risk of cramping, contractions, bleeding and ROM. Answered patient questions and she agreed to proceed with procedure.   4. Maternal morbid obesity, antepartum (HCC) TWG=26 lb (11.8 kg)   Preterm labor symptoms and general obstetric precautions including but not limited to vaginal bleeding, contractions, leaking of fluid and fetal movement were reviewed in detail with the patient. Please refer to After Visit Summary for other counseling recommendations.   No follow-ups on file.  Future Appointments  Date Time Provider [place]10/07/2022 12:00 AM MC-LD SCHED ROOM MC-INDC None    KiCaren MacadamMD

## 2022-07-02 NOTE — Progress Notes (Signed)
ROB [redacted]w[redacted]d Pt would like cervix check today  has blood sugar log with her.   Pt does not want student in exam room.

## 2022-07-04 ENCOUNTER — Inpatient Hospital Stay (HOSPITAL_COMMUNITY)
Admission: AD | Admit: 2022-07-04 | Discharge: 2022-07-08 | DRG: 807 | Disposition: A | Discharge status: Home / Self Care | Payer: 59 | Attending: Obstetrics & Gynecology | Admitting: Obstetrics & Gynecology

## 2022-07-04 ENCOUNTER — Encounter: Payer: 59 | Admitting: Family Medicine

## 2022-07-04 ENCOUNTER — Inpatient Hospital Stay (HOSPITAL_COMMUNITY): Payer: 59

## 2022-07-04 ENCOUNTER — Other Ambulatory Visit: Payer: Self-pay

## 2022-07-04 DIAGNOSIS — Q613 Polycystic kidney, unspecified: Secondary | ICD-10-CM | POA: Diagnosis not present

## 2022-07-04 DIAGNOSIS — O134 Gestational [pregnancy-induced] hypertension without significant proteinuria, complicating childbirth: Principal | ICD-10-CM | POA: Diagnosis present

## 2022-07-04 DIAGNOSIS — J45909 Unspecified asthma, uncomplicated: Secondary | ICD-10-CM | POA: Diagnosis present

## 2022-07-04 DIAGNOSIS — O358XX0 Maternal care for other (suspected) fetal abnormality and damage, not applicable or unspecified: Secondary | ICD-10-CM | POA: Diagnosis present

## 2022-07-04 DIAGNOSIS — Z87891 Personal history of nicotine dependence: Secondary | ICD-10-CM | POA: Diagnosis not present

## 2022-07-04 DIAGNOSIS — Z91040 Latex allergy status: Secondary | ICD-10-CM | POA: Diagnosis not present

## 2022-07-04 DIAGNOSIS — O139 Gestational [pregnancy-induced] hypertension without significant proteinuria, unspecified trimester: Secondary | ICD-10-CM | POA: Diagnosis present

## 2022-07-04 DIAGNOSIS — Z3A37 37 weeks gestation of pregnancy: Secondary | ICD-10-CM

## 2022-07-04 DIAGNOSIS — O9902 Anemia complicating childbirth: Secondary | ICD-10-CM | POA: Diagnosis not present

## 2022-07-04 DIAGNOSIS — O0990 Supervision of high risk pregnancy, unspecified, unspecified trimester: Secondary | ICD-10-CM

## 2022-07-04 DIAGNOSIS — O9952 Diseases of the respiratory system complicating childbirth: Secondary | ICD-10-CM | POA: Diagnosis present

## 2022-07-04 DIAGNOSIS — O99214 Obesity complicating childbirth: Secondary | ICD-10-CM | POA: Diagnosis present

## 2022-07-04 DIAGNOSIS — Z7982 Long term (current) use of aspirin: Secondary | ICD-10-CM | POA: Diagnosis not present

## 2022-07-04 DIAGNOSIS — O09299 Supervision of pregnancy with other poor reproductive or obstetric history, unspecified trimester: Secondary | ICD-10-CM | POA: Diagnosis present

## 2022-07-04 DIAGNOSIS — D649 Anemia, unspecified: Secondary | ICD-10-CM | POA: Diagnosis not present

## 2022-07-04 DIAGNOSIS — O24419 Gestational diabetes mellitus in pregnancy, unspecified control: Secondary | ICD-10-CM | POA: Diagnosis present

## 2022-07-04 DIAGNOSIS — O24425 Gestational diabetes mellitus in childbirth, controlled by oral hypoglycemic drugs: Secondary | ICD-10-CM | POA: Diagnosis not present

## 2022-07-04 DIAGNOSIS — O133 Gestational [pregnancy-induced] hypertension without significant proteinuria, third trimester: Secondary | ICD-10-CM

## 2022-07-04 LAB — COMPREHENSIVE METABOLIC PANEL
ALT: 10 U/L (ref 0–44)
AST: 20 U/L (ref 15–41)
Albumin: 2.6 g/dL — ABNORMAL LOW (ref 3.5–5.0)
Alkaline Phosphatase: 86 U/L (ref 38–126)
Anion gap: 13 (ref 5–15)
BUN: 9 mg/dL (ref 6–20)
CO2: 20 mmol/L — ABNORMAL LOW (ref 22–32)
Calcium: 9.2 mg/dL (ref 8.9–10.3)
Chloride: 104 mmol/L (ref 98–111)
Creatinine, Ser: 0.65 mg/dL (ref 0.44–1.00)
GFR, Estimated: >60 mL/min (ref >60)
Glucose, Bld: 137 mg/dL — ABNORMAL HIGH (ref 70–99)
Potassium: 4 mmol/L (ref 3.5–5.1)
Sodium: 137 mmol/L (ref 135–145)
Total Bilirubin: 0.3 mg/dL (ref 0.3–1.2)
Total Protein: 6 g/dL — ABNORMAL LOW (ref 6.5–8.1)

## 2022-07-04 LAB — CBC
HCT: 35.3 % — ABNORMAL LOW (ref 36.0–46.0)
Hemoglobin: 12.2 g/dL (ref 12.0–15.0)
MCH: 30.3 pg (ref 26.0–34.0)
MCHC: 34.6 g/dL (ref 30.0–36.0)
MCV: 87.8 fL (ref 80.0–100.0)
Platelets: 269 10*3/uL (ref 150–400)
RBC: 4.02 MIL/uL (ref 3.87–5.11)
RDW: 14 % (ref 11.5–15.5)
WBC: 11.1 10*3/uL — ABNORMAL HIGH (ref 4.0–10.5)
nRBC: 0 % (ref 0.0–0.2)

## 2022-07-04 LAB — URIC ACID: Uric Acid, Serum: 5.1 mg/dL (ref 2.5–7.1)

## 2022-07-04 LAB — PROTEIN / CREATININE RATIO, URINE
Creatinine, Urine: 189 mg/dL
Protein Creatinine Ratio: 0.06 mg/mg{Cre} (ref 0.00–0.15)
Total Protein, Urine: 11 mg/dL

## 2022-07-04 LAB — GLUCOSE, CAPILLARY
Glucose-Capillary: 172 mg/dL — ABNORMAL HIGH (ref 70–99)
Glucose-Capillary: 132 mg/dL — ABNORMAL HIGH (ref 70–99)
Glucose-Capillary: 125 mg/dL — ABNORMAL HIGH (ref 70–99)

## 2022-07-04 LAB — TYPE AND SCREEN
ABO/RH(D): A POS
Antibody Screen: NEGATIVE

## 2022-07-04 LAB — RPR: RPR Ser Ql: NONREACTIVE

## 2022-07-04 MED ORDER — SOD CITRATE-CITRIC ACID 500-334 MG/5ML PO SOLN
30.0000 mL | ORAL | Status: DC | PRN
  Administered 2022-07-04: 30 mL via ORAL
  Filled 2022-07-04 (×2): qty 30

## 2022-07-04 MED ORDER — NALBUPHINE HCL 10 MG/ML IJ SOLN: 10.0000 mg | Freq: Once | INTRAMUSCULAR | Status: DC

## 2022-07-04 MED ORDER — TERBUTALINE SULFATE 1 MG/ML IJ SOLN: 0.2500 mg | Freq: Once | INTRAMUSCULAR | Status: DC | PRN

## 2022-07-04 MED ORDER — LACTATED RINGERS IV SOLN: INTRAVENOUS | Status: DC

## 2022-07-04 MED ORDER — ONDANSETRON HCL 4 MG/2ML IJ SOLN: 4.0000 mg | Freq: Four times a day (QID) | INTRAMUSCULAR | Status: DC | PRN

## 2022-07-04 MED ORDER — NALBUPHINE HCL 10 MG/ML IJ SOLN
10.0000 mg | Freq: Once | INTRAMUSCULAR | Status: AC
  Administered 2022-07-04: 10 mg via INTRAVENOUS
  Filled 2022-07-04: qty 1

## 2022-07-04 MED ORDER — DEXTROSE IN LACTATED RINGERS 5 % IV SOLN: INTRAVENOUS | Status: DC

## 2022-07-04 MED ORDER — OXYCODONE-ACETAMINOPHEN 5-325 MG PO TABS: 1.0000 | ORAL_TABLET | ORAL | Status: DC | PRN

## 2022-07-04 MED ORDER — FENTANYL CITRATE (PF) 100 MCG/2ML IJ SOLN: 50.0000 ug | INTRAMUSCULAR | Status: DC | PRN

## 2022-07-04 MED ORDER — OXYTOCIN-SODIUM CHLORIDE 30-0.9 UT/500ML-% IV SOLN: 1.0000 m[IU]/min | INTRAVENOUS | Status: DC

## 2022-07-04 MED ORDER — LACTATED RINGERS IV SOLN: 500.0000 mL | INTRAVENOUS | Status: DC | PRN

## 2022-07-04 MED ORDER — HYDROXYZINE HCL 50 MG PO TABS: 50.0000 mg | ORAL_TABLET | Freq: Four times a day (QID) | ORAL | Status: DC | PRN

## 2022-07-04 MED ORDER — MISOPROSTOL 50MCG HALF TABLET
50.0000 ug | ORAL_TABLET | ORAL | Status: DC
  Administered 2022-07-04 (×2): 50 ug via BUCCAL
  Filled 2022-07-04 (×2): qty 1

## 2022-07-04 MED ORDER — MISOPROSTOL 25 MCG QUARTER TABLET
25.0000 ug | ORAL_TABLET | Freq: Once | ORAL | Status: AC
  Administered 2022-07-04: 25 ug via ORAL
  Filled 2022-07-04: qty 1

## 2022-07-04 MED ORDER — OXYCODONE-ACETAMINOPHEN 5-325 MG PO TABS: 2.0000 | ORAL_TABLET | ORAL | Status: DC | PRN

## 2022-07-04 MED ORDER — FLEET ENEMA 7-19 GM/118ML RE ENEM: 1.0000 | ENEMA | Freq: Every day | RECTAL | Status: DC | PRN

## 2022-07-04 MED ORDER — OXYTOCIN BOLUS FROM INFUSION
333.0000 mL | Freq: Once | INTRAVENOUS | Status: AC
  Administered 2022-07-06: 333 mL via INTRAVENOUS

## 2022-07-04 MED ORDER — ACETAMINOPHEN 325 MG PO TABS: 650.0000 mg | ORAL_TABLET | ORAL | Status: DC | PRN

## 2022-07-04 MED ORDER — LIDOCAINE HCL (PF) 1 % IJ SOLN: 30.0000 mL | INTRAMUSCULAR | Status: DC | PRN

## 2022-07-04 MED ORDER — ZOLPIDEM TARTRATE 5 MG PO TABS: 5.0000 mg | ORAL_TABLET | Freq: Every evening | ORAL | Status: DC | PRN

## 2022-07-04 MED ORDER — MISOPROSTOL 25 MCG QUARTER TABLET
25.0000 ug | ORAL_TABLET | Freq: Once | ORAL | Status: DC
  Filled 2022-07-04: qty 1

## 2022-07-04 MED ORDER — DEXTROSE 50 % IV SOLN: 0.0000 mL | INTRAVENOUS | Status: DC | PRN

## 2022-07-04 MED ORDER — INSULIN REGULAR(HUMAN) IN NACL 100-0.9 UT/100ML-% IV SOLN
INTRAVENOUS | Status: DC
  Administered 2022-07-04: 1.6 [IU]/h via INTRAVENOUS
  Filled 2022-07-04 (×2): qty 100

## 2022-07-04 MED ORDER — NALBUPHINE HCL 10 MG/ML IJ SOLN
10.0000 mg | Freq: Once | INTRAMUSCULAR | Status: AC
  Administered 2022-07-04: 10 mg via INTRAMUSCULAR
  Filled 2022-07-04: qty 1

## 2022-07-04 MED ORDER — OXYTOCIN-SODIUM CHLORIDE 30-0.9 UT/500ML-% IV SOLN: 2.5000 [IU]/h | INTRAVENOUS | Status: DC

## 2022-07-04 MED ORDER — METFORMIN HCL 500 MG PO TABS
500.0000 mg | ORAL_TABLET | Freq: Once | ORAL | Status: AC
  Administered 2022-07-04: 500 mg via ORAL
  Filled 2022-07-04: qty 1

## 2022-07-04 NOTE — Inpatient Diabetes Management (Signed)
Inpatient Diabetes Program Recommendations  AACE/ADA: New Consensus Statement on Inpatient Glycemic Control (2015)  Target Ranges:  Prepandial:   less than 140 mg/dL      Peak postprandial:   less than 180 mg/dL (1-2 hours)      Critically ill patients:  140 - 180 mg/dL   Lab Results  Component Value Date   GLUCAP 125 (H) 07/04/2022   HGBA1C 5.8 (H) 01/15/2022    Review of Glycemic Control  Latest Reference Range & Units 07/04/22 05:49 07/04/22 06:54 07/04/22 07:32  Glucose-Capillary 70 - 99 mg/dL 172 (H) 132 (H) 125 (H)  (H): Data is abnormally high Diabetes history: GDM Outpatient Diabetes medications: Metformin 500 mg BID Current orders for Inpatient glycemic control: IV insulin  Inpatient Diabetes Program Recommendations:    Noted initiations of IV insulin per Endotool using Dexcom. In agreement.  Following delivery would consider: Novolog 0-9 units TID & HS under glycemic control order set.  Will follow.   Thanks, Bronson Curb, MSN, CDE, RNC-OB Diabetes Coordinator [phone] (8a-5p)

## 2022-07-04 NOTE — Progress Notes (Signed)
Labor Progress Note Banita Lehn is a 36 y.o. G3P1011 at 35w3dpresented for IOL for GHTN  S: Having painful ctx with cytotec  O:  BP 137/69 (BP Location: Right Arm)   Pulse 71   Temp 98.1 F (36.7 C) (Oral)   Resp 16   Ht '5\' 4"'$  (1.626 m)   Wt (!) 150.2 kg   LMP 10/15/2021   BMI 56.83 kg/m  EFM: 135/moderate/+accels no decels  CVE: Dilation: 2 Effacement (%): Thick Station: -3 Presentation: Vertex Exam by:: Lailee Hoelzel MD  Cervix was still very high. Recommended FB placement with speculum and patient agreed. Premedicated with Nubain.  Speculum placed Foley passed through cervix with use of ringed forceps, inflated to 60 cc Tolerated well  A&P: 36y.o. G3P1011 369w3dOL for gHTN #Labor: Still in ripening phase. Prefers to avoid AROM and Pitocin if possible. FB placed to help with ripening. Continue cytotec.  #Pain: Nubain given, prn additional #FWB: Cat 1  #GBS negative  KiCaren MacadamMD 12:54 PM

## 2022-07-04 NOTE — Progress Notes (Signed)
Labor Progress Note Jane Jackson is a 36 y.o. G3P1011 at 59w3dpresented for IOL for gHTN  S: Doing ok. Eating Bfast currentl.  Emotional and tired  O:  BP 137/69   Pulse 71   Temp 98.1 F (36.7 C) (Oral)   Resp 16   Ht '5\' 4"'$  (1.626 m)   Wt (!) 150.2 kg   LMP 10/15/2021   BMI 56.83 kg/m  EFM: 135/moderate/15x15 accels, no decels  CVE: Dilation: 2 Effacement (%): Thick Station: -3 Presentation: Vertex Exam by:: Juliahna Wiswell MD   A&P: 36y.o. G3P1011 349w3dOL for gHTN #Labor: Progressing. Discussed plan with repeat cytotec and possible FB if unchanged.  #Pain: PRN medications. Coping well currently #FWB: Cat 1 #GBS negative  KiCaren MacadamMD 12:42 PM

## 2022-07-04 NOTE — Progress Notes (Signed)
Dexcom is calibrated with Hospital CBG machine.  Will use CBG's from [facility] for future values for endotool.  Dr Caron Presume updated and order placed.

## 2022-07-04 NOTE — Progress Notes (Addendum)
Labor Progress Note Jane Jackson is a 36 y.o. G3P1011 at 6w3dpresented for IOL  S: FB is out and patient is concerned bout starting pitocin.   O:  BP 133/69   Pulse 63   Temp 98 F (36.7 C) (Oral)   Resp 17   Ht '5\' 4"'$  (1.626 m)   Wt (!) 150.2 kg   LMP 10/15/2021   BMI 56.83 kg/m  EFM: 125 bpm/Moderate variability/ 15x15 accels/ None decels   CVE: Dilation: 2 Effacement (%): Thick Station: -3 Presentation: Vertex Exam by:: Newton MD   A&P: 36y.o. G3P1011 375w3dOL #Labor: Poor pressure against cervix and difficult cervical exam.Plan on 1x1 pitocin and consider AROM at next check.  #Pain: considering epidural vs other options  #FWB: Cat 1 #GBS negative   JoConcepcion LivingMD 11:49 PM

## 2022-07-04 NOTE — Progress Notes (Signed)
Labor Progress Note Jane Jackson is a 36 y.o. G3P1011 at 61w3dpresented for IOL  S: Uncomfortable but controlling pain with breathing through contractions, has husband at bedside. Endorses concern over fatigue and wants to sleep.   O:  BP 131/67 (BP Location: Right Arm)   Pulse 84   Temp 98.1 F (36.7 C) (Oral)   Resp 16   Ht '5\' 4"'$  (1.626 m)   Wt (!) 150.2 kg   LMP 10/15/2021   BMI 56.83 kg/m  EFM: 135bpm/Moderate variability/ 15x15 accels/ None decels   CVE: Dilation: 1.5 Effacement (%): Thick Station: -3 Presentation: Vertex Exam by:: DNadara Mustard RN   A&P: 36y.o. G3P1011 382w3dOL #Labor: Progressing well. Considering epidural again but did not tolerate well with her previous pregnancy. Postponing for now.  #Pain: 4/10 #FWB: Cat 1 #GBS negative   ClDeloria LairDO 10:39 AM

## 2022-07-04 NOTE — H&P (Signed)
OBSTETRIC ADMISSION HISTORY AND PHYSICAL  Jane Jackson is a 36 y.o. female G3P1011 with IUP at 41w3dby 8 wk u[place]ore[place]resenting for IOL for gHTN. She reports +FMs, No LOF, no VB, no blurry vision, headaches or peripheral edema, and RUQ pain.  She plans on breast feeding. She request nothing  for birth control. [facility]   Dating: By 8 wk [place]--->  Estimated Date of Delivery: 07/22/22  Sono:    '@[redacted]w[redacted]d'$ , CWD, normal anatomy, cephalic presentation,  anterior placenta, 2889 g, 97% EFW   Prenatal History/Complications: gHTN, GDMA2 on metformin   Past Medical History: Past Medical History:  Diagnosis Date   Anxiety    Asthma    Chicken pox    Complication of anesthesia    issue with asthma and intubation   Cystic dysplasia of one kidney    about 4 months ago-no f/u- largest 536m   Diverticulitis    Family history of cystic fibrosis    Normal CF screen   Family history of mental retardation    Fibroid, uterine    dx a few months ago   GERD (gastroesophageal reflux disease)    H/O mitral valve prolapse    as a child   History of mitral valve prolapse 09/16/2016   Impaction, bowel (HCPine   Inappropriate sinus tachycardia    a. exacerbated by pregnancy - 2018; b. 01/2017 Holter: 29% of time in sinus tachycardia;  c. 02/2017 Echo: EF 65-70%, no rwma; c. 01/2017 Holter: 29% of time in sinus tachycardia.   Kidney stones    Upper respiratory tract infection 05/27/2020    Past Surgical History: Past Surgical History:  Procedure Laterality Date   APPENDECTOMY     CHOLECYSTECTOMY N/A 01/18/2016   Procedure: LAPAROSCOPIC CHOLECYSTECTOMY WITH INTRAOPERATIVE CHOLANGIOGRAM;  Surgeon: BeExcell SeltzerMD;  Location: [facility];  Service: General;  Laterality: N/A;   ruptured cyst     TONSILLECTOMY AND ADENOIDECTOMY     age [age] TYMPANOSTOMY TUBE PLACEMENT      Obstetrical History: OB History     Gravida  3   Para  1   Term  1   Preterm      AB  1   Living   1      SAB  1   IAB      Ectopic      Multiple  0   Live Births  1           Social History Social History   Socioeconomic History   Marital status: Married    Spouse name: Not on file   Number of children: Not on file   Years of education: Not on file   Highest education level: Not on file  Occupational History   Not on file  Tobacco Use   Smoking status: Former    Types: Cigarettes    Quit date: 03/03/2008    Years since quitting: 14.3   Smokeless tobacco: Never   Tobacco comments:    Smoked occ, no smoking for years, per pt  Vaping Use   Vaping Use: Never used  Substance and Sexual Activity   Alcohol use: Not Currently    Comment: last use 10/28/2021 "one drink"   Drug use: No   Sexual activity: Yes    Partners: Male    Birth control/protection: None    Comment: hx ocp, depo, condom  Other Topics Concern   Not on file  Social History Narrative   Not on file   Social Determinants of Health   Financial Resource Strain: Not on file  Food Insecurity: Not on file  Transportation Needs: Not on file  Physical Activity: Not on file  Stress: Not on file  Social Connections: Not on file    Family History: Family History  Problem Relation Age of Onset   Arthritis Mother    Hypertension Mother    Diabetes Mother    Cervical cancer Mother    Alcohol abuse Father    Arthritis Father    Hypertension Father    Arthritis Maternal Grandmother    Heart disease Maternal Grandmother    Stroke Maternal Grandmother    Hypertension Maternal Grandmother    Diabetes Maternal Grandmother    Arthritis Maternal Grandfather    Colon cancer Maternal Grandfather    Prostate cancer Maternal Grandfather    Heart disease Maternal Grandfather    Stroke Maternal Grandfather    Hypertension Maternal Grandfather    Arthritis Paternal Grandmother    Heart disease Paternal Grandmother    Hypertension Paternal Grandmother    Diabetes Paternal Grandmother    Arthritis  Paternal Grandfather    Heart disease Paternal Grandfather    Hypertension Paternal Grandfather    Thyroid disease Maternal Aunt        thyroid cancer    Allergies: Allergies  Allergen Reactions   Latex Itching and Rash   Fentanyl Hives    Medications Prior to Admission  Medication Sig Dispense Refill Last Dose   albuterol (VENTOLIN HFA) 108 (90 Base) MCG/ACT inhaler Inhale into the lungs.      aspirin 81 MG chewable tablet Chew 1 tablet (81 mg total) by mouth daily. 90 tablet 2    Continuous Blood Gluc Sensor (DEXCOM G6 SENSOR) MISC Place one sensor every 10 days 3 each 12    Continuous Blood Gluc Transmit (DEXCOM G6 TRANSMITTER) MISC 1 Units by Does not apply route every 3 (three) months. 1 each 6    metFORMIN (GLUCOPHAGE) 500 MG tablet Take 1 tablet (500 mg total) by mouth 2 (two) times daily with a meal. 60 tablet 2      Review of Systems   All systems reviewed and negative except as stated in HPI  Last menstrual period 10/15/2021. General appearance: alert, cooperative, and appears stated age Lungs: Normal work of breathing Heart: regular rate  Abdomen: soft, non-tender; bowel sounds normal Extremities: Homans sign is negative, no sign of DVT Presentation: cephalic Fetal monitoringBaseline: 135 bpm, Variability: Good {> 6 bpm), Accelerations: Reactive, and Decelerations: Absent Uterine activity irritability      Prenatal labs: ABO, Rh: A/Positive/-- (04/24 1553) Antibody: Negative (04/24 1553) Rubella: 5.43 (04/24 1553) RPR: Non Reactive (04/24 1553)  HBsAg: Negative (04/24 1553)  HIV: Non Reactive (04/24 1553)  GBS: Negative/-- (10/02 1648)  1 hr Glucola failed  Genetic screening  declined Anatomy US normal   Prenatal Transfer Tool  Maternal Diabetes: Yes:  Diabetes Type:  Insulin/Medication controlled Genetic Screening: Declined Maternal Ultrasounds/Referrals: Normal Fetal Ultrasounds or other Referrals:  None Maternal Substance Abuse:  No Significant  Maternal Medications:  Meds include: Other: metformin  Significant Maternal Lab Results:  Group B Strep negative Number of Prenatal Visits:greater than 3 verified prenatal visits Other Comments:  None  No results found for this or any previous visit (from the past 24 hour(s)).  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 G3P1011 at 82w3dhere forIOL for gHTN.  #Labor:Initiating induction with cytotec. Planned on oral and vaginal but patient requests just oral at this time. Offered foley balloon and patient request that at next check if needed  #Pain: Per patient  #FWB: Cat 1 #ID:  GBS Neg  #MOF: breast  #MOC:None #Circ:  Yes  gHTN: pre e labs collected  GDMA2: continue metformin. Patient has CGM which we can verify and use   JConcepcion Living MD  07/04/2022, 12:28 AM

## 2022-07-04 NOTE — Progress Notes (Signed)
Labor Progress Note Jane Jackson is a 36 y.o. G3P1011 at 34w3dpresented for IOL for gHTN S: doing well overall. Has a FB in place. Feels more discomfort from contractions,   O:  BP (!) 142/79 (BP Location: Right Arm)   Pulse 73   Temp 98 F (36.7 C) (Oral)   Resp 16   Ht '5\' 4"'$  (1.626 m)   Wt (!) 150.2 kg   LMP 10/15/2021   BMI 56.83 kg/m  EFM: 120/moderate /+accels  CVE: Dilation: 2 Effacement (%): Thick Station: -3 Presentation: Vertex Exam by:: Newton MD   A&P: 36y.o. G3P1011 335w3dOL for gHTN #Labor: early labor. FB in place. S/p cytotec X3. No new meds ordered now #Pain: IV pain meds #FWB: cat 1 #GBS negative  Blood pressures ok - not in severe range Diabetes - on endo tool, blood sugars well controlled.  Jane Jackson J Sherrilyn RistMD 5:33 PM

## 2022-07-05 ENCOUNTER — Encounter (HOSPITAL_COMMUNITY): Payer: Self-pay | Admitting: Obstetrics & Gynecology

## 2022-07-05 ENCOUNTER — Inpatient Hospital Stay (HOSPITAL_COMMUNITY): Payer: 59 | Admitting: Anesthesiology

## 2022-07-05 LAB — CBC
HCT: 34.3 % — ABNORMAL LOW (ref 36.0–46.0)
Hemoglobin: 11.4 g/dL — ABNORMAL LOW (ref 12.0–15.0)
MCH: 29.8 pg (ref 26.0–34.0)
MCHC: 33.2 g/dL (ref 30.0–36.0)
MCV: 89.6 fL (ref 80.0–100.0)
Platelets: 221 10*3/uL (ref 150–400)
RBC: 3.83 MIL/uL — ABNORMAL LOW (ref 3.87–5.11)
RDW: 14 % (ref 11.5–15.5)
WBC: 9.7 10*3/uL (ref 4.0–10.5)
nRBC: 0 % (ref 0.0–0.2)

## 2022-07-05 LAB — BASIC METABOLIC PANEL
Anion gap: 12 (ref 5–15)
BUN: 7 mg/dL (ref 6–20)
CO2: 22 mmol/L (ref 22–32)
Calcium: 8.9 mg/dL (ref 8.9–10.3)
Chloride: 104 mmol/L (ref 98–111)
Creatinine, Ser: 0.56 mg/dL (ref 0.44–1.00)
GFR, Estimated: >60 mL/min (ref >60)
Glucose, Bld: 107 mg/dL — ABNORMAL HIGH (ref 70–99)
Potassium: 4 mmol/L (ref 3.5–5.1)
Sodium: 138 mmol/L (ref 135–145)

## 2022-07-05 LAB — CBC WITH DIFFERENTIAL/PLATELET
Abs Immature Granulocytes: 0.07 10*3/uL (ref 0.00–0.07)
Basophils Absolute: 0 10*3/uL (ref 0.0–0.1)
Basophils Relative: 0 %
Eosinophils Absolute: 0.1 10*3/uL (ref 0.0–0.5)
Eosinophils Relative: 1 %
HCT: 33.2 % — ABNORMAL LOW (ref 36.0–46.0)
Hemoglobin: 11.5 g/dL — ABNORMAL LOW (ref 12.0–15.0)
Immature Granulocytes: 1 %
Lymphocytes Relative: 17 %
Lymphs Abs: 1.9 10*3/uL (ref 0.7–4.0)
MCH: 30.5 pg (ref 26.0–34.0)
MCHC: 34.6 g/dL (ref 30.0–36.0)
MCV: 88.1 fL (ref 80.0–100.0)
Monocytes Absolute: 0.9 10*3/uL (ref 0.1–1.0)
Monocytes Relative: 8 %
Neutro Abs: 8.4 10*3/uL — ABNORMAL HIGH (ref 1.7–7.7)
Neutrophils Relative %: 73 %
Platelets: 219 10*3/uL (ref 150–400)
RBC: 3.77 MIL/uL — ABNORMAL LOW (ref 3.87–5.11)
RDW: 14 % (ref 11.5–15.5)
WBC: 11.5 10*3/uL — ABNORMAL HIGH (ref 4.0–10.5)
nRBC: 0 % (ref 0.0–0.2)

## 2022-07-05 MED ORDER — FENTANYL-BUPIVACAINE-NACL 0.5-0.125-0.9 MG/250ML-% EP SOLN
EPIDURAL | Status: DC | PRN
  Administered 2022-07-05: 12 mL/h via EPIDURAL

## 2022-07-05 MED ORDER — LACTATED RINGERS IV SOLN: 500.0000 mL | Freq: Once | INTRAVENOUS | Status: DC

## 2022-07-05 MED ORDER — DIPHENHYDRAMINE HCL 50 MG/ML IJ SOLN: 12.5000 mg | INTRAMUSCULAR | Status: DC | PRN

## 2022-07-05 MED ORDER — FENTANYL CITRATE (PF) 100 MCG/2ML IJ SOLN
50.0000 ug | INTRAMUSCULAR | Status: DC | PRN
  Administered 2022-07-05 (×2): 50 ug via INTRAVENOUS
  Filled 2022-07-05 (×2): qty 2

## 2022-07-05 MED ORDER — OXYTOCIN-SODIUM CHLORIDE 30-0.9 UT/500ML-% IV SOLN
1.0000 m[IU]/min | INTRAVENOUS | Status: DC
  Administered 2022-07-05: 1 m[IU]/min via INTRAVENOUS
  Filled 2022-07-05: qty 500

## 2022-07-05 MED ORDER — FENTANYL-BUPIVACAINE-NACL 0.5-0.125-0.9 MG/250ML-% EP SOLN
12.0000 mL/h | EPIDURAL | Status: DC | PRN
  Filled 2022-07-05: qty 250

## 2022-07-05 MED ORDER — TERBUTALINE SULFATE 1 MG/ML IJ SOLN: 0.2500 mg | Freq: Once | INTRAMUSCULAR | Status: DC | PRN

## 2022-07-05 MED ORDER — EPHEDRINE 5 MG/ML INJ: 10.0000 mg | INTRAVENOUS | Status: DC | PRN

## 2022-07-05 MED ORDER — PHENYLEPHRINE 80 MCG/ML (10ML) SYRINGE FOR IV PUSH (FOR BLOOD PRESSURE SUPPORT): 80.0000 ug | PREFILLED_SYRINGE | INTRAVENOUS | Status: DC | PRN

## 2022-07-05 MED ORDER — FENTANYL CITRATE (PF) 100 MCG/2ML IJ SOLN
50.0000 ug | INTRAMUSCULAR | Status: DC | PRN
  Administered 2022-07-05 (×4): 100 ug via INTRAVENOUS
  Filled 2022-07-05 (×4): qty 2

## 2022-07-05 MED ORDER — LIDOCAINE HCL (PF) 1 % IJ SOLN
INTRAMUSCULAR | Status: DC | PRN
  Administered 2022-07-05: 2 mL via EPIDURAL
  Administered 2022-07-05: 10 mL via EPIDURAL

## 2022-07-05 MED ORDER — PHENYLEPHRINE 80 MCG/ML (10ML) SYRINGE FOR IV PUSH (FOR BLOOD PRESSURE SUPPORT)
80.0000 ug | PREFILLED_SYRINGE | INTRAVENOUS | Status: DC | PRN
  Filled 2022-07-05: qty 10

## 2022-07-05 NOTE — Anesthesia Preprocedure Evaluation (Signed)
Anesthesia Evaluation  Patient identified by MRN, date of birth, ID band Patient awake    Reviewed: Allergy & Precautions, Patient's Chart, lab work & pertinent test results  Airway Mallampati: III  TM Distance: >3 FB Neck ROM: Full    Dental no notable dental hx.    Pulmonary asthma , former smoker,  Quit smoking 2009   Pulmonary exam normal breath sounds clear to auscultation       Cardiovascular hypertension, Normal cardiovascular exam Rhythm:Regular Rate:Normal     Neuro/Psych PSYCHIATRIC DISORDERS Anxiety negative neurological ROS     GI/Hepatic Neg liver ROS, GERD  Controlled,  Endo/Other  diabetes, Well Controlled, Gestational, Oral Hypoglycemic AgentsMorbid obesityBMI 30  Renal/GU negative Renal ROS  negative genitourinary   Musculoskeletal negative musculoskeletal ROS (+)   Abdominal (+) + obese,   Peds  Hematology  (+) Blood dyscrasia, anemia , Hb 11.4, plt 221   Anesthesia Other Findings   Reproductive/Obstetrics (+) Pregnancy Issues w/ last epidural at [facility] per pt- SOB, passed out                             Anesthesia Physical Anesthesia Plan  ASA: 3  Anesthesia Plan: Epidural   Post-op Pain Management:    Induction:   PONV Risk Score and Plan: 2  Airway Management Planned: Natural Airway  Additional Equipment: None  Intra-op Plan:   Post-operative Plan:   Informed Consent: I have reviewed the patients History and Physical, chart, labs and discussed the procedure including the risks, benefits and alternatives for the proposed anesthesia with the patient or authorized representative who has indicated his/her understanding and acceptance.       Plan Discussed with:   Anesthesia Plan Comments:         Anesthesia Quick Evaluation

## 2022-07-05 NOTE — Progress Notes (Signed)
OB PN:  S: Pt resting comfortably, no acute complaints  O: BP (!) 140/74   Pulse 81   Temp (!) 97.4 F (36.3 C) (Oral)   Resp (!) 22   Ht '5\' 4"'$  (1.626 m)   Wt (!) 150.2 kg   LMP 10/15/2021   SpO2 99%   BMI 56.83 kg/m   FHT: 125bpm, moderate variablity, + accels, no decels Toco: q3-4 SVE: 4/60/-2, AROM clear fluid  Results for orders placed or performed during the hospital encounter of 07/04/22 (from the past 24 hour(s))  Basic metabolic panel     Status: Abnormal   Collection Time: 07/05/22  5:28 AM  Result Value Ref Range   Sodium 138 135 - 145 mmol/L   Potassium 4.0 3.5 - 5.1 mmol/L   Chloride 104 98 - 111 mmol/L   CO2 22 22 - 32 mmol/L   Glucose, Bld 107 (H) 70 - 99 mg/dL   BUN 7 6 - 20 mg/dL   Creatinine, Ser 0.56 0.44 - 1.00 mg/dL   Calcium 8.9 8.9 - 10.3 mg/dL   GFR, Estimated >60 >60 mL/min   Anion gap 12 5 - 15  CBC with Differential/Platelet     Status: Abnormal   Collection Time: 07/05/22  5:30 AM  Result Value Ref Range   WBC 11.5 (H) 4.0 - 10.5 K/uL   RBC 3.77 (L) 3.87 - 5.11 MIL/uL   Hemoglobin 11.5 (L) 12.0 - 15.0 g/dL   HCT 33.2 (L) 36.0 - 46.0 %   MCV 88.1 80.0 - 100.0 fL   MCH 30.5 26.0 - 34.0 pg   MCHC 34.6 30.0 - 36.0 g/dL   RDW 14.0 11.5 - 15.5 %   Platelets 219 150 - 400 K/uL   nRBC 0.0 0.0 - 0.2 %   Neutrophils Relative % 73 %   Neutro Abs 8.4 (H) 1.7 - 7.7 K/uL   Lymphocytes Relative 17 %   Lymphs Abs 1.9 0.7 - 4.0 K/uL   Monocytes Relative 8 %   Monocytes Absolute 0.9 0.1 - 1.0 K/uL   Eosinophils Relative 1 %   Eosinophils Absolute 0.1 0.0 - 0.5 K/uL   Basophils Relative 0 %   Basophils Absolute 0.0 0.0 - 0.1 K/uL   Immature Granulocytes 1 %   Abs Immature Granulocytes 0.07 0.00 - 0.07 K/uL  CBC     Status: Abnormal   Collection Time: 07/05/22 12:38 PM  Result Value Ref Range   WBC 9.7 4.0 - 10.5 K/uL   RBC 3.83 (L) 3.87 - 5.11 MIL/uL   Hemoglobin 11.4 (L) 12.0 - 15.0 g/dL   HCT 34.3 (L) 36.0 - 46.0 %   MCV 89.6 80.0 - 100.0 fL    MCH 29.8 26.0 - 34.0 pg   MCHC 33.2 30.0 - 36.0 g/dL   RDW 14.0 11.5 - 15.5 %   Platelets 221 150 - 400 K/uL   nRBC 0.0 0.0 - 0.2 %     A/P: 36 y.o. G3P1011 @ 4w4dfor IOL due to GDMA2 1. FWB: Cat. I 2. Labor: continue Pit per protocol Pain: continue epidural GBS: neg GDMA2 -on metformin, accucheck q4hr currently  JJanyth Pupa DO Attending OKamrar FFelsenthalfor WDe[employer] [place]

## 2022-07-05 NOTE — Progress Notes (Signed)
Labor Progress Note Jane Jackson is a 36 y.o. G3P1011 at 72w4dpresented for IOL due to gHTN. Also has A2GDM S: doing well. Feeling contraction pain. Trying to keep an epidural at bay as much as possible.   O:  BP 113/64   Pulse 66   Temp (!) 97.4 F (36.3 C) (Oral)   Resp (!) 22   Ht '5\' 4"'$  (1.626 m)   Wt (!) 150.2 kg   LMP 10/15/2021   BMI 56.83 kg/m  EFM: 120bpm /moderate /+accels, no decels.  Contractions about every 3-5 mins, when on the toco.  CVE: Dilation: 4 Effacement (%): 60, 70 Cervical Position: Posterior Station: -3 Presentation: Vertex Exam by:: JTommye Standard RN   A&P: 36y.o. G3P1011 311w4dOL for gHTN.  #Labor: Progressing well. Has had 3 rounds of cytotec and a FB which has come out. Currently on pitocin, with a slow titration regimen. She is at 8 units.  Discussed AROM, as it can help keep the labor process going, and provide for a more effective augmentation. She declines this for now, would like to wait about 2 hrs and reconsider. #Pain: IV fentanyl as needed for pain, considering epidural #FWB: Category 1 #GBS negative  gHTN: BP well controlled.  A2GDM: currently on the endo tool. BS well controlled.  Mckenlee Mangham J Sherrilyn RistMD 12:39 PM

## 2022-07-05 NOTE — Progress Notes (Signed)
OB PN:  S: Resting comfortably, no issues at this time   O: BP 129/67   Pulse 81   Temp 98.4 F (36.9 C) (Oral)   Resp 18   Ht '5\' 5"'$  (1.651 m)   Wt 92.1 kg   LMP 10/15/2021   SpO2 99%   BMI 33.78 kg/m   FHT: 135 bpm, moderate variablity, + accels, no decels Toco: q2-4 SVE: 5/80/-2  Results for orders placed or performed during the hospital encounter of 07/04/22 (from the past 24 hour(s))  Basic metabolic panel     Status: Abnormal   Collection Time: 07/05/22  5:28 AM  Result Value Ref Range   Sodium 138 135 - 145 mmol/L   Potassium 4.0 3.5 - 5.1 mmol/L   Chloride 104 98 - 111 mmol/L   CO2 22 22 - 32 mmol/L   Glucose, Bld 107 (H) 70 - 99 mg/dL   BUN 7 6 - 20 mg/dL   Creatinine, Ser 0.56 0.44 - 1.00 mg/dL   Calcium 8.9 8.9 - 10.3 mg/dL   GFR, Estimated >60 >60 mL/min   Anion gap 12 5 - 15  CBC with Differential/Platelet     Status: Abnormal   Collection Time: 07/05/22  5:30 AM  Result Value Ref Range   WBC 11.5 (H) 4.0 - 10.5 K/uL   RBC 3.77 (L) 3.87 - 5.11 MIL/uL   Hemoglobin 11.5 (L) 12.0 - 15.0 g/dL   HCT 33.2 (L) 36.0 - 46.0 %   MCV 88.1 80.0 - 100.0 fL   MCH 30.5 26.0 - 34.0 pg   MCHC 34.6 30.0 - 36.0 g/dL   RDW 14.0 11.5 - 15.5 %   Platelets 219 150 - 400 K/uL   nRBC 0.0 0.0 - 0.2 %   Neutrophils Relative % 73 %   Neutro Abs 8.4 (H) 1.7 - 7.7 K/uL   Lymphocytes Relative 17 %   Lymphs Abs 1.9 0.7 - 4.0 K/uL   Monocytes Relative 8 %   Monocytes Absolute 0.9 0.1 - 1.0 K/uL   Eosinophils Relative 1 %   Eosinophils Absolute 0.1 0.0 - 0.5 K/uL   Basophils Relative 0 %   Basophils Absolute 0.0 0.0 - 0.1 K/uL   Immature Granulocytes 1 %   Abs Immature Granulocytes 0.07 0.00 - 0.07 K/uL  CBC     Status: Abnormal   Collection Time: 07/05/22 12:38 PM  Result Value Ref Range   WBC 9.7 4.0 - 10.5 K/uL   RBC 3.83 (L) 3.87 - 5.11 MIL/uL   Hemoglobin 11.4 (L) 12.0 - 15.0 g/dL   HCT 34.3 (L) 36.0 - 46.0 %   MCV 89.6 80.0 - 100.0 fL   MCH 29.8 26.0 - 34.0 pg    MCHC 33.2 30.0 - 36.0 g/dL   RDW 14.0 11.5 - 15.5 %   Platelets 221 150 - 400 K/uL   nRBC 0.0 0.0 - 0.2 %     A/P: 36 y.o. G3P1011 @ 11w4dfor IOL due to GDMA2 1. FWB: Cat. I 2. Labor: Progressing well, IUPC in place, continue  Pain: continue epidural GBS: neg GDMA2 -currently on endotool   VGifford Shave MD

## 2022-07-05 NOTE — Anesthesia Procedure Notes (Signed)
Epidural Patient location during procedure: OB Start time: 07/05/2022 2:45 PM End time: 07/05/2022 2:57 PM  Staffing Anesthesiologist: Pervis Hocking, DO Performed: anesthesiologist   Preanesthetic Checklist Completed: patient identified, IV checked, risks and benefits discussed, monitors and equipment checked, pre-op evaluation and timeout performed  Epidural Patient position: sitting Prep: DuraPrep and site prepped and draped Patient monitoring: continuous pulse ox, blood pressure, heart rate and cardiac monitor Approach: midline Location: L3-L4 Injection technique: LOR air  Needle:  Needle type: Tuohy  Needle gauge: 17 G Needle length: 9 cm Needle insertion depth: 9 cm Catheter type: closed end flexible Catheter size: 19 Gauge Catheter at skin depth: 15 cm Test dose: negative  Assessment Sensory level: T8 Events: blood not aspirated, injection not painful, no injection resistance, no paresthesia and negative IV test  Additional Notes Patient identified. Risks/Benefits/Options discussed with patient including but not limited to bleeding, infection, nerve damage, paralysis, failed block, incomplete pain control, headache, blood pressure changes, nausea, vomiting, reactions to medication both or allergic, itching and postpartum back pain. Confirmed with bedside nurse the patient's most recent platelet count. Confirmed with patient that they are not currently taking any anticoagulation, have any bleeding history or any family history of bleeding disorders. Patient expressed understanding and wished to proceed. All questions were answered. Sterile technique was used throughout the entire procedure. Please see nursing notes for vital signs. Test dose was given through epidural catheter and negative prior to continuing to dose epidural or start infusion. Warning signs of high block given to the patient including shortness of breath, tingling/numbness in hands, complete motor  block, or any concerning symptoms with instructions to call for help. Patient was given instructions on fall risk and not to get out of bed. All questions and concerns addressed with instructions to call with any issues or inadequate analgesia.  Reason for block:procedure for pain

## 2022-07-05 NOTE — Progress Notes (Signed)
Pt refuses pitocin increases. Pt encouraged to get epidural so we can increase pitocin and/or AROM. Pt states she is not ready. lee

## 2022-07-05 NOTE — Progress Notes (Signed)
Labor Progress Note Jane Jackson is a 36 y.o. G3P1011 at 52w3dpresented for IOL  S: Anxious and resting at this time.   O:  BP 126/60   Pulse 75   Temp (!) 97.4 F (36.3 C) (Oral)   Resp 17   Ht '5\' 4"'$  (1.626 m)   Wt (!) 150.2 kg   LMP 10/15/2021   BMI 56.83 kg/m  EFM: 125 bpm/Moderate variability/ 15x15 accels/ None decels   CVE: Dilation: 4 Effacement (%): 60, 70 Cervical Position: Posterior Station: -3 Presentation: Vertex Exam by:: Jane Standard RN   A&P: 36y.o. G3P1011 352w3dOL #Labor: Continuing to progress. Pitocin currently being titrated.   #Pain: considering epidural vs other options  #FWB: Cat 1 #GBS negative   JoConcepcion LivingMD 7:03 AM

## 2022-07-06 ENCOUNTER — Ambulatory Visit: Payer: 59

## 2022-07-06 ENCOUNTER — Encounter (HOSPITAL_COMMUNITY): Payer: Self-pay | Admitting: Obstetrics & Gynecology

## 2022-07-06 DIAGNOSIS — Z3A37 37 weeks gestation of pregnancy: Secondary | ICD-10-CM

## 2022-07-06 DIAGNOSIS — O24425 Gestational diabetes mellitus in childbirth, controlled by oral hypoglycemic drugs: Secondary | ICD-10-CM

## 2022-07-06 DIAGNOSIS — O134 Gestational [pregnancy-induced] hypertension without significant proteinuria, complicating childbirth: Secondary | ICD-10-CM

## 2022-07-06 MED ORDER — IBUPROFEN 600 MG PO TABS
600.0000 mg | ORAL_TABLET | Freq: Four times a day (QID) | ORAL | Status: DC
  Administered 2022-07-06 – 2022-07-08 (×10): 600 mg via ORAL
  Filled 2022-07-06 (×10): qty 1

## 2022-07-06 MED ORDER — BENZOCAINE-MENTHOL 20-0.5 % EX AERO
1.0000 | INHALATION_SPRAY | CUTANEOUS | Status: DC | PRN
  Administered 2022-07-06: 1 via TOPICAL
  Filled 2022-07-06: qty 56

## 2022-07-06 MED ORDER — ONDANSETRON HCL 4 MG PO TABS: 4.0000 mg | ORAL_TABLET | ORAL | Status: DC | PRN

## 2022-07-06 MED ORDER — COCONUT OIL OIL
1.0000 | TOPICAL_OIL | Status: DC | PRN
  Administered 2022-07-07 – 2022-07-08 (×2): 1 via TOPICAL

## 2022-07-06 MED ORDER — ZOLPIDEM TARTRATE 5 MG PO TABS: 5.0000 mg | ORAL_TABLET | Freq: Every evening | ORAL | Status: DC | PRN

## 2022-07-06 MED ORDER — SIMETHICONE 80 MG PO CHEW: 80.0000 mg | CHEWABLE_TABLET | ORAL | Status: DC | PRN

## 2022-07-06 MED ORDER — SENNOSIDES-DOCUSATE SODIUM 8.6-50 MG PO TABS
2.0000 | ORAL_TABLET | ORAL | Status: DC
  Administered 2022-07-06 – 2022-07-08 (×3): 2 via ORAL
  Filled 2022-07-06 (×3): qty 2

## 2022-07-06 MED ORDER — TETANUS-DIPHTH-ACELL PERTUSSIS 5-2.5-18.5 LF-MCG/0.5 IM SUSY: 0.5000 mL | PREFILLED_SYRINGE | Freq: Once | INTRAMUSCULAR | Status: DC

## 2022-07-06 MED ORDER — NIFEDIPINE ER OSMOTIC RELEASE 30 MG PO TB24
30.0000 mg | ORAL_TABLET | Freq: Every day | ORAL | Status: DC
  Administered 2022-07-06 – 2022-07-08 (×3): 30 mg via ORAL
  Filled 2022-07-06 (×3): qty 1

## 2022-07-06 MED ORDER — ONDANSETRON HCL 4 MG/2ML IJ SOLN: 4.0000 mg | INTRAMUSCULAR | Status: DC | PRN

## 2022-07-06 MED ORDER — ACETAMINOPHEN 325 MG PO TABS
650.0000 mg | ORAL_TABLET | ORAL | Status: DC | PRN
  Administered 2022-07-06 – 2022-07-08 (×10): 650 mg via ORAL
  Filled 2022-07-06 (×10): qty 2

## 2022-07-06 MED ORDER — WITCH HAZEL-GLYCERIN EX PADS
1.0000 | MEDICATED_PAD | CUTANEOUS | Status: DC | PRN
  Administered 2022-07-08: 1 via TOPICAL

## 2022-07-06 MED ORDER — PRENATAL MULTIVITAMIN CH
1.0000 | ORAL_TABLET | Freq: Every day | ORAL | Status: DC
  Administered 2022-07-06 – 2022-07-08 (×3): 1 via ORAL
  Filled 2022-07-06 (×3): qty 1

## 2022-07-06 MED ORDER — DIPHENHYDRAMINE HCL 25 MG PO CAPS: 25.0000 mg | ORAL_CAPSULE | Freq: Four times a day (QID) | ORAL | Status: DC | PRN

## 2022-07-06 MED ORDER — OXYCODONE HCL 5 MG PO TABS
5.0000 mg | ORAL_TABLET | ORAL | Status: DC | PRN
  Administered 2022-07-06 – 2022-07-08 (×11): 5 mg via ORAL
  Filled 2022-07-06 (×11): qty 1

## 2022-07-06 MED ORDER — FUROSEMIDE 20 MG PO TABS
20.0000 mg | ORAL_TABLET | Freq: Every day | ORAL | Status: DC
  Administered 2022-07-06 – 2022-07-08 (×3): 20 mg via ORAL
  Filled 2022-07-06 (×3): qty 1

## 2022-07-06 MED ORDER — DIBUCAINE (PERIANAL) 1 % EX OINT: 1.0000 | TOPICAL_OINTMENT | CUTANEOUS | Status: DC | PRN

## 2022-07-06 NOTE — Discharge Summary (Signed)
Postpartum Discharge Summary  Date of Service updated***     Patient Name: Jane Jackson DOB: 14-Jan-1986 MRN: [id number]  Date of admission: 07/04/2022 Delivery date:07/06/2022  Delivering provider: Janyth Pupa  Date of discharge: 07/06/2022  Admitting diagnosis: Gestational hypertension [O13.9] Intrauterine pregnancy: [redacted]w[redacted]d     Secondary diagnosis:  Principal Problem:   Gestational hypertension Active Problems:   Maternal morbid obesity, antepartum ([place])   Supervision of high risk pregnancy, antepartum   Gestational diabetes mellitus (GDM) affecting pregnancy, antepartum   Vacuum-assisted vaginal delivery  Additional problems: ***    Discharge diagnosis: Term Pregnancy Delivered, Gestational Hypertension, and GDM A2                                              Post partum procedures:{Postpartum procedures:23558} Augmentation: AROM, Pitocin, Cytotec, and IP Foley Complications: None  Hospital course: Induction of Labor With Vaginal Delivery   36 y.o. yo G3P1011 at [redacted]w[redacted]d was admitted to the hospital 07/04/2022 for induction of labor.  Indication for induction: Gestational hypertension, A2 DM, and AMA.  Patient had an labor course complicated by vacuum assisted vagina;l delivery due to fetal intolerance of pushing.  Membrane Rupture Time/Date: 5:35 PM ,07/05/2022   Delivery Method:Vaginal, Vacuum (Extractor)  Episiotomy: None  Lacerations:  1st degree;Periurethral;Vaginal  Details of delivery can be found in separate delivery note.  Patient had a postpartum course complicated by***. Patient is discharged home 07/06/22.  Newborn Data: Birth date:07/06/2022  Birth time:1:32 AM  Gender:Female  Living status:Living  Apgars:8 ,9  Weight:   Magnesium Sulfate received: No BMZ received: No Rhophylac:N/A MMR:No T-DaP:{Tdap:23962} Flu: {HEN:27782} Transfusion:{Transfusion received:30440034}  Physical exam  Vitals:   07/06/22 0205 07/06/22 0210 07/06/22 0233 07/06/22  0234  BP: (!) 135/19 136/65 (!) 139/122 (!) 163/91  Pulse: 93 88 (!) 103 98  Resp:  18  18  Temp:  98.1 F (36.7 C)    TempSrc:  Oral    SpO2:      Weight:      Height:       General: {Exam; general:21111117} Lochia: {Desc; appropriate/inappropriate:30686::"appropriate"} Uterine Fundus: {Desc; firm/soft:30687} Incision: {Exam; incision:21111123} DVT Evaluation: {Exam; dvt:2111122} Labs: Lab Results  Component Value Date   WBC 9.7 07/05/2022   HGB 11.4 (L) 07/05/2022   HCT 34.3 (L) 07/05/2022   MCV 89.6 07/05/2022   PLT 221 07/05/2022      Latest Ref Rng & Units 07/05/2022    5:28 AM  CMP  Glucose 70 - 99 mg/dL 107   BUN 6 - 20 mg/dL 7   Creatinine 0.44 - 1.00 mg/dL 0.56   Sodium 135 - 145 mmol/L 138   Potassium 3.5 - 5.1 mmol/L 4.0   Chloride 98 - 111 mmol/L 104   CO2 22 - 32 mmol/L 22   Calcium 8.9 - 10.3 mg/dL 8.9    Edinburgh Score:    05/07/2017    3:44 PM  Edinburgh Postnatal Depression Scale Screening Tool  I have been able to laugh and see the funny side of things. 1  I have looked forward with enjoyment to things. 1  I have blamed myself unnecessarily when things went wrong. 1  I have been anxious or worried for no good reason. 2  I have felt scared or panicky for no good reason. 1  Things have been getting on top of me. 1  I have been so unhappy that I have had difficulty sleeping. 0  I have felt sad or miserable. 1  I have been so unhappy that I have been crying. 1  The thought of harming myself has occurred to me. 0  Edinburgh Postnatal Depression Scale Total 9     After visit meds:  Allergies as of 07/06/2022       Reactions   Latex Itching, Rash     Med Rec must be completed prior to using this [facility]***        Discharge home in stable condition Infant Feeding: {Baby feeding:23562} Infant Disposition:{CHL IP OB HOME WITH BXUXYB:33832} Discharge instruction: per After Visit Summary and Postpartum booklet. Activity: Advance as  tolerated. Pelvic rest for 6 weeks.  Diet: {OB NVBT:66060045} Future Appointments:No future appointments. Follow up Visit:  Message sent 07/06/22  Please schedule this patient for a In person postpartum visit in 4 weeks with the following provider: Any provider. Additional Postpartum F/U:2 hour GTT and BP check 1 week  High risk pregnancy complicated by: GDM and HTN Delivery mode:  Vaginal, Vacuum Neurosurgeon)  Anticipated Birth Control:  Unsure   07/06/2022 Concepcion Living, MD

## 2022-07-06 NOTE — Consult Note (Signed)
Neonatology Note:   Attendance at Delivery:    I was asked by Dr. Nelda Marseille to attend this vaginal delivery at 59w5dfor vacuum assist. The mother is a GG36P1011with good prenatal care.  ROM 7h 513mrior to delivery, fluid clear. Infant vigorous with good spontaneous cry and tone. +60 sec DCC  done.  Appears to be transitioning well on mom.  Per OB, services no longer requested and NICU released. Please contact usKoreaf concerns.    DaMonia SabalhKatherina MiresMD Neonatologist 07/06/2022, 1:38 AM

## 2022-07-06 NOTE — Anesthesia Postprocedure Evaluation (Signed)
Anesthesia Post Note  Patient: Jane Jackson  Procedure(s) Performed: AN AD HOC LABOR EPIDURAL     Patient location during evaluation: Mother Baby Anesthesia Type: Epidural Level of consciousness: awake and alert Pain management: pain level controlled Vital Signs Assessment: post-procedure vital signs reviewed and stable Respiratory status: spontaneous breathing, nonlabored ventilation and respiratory function stable Cardiovascular status: stable Postop Assessment: no headache, no backache, epidural receding, no apparent nausea or vomiting, patient able to bend at knees, adequate PO intake and able to ambulate Anesthetic complications: no   No notable events documented.  Last Vitals:  Vitals:   07/06/22 0830 07/06/22 0930  BP: (!) 144/73 135/68  Pulse: 79 80  Resp: 17   Temp: 36.8 C   SpO2:      Last Pain:  Vitals:   07/06/22 1119  TempSrc:   PainSc: 0-No pain   Pain Goal:                   [employer]

## 2022-07-06 NOTE — Lactation Note (Signed)
This note was copied from a baby's chart. Lactation Consultation Note  Patient Name: Jane Jackson [id number] Date: 07/06/2022 Reason for consult: Initial assessment;Early term 37-38.6wks Age:38 hours  P2: Early term infant at 37+5 weeks Feeding preference: Breast  "Jane Jackson" was swaddled and asleep in support person's arms when I arrived.  Birth parent was happy to report that "Jane Jackson" has latched and fed well since birth.  She is familiar with hand expression; no review desired.  Encouraged to feed on cue or at least 8-12 times/24 hours.  Suggested lots of STS, breast massage and hand expression.  Demonstrated finger feeding.  Offered to return as needed for latch assistance.  Birth parent requested a manual pump for home use.  She did not wish to review pump set up or flange size.  Asked her to check flange size with first pumping and to call me for any sizing concerns.  Birth parent plans to order a pump through insurance.  Presented the [facility] pump as an option, however, she politely declined.  Support person assisting with newborn care.   Maternal Data Has patient been taught Hand Expression?: No Does the patient have breastfeeding experience prior to this delivery?: Yes How long did the patient breastfeed?: 18 months  Feeding Mother's Current Feeding Choice: Breast Milk  LATCH Score                    Lactation Tools Discussed/Used Tools: Pump Breast pump type: Manual Pump Education: Setup, frequency, and cleaning (Offered to set up and assess flange size; parent declined assistance) Reason for Pumping: Birth parent's request Pumping frequency: prn  Interventions Interventions: Skin to skin;Breast feeding basics reviewed;Education;LC Services brochure  Discharge Pump: Personal  Consult Status Consult Status: Follow-up Date: 07/07/22 Follow-up type: In-patient    Colby Reels R Creola Krotz 07/06/2022, 9:10 AM

## 2022-07-07 NOTE — Progress Notes (Signed)
Post Partum Day 1 Subjective: no complaints and up ad lib  Objective: Blood pressure 129/76, pulse 82, temperature 98 F (36.7 C), temperature source Oral, resp. rate 18, height '5\' 5"'$  (1.651 m), weight 92.1 kg, last menstrual period 10/15/2021, SpO2 98 %, unknown if currently breastfeeding. Vitals:   07/06/22 1526 07/06/22 2100 07/06/22 2220 07/07/22 0551  BP: 131/63 138/70 117/72 129/76  Pulse: 86 79 75 82  Resp: (!) 100 '18 18 18  '$ Temp:  98.1 F (36.7 C) 97.6 F (36.4 C) 98 F (36.7 C)  TempSrc:  Oral Oral Oral  SpO2:  100%  98%  Weight:      Height:        Physical Exam:  General: alert, cooperative, and appears stated age 36: appropriate Uterine Fundus: firm DVT Evaluation: No evidence of DVT seen on physical exam.  Recent Labs    07/05/22 0530 07/05/22 1238  HGB 11.5* 11.4*  HCT 33.2* 34.3*    Assessment/Plan: Plan for discharge tomorrow, Breastfeeding, and Circumcision prior to discharge ON lasix and Procardia BP is ok for now   LOS: 3 days   Donnamae Jude, MD 07/07/2022, 7:55 AM

## 2022-07-07 NOTE — Lactation Note (Signed)
This note was copied from a baby's chart. Lactation Consultation Note  Patient Name: Jane Jackson [id number] Date: 07/07/2022 Reason for consult: Follow-up assessment;Early term 37-38.6wks Age:36 years  LC in to room for follow up. Assisted with latch. LC reviewed alignment, optimal breast C-hold and neck/back support. Lactating parent (LP) verbalizes comfort with positioning and latch. Encouraged expressing breast milk, guide from nose to mouth until big gape. Reinforced deep asymetrical latch, chin tug for depth. Colostrum present with hand expression and collected ~7m with manual pump demonstration. 21-mm flanged recommended, may need a 19-mm.  Reviewed normal newborn behavior after 24h, expected output and feeding frequency. LP is experienced.   Plan: 1-Skin to skin, aim for a deep, comfortable latch and breastfeed on demand or 8-12 times in 24h period. 2-Encouraged maternal rest, hydration and food intake.  3-Contact LC as needed for feeds/support/concerns/questions   All questions answered at this time.   Maternal Data Has patient been taught Hand Expression?: Yes Does the patient have breastfeeding experience prior to this delivery?: Yes How long did the patient breastfeed?: 16 months  Feeding Mother's Current Feeding Choice: Breast Milk  LATCH Score Latch: Grasps breast easily, tongue down, lips flanged, rhythmical sucking.  Audible Swallowing: Spontaneous and intermittent  Type of Nipple: Everted at rest and after stimulation (short shafted)  Comfort (Breast/Nipple): Filling, red/small blisters or bruises, mild/mod discomfort  Hold (Positioning): Assistance needed to correctly position infant at breast and maintain latch.  LATCH Score: 8   Lactation Tools Discussed/Used Tools: Pump;Flanges Flange Size: 21 Breast pump type: Manual Pump Education: Setup, frequency, and cleaning;Milk Storage Reason for Pumping: stimulation and supplementation Pumping frequency:  as needed to top up infant Pumped volume: 3 mL (with hand pump demonstration)  Interventions Interventions: Breast feeding basics reviewed;Assisted with latch;Skin to skin;Breast massage;Hand express;Pre-pump if needed;Breast compression;Adjust position;Support pillows;Position options;Expressed milk;Comfort gels;Hand pump;Education  Discharge Discharge Education: Engorgement and breast care Pump: Manual WIC Program: Yes  Consult Status Consult Status: Follow-up Date: 07/08/22 Follow-up type: In-patient    Jane Jackson 07/07/2022, 7:31 PM

## 2022-07-07 NOTE — Progress Notes (Signed)
MOB was referred for history of depression/anxiety. * Referral screened out by Clinical Social Worker because none of the following criteria appear to apply: ~ History of anxiety/depression during this pregnancy, or of post-partum depression following prior delivery. ~ Diagnosis of anxiety and/or depression within last 3 years. Per medical records, MOB was last diagnosed with anxiety in 2017. No mental health concerns noted in MOB's prenatal care records.   OR * MOB's symptoms currently being treated with medication and/or therapy. Please contact the Clinical Social Worker if needs arise, by [facility] request, or if MOB scores greater than 9/yes to question 10 on Edinburgh Postpartum Depression Screen.  Signed,  Berniece Salines, MSW, LCSWA, [place] 07/07/2022 9:26 AM

## 2022-07-08 MED ORDER — OXYCODONE HCL 5 MG PO TABS: 5.0000 mg | ORAL_TABLET | Freq: Four times a day (QID) | ORAL | 0 refills | Status: DC | PRN

## 2022-07-08 MED ORDER — NIFEDIPINE ER 30 MG PO TB24: 30.0000 mg | ORAL_TABLET | Freq: Every day | ORAL | 0 refills | Status: DC

## 2022-07-08 MED ORDER — SENNOSIDES-DOCUSATE SODIUM 8.6-50 MG PO TABS: 2.0000 | ORAL_TABLET | Freq: Every evening | ORAL | 2 refills | Status: DC | PRN

## 2022-07-08 MED ORDER — IBUPROFEN 600 MG PO TABS: 600.0000 mg | ORAL_TABLET | Freq: Four times a day (QID) | ORAL | 2 refills | Status: DC | PRN

## 2022-07-08 MED ORDER — FUROSEMIDE 20 MG PO TABS: 20.0000 mg | ORAL_TABLET | Freq: Every day | ORAL | 0 refills | Status: DC

## 2022-07-08 MED ORDER — FUROSEMIDE 20 MG PO TABS: 20.0000 mg | ORAL_TABLET | Freq: Two times a day (BID) | ORAL | 0 refills | Status: DC

## 2022-07-08 NOTE — Lactation Note (Signed)
This note was copied from a baby's chart. Lactation Consultation Note  Patient Name: Jane Jackson [id number] Date: 07/08/2022 Reason for consult: Follow-up assessment;Early term 37-38.6wks;Infant weight loss;Breastfeeding assistance;Nipple pain/trauma (7 % weight loss.) Age:38 hours LC offered to assess sore nipples, areola edema noted and some small cracking. Has comfort gels. LC provided shells. ( See interventions down below for steps for latching) for tx of sore nipples.  LC recommended the football position until the soreness improves  to assure depth at the breast.  LC reviewed sore nipple tx , and reviewed the doc flow sheets.  LC reviewed BF D/C teaching and [place] resources after D/C.   Maternal Data Has patient been taught Hand Expression?: Yes  Feeding Mother's Current Feeding Choice: Breast Milk  LATCH Score Latch: Grasps breast easily, tongue down, lips flanged, rhythmical sucking.  Audible Swallowing: Spontaneous and intermittent  Type of Nipple: Everted at rest and after stimulation  Comfort (Breast/Nipple): Filling, red/small blisters or bruises, mild/mod discomfort  Hold (Positioning): Assistance needed to correctly position infant at breast and maintain latch.  LATCH Score: 8   Lactation Tools Discussed/Used Tools: Shells;Pump;Flanges Flange Size: 21;24 Breast pump type: Manual Pump Education: Milk Storage Reason for Pumping: see above under hand expressing  Interventions Interventions: Breast feeding basics reviewed;Assisted with latch;Skin to skin;Breast massage;Hand express;Reverse pressure;Breast compression;Adjust position;Support pillows;Position options;Shells;Comfort gels;Hand pump;Education;LC Services brochure  Discharge Discharge Education: Engorgement and breast care;Warning signs for feeding baby Pump: Manual  Consult Status Consult Status: Complete Date: 07/08/22    Myer Haff 07/08/2022, 9:18 AM

## 2022-07-09 ENCOUNTER — Encounter (HOSPITAL_COMMUNITY): Payer: Self-pay | Admitting: Obstetrics & Gynecology

## 2022-07-09 ENCOUNTER — Encounter: Payer: 59 | Admitting: Family Medicine

## 2022-07-10 ENCOUNTER — Encounter: Payer: 59 | Admitting: Obstetrics & Gynecology

## 2022-07-12 ENCOUNTER — Ambulatory Visit: Payer: 59 | Admitting: Advanced Practice Midwife

## 2022-07-12 VITALS — BP 145/78 | HR 106 | Wt 326.0 lb

## 2022-07-12 DIAGNOSIS — O9122 Nonpurulent mastitis associated with the puerperium: Secondary | ICD-10-CM

## 2022-07-12 DIAGNOSIS — O139 Gestational [pregnancy-induced] hypertension without significant proteinuria, unspecified trimester: Secondary | ICD-10-CM

## 2022-07-12 DIAGNOSIS — G44219 Episodic tension-type headache, not intractable: Secondary | ICD-10-CM

## 2022-07-12 DIAGNOSIS — R600 Localized edema: Secondary | ICD-10-CM

## 2022-07-12 MED ORDER — DICLOXACILLIN SODIUM 500 MG PO CAPS: 500.0000 mg | ORAL_CAPSULE | Freq: Four times a day (QID) | ORAL | 0 refills | Status: DC

## 2022-07-12 MED ORDER — NIFEDIPINE ER OSMOTIC RELEASE 30 MG PO TB24: 30.0000 mg | ORAL_TABLET | Freq: Two times a day (BID) | ORAL | 0 refills | Status: DC

## 2022-07-12 MED ORDER — FUROSEMIDE 20 MG PO TABS: 20.0000 mg | ORAL_TABLET | Freq: Two times a day (BID) | ORAL | 0 refills | Status: DC

## 2022-07-12 MED ORDER — EXCEDRIN TENSION HEADACHE 500-65 MG PO TABS: 2.0000 | ORAL_TABLET | Freq: Four times a day (QID) | ORAL | 0 refills | Status: DC | PRN

## 2022-07-12 NOTE — Progress Notes (Signed)
   POSTPARTUM PROBLEM VISIT NOTE   Subjective:     Jane Jackson is a 36 y.o. [id number] who presents to the office six days s/p vaginal birth. Eating a regular diet without difficulty. Patient presents with concern for mastitis, bilateral lower extremity edema and generally feeling unwell.   Mastitis Patient reports difficulty latching her son. She only had a hand pump and uses it prior to episodes of breastfeeding. She reports discoloration on both breasts. She is meeting with a Science writer later today. She denies fever, joint pain  Bilateral Lower Extremity Edema This is a recurrent problem. Patient is taking Lasix BID as prescribed at hospital discharge.  She states she is doing everything including wearing compression leggings and stockings, drinking large amounts of water, walking around during the day and elevating her legs when at rest.   Blood Pressure Patient states she is compliant with Procardia XL 30 mg daily. She typically takes it in the morning.   Headache Patient reports nagging headache which improves with PO medication but does not completely resolve. She believes it is related to her breast pain. She denies visual disturbances, RUQ/epigastric pain, new onset weight gain.  Review of Systems Pertinent items are noted in HPI.    Objective:    BP (!) 145/78   Pulse (!) 106   Wt (!) 326 lb (147.9 kg)   BMI 54.25 kg/m    General:  Alert, oriented and cooperative. Patient is in no acute distress.  Skin: Skin is warm and dry. Bilateral discoloration on upper aspect of breasts  Cardiovascular: Normal heart rate noted  Respiratory: Normal respiratory effort, no problems with respiration noted  Extremities:  Bilateral 3+ non-pitting edema to level of knees  Mental Status: Normal mood and affect. Normal behavior. Normal judgment and thought content.   Assessment:   - Overall healing well - Reviewed typical pathophysiology of mastitis, continue feeding/pumping  until both breasts are soft and empty   Plan:   1. Increase Procardia XL 30 mg from daily to BID 2. Additional 5 day prescription Lasix BID 3. Initiate treatment for Mastitis, continue plan to meet with Lactation this afternoon 4. Initiate headache treatment with Excedrin Tension 5. If headache does not resolve with Excedrin Tension, present to MAU for state PEC eval 6. Non-stat CBC and CMT collected in office 5. RTC in one week for BP check and follow-up to Mastitis treatment  Jane Jackson, [employer], MSN, CNM Certified Nurse Midwife, [facility] for [employer], [place]

## 2022-07-12 NOTE — Progress Notes (Signed)
PP pt here w/ complaints of breast pain possible mastitis and swelling in legs.

## 2022-07-13 LAB — COMPREHENSIVE METABOLIC PANEL
ALT: 37 IU/L — ABNORMAL HIGH (ref 0–32)
AST: 31 IU/L (ref 0–40)
Albumin/Globulin Ratio: 1.5 (ref 1.2–2.2)
Albumin: 3.8 g/dL — ABNORMAL LOW (ref 3.9–4.9)
Alkaline Phosphatase: 104 IU/L (ref 44–121)
BUN/Creatinine Ratio: 14 (ref 9–23)
BUN: 9 mg/dL (ref 6–20)
Bilirubin Total: <0.2 mg/dL (ref 0.0–1.2)
CO2: 23 mmol/L (ref 20–29)
Calcium: 9.1 mg/dL (ref 8.7–10.2)
Chloride: 105 mmol/L (ref 96–106)
Creatinine, Ser: 0.64 mg/dL (ref 0.57–1.00)
Globulin, Total: 2.5 g/dL (ref 1.5–4.5)
Glucose: 132 mg/dL — ABNORMAL HIGH (ref 70–99)
Potassium: 4.3 mmol/L (ref 3.5–5.2)
Sodium: 143 mmol/L (ref 134–144)
Total Protein: 6.3 g/dL (ref 6.0–8.5)
eGFR: 116 mL/min/{1.73_m2} (ref >59)

## 2022-07-13 LAB — CBC
Hematocrit: 37.4 % (ref 34.0–46.6)
Hemoglobin: 12.2 g/dL (ref 11.1–15.9)
MCH: 29.5 pg (ref 26.6–33.0)
MCHC: 32.6 g/dL (ref 31.5–35.7)
MCV: 90 fL (ref 79–97)
Platelets: 313 10*3/uL (ref 150–450)
RBC: 4.14 x10E6/uL (ref 3.77–5.28)
RDW: 14 % (ref 11.7–15.4)
WBC: 8.4 10*3/uL (ref 3.4–10.8)

## 2022-07-16 ENCOUNTER — Encounter: Payer: 59 | Admitting: Family Medicine

## 2022-07-17 ENCOUNTER — Ambulatory Visit: Payer: 59

## 2022-07-18 ENCOUNTER — Other Ambulatory Visit: Payer: Self-pay | Admitting: *Deleted

## 2022-07-18 MED ORDER — BREAST PUMP MISC: 0 refills | Status: DC

## 2022-07-19 ENCOUNTER — Ambulatory Visit: Payer: 59

## 2022-07-19 DIAGNOSIS — R600 Localized edema: Secondary | ICD-10-CM

## 2022-07-19 MED ORDER — FUROSEMIDE 20 MG PO TABS: 20.0000 mg | ORAL_TABLET | Freq: Two times a day (BID) | ORAL | 0 refills | Status: DC

## 2022-07-19 NOTE — Progress Notes (Signed)
Subjective:  Jane Jackson is a 36 y.o. female here for BP check.   Hypertension ROS: Patient denies any headaches, visual symptoms, RUQ/epigastric pain or other concerning symptoms.  Objective:  BP (!) 130/91   Pulse (!) 103   Wt (!) 313 lb (142 kg)   Breastfeeding Yes   BMI 52.09 kg/m   Appearance alert, well appearing, and in no distress. General exam BP noted to needs improvement today in office.    Assessment:   Blood Pressure 130/91  Plan:  Labs per provider and keep PP visit.  Marland Kitchen

## 2022-07-20 LAB — BASIC METABOLIC PANEL
BUN/Creatinine Ratio: 25 — ABNORMAL HIGH (ref 9–23)
BUN: 19 mg/dL (ref 6–20)
CO2: 24 mmol/L (ref 20–29)
Calcium: 9.5 mg/dL (ref 8.7–10.2)
Chloride: 100 mmol/L (ref 96–106)
Creatinine, Ser: 0.76 mg/dL (ref 0.57–1.00)
Glucose: 112 mg/dL — ABNORMAL HIGH (ref 70–99)
Potassium: 4.4 mmol/L (ref 3.5–5.2)
Sodium: 139 mmol/L (ref 134–144)
eGFR: 103 mL/min/{1.73_m2} (ref >59)

## 2022-07-20 LAB — MAGNESIUM: Magnesium: 2 mg/dL (ref 1.6–2.3)

## 2022-07-30 ENCOUNTER — Other Ambulatory Visit: Payer: Self-pay | Admitting: Obstetrics and Gynecology

## 2022-07-30 DIAGNOSIS — R600 Localized edema: Secondary | ICD-10-CM

## 2022-08-09 ENCOUNTER — Ambulatory Visit: Payer: 59 | Admitting: Advanced Practice Midwife

## 2022-08-09 ENCOUNTER — Other Ambulatory Visit: Payer: 59

## 2022-08-12 ENCOUNTER — Ambulatory Visit: Payer: 59

## 2022-08-13 ENCOUNTER — Encounter: Payer: Self-pay | Admitting: Family Medicine

## 2022-08-13 ENCOUNTER — Ambulatory Visit (INDEPENDENT_AMBULATORY_CARE_PROVIDER_SITE_OTHER): Payer: 59 | Admitting: Family Medicine

## 2022-08-13 ENCOUNTER — Other Ambulatory Visit (HOSPITAL_COMMUNITY)
Admission: RE | Admit: 2022-08-13 | Discharge: 2022-08-13 | Disposition: A | Discharge status: Home / Self Care | Payer: 59 | Source: Ambulatory Visit | Attending: Advanced Practice Midwife | Admitting: Advanced Practice Midwife

## 2022-08-13 VITALS — BP 148/84 | HR 94 | Wt 320.0 lb

## 2022-08-13 DIAGNOSIS — N898 Other specified noninflammatory disorders of vagina: Secondary | ICD-10-CM | POA: Diagnosis present

## 2022-08-13 DIAGNOSIS — R102 Pelvic and perineal pain: Secondary | ICD-10-CM

## 2022-08-13 DIAGNOSIS — N719 Inflammatory disease of uterus, unspecified: Secondary | ICD-10-CM

## 2022-08-13 LAB — POCT URINALYSIS DIPSTICK
Bilirubin, UA: NEGATIVE
Glucose, UA: NEGATIVE
Ketones, UA: NEGATIVE
Leukocytes, UA: NEGATIVE
Nitrite, UA: NEGATIVE
Protein, UA: POSITIVE — AB
Spec Grav, UA: >=1.03 — AB (ref 1.010–1.025)
Urobilinogen, UA: 0.2 E.U./dL
pH, UA: 6 (ref 5.0–8.0)

## 2022-08-13 MED ORDER — AMOXICILLIN-POT CLAVULANATE 875-125 MG PO TABS: 1.0000 | ORAL_TABLET | Freq: Two times a day (BID) | ORAL | 1 refills | Status: DC

## 2022-08-13 MED ORDER — IBUPROFEN 600 MG PO TABS: 600.0000 mg | ORAL_TABLET | Freq: Four times a day (QID) | ORAL | 2 refills | Status: DC | PRN

## 2022-08-13 NOTE — Progress Notes (Signed)
[place] Partum Visit Note  Jane Jackson is a 36 y.o. [id number] female who presents for a postpartum visit. She is 5 weeks postpartum following a vacuum-assisted vaginal delivery.  I have fully reviewed the prenatal and intrapartum course. The delivery was at 6 gestational weeks.  Anesthesia: epidural. Postpartum course has been unremarkable. Baby is doing well. Baby is feeding by breast. Bleeding staining only. Bowel function is normal. Bladder function is normal. Patient is not sexually active. Contraception method is none. Postpartum depression screening: negative.   The pregnancy intention screening data noted above was reviewed. Potential methods of contraception were discussed. The patient elected to proceed with No data recorded.   Edinburgh Postnatal Depression Scale - 08/13/22 1525       Edinburgh Postnatal Depression Scale:  In the Past 7 Days   I have been able to laugh and see the funny side of things. 0    I have looked forward with enjoyment to things. 0    I have blamed myself unnecessarily when things went wrong. 1    I have been anxious or worried for no good reason. 2    I have felt scared or panicky for no good reason. 2    Things have been getting on top of me. 0    I have been so unhappy that I have had difficulty sleeping. 0    I have felt sad or miserable. 0    I have been so unhappy that I have been crying. 0    The thought of harming myself has occurred to me. 0    Edinburgh Postnatal Depression Scale Total 5             Health Maintenance Due  Topic Date Due   COVID-19 Vaccine (1) Never done   FOOT EXAM  Never done   OPHTHALMOLOGY EXAM  Never done   INFLUENZA VACCINE  Never done   HEMOGLOBIN A1C  07/17/2022    The following portions of the patient's history were reviewed and updated as appropriate: allergies, current medications, past family history, past medical history, past social history, past surgical history, and problem list.  Review of  Systems Pertinent items are noted in HPI.  Objective:  BP (!) 148/84   Pulse 94   Wt (!) 320 lb (145.2 kg)   Breastfeeding Yes   BMI 53.25 kg/m    General:  alert, cooperative, and appears stated age   Breasts:  not indicated  Lungs: clear to auscultation bilaterally  Heart:  regular rate and rhythm, S1, S2 normal, no murmur, click, rub or gallop  Abdomen: soft, non-tender; bowel sounds normal; no masses,  no organomegaly   Wound NA  GU exam:   Well healed perineum. + bleeding in vaginal vault with odor. Unable to see cervix entirely. Aching with manual but not exquisitely tender       Assessment:   Abnormal Postpartum exam  Plan:   Essential components of care per ACOG recommendations:  1.  Mood and well being: Patient with negative depression screening today.  Is noticing increased irritability and some anxiety. Not overwhelming at this point.  Reviewed local resources for support.  - Patient tobacco use? No.   - hx of drug use? No.    2. Infant care and feeding:  -Patient currently breastmilk feeding? Yes. Reviewed importance of draining breast regularly to support lactation.  -Social determinants of health (SDOH) reviewed in EPIC. No concerns  3. Sexuality, contraception and birth spacing -  Patient does not want a pregnancy in the next year.  Desired family size is 3 children.  - Reviewed reproductive life planning. Reviewed contraceptive methods based on pt preferences and effectiveness.  Patient desired Female Condom today.   - Discussed birth spacing of 18 months  4. Sleep and fatigue -Encouraged family/partner/community support of 4 hrs of uninterrupted sleep to help with mood and fatigue  5. Physical Recovery  - Discussed patients delivery and complications. She describes her labor as good. - Patient had a Vaginal, no problems at delivery. Patient had a 1st degree laceration. Perineal healing reviewed. Patient expressed understanding - Patient has urinary  incontinence? No. - Patient is safe to resume physical and sexual activity-- recommended not returning yet due to pelvic pain see below  6.  Health Maintenance - HM due items addressed Yes - Last pap smear  Diagnosis  Date Value Ref Range Status  01/15/2022   Final   - Negative for intraepithelial lesion or malignancy (NILM)   Pap smear not done at today's visit.  -Breast Cancer screening indicated? No.   7. Chronic Disease/Pregnancy Condition follow up:     1. Vaginal odor - Cervicovaginal ancillary only( [place])  2. Pelvic pain - Urine Culture - POCT Urinalysis Dipstick - amoxicillin-clavulanate (AUGMENTIN) 875-125 MG tablet; Take 1 tablet by mouth 2 (two) times daily.  Dispense: 20 tablet; Refill: 1 - ibuprofen (ADVIL) 600 MG tablet; Take 1 tablet (600 mg total) by mouth every 6 (six) hours as needed for fever, headache, mild pain, moderate pain or cramping.  Dispense: 30 tablet; Refill: 2  3. Endometritis Will treat empirically given pain with odor, no signs of sepsis with normal VS.  Augmentin will also cover urinary infection Ddx includes uterine infection, UTI, possible ovarian cyst. Could but kidney stone but unable to r/o due to prescence of vaginal bleeding. UA with blood but expected finding.  Given reasons to go to ER.  - amoxicillin-clavulanate (AUGMENTIN) 875-125 MG tablet; Take 1 tablet by mouth 2 (two) times daily.  Dispense: 20 tablet; Refill: 1 - Comprehensive metabolic panel - CBC - ibuprofen (ADVIL) 600 MG tablet; Take 1 tablet (600 mg total) by mouth every 6 (six) hours as needed for fever, headache, mild pain, moderate pain or cramping.  Dispense: 30 tablet; Refill: 2  4. Postpartum exam  - PCP follow up  Future Appointments  Date Time Provider [place]  08/24/2022  9:00 AM [facility]  10/12/2022 10:15 AM Caren Macadam, MD [facility]   Caren Macadam, [place] for [facility], [place]

## 2022-08-14 LAB — COMPREHENSIVE METABOLIC PANEL
ALT: 32 IU/L (ref 0–32)
AST: 25 IU/L (ref 0–40)
Albumin/Globulin Ratio: 1.3 (ref 1.2–2.2)
Albumin: 4.1 g/dL (ref 3.9–4.9)
Alkaline Phosphatase: 103 IU/L (ref 44–121)
BUN/Creatinine Ratio: 18 (ref 9–23)
BUN: 13 mg/dL (ref 6–20)
Bilirubin Total: 0.3 mg/dL (ref 0.0–1.2)
CO2: 20 mmol/L (ref 20–29)
Calcium: 10.2 mg/dL (ref 8.7–10.2)
Chloride: 104 mmol/L (ref 96–106)
Creatinine, Ser: 0.71 mg/dL (ref 0.57–1.00)
Globulin, Total: 3.1 g/dL (ref 1.5–4.5)
Glucose: 96 mg/dL (ref 70–99)
Potassium: 4.4 mmol/L (ref 3.5–5.2)
Sodium: 143 mmol/L (ref 134–144)
Total Protein: 7.2 g/dL (ref 6.0–8.5)
eGFR: 112 mL/min/{1.73_m2} (ref >59)

## 2022-08-14 LAB — CBC
Hematocrit: 40 % (ref 34.0–46.6)
Hemoglobin: 13.3 g/dL (ref 11.1–15.9)
MCH: 29 pg (ref 26.6–33.0)
MCHC: 33.3 g/dL (ref 31.5–35.7)
MCV: 87 fL (ref 79–97)
Platelets: 349 10*3/uL (ref 150–450)
RBC: 4.58 x10E6/uL (ref 3.77–5.28)
RDW: 13.9 % (ref 11.7–15.4)
WBC: 12.2 10*3/uL — ABNORMAL HIGH (ref 3.4–10.8)

## 2022-08-15 DIAGNOSIS — R600 Localized edema: Secondary | ICD-10-CM

## 2022-08-15 DIAGNOSIS — Z8759 Personal history of other complications of pregnancy, childbirth and the puerperium: Secondary | ICD-10-CM

## 2022-08-15 DIAGNOSIS — O24419 Gestational diabetes mellitus in pregnancy, unspecified control: Secondary | ICD-10-CM

## 2022-08-15 LAB — CERVICOVAGINAL ANCILLARY ONLY
Bacterial Vaginitis (gardnerella): NEGATIVE
Candida Glabrata: NEGATIVE
Candida Vaginitis: NEGATIVE
Comment: NEGATIVE
Comment: NEGATIVE
Comment: NEGATIVE

## 2022-08-15 LAB — URINE CULTURE

## 2022-08-24 ENCOUNTER — Ambulatory Visit: Payer: 59 | Admitting: Obstetrics and Gynecology

## 2022-08-24 ENCOUNTER — Other Ambulatory Visit: Payer: 59

## 2022-09-03 MED ORDER — FUROSEMIDE 20 MG PO TABS: 20.0000 mg | ORAL_TABLET | Freq: Two times a day (BID) | ORAL | 0 refills | Status: DC

## 2022-09-07 ENCOUNTER — Ambulatory Visit: Payer: 59 | Admitting: Family Medicine

## 2022-09-07 ENCOUNTER — Other Ambulatory Visit: Payer: 59

## 2022-09-20 ENCOUNTER — Telehealth: Payer: Self-pay | Admitting: Cardiovascular Disease

## 2022-09-20 NOTE — Telephone Encounter (Signed)
Left voicemail message to call back  

## 2022-09-20 NOTE — Telephone Encounter (Signed)
Pt c/o swelling: STAT is pt has developed SOB within 24 hours  How much weight have you gained and in what time span?  About 30-40 lbs gained during pregnancy She assumes at least 13 lbs were all fluid   If swelling, where is the swelling located?  Primarily in legs, but also in hands  Are you currently taking a fluid pill?  No, but she would like a prescription for Lasix if at all possible.  Are you currently SOB?  No   Do you have a log of your daily weights (if so, list)?    Have you gained 3 pounds in a day or 5 pounds in a week?   Have you traveled recently?  Patient traveled for the holidays, but this did not interfere with swelling

## 2022-09-21 NOTE — Telephone Encounter (Signed)
I do not think it is a good idea to give her furosemide without appropriate cardiac evaluation.  She needs an office visit.

## 2022-09-21 NOTE — Telephone Encounter (Signed)
Returned the call to the patient. She was last seen in 2021. She stated that her OBGYN gave her small doses of furosemide as needed, temporarily. She stated that she is still having bilateral lower extremity edema and wants to know if Dr. Fletcher Anon would prescribe her enough Furosemide to make it to her appointment with Dr. Harriet Masson on 1/26. She denies shortness of breath.   She has been advised that she should reach out to the OBGYN to see if she can get a refill or she will need to come on for an appointment.   Appointment made 1/2. She will call back and cancel if her OBGYN fills the prescription.

## 2022-09-21 NOTE — Telephone Encounter (Signed)
Patient is returning call.  °

## 2022-09-25 ENCOUNTER — Ambulatory Visit: Payer: 59 | Admitting: Cardiology

## 2022-09-28 ENCOUNTER — Ambulatory Visit: Payer: 59 | Admitting: Cardiology

## 2022-09-28 ENCOUNTER — Ambulatory Visit: Payer: 59 | Admitting: Family Medicine

## 2022-09-28 ENCOUNTER — Other Ambulatory Visit: Payer: 59

## 2022-10-02 ENCOUNTER — Ambulatory Visit: Payer: 59 | Attending: Cardiology | Admitting: Cardiovascular Disease

## 2022-10-02 ENCOUNTER — Encounter: Payer: Self-pay | Admitting: Cardiovascular Disease

## 2022-10-02 VITALS — BP 110/60 | HR 104 | Ht 64.0 in | Wt 327.5 lb

## 2022-10-02 DIAGNOSIS — R600 Localized edema: Secondary | ICD-10-CM | POA: Diagnosis not present

## 2022-10-02 DIAGNOSIS — I10 Essential (primary) hypertension: Secondary | ICD-10-CM

## 2022-10-02 DIAGNOSIS — I4711 Inappropriate sinus tachycardia, so stated: Secondary | ICD-10-CM

## 2022-10-02 DIAGNOSIS — R0602 Shortness of breath: Secondary | ICD-10-CM | POA: Diagnosis not present

## 2022-10-02 DIAGNOSIS — O139 Gestational [pregnancy-induced] hypertension without significant proteinuria, unspecified trimester: Secondary | ICD-10-CM

## 2022-10-02 MED ORDER — FUROSEMIDE 20 MG PO TABS: 20.0000 mg | ORAL_TABLET | Freq: Every day | ORAL | 0 refills | Status: DC

## 2022-10-02 NOTE — Patient Instructions (Addendum)
Medication Instructions:  STOP the Nifedipine  Furosemide: take one 20 mg tablet once daily  *If you need a refill on your cardiac medications before your next appointment, please call your pharmacy*   Lab Work: Your provider would like for you to return in one week to have the following labs drawn: BMET, BNP and TSH.   Please go to the [facility] entrance and check in at the front desk.  You do not need an appointment.  They are open from 7am-6 pm.  You will not need to be fasting.  If you have labs (blood work) drawn today and your tests are completely normal, you will receive your results only by: [place] (if you have MyChart) OR A paper copy in the mail If you have any lab test that is abnormal or we need to change your treatment, we will call you to review the results.   Testing/Procedures: Your physician has requested that you have an echocardiogram. Echocardiography is a painless test that uses sound waves to create images of your heart. It provides your doctor with information about the size and shape of your heart and how well your heart's chambers and valves are working.   You may receive an ultrasound enhancing agent through an IV if needed to better visualize your heart during the echo. This procedure takes approximately one hour.  There are no restrictions for this procedure.  This will take place at [place] ([place]) [address], [place]    Follow-Up: At [facility], you and your health needs are our priority.  As part of our continuing mission to provide you with exceptional heart care, we have created designated Provider Care Teams.  These Care Teams include your primary Cardiologist (physician) and Advanced Practice Providers (APPs -  Physician Assistants and Nurse Practitioners) who all work together to provide you with the care you need, when you need it.  We recommend signing up for the patient portal called  "MyChart".  Sign up information is provided on this After Visit Summary.  MyChart is used to connect with patients for Virtual Visits (Telemedicine).  Patients are able to view lab/test results, encounter notes, upcoming appointments, etc.  Non-urgent messages can be sent to your provider as well.   To learn more about what you can do with MyChart, go to [email or web address].    Your next appointment:   3 month(s)  The format for your next appointment:   In Person  Provider:   You may see Kathlyn [place], MD or one of the following Advanced Practice Providers on your designated Care Team:   Murray Hodgkins, NP Christell Faith, PA-C Cadence Kathlen Mody, PA-C Gerrie Nordmann, NP

## 2022-10-02 NOTE — Progress Notes (Signed)
Cardiology Office Note   Date:  10/02/2022   ID:  Tashanti Dalporto, DOB 1985/11/22, MRN [id number]  PCP:  Marval Regal, NP  Cardiologist:   Kathlyn Sacramento, MD   Chief Complaint  Patient presents with   Other    OD f/u edema no other complaints. Meds reviewed verbally with pt.      History of Present Illness: Jane Jackson is a 37 y.o. female who Is here today for a follow-up visit regarding suspected inappropriate sinus tachycardia.  She has chronic medical conditions including  obesity, elevated blood pressure, gestational diabetes and anxiety. She had worsening palpitations in 2018 when she was pregnant.  Holter monitor showed sinus tachycardia with no other significant arrhythmia.  Echocardiogram showed normal LV systolic function with no significant valvular abnormalities.  Her symptoms improved with small dose Toprol.  She was subsequently switched to diltiazem due to fatigue. She was most recently seen in our office in October 2021.  Last echocardiogram in September 2021 showed normal LV systolic function, normal diastolic function and no significant valvular abnormalities.  Outpatient ZIO monitor in September 2021 showed no significant arrhythmia. She delivered her second baby in October 2023.  Her second pregnancy was complicated by gestational diabetes and  hypertension.  She was treated with nifedipine.  Since delivery, she experienced significant lower extremity edema and was prescribed a short course of furosemide by her OB/GYN with improvement but then the edema worsened after she ran out.  She reports some exertional dyspnea but no chest pain.  Lower extremity edema is worse at the end of the day.  She reports that her palpitations have been stable overall even though she has been off metoprolol and diltiazem.  Past Medical History:  Diagnosis Date   Anxiety    Asthma    Chicken pox    Complication of anesthesia    issue with asthma and intubation   Cystic dysplasia  of one kidney    about 4 months ago-no f/u- largest 19m?   Diverticulitis    Family history of cystic fibrosis    Normal CF screen   Family history of mental retardation    Fibroid, uterine    dx a few months ago   GERD (gastroesophageal reflux disease)    H/O mitral valve prolapse    as a child   History of mitral valve prolapse 09/16/2016   Impaction, bowel ([place]    Inappropriate sinus tachycardia    a. exacerbated by pregnancy - 2018; b. 01/2017 Holter: 29% of time in sinus tachycardia;  c. 02/2017 Echo: EF 65-70%, no rwma; c. 01/2017 Holter: 29% of time in sinus tachycardia.   Kidney stones    Upper respiratory tract infection 05/27/2020    Past Surgical History:  Procedure Laterality Date   APPENDECTOMY     CHOLECYSTECTOMY N/A 01/18/2016   Procedure: LAPAROSCOPIC CHOLECYSTECTOMY WITH INTRAOPERATIVE CHOLANGIOGRAM;  Surgeon: BExcell Seltzer MD;  Location: [facility];  Service: General;  Laterality: N/A;   ruptured cyst     TONSILLECTOMY AND ADENOIDECTOMY     age 37  TYMPANOSTOMY TUBE PLACEMENT       Current Outpatient Medications  Medication Sig Dispense Refill   albuterol (VENTOLIN HFA) 108 (90 Base) MCG/ACT inhaler Inhale 1 puff into the lungs every 6 (six) hours as needed for wheezing or shortness of breath.     ibuprofen (ADVIL) 600 MG tablet Take 1 tablet (600 mg total) by mouth every 6 (six) hours as needed for fever, headache,  mild pain, moderate pain or cramping. 30 tablet 2   NIFEdipine (PROCARDIA-XL/NIFEDICAL-XL) 30 MG 24 hr tablet Take 1 tablet (30 mg total) by mouth in the morning and at bedtime. (Patient taking differently: Take 30 mg by mouth daily.) 60 tablet 0   Prenatal Vit-Fe Fumarate-FA (PRENATAL MULTIVITAMIN) TABS tablet Take 1 tablet by mouth daily at 12 noon.     furosemide (LASIX) 20 MG tablet Take 1 tablet (20 mg total) by mouth 2 (two) times daily for 5 days. (Patient not taking: Reported on 10/02/2022) 10 tablet 0   No current facility-administered  medications for this visit.    Allergies:   Latex    Social History:  The patient  reports that she quit smoking about 14 years ago. Her smoking use included cigarettes. She has never used smokeless tobacco. She reports that she does not currently use alcohol. She reports that she does not use drugs.   Family History:  The patient's family history includes Alcohol abuse in her father; Arthritis in her father, maternal grandfather, maternal grandmother, mother, paternal grandfather, and paternal grandmother; Cervical cancer in her mother; Colon cancer in her maternal grandfather; Diabetes in her maternal grandmother, mother, and paternal grandmother; Heart disease in her maternal grandfather, maternal grandmother, paternal grandfather, and paternal grandmother; Hypertension in her father, maternal grandfather, maternal grandmother, mother, paternal grandfather, and paternal grandmother; Prostate cancer in her maternal grandfather; Stroke in her maternal grandfather and maternal grandmother; Thyroid disease in her maternal aunt.    ROS:  Please see the history of present illness.   Otherwise, review of systems are positive for none.   All other systems are reviewed and negative.    PHYSICAL EXAM: VS:  BP 110/60 (BP Location: Left Wrist, Patient Position: Sitting, Cuff Size: Large)   Pulse (!) 104   Ht '5\' 4"'$  (1.626 m)   Wt (!) 327 lb 8 oz (148.6 kg)   SpO2 98%   BMI 56.22 kg/m  , BMI Body mass index is 56.22 kg/m. GEN: Well nourished, well developed, in no acute distress  HEENT: normal  Neck: no JVD, carotid bruits, or masses Cardiac: RRR; no murmurs, rubs, or gallops, mild bilateral leg edema. Respiratory:  clear to auscultation bilaterally, normal work of breathing GI: soft, nontender, nondistended, + BS MS: no deformity or atrophy  Skin: warm and dry, no rash Neuro:  Strength and sensation are intact Psych: euthymic mood, full affect   EKG:  EKG is ordered today. The ekg ordered  today demonstrates sinus tachycardia with no significant ST or T wave changes.  Recent Labs: 01/15/2022: TSH 1.510 07/19/2022: Magnesium 2.0 08/13/2022: ALT 32; BUN 13; Creatinine, Ser 0.71; Hemoglobin 13.3; Platelets 349; Potassium 4.4; Sodium 143    Lipid Panel No results found for: "CHOL", "TRIG", "HDL", "CHOLHDL", "VLDL", "LDLCALC", "LDLDIRECT"    Wt Readings from Last 3 Encounters:  10/02/22 (!) 327 lb 8 oz (148.6 kg)  08/13/22 (!) 320 lb (145.2 kg)  07/19/22 (!) 313 lb (142 kg)           No data to display            ASSESSMENT AND PLAN:  1.  Inappropriate sinus tachycardia: Her symptoms are stable overall without medications.  2.  Essential hypertension: Her blood pressure is back to normal since she delivered.  Nifedipine might be contributing to worsening lower extremity edema and thus I elected to discontinue the medication.  3.   Shortness of breath and leg edema: We have to exclude  postpartum cardiomyopathy.  I requested an echocardiogram.  I prescribed Lasix 20 mg once daily.  Will check labs in 1 week including basic metabolic profile, BNP and TSH.   Disposition:   FU in 3 months.  Signed,  Kathlyn Sacramento, MD  10/02/2022 4:57 PM    [place]

## 2022-10-12 ENCOUNTER — Ambulatory Visit: Payer: 59 | Admitting: Family Medicine

## 2022-10-16 ENCOUNTER — Other Ambulatory Visit: Payer: 59

## 2022-10-19 ENCOUNTER — Ambulatory Visit: Payer: 59 | Admitting: Cardiology

## 2022-11-02 ENCOUNTER — Ambulatory Visit: Payer: Medicaid Other | Attending: Cardiovascular Disease

## 2022-11-02 DIAGNOSIS — R0602 Shortness of breath: Secondary | ICD-10-CM | POA: Diagnosis not present

## 2022-11-03 LAB — ECHOCARDIOGRAM COMPLETE
Area-P 1/2: 4.39 cm2
S' Lateral: 3.1 cm

## 2022-11-22 ENCOUNTER — Other Ambulatory Visit: Payer: Self-pay | Admitting: Cardiovascular Disease

## 2022-11-22 DIAGNOSIS — R600 Localized edema: Secondary | ICD-10-CM

## 2023-02-26 DIAGNOSIS — O039 Complete or unspecified spontaneous abortion without complication: Secondary | ICD-10-CM

## 2023-02-26 DIAGNOSIS — O209 Hemorrhage in early pregnancy, unspecified: Secondary | ICD-10-CM

## 2023-03-05 ENCOUNTER — Encounter: Payer: Self-pay | Admitting: Obstetrics and Gynecology

## 2023-03-05 ENCOUNTER — Ambulatory Visit (INDEPENDENT_AMBULATORY_CARE_PROVIDER_SITE_OTHER): Payer: BC Managed Care – PPO | Admitting: Obstetrics and Gynecology

## 2023-03-05 ENCOUNTER — Other Ambulatory Visit (INDEPENDENT_AMBULATORY_CARE_PROVIDER_SITE_OTHER): Payer: BC Managed Care – PPO

## 2023-03-05 VITALS — BP 142/88 | HR 88 | Wt 322.0 lb

## 2023-03-05 DIAGNOSIS — O4690 Antepartum hemorrhage, unspecified, unspecified trimester: Secondary | ICD-10-CM | POA: Diagnosis not present

## 2023-03-05 DIAGNOSIS — O26851 Spotting complicating pregnancy, first trimester: Secondary | ICD-10-CM | POA: Diagnosis not present

## 2023-03-05 DIAGNOSIS — Z3A08 8 weeks gestation of pregnancy: Secondary | ICD-10-CM | POA: Diagnosis not present

## 2023-03-05 DIAGNOSIS — O3680X0 Pregnancy with inconclusive fetal viability, not applicable or unspecified: Secondary | ICD-10-CM

## 2023-03-05 DIAGNOSIS — Z3A01 Less than 8 weeks gestation of pregnancy: Secondary | ICD-10-CM

## 2023-03-05 NOTE — Patient Instructions (Signed)
Please keep an eye on your symptoms. Use heating pads and tylenol 1000mg  every 8 hours as needed for pain.  If you have severe uncontrolled pain or heavy bleeding (more than 1-2 pads per hour for 2 hours) please be seen at the [facility].

## 2023-03-05 NOTE — Progress Notes (Unsigned)
 {  new or return:[id number]} GYNECOLOGY VISIT  Subjective:  Jane Jackson is a 37 y.o. [phone] with LMP *** presenting for ***  CRL 6.20mm  Menstrual Hx:  PMH: PSH: Meds: All: OB: Pap Hx:  Soc: FHx:  Objective:   Vitals:   03/05/23 0955  BP: (!) 144/101  Pulse: 88  Weight: (!) 322 lb (146.1 kg)    General:  Alert, oriented and cooperative. Patient is in no acute distress.  Skin: Skin is warm and dry. No rash noted.   Cardiovascular: Normal heart rate noted  Respiratory: Normal respiratory effort, no problems with respiration noted  Abdomen: Soft, non-tender, non-distended   Pelvic: NEFG.   Exam performed in the presence of a chaperone  Assessment and Plan:  Jane Jackson is a 37 y.o. with ***  1. Vaginal bleeding in pregnancy *** - US OB Limited; Future  2. Encounter to determine fetal viability of pregnancy, single or unspecified fetus *** - US OB Limited; Future   Return in about 1 week (around 03/12/2023) for repeat ultrasound .  Future Appointments  Date Time Provider Department Center  04/05/2023  9:55 AM Alvester Morin, Isa Rankin, MD [facility]    Lennart Pall, MD

## 2023-03-05 NOTE — Progress Notes (Unsigned)
Pt concerned for miscarriage, has had cramping and bleeding off and on since Saturday  Patient informed that the ultrasound is considered a limited obstetric ultrasound and is not intended to be a complete ultrasound exam.  Patient also informed that the ultrasound is not being completed with the intent of assessing for fetal or placental anomalies or any pelvic abnormalities. Explained that the purpose of today's ultrasound is to assess for fetal heart rate.  Patient acknowledges the purpose of the exam and the limitations of the study.     US showed 6wk 5 day IUP, no fetal heart rate.

## 2023-03-08 MED ORDER — MISOPROSTOL 200 MCG PO TABS: ORAL_TABLET | ORAL | 1 refills | Status: DC

## 2023-03-08 MED ORDER — OXYCODONE-ACETAMINOPHEN 5-325 MG PO TABS: 1.0000 | ORAL_TABLET | ORAL | 0 refills | Status: DC | PRN

## 2023-03-08 MED ORDER — ONDANSETRON 4 MG PO TBDP: 4.0000 mg | ORAL_TABLET | Freq: Four times a day (QID) | ORAL | 0 refills | Status: DC | PRN

## 2023-03-12 ENCOUNTER — Other Ambulatory Visit: Payer: BC Managed Care – PPO | Admitting: Obstetrics and Gynecology

## 2023-03-13 ENCOUNTER — Ambulatory Visit (INDEPENDENT_AMBULATORY_CARE_PROVIDER_SITE_OTHER): Payer: BC Managed Care – PPO | Admitting: Family Medicine

## 2023-03-13 ENCOUNTER — Other Ambulatory Visit (INDEPENDENT_AMBULATORY_CARE_PROVIDER_SITE_OTHER): Payer: BC Managed Care – PPO

## 2023-03-13 VITALS — BP 159/101 | HR 92

## 2023-03-13 DIAGNOSIS — Z3A01 Less than 8 weeks gestation of pregnancy: Secondary | ICD-10-CM | POA: Diagnosis not present

## 2023-03-13 DIAGNOSIS — O039 Complete or unspecified spontaneous abortion without complication: Secondary | ICD-10-CM | POA: Diagnosis not present

## 2023-03-13 NOTE — Progress Notes (Signed)
   Subjective:    Patient ID: Jane Jackson is a 37 y.o. female presenting with repeat scan   on 03/13/2023  HPI: Patient is s/p SAB and finished her Cytotec. Still with some bleeding.  Review of Systems  Constitutional:  Negative for chills and fever.  Respiratory:  Negative for shortness of breath.   Cardiovascular:  Negative for chest pain.  Gastrointestinal:  Negative for abdominal pain, nausea and vomiting.  Genitourinary:  Negative for dysuria.  Skin:  Negative for rash.      Objective:    BP (!) 159/101   Pulse 92   LMP 01/04/2023   Breastfeeding Unknown  Physical Exam Exam conducted with a chaperone present.  Constitutional:      General: She is not in acute distress.    Appearance: She is well-developed.  HENT:     Head: Normocephalic and atraumatic.  Eyes:     General: No scleral icterus. Cardiovascular:     Rate and Rhythm: Normal rate.  Pulmonary:     Effort: Pulmonary effort is normal.  Abdominal:     Palpations: Abdomen is soft.  Musculoskeletal:     Cervical back: Neck supple.  Skin:    General: Skin is warm and dry.  Neurological:     Mental Status: She is alert and oriented to person, place, and time.    TVUS today shows thin stripe at < 0.5 cm.     Assessment & Plan:  Miscarriage - complete, discussed usual grieving, ability to try again. - Plan: US OB Limited  Return in about 2 weeks (around 03/27/2023) for a follow-up.  Reva Bores, MD 03/13/2023 5:50 PM

## 2023-03-13 NOTE — Progress Notes (Signed)
Pt here for repeat scan after cytotec, pt is having off and on bleeding and cramping still   Also discusses with pt about BP's, pt can check them at home and will check them weekly and reach out to PCP is they are still elevated

## 2023-04-05 ENCOUNTER — Ambulatory Visit: Payer: BC Managed Care – PPO | Admitting: Family Medicine

## 2023-04-17 ENCOUNTER — Encounter: Payer: BC Managed Care – PPO | Admitting: Family Medicine

## 2023-05-20 ENCOUNTER — Encounter: Payer: Self-pay | Admitting: Obstetrics & Gynecology

## 2023-05-29 ENCOUNTER — Other Ambulatory Visit (INDEPENDENT_AMBULATORY_CARE_PROVIDER_SITE_OTHER): Payer: BC Managed Care – PPO

## 2023-05-29 ENCOUNTER — Ambulatory Visit (INDEPENDENT_AMBULATORY_CARE_PROVIDER_SITE_OTHER): Payer: BC Managed Care – PPO | Admitting: *Deleted

## 2023-05-29 VITALS — BP 142/80 | HR 101 | Wt 320.0 lb

## 2023-05-29 DIAGNOSIS — Z3A10 10 weeks gestation of pregnancy: Secondary | ICD-10-CM

## 2023-05-29 DIAGNOSIS — Z6841 Body Mass Index (BMI) 40.0 and over, adult: Secondary | ICD-10-CM | POA: Insufficient documentation

## 2023-05-29 DIAGNOSIS — Z3481 Encounter for supervision of other normal pregnancy, first trimester: Secondary | ICD-10-CM

## 2023-05-29 DIAGNOSIS — Z8759 Personal history of other complications of pregnancy, childbirth and the puerperium: Secondary | ICD-10-CM | POA: Insufficient documentation

## 2023-05-29 DIAGNOSIS — O0990 Supervision of high risk pregnancy, unspecified, unspecified trimester: Secondary | ICD-10-CM | POA: Insufficient documentation

## 2023-05-29 DIAGNOSIS — O3680X0 Pregnancy with inconclusive fetal viability, not applicable or unspecified: Secondary | ICD-10-CM

## 2023-05-29 NOTE — Progress Notes (Signed)
New OB Intake  I explained I am completing New OB Intake today. We discussed EDD of 12/23/2023, by Ultrasound. Pt is [id number]. I reviewed her allergies, medications and Medical/Surgical/OB history.    Patient Active Problem List   Diagnosis Date Noted   Supervision of high risk pregnancy, antepartum 05/29/2023   Obesity, morbid, BMI 40.0-49.9 (HCC) 05/29/2023   History of gestational hypertension 05/29/2023   History of gestational diabetes in prior pregnancy, currently pregnant 03/06/2022   Prediabetes 01/16/2022   Uncomplicated asthma 09/16/2016    Concerns addressed today  Patient informed that the ultrasound is considered a limited obstetric ultrasound and is not intended to be a complete ultrasound exam.  Patient also informed that the ultrasound is not being completed with the intent of assessing for fetal or placental anomalies or any pelvic abnormalities. Explained that the purpose of today's ultrasound is to assess for viability.  Patient acknowledges the purpose of the exam and the limitations of the study.     Delivery Plans Plans to deliver at [facility]. Discussed the nature of our practice with multiple providers including residents and students. Due to the size of the practice, the delivering provider may not be the same as those providing prenatal care.   MyChart/Babyscripts MyChart access verified. I explained pt will have some visits in office and some virtually. Babyscripts app discussed and ordered.   Blood Pressure Cuff Blood pressure cuff discussed and given Discussed to be used for virtual visits and or if needed BP checks weekly.  Anatomy US Explained first scheduled [place] will be around 19 weeks.   Last Pap Diagnosis  Date Value Ref Range Status  01/15/2022   Final   - Negative for intraepithelial lesion or malignancy (NILM)    First visit review I reviewed new OB appt with patient. Explained pt will be seen by Dr Macon Large at first visit. Discussed Avelina Laine  genetic screening with patient will collect at [facility]. Routine prenatal labs to be collected at [facility].    Scheryl Marten, RN 05/29/2023  11:18 AM

## 2023-06-10 ENCOUNTER — Ambulatory Visit (INDEPENDENT_AMBULATORY_CARE_PROVIDER_SITE_OTHER): Payer: BC Managed Care – PPO | Admitting: Obstetrics & Gynecology

## 2023-06-10 ENCOUNTER — Encounter: Payer: Self-pay | Admitting: Obstetrics & Gynecology

## 2023-06-10 ENCOUNTER — Other Ambulatory Visit (HOSPITAL_COMMUNITY)
Admission: RE | Admit: 2023-06-10 | Discharge: 2023-06-10 | Disposition: A | Discharge status: Home / Self Care | Payer: BC Managed Care – PPO | Source: Ambulatory Visit | Attending: Obstetrics & Gynecology | Admitting: Obstetrics & Gynecology

## 2023-06-10 VITALS — BP 131/92 | HR 94 | Wt 321.0 lb

## 2023-06-10 DIAGNOSIS — O0990 Supervision of high risk pregnancy, unspecified, unspecified trimester: Secondary | ICD-10-CM

## 2023-06-10 DIAGNOSIS — Z113 Encounter for screening for infections with a predominantly sexual mode of transmission: Secondary | ICD-10-CM | POA: Insufficient documentation

## 2023-06-10 DIAGNOSIS — Z3A12 12 weeks gestation of pregnancy: Secondary | ICD-10-CM

## 2023-06-10 DIAGNOSIS — Z8632 Personal history of gestational diabetes: Secondary | ICD-10-CM

## 2023-06-10 DIAGNOSIS — O219 Vomiting of pregnancy, unspecified: Secondary | ICD-10-CM | POA: Diagnosis not present

## 2023-06-10 DIAGNOSIS — O09529 Supervision of elderly multigravida, unspecified trimester: Secondary | ICD-10-CM

## 2023-06-10 DIAGNOSIS — O10919 Unspecified pre-existing hypertension complicating pregnancy, unspecified trimester: Secondary | ICD-10-CM | POA: Insufficient documentation

## 2023-06-10 DIAGNOSIS — O99210 Obesity complicating pregnancy, unspecified trimester: Secondary | ICD-10-CM

## 2023-06-10 DIAGNOSIS — O09299 Supervision of pregnancy with other poor reproductive or obstetric history, unspecified trimester: Secondary | ICD-10-CM | POA: Diagnosis not present

## 2023-06-10 DIAGNOSIS — O0991 Supervision of high risk pregnancy, unspecified, first trimester: Secondary | ICD-10-CM | POA: Diagnosis not present

## 2023-06-10 DIAGNOSIS — Z1339 Encounter for screening examination for other mental health and behavioral disorders: Secondary | ICD-10-CM

## 2023-06-10 MED ORDER — ONDANSETRON 4 MG PO TBDP
4.0000 mg | ORAL_TABLET | Freq: Four times a day (QID) | ORAL | 0 refills | Status: DC | PRN
Start: 2023-06-10 — End: 2023-12-18

## 2023-06-10 MED ORDER — LABETALOL HCL 200 MG PO TABS
100.0000 mg | ORAL_TABLET | Freq: Two times a day (BID) | ORAL | 3 refills | Status: DC
Start: 2023-06-10 — End: 2023-07-08

## 2023-06-10 MED ORDER — ASPIRIN 81 MG PO TBEC
162.0000 mg | DELAYED_RELEASE_TABLET | Freq: Every day | ORAL | 2 refills | Status: DC
Start: 2023-06-10 — End: 2023-12-18

## 2023-06-10 NOTE — Progress Notes (Signed)
History:   Jane Jackson is a 37 y.o. [id number] at [redacted]w[redacted]d by 10 week ultrasound being seen today for her first obstetrical visit.  Her obstetrical history is significant for  essential HTN, h/o inappropriate sinus tachycardia in 2018 pregnancy, h/o GDM in last pregnancy and morbid obesity . Had two term vaginal deliveries, last one was vacuum assisted Patient does intend to breast feed. Pregnancy history fully reviewed.  Patient reports nausea and vomiting. Desires prescription to help.      HISTORY: OB History  Gravida Para Term Preterm AB Living  4 2 2  0 1 2  SAB IAB Ectopic Multiple Live Births  1 0 0 0 2    # Outcome Date GA Lbr Len/2nd Weight Sex Type Anes PTL Lv  4 Current           3 SAB 02/2023          2 Term 07/06/22 [redacted]w[redacted]d / 168:15 7 lb 0.2 oz (3.18 kg) M Vag-Vacuum EPI  LIV     Birth Comments: WDL     Name: Jane Jackson     Apgar1: 8  Apgar5: 9  1 Term 03/13/17 [redacted]w[redacted]d 24:30 / 00:47 6 lb 4.5 oz (2.85 kg) M Vag-Spont EPI  LIV     Name: Jane Jackson     Apgar1: 6  Apgar5: 8    Last pap smear was done 01/15/2022 and was normal  Past Medical History:  Diagnosis Date   Anxiety    Asthma    Chicken pox    Complication of anesthesia    issue with asthma and intubation   Cystic dysplasia of one kidney    about 4 months ago-no f/u- largest 5mm?   Diverticulitis    Family history of cystic fibrosis    Normal CF screen   Family history of mental retardation    Fibroid, uterine    dx a few months ago   GERD (gastroesophageal reflux disease)    Gestational diabetes    H/O mitral valve prolapse    as a child   History of mitral valve prolapse 09/16/2016   Hypertension    Impaction, bowel (HCC)    Inappropriate sinus tachycardia    a. exacerbated by pregnancy - 2018; b. 01/2017 Holter: 29% of time in sinus tachycardia;  c. 02/2017 Echo: EF 65-70%, no rwma; c. 01/2017 Holter: 29% of time in sinus tachycardia.   Kidney stones    Upper respiratory tract infection  05/27/2020   Past Surgical History:  Procedure Laterality Date   APPENDECTOMY     CHOLECYSTECTOMY N/A 01/18/2016   Procedure: LAPAROSCOPIC CHOLECYSTECTOMY WITH INTRAOPERATIVE CHOLANGIOGRAM;  Surgeon: Glenna Fellows, MD;  Location: [facility];  Service: General;  Laterality: N/A;   ruptured cyst     TONSILLECTOMY AND ADENOIDECTOMY     age 8   TYMPANOSTOMY TUBE PLACEMENT     Family History  Problem Relation Age of Onset   Arthritis Mother    Hypertension Mother    Diabetes Mother    Cervical cancer Mother    Alcohol abuse Father    Arthritis Father    Hypertension Father    Arthritis Maternal Grandmother    Heart disease Maternal Grandmother    Stroke Maternal Grandmother    Hypertension Maternal Grandmother    Diabetes Maternal Grandmother    Arthritis Maternal Grandfather    Colon cancer Maternal Grandfather    Prostate cancer Maternal Grandfather    Heart disease Maternal Grandfather    Stroke Maternal  Grandfather    Hypertension Maternal Grandfather    Arthritis Paternal Grandmother    Heart disease Paternal Grandmother    Hypertension Paternal Grandmother    Diabetes Paternal Grandmother    Arthritis Paternal Grandfather    Heart disease Paternal Grandfather    Hypertension Paternal Grandfather    Thyroid disease Maternal Aunt        thyroid cancer   Social History   Tobacco Use   Smoking status: Former    Current packs/day: 0.00    Types: Cigarettes    Quit date: 03/03/2008    Years since quitting: 15.2   Smokeless tobacco: Never   Tobacco comments:    Smoked occ, no smoking for years, per pt  Vaping Use   Vaping status: Never Used  Substance Use Topics   Alcohol use: Not Currently    Comment: last use 10/28/2021 "one drink"   Drug use: No   Allergies  Allergen Reactions   Latex Itching and Rash   Current Outpatient Medications on File Prior to Visit  Medication Sig Dispense Refill   albuterol (VENTOLIN HFA) 108 (90 Base) MCG/ACT inhaler Inhale 1 puff  into the lungs every 6 (six) hours as needed for wheezing or shortness of breath.     Prenatal Vit-Fe Fumarate-FA (PRENATAL MULTIVITAMIN) TABS tablet Take 1 tablet by mouth daily at 12 noon.     No current facility-administered medications on file prior to visit.    Review of Systems Pertinent items noted in HPI and remainder of comprehensive ROS otherwise negative.  Physical Exam:   Vitals:   06/10/23 1335  BP: (!) 131/92  Pulse: 94  Weight: (!) 321 lb (145.6 kg)   Fetal Heart Rate (bpm): 160 on bedside ultrasound for FHR check: Viable intrauterine pregnancy with positive cardiac activity noted Patient informed that the ultrasound is considered a limited obstetric ultrasound and is not intended to be a complete ultrasound exam.  Patient also informed that the ultrasound is not being completed with the intent of assessing for fetal or placental anomalies or any pelvic abnormalities.  Explained that the purpose of today's ultrasound is to assess for fetal heart rate.  Patient acknowledges the purpose of the exam and the limitations of the study. General: well-developed, well-nourished female in no acute distress  Breasts:  deferred  Skin: normal coloration and turgor, no rashes  Neurologic: oriented, normal, negative, normal mood  Extremities: normal strength, tone, and muscle mass, ROM of all joints is normal  HEENT PERRLA, extraocular movement intact and sclera clear, anicteric  Neck supple and no masses  Cardiovascular: regular rate and rhythm  Respiratory:  no respiratory distress, normal breath sounds  Abdomen: soft, non-tender; bowel sounds normal; no masses,  no organomegaly  Pelvic: deferred    Assessment:    Pregnancy: [id number] Patient Active Problem List   Diagnosis Date Noted   Preexisting hypertension complicating pregnancy, antepartum 06/10/2023   Supervision of high risk pregnancy, antepartum 05/29/2023   History of gestational diabetes in prior pregnancy, currently  pregnant 03/06/2022   Prediabetes 01/16/2022   Uncomplicated asthma 09/16/2016   Maternal morbid obesity, antepartum (HCC) 01/14/2016     Plan:    1. Preexisting hypertension complicating pregnancy, antepartum [x ] Baseline labs (CBC, CMP, urine Pr:Cr) [x]  Aspirin 162 mg daily prescribed after 12 weeks [ ]  Serial growth scans 20-24-28-32-35-38 [ ]  Antenatal testing starting at 32 weeks [ ]  Delivery by 39 weeks (with meds, 40 with no meds) or earlier if needed Discussed implications of  CHTN in pregnancy, need for antenatal testing and frequent ultrasounds/prenatal visits, need for optimizing BP control to decrease CHTN/preeclampsia associated maternal-fetal morbidity and mortality. Will check baseline labs today.  Labetalol started for BP control (Nifedipine was stopped after last pregnancy due to concerns about peripheral edema) - labetalol (NORMODYNE) 200 MG tablet; Take 0.5 tablets (100 mg total) by mouth 2 (two) times daily.  Dispense: 60 tablet; Refill: 3  2. History of gestational diabetes in prior pregnancy, currently pregnant A1C done today. Patient is hesitance about doing 2 hr GTT again.  She was told she can just do fingersticks/GDM surveillance if A1C concerning as an alternative.  3. Nausea and vomiting during pregnancy Zofran prescribed.  4. Antepartum multigravida of advanced maternal age - 45 PRENATAL TEST - [place]ON Basic Panel  5. Maternal morbid obesity, antepartum (HCC) Labs done, referral to Cardio Obstetrics.  - Hemoglobin A1c - Comprehensive metabolic panel - TSH Rfx on Abnormal to Free T4 - Protein / creatinine ratio, urine - [place] MFM OB DETAIL +14 WK; Future - aspirin EC 81 MG tablet; Take 2 tablets (162 mg total) by mouth at bedtime. Start taking when you are [redacted] weeks pregnant for rest of pregnancy for prevention of preeclampsia  Dispense: 300 tablet; Refill: 2 - AMB Referral to Cardio Obstetrics  6. [redacted] weeks gestation of pregnancy 7. Supervision of  high risk pregnancy, antepartum - CBC/D/Plt+RPR+Rh+ABO+RubIgG... - Culture, OB Urine - Cervicovaginal ancillary only  Initial labs drawn. Continue prenatal vitamins. Problem list reviewed and updated. Genetic Screening discussed, Panorama and Horizon: ordered. Ultrasound discussed; fetal anatomic survey: scheduled. Anticipatory guidance about prenatal visits given including labs, ultrasounds, and testing. Weight gain recommendations per IOM guidelines reviewed: underweight/BMI 18.5 or less > 28 - 40 lbs; normal weight/BMI 18.5 - 24.9 > 25 - 35 lbs; overweight/BMI 25 - 29.9 > 15 - 25 lbs; obese/BMI  30 or more > 11 - 20 lbs. Discussed usage of the Babyscripts app for more information about pregnancy, and to track blood pressures. Also discussed usage of virtual visits as additional source of managing and completing prenatal visits.  Patient was encouraged to use MyChart to review results, send requests, and have questions addressed.   The nature of Center for [facility] with multiple MDs and Advanced Practice Providers was explained to patient; also emphasized that residents, students are part of our team. Routine obstetric precautions reviewed. Encouraged to seek out care at our office or emergency room [facility] preferred) for urgent and/or emergent concerns. Return in about 4 weeks (around 07/08/2023) for OFFICE OB VISIT (MD only).     Jaynie Collins, MD, FACOG Obstetrician & Gynecologist, [facility] for [employer], [facility]

## 2023-06-11 LAB — CBC/D/PLT+RPR+RH+ABO+RUBIGG...
Antibody Screen: NEGATIVE
Basophils Absolute: 0 10*3/uL (ref 0.0–0.2)
Basos: 0 %
EOS (ABSOLUTE): 0.2 10*3/uL (ref 0.0–0.4)
Eos: 1 %
HCV Ab: NONREACTIVE
HIV Screen 4th Generation wRfx: NONREACTIVE
Hematocrit: 40.8 % (ref 34.0–46.6)
Hemoglobin: 13.9 g/dL (ref 11.1–15.9)
Hepatitis B Surface Ag: NEGATIVE
Immature Grans (Abs): 0 10*3/uL (ref 0.0–0.1)
Immature Granulocytes: 0 %
Lymphocytes Absolute: 1.8 10*3/uL (ref 0.7–3.1)
Lymphs: 15 %
MCH: 30.1 pg (ref 26.6–33.0)
MCHC: 34.1 g/dL (ref 31.5–35.7)
MCV: 88 fL (ref 79–97)
Monocytes Absolute: 0.6 10*3/uL (ref 0.1–0.9)
Monocytes: 5 %
Neutrophils Absolute: 9.2 10*3/uL — ABNORMAL HIGH (ref 1.4–7.0)
Neutrophils: 79 %
Platelets: 293 10*3/uL (ref 150–450)
RBC: 4.62 x10E6/uL (ref 3.77–5.28)
RDW: 13.6 % (ref 11.7–15.4)
RPR Ser Ql: NONREACTIVE
Rh Factor: POSITIVE
Rubella Antibodies, IGG: 4.51 {index} (ref >0.99)
WBC: 11.7 10*3/uL — ABNORMAL HIGH (ref 3.4–10.8)

## 2023-06-11 LAB — COMPREHENSIVE METABOLIC PANEL
ALT: 9 IU/L (ref 0–32)
AST: 12 IU/L (ref 0–40)
Albumin: 4 g/dL (ref 3.9–4.9)
Alkaline Phosphatase: 79 IU/L (ref 44–121)
BUN/Creatinine Ratio: 11 (ref 9–23)
BUN: 7 mg/dL (ref 6–20)
Bilirubin Total: 0.2 mg/dL (ref 0.0–1.2)
CO2: 20 mmol/L (ref 20–29)
Calcium: 9.6 mg/dL (ref 8.7–10.2)
Chloride: 100 mmol/L (ref 96–106)
Creatinine, Ser: 0.63 mg/dL (ref 0.57–1.00)
Globulin, Total: 2.3 g/dL (ref 1.5–4.5)
Glucose: 91 mg/dL (ref 70–99)
Potassium: 4.2 mmol/L (ref 3.5–5.2)
Sodium: 136 mmol/L (ref 134–144)
Total Protein: 6.3 g/dL (ref 6.0–8.5)
eGFR: 116 mL/min/{1.73_m2} (ref >59)

## 2023-06-11 LAB — HEMOGLOBIN A1C
Est. average glucose Bld gHb Est-mCnc: 117 mg/dL
Hgb A1c MFr Bld: 5.7 % — ABNORMAL HIGH (ref 4.8–5.6)

## 2023-06-11 LAB — PROTEIN / CREATININE RATIO, URINE
Creatinine, Urine: 346.7 mg/dL
Protein, Ur: 60.5 mg/dL
Protein/Creat Ratio: 175 mg/g{creat} (ref 0–200)

## 2023-06-11 LAB — TSH RFX ON ABNORMAL TO FREE T4: TSH: 1.16 u[IU]/mL (ref 0.450–4.500)

## 2023-06-11 LAB — HCV INTERPRETATION

## 2023-06-13 ENCOUNTER — Telehealth: Payer: Self-pay | Admitting: *Deleted

## 2023-06-13 NOTE — Telephone Encounter (Signed)
Left message for pt to reach out to get her early glucose test scheduled.

## 2023-06-13 NOTE — Telephone Encounter (Signed)
-----   Message from Woodburn Anyanwu sent at 06/11/2023  2:58 PM EDT ----- Recommend early 2 hr GTT given prediabetes and history of gestational diabetes

## 2023-06-17 LAB — PANORAMA PRENATAL TEST FULL PANEL:PANORAMA TEST PLUS 5 ADDITIONAL MICRODELETIONS: FETAL FRACTION: 6.2

## 2023-06-19 LAB — HORIZON CUSTOM: REPORT SUMMARY: NEGATIVE

## 2023-07-02 ENCOUNTER — Encounter: Payer: BC Managed Care – PPO | Admitting: Obstetrics & Gynecology

## 2023-07-05 ENCOUNTER — Other Ambulatory Visit: Payer: Self-pay

## 2023-07-05 ENCOUNTER — Other Ambulatory Visit: Payer: BC Managed Care – PPO

## 2023-07-05 DIAGNOSIS — O09299 Supervision of pregnancy with other poor reproductive or obstetric history, unspecified trimester: Secondary | ICD-10-CM

## 2023-07-05 DIAGNOSIS — O10919 Unspecified pre-existing hypertension complicating pregnancy, unspecified trimester: Secondary | ICD-10-CM

## 2023-07-05 MED ORDER — NIFEDIPINE ER OSMOTIC RELEASE 30 MG PO TB24
30.0000 mg | ORAL_TABLET | Freq: Every day | ORAL | 4 refills | Status: DC
Start: 2023-07-05 — End: 2023-10-08

## 2023-07-05 NOTE — Progress Notes (Signed)
Rx changed per pt request due to contraindications w/ Asthma for labetalol  Consulted with provider Rx changed to Procardia 30 xl every day.

## 2023-07-06 LAB — GLUCOSE TOLERANCE, 2 HOURS W/ 1HR
Glucose, 1 hour: 132 mg/dL (ref 70–179)
Glucose, 2 hour: 117 mg/dL (ref 70–152)
Glucose, Fasting: 108 mg/dL — ABNORMAL HIGH (ref 70–91)

## 2023-07-08 ENCOUNTER — Ambulatory Visit (INDEPENDENT_AMBULATORY_CARE_PROVIDER_SITE_OTHER): Payer: BC Managed Care – PPO | Admitting: Obstetrics & Gynecology

## 2023-07-08 VITALS — BP 132/83 | HR 97 | Wt 323.0 lb

## 2023-07-08 DIAGNOSIS — R7302 Impaired glucose tolerance (oral): Secondary | ICD-10-CM

## 2023-07-08 DIAGNOSIS — Z3A16 16 weeks gestation of pregnancy: Secondary | ICD-10-CM

## 2023-07-08 DIAGNOSIS — O24419 Gestational diabetes mellitus in pregnancy, unspecified control: Secondary | ICD-10-CM | POA: Insufficient documentation

## 2023-07-08 DIAGNOSIS — O10919 Unspecified pre-existing hypertension complicating pregnancy, unspecified trimester: Secondary | ICD-10-CM

## 2023-07-08 DIAGNOSIS — O0990 Supervision of high risk pregnancy, unspecified, unspecified trimester: Secondary | ICD-10-CM

## 2023-07-08 DIAGNOSIS — O99210 Obesity complicating pregnancy, unspecified trimester: Secondary | ICD-10-CM

## 2023-07-08 HISTORY — DX: Impaired glucose tolerance (oral): R73.02

## 2023-07-08 NOTE — Progress Notes (Signed)
ROB: Needs refill on zofran

## 2023-07-08 NOTE — Progress Notes (Addendum)
   PRENATAL VISIT NOTE  Subjective:  Jane Jackson is a 37 y.o. [id number] at [redacted]w[redacted]d being seen today for ongoing prenatal care.  She is currently monitored for the following issues for this high-risk pregnancy and has Maternal morbid obesity, antepartum (HCC); Uncomplicated asthma; Prediabetes; History of gestational diabetes in prior pregnancy, currently pregnant; Supervision of high risk pregnancy, antepartum; Preexisting hypertension complicating pregnancy, antepartum; and Gestational diabetes mellitus, antepartum on their problem list.  Patient reports no complaints. Patient denies any headaches, visual symptoms, RUQ/epigastric pain or other concerning symptoms.   Contractions: Not present. Vag. Bleeding: None.  Movement: Present. Denies leaking of fluid.   The following portions of the patient's history were reviewed and updated as appropriate: allergies, current medications, past family history, past medical history, past social history, past surgical history and problem list.   Objective:   Vitals:   07/08/23 1633 07/08/23 1659  BP: (!) 159/86 132/83  Pulse: 87 97  Weight: (!) 323 lb (146.5 kg)     Fetal Status: Fetal Heart Rate (bpm): 152   Movement: Present     General:  Alert, oriented and cooperative. Patient is in no acute distress.  Skin: Skin is warm and dry. No rash noted.   Cardiovascular: Normal heart rate noted  Respiratory: Normal respiratory effort, no problems with respiration noted  Abdomen: Soft, gravid, appropriate for gestational age.  Pain/Pressure: Absent     Pelvic: Cervical exam deferred        Extremities: Normal range of motion.  Edema: None  Mental Status: Normal mood and affect. Normal behavior. Normal judgment and thought content.   Results for orders placed or performed in visit on 07/05/23 (from the past 168 hour(s))  Glucose Tolerance, 2 Hours w/1 Hour   Collection Time: 07/05/23  9:38 AM  Result Value Ref Range   Glucose, Fasting 108 (H) 70 - 91  mg/dL   Glucose, 1 hour 914 70 - 179 mg/dL   Glucose, 2 hour 782 70 - 152 mg/dL    Assessment and Plan:  Pregnancy: [id number] at [redacted]w[redacted]d 1. Gestational diabetes mellitus (GDM), antepartum Patient has GDM again. She desires Dexcom, this will be ordered for her. She declines DM education.   2. Preexisting hypertension complicating pregnancy, antepartum Initial elevated BP today, but patient did not take her medication today yet. Labs added on to her AFP.  Reinitiated OB Cards referral that was closed.  - AFP, Serum, Open Spina Bifida - CBC - Comprehensive metabolic panel - AMB Referral to Cardio Obstetrics - Protein / creatinine ratio, urine  3. Maternal morbid obesity, antepartum (HCC) TWG 1 lb - AMB Referral to Cardio Obstetrics  4. [redacted] weeks gestation of pregnancy 5. Supervision of high risk pregnancy, antepartum - AFP, Serum, Open Spina Bifida done.  Anatomy scan done.  No other complaints or concerns.  Routine obstetric precautions reviewed.  Please refer to After Visit Summary for other counseling recommendations.   Return in about 4 weeks (around 08/05/2023) for OFFICE OB VISIT (MD only).  Future Appointments  Date Time Provider Department Center  08/02/2023 12:15 PM [facility] NURSE [facility]  08/02/2023 12:30 PM [facility] US2 [facility]  08/05/2023  3:50 PM Melizza Kanode, Jethro Bastos, MD [facility]    Jaynie Collins, MD

## 2023-07-09 ENCOUNTER — Other Ambulatory Visit: Payer: Self-pay | Admitting: *Deleted

## 2023-07-09 MED ORDER — DEXCOM G6 TRANSMITTER MISC: 1.0000 | 2 refills | Status: DC

## 2023-07-09 MED ORDER — DEXCOM G6 SENSOR MISC: 1.0000 | 6 refills | Status: DC

## 2023-07-10 ENCOUNTER — Telehealth: Payer: Self-pay | Admitting: Cardiovascular Disease

## 2023-07-10 LAB — CBC
Hematocrit: 37 % (ref 34.0–46.6)
Hemoglobin: 12.4 g/dL (ref 11.1–15.9)
MCH: 29.9 pg (ref 26.6–33.0)
MCHC: 33.5 g/dL (ref 31.5–35.7)
MCV: 89 fL (ref 79–97)
Platelets: 257 10*3/uL (ref 150–450)
RBC: 4.15 x10E6/uL (ref 3.77–5.28)
RDW: 13.6 % (ref 11.7–15.4)
WBC: 10 10*3/uL (ref 3.4–10.8)

## 2023-07-10 LAB — AFP, SERUM, OPEN SPINA BIFIDA
AFP MoM: 1.05
AFP Value: 22.6 ng/mL
Gest. Age on Collection Date: 16 wk
Maternal Age At EDD: 40.1 a
OSBR Risk 1 IN: 10000
Test Results:: NEGATIVE
Weight: 323 [lb_av]

## 2023-07-10 LAB — COMPREHENSIVE METABOLIC PANEL
ALT: 7 [IU]/L (ref 0–32)
AST: 8 [IU]/L (ref 0–40)
Albumin: 3.8 g/dL — ABNORMAL LOW (ref 3.9–4.9)
Alkaline Phosphatase: 66 [IU]/L (ref 44–121)
BUN/Creatinine Ratio: 14 (ref 9–23)
BUN: 9 mg/dL (ref 6–20)
Bilirubin Total: <0.2 mg/dL (ref 0.0–1.2)
CO2: 20 mmol/L (ref 20–29)
Calcium: 9.4 mg/dL (ref 8.7–10.2)
Chloride: 105 mmol/L (ref 96–106)
Creatinine, Ser: 0.66 mg/dL (ref 0.57–1.00)
Globulin, Total: 2.3 g/dL (ref 1.5–4.5)
Glucose: 87 mg/dL (ref 70–99)
Potassium: 3.8 mmol/L (ref 3.5–5.2)
Sodium: 139 mmol/L (ref 134–144)
Total Protein: 6.1 g/dL (ref 6.0–8.5)
eGFR: 114 mL/min/{1.73_m2} (ref >59)

## 2023-07-10 LAB — PROTEIN / CREATININE RATIO, URINE
Creatinine, Urine: 258.2 mg/dL
Protein, Ur: 13.4 mg/dL
Protein/Creat Ratio: 52 mg/g{creat} (ref 0–200)

## 2023-07-10 NOTE — Telephone Encounter (Signed)
Pt c/o medication issue:  1. Name of Medication:  NIFEdipine (PROCARDIA-XL/NIFEDICAL-XL) 30 MG 24 hr tablet  2. How are you currently taking this medication (dosage and times per day)?  Once daily  3. Are you having a reaction (difficulty breathing--STAT)?    4. What is your medication issue?  Patient states Procardia has been prescribed and she feels really bad. She states her HR has been in the 100's, BP is fluctuating, and she has been having headaches.

## 2023-07-10 NOTE — Telephone Encounter (Signed)
2nd attempt to contact pt. Left message asking her to call back.

## 2023-07-10 NOTE — Telephone Encounter (Signed)
Left message for pt to call back  °

## 2023-07-11 NOTE — Telephone Encounter (Signed)
Patient is returning call and is requesting call back.

## 2023-07-11 NOTE — Telephone Encounter (Signed)
Patient called back, advised that she is [redacted] weeks pregnant. She states her blood pressure will spike at times, and her OB was concerned (notes in epic) advised that they had recommended she go on medication, see note:  Rx changed per pt request due to contraindications w/ Asthma for labetalol  Consulted with provider Rx changed to Procardia 30 xl every day.    She states she is pretty sure this is the medication (procardia) that Dr.Arida took her off of due to swelling previously. She states she has been taking it for 4 days now and the swelling as returned, she feels terrible on it, and has a headache. She states without medications at rest her blood pressure normally runs 132/83 HR 97, she advised that they recommended she see Dr.Tobb with Cardio OB- but unable to see her until November. She is wanting to see if Dr.Arida recommends anything as she can not wait this long feeling this way on the medications. She would like to get a cardiologist recommendations on the blood pressure medications if possible.

## 2023-07-12 NOTE — Telephone Encounter (Signed)
Called patient, LVM to call back to discuss.  Left call back number.   

## 2023-07-12 NOTE — Telephone Encounter (Signed)
There are limited safe antihypertensive medications in pregnancy.  Labetalol and Procardia are usually first-line treatment.  If we are not able to use those, we can consider hydralazine 25 mg 3 times daily but her tachycardia might worsen so she has to monitor her blood pressure and heart rate. So the options include continuing Procardia or trying hydralazine 25 mg 3 times daily.

## 2023-07-16 ENCOUNTER — Encounter: Payer: Self-pay | Admitting: Cardiovascular Disease

## 2023-07-16 NOTE — Telephone Encounter (Signed)
See MyChart message

## 2023-07-30 ENCOUNTER — Ambulatory Visit: Payer: BC Managed Care – PPO

## 2023-07-31 ENCOUNTER — Encounter: Payer: Self-pay | Admitting: *Deleted

## 2023-08-01 ENCOUNTER — Telehealth: Payer: Self-pay | Admitting: Obstetrics & Gynecology

## 2023-08-01 NOTE — Telephone Encounter (Addendum)
Received voicemail from patient stating that her BP has been high lately, she is doing Procardia 30mg  daily, Pt is also 19w 3d.    Pt stating that her bp today is 130/90 sitting or standing  However when she is laying down her bp is fine    Pt denies any blurred vision, pt does endorse slight headache however this headache per patient is nothing out of the ordinary for her.    Please advise

## 2023-08-02 ENCOUNTER — Other Ambulatory Visit: Payer: Self-pay

## 2023-08-02 ENCOUNTER — Other Ambulatory Visit: Payer: Self-pay | Admitting: *Deleted

## 2023-08-02 ENCOUNTER — Ambulatory Visit: Payer: BC Managed Care – PPO | Attending: Obstetrics & Gynecology

## 2023-08-02 ENCOUNTER — Ambulatory Visit: Payer: BC Managed Care – PPO

## 2023-08-02 DIAGNOSIS — O10012 Pre-existing essential hypertension complicating pregnancy, second trimester: Secondary | ICD-10-CM

## 2023-08-02 DIAGNOSIS — O99210 Obesity complicating pregnancy, unspecified trimester: Secondary | ICD-10-CM | POA: Diagnosis present

## 2023-08-02 DIAGNOSIS — O24419 Gestational diabetes mellitus in pregnancy, unspecified control: Secondary | ICD-10-CM | POA: Diagnosis not present

## 2023-08-02 DIAGNOSIS — O99212 Obesity complicating pregnancy, second trimester: Secondary | ICD-10-CM

## 2023-08-02 DIAGNOSIS — O09522 Supervision of elderly multigravida, second trimester: Secondary | ICD-10-CM

## 2023-08-02 DIAGNOSIS — O10912 Unspecified pre-existing hypertension complicating pregnancy, second trimester: Secondary | ICD-10-CM

## 2023-08-02 DIAGNOSIS — O09292 Supervision of pregnancy with other poor reproductive or obstetric history, second trimester: Secondary | ICD-10-CM

## 2023-08-02 DIAGNOSIS — Z3A19 19 weeks gestation of pregnancy: Secondary | ICD-10-CM

## 2023-08-05 ENCOUNTER — Encounter: Payer: BC Managed Care – PPO | Admitting: Obstetrics & Gynecology

## 2023-08-15 ENCOUNTER — Telehealth: Payer: Self-pay

## 2023-08-15 NOTE — Telephone Encounter (Signed)
Called pt to get scheduled for upcoming appt. Left message for pt to call office back to get scheduled.

## 2023-08-16 ENCOUNTER — Ambulatory Visit: Payer: BC Managed Care – PPO | Attending: Cardiology

## 2023-08-16 ENCOUNTER — Ambulatory Visit (INDEPENDENT_AMBULATORY_CARE_PROVIDER_SITE_OTHER): Payer: BC Managed Care – PPO | Admitting: Cardiology

## 2023-08-16 ENCOUNTER — Encounter: Payer: Self-pay | Admitting: Cardiology

## 2023-08-16 VITALS — BP 127/84 | HR 91 | Ht 65.0 in | Wt 328.6 lb

## 2023-08-16 DIAGNOSIS — Z8632 Personal history of gestational diabetes: Secondary | ICD-10-CM

## 2023-08-16 DIAGNOSIS — I4711 Inappropriate sinus tachycardia, so stated: Secondary | ICD-10-CM

## 2023-08-16 DIAGNOSIS — I10 Essential (primary) hypertension: Secondary | ICD-10-CM

## 2023-08-16 DIAGNOSIS — Z3A22 22 weeks gestation of pregnancy: Secondary | ICD-10-CM

## 2023-08-16 DIAGNOSIS — O99210 Obesity complicating pregnancy, unspecified trimester: Secondary | ICD-10-CM | POA: Diagnosis not present

## 2023-08-16 DIAGNOSIS — O09299 Supervision of pregnancy with other poor reproductive or obstetric history, unspecified trimester: Secondary | ICD-10-CM | POA: Diagnosis not present

## 2023-08-16 NOTE — Progress Notes (Unsigned)
Cardio-Obstetrics Clinic  New Evaluation  Date:  08/18/2023   ID:  Jane Jackson, DOB [birth year], MRN [id number]  PCP:  Theadore Nan, NP   [facility] Providers Cardiologist:  Lorine Bears, MD  Electrophysiologist:  None       Referring MD: Tereso Newcomer, MD   Chief Complaint: " I am short of breath even at rest"  History of Present Illness:    Jane Jackson is a 37 y.o. female [G4P2012] who is being seen today for the evaluation of shortness of breath at the request of Anyanwu, Jethro Bastos, MD.   Medical history includes inappropriate sinus tachycardia and gestational diabetes, presents with concerns about blood pressure fluctuations during her current pregnancy. She reports that her blood pressure is 'all over the place,' with low readings at rest and high readings with movement. She also experiences episodes of shortness of breath, which occur even when at rest and are not associated with anxiety.  The patient's blood pressure management has varied over time. She was previously on Procardia and Lasix, but stopped all medications when she became pregnant. Recently, her OB put her back on Procardia due to elevated blood pressure readings.  In addition to her cardiovascular concerns, the patient has a history of gestational diabetes. She had this condition during her last pregnancy, but it did not significantly impact her life. However, she reports feeling the effects of gestational diabetes more acutely during her current pregnancy.   Prior CV Studies Reviewed: The following studies were reviewed today: Prior echo  Past Medical History:  Diagnosis Date   Anxiety    Asthma    Chicken pox    Complication of anesthesia    issue with asthma and intubation   Cystic dysplasia of one kidney    about 4 months ago-no f/u- largest 5mm?   Diverticulitis    Family history of cystic fibrosis    Normal CF screen   Family history of mental retardation    Fibroid,  uterine    dx a few months ago   GERD (gastroesophageal reflux disease)    Gestational diabetes    H/O mitral valve prolapse    as a child   History of mitral valve prolapse 09/16/2016   Hypertension    Impaction, bowel (HCC)    Inappropriate sinus tachycardia (HCC)    a. exacerbated by pregnancy - 2018; b. 01/2017 Holter: 29% of time in sinus tachycardia;  c. 02/2017 Echo: EF 65-70%, no rwma; c. 01/2017 Holter: 29% of time in sinus tachycardia.   Kidney stones    Upper respiratory tract infection 05/27/2020    Past Surgical History:  Procedure Laterality Date   APPENDECTOMY     CHOLECYSTECTOMY N/A 01/18/2016   Procedure: LAPAROSCOPIC CHOLECYSTECTOMY WITH INTRAOPERATIVE CHOLANGIOGRAM;  Surgeon: Glenna Fellows, MD;  Location: [facility];  Service: General;  Laterality: N/A;   ruptured cyst     TONSILLECTOMY AND ADENOIDECTOMY     age 32   TYMPANOSTOMY TUBE PLACEMENT        OB History     Gravida  4   Para  2   Term  2   Preterm      AB  1   Living  2      SAB  1   IAB      Ectopic      Multiple  0   Live Births  2               Current  Medications: Current Meds  Medication Sig   albuterol (VENTOLIN HFA) 108 (90 Base) MCG/ACT inhaler Inhale 1 puff into the lungs every 6 (six) hours as needed for wheezing or shortness of breath.   aspirin EC 81 MG tablet Take 2 tablets (162 mg total) by mouth at bedtime. Start taking when you are [redacted] weeks pregnant for rest of pregnancy for prevention of preeclampsia   Continuous Glucose Sensor (DEXCOM G6 SENSOR) MISC 1 Package by Does not apply route every 30 (thirty) days.   Continuous Glucose Transmitter (DEXCOM G6 TRANSMITTER) MISC 1 Package by Does not apply route every 3 (three) months.   NIFEdipine (PROCARDIA-XL/NIFEDICAL-XL) 30 MG 24 hr tablet Take 1 tablet (30 mg total) by mouth daily.   ondansetron (ZOFRAN-ODT) 4 MG disintegrating tablet Take 1 tablet (4 mg total) by mouth every 6 (six) hours as needed for nausea.    Prenatal Vit-Fe Fumarate-FA (PRENATAL MULTIVITAMIN) TABS tablet Take 1 tablet by mouth daily at 12 noon.     Allergies:   Latex   Social History   Socioeconomic History   Marital status: Married    Spouse name: Not on file   Number of children: Not on file   Years of education: Not on file   Highest education level: Not on file  Occupational History   Not on file  Tobacco Use   Smoking status: Former    Current packs/day: 0.00    Types: Cigarettes    Quit date: 03/03/2008    Years since quitting: 15.4   Smokeless tobacco: Never   Tobacco comments:    Smoked occ, no smoking for years, per pt  Vaping Use   Vaping status: Never Used  Substance and Sexual Activity   Alcohol use: Not Currently    Comment: last use 10/28/2021 "one drink"   Drug use: No   Sexual activity: Yes    Partners: Male    Birth control/protection: None    Comment: hx ocp, depo, condom  Other Topics Concern   Not on file  Social History Narrative   Not on file   Social Determinants of Health   Financial Resource Strain: Not on file  Food Insecurity: No Food Insecurity (07/04/2022)   Hunger Vital Sign    Worried About Running Out of Food in the Last Year: Never true    Ran Out of Food in the Last Year: Never true  Transportation Needs: No Transportation Needs (07/04/2022)   PRAPARE - Administrator, Civil Service (Medical): No    Lack of Transportation (Non-Medical): No  Physical Activity: Not on file  Stress: Not on file  Social Connections: Not on file      Family History  Problem Relation Age of Onset   Arthritis Mother    Hypertension Mother    Diabetes Mother    Cervical cancer Mother    Alcohol abuse Father    Arthritis Father    Hypertension Father    Arthritis Maternal Grandmother    Heart disease Maternal Grandmother    Stroke Maternal Grandmother    Hypertension Maternal Grandmother    Diabetes Maternal Grandmother    Arthritis Maternal Grandfather    Colon  cancer Maternal Grandfather    Prostate cancer Maternal Grandfather    Heart disease Maternal Grandfather    Stroke Maternal Grandfather    Hypertension Maternal Grandfather    Arthritis Paternal Grandmother    Heart disease Paternal Grandmother    Hypertension Paternal Grandmother    Diabetes Paternal  Grandmother    Arthritis Paternal Grandfather    Heart disease Paternal Grandfather    Hypertension Paternal Grandfather    Thyroid disease Maternal Aunt        thyroid cancer      ROS:   Please see the history of present illness.    Shortness of breath All other systems reviewed and are negative.   Labs/EKG Reviewed:    EKG:   EKG was ordered today.  The ekg ordered today demonstrates NSR HR 91 bpm  Recent Labs: 06/10/2023: TSH 1.160 07/08/2023: ALT 7; BUN 9; Creatinine, Ser 0.66; Hemoglobin 12.4; Platelets 257; Potassium 3.8; Sodium 139   Recent Lipid Panel No results found for: "CHOL", "TRIG", "HDL", "CHOLHDL", "LDLCALC", "LDLDIRECT"  Physical Exam:    VS:  BP 127/84 (BP Location: Left Arm, Patient Position: Sitting, Cuff Size: Large)   Pulse 91   Ht 5\' 5"  (1.651 m)   Wt (!) 328 lb 9.6 oz (149.1 kg)   LMP 04/08/2023   SpO2 98%   BMI 54.68 kg/m     Wt Readings from Last 3 Encounters:  08/16/23 (!) 328 lb 9.6 oz (149.1 kg)  07/08/23 (!) 323 lb (146.5 kg)  06/10/23 (!) 321 lb (145.6 kg)     GEN:  Well nourished, well developed in no acute distress HEENT: Normal NECK: No JVD; No carotid bruits LYMPHATICS: No lymphadenopathy CARDIAC: RRR, no murmurs, rubs, gallops RESPIRATORY:  Clear to auscultation without rales, wheezing or rhonchi  ABDOMEN: Soft, non-tender, non-distended MUSCULOSKELETAL:  No edema; No deformity  SKIN: Warm and dry NEUROLOGIC:  Alert and oriented x 3 PSYCHIATRIC:  Normal affect    Risk Assessment/Risk Calculators:     CARPREG II Risk Prediction Index Score:  1.  The patient's risk for a primary cardiac event is 5%.   Modified World  Health Organization [facility]) Classification of Maternal CV Risk   Class I         ASSESSMENT & PLAN:    Hypertension in Pregnancy Blood pressure fluctuating with activity and rest. Currently on Procardia. Experiencing shortness of breath, which may be related to blood pressure changes or potential cardiac issues. -Monitor blood pressure three times daily for one week and report results via MyChart. -Order a 7-day heart monitor to assess for potential tachycardia.  Gestational Diabetes No current issues reported. History of gestational diabetes in previous pregnancy. -Continue current management.  Preeclampsia Prevention Currently on Aspirin 162mg  daily. No history of preeclampsia, but maternal history present. -Continue Aspirin 162mg  daily. If nosebleeds occur, reduce dose to 81mg  daily.  Follow-up in 6 weeks. If any concerns arise before then, patient to contact via MyChart.    Patient Instructions  Medication Instructions:  Your physician recommends that you continue on your current medications as directed. Please refer to the Current Medication list given to you today.  *If you need a refill on your cardiac medications before your next appointment, please call your pharmacy*   Testing/Procedures: ZIO XT- Long Term Monitor Instructions  Your physician has requested you wear a ZIO patch monitor for 14 days.  This is a single patch monitor. [employer] supplies one patch monitor per enrollment. Additional stickers are not available. Please do not apply patch if you will be having a Nuclear Stress Test,  Echocardiogram, Cardiac CT, MRI, or Chest Xray during the period you would be wearing the  monitor. The patch cannot be worn during these tests. You cannot remove and re-apply the  ZIO XT patch monitor.  Your ZIO  patch monitor will be mailed 3 day USPS to your address on file. It may take 3-5 days  to receive your monitor after you have been enrolled.  Once you have received your  monitor, please review the enclosed instructions. Your monitor  has already been registered assigning a specific monitor serial # to you.  Billing and Patient Assistance Program Information  We have supplied [employer] with any of your insurance information on file for billing purposes. [employer] offers a sliding scale Patient Assistance Program for patients that do not have  insurance, or whose insurance does not completely cover the cost of the ZIO monitor.  You must apply for the Patient Assistance Program to qualify for this discounted rate.  To apply, please call [employer] at [phone], select option 4, select option 2, ask to apply for  Patient Assistance Program. Meredeth Ide will ask your household income, and how many people  are in your household. They will quote your out-of-pocket cost based on that information.  [employer] will also be able to set up a 22-month, interest-free payment plan if needed.  Applying the monitor   Shave hair from upper left chest.  Hold abrader disc by orange tab. Rub abrader in 40 strokes over the upper left chest as  indicated in your monitor instructions.  Clean area with 4 enclosed alcohol pads. Let dry.  Apply patch as indicated in monitor instructions. Patch will be placed under collarbone on left  side of chest with arrow pointing upward.  Rub patch adhesive wings for 2 minutes. Remove white label marked "1". Remove the white  label marked "2". Rub patch adhesive wings for 2 additional minutes.  While looking in a mirror, press and release button in center of patch. A small green light will  flash 3-4 times. This will be your only indicator that the monitor has been turned on.  Do not shower for the first 24 hours. You may shower after the first 24 hours.  Press the button if you feel a symptom. You will hear a small click. Record Date, Time and  Symptom in the Patient Logbook.  When you are ready to remove the patch, follow instructions on the last 2  pages of Patient  Logbook. Stick patch monitor onto the last page of Patient Logbook.  Place Patient Logbook in the blue and white box. Use locking tab on box and tape box closed  securely. The blue and white box has prepaid postage on it. Please place it in the mailbox as  soon as possible. Your physician should have your test results approximately 7 days after the  monitor has been mailed back to [facility]  Call [facility] Customer Care at [phone] if you have questions regarding  your ZIO XT patch monitor. Call them immediately if you see an orange light blinking on your  monitor.  If your monitor falls off in less than 4 days, contact our Monitor department at [phone].  If your monitor becomes loose or falls off after 4 days call [employer] at [phone] for  suggestions on securing your monitor    Follow-Up: At [facility], you and your health needs are our priority.  As part of our continuing mission to provide you with exceptional heart care, we have created designated Provider Care Teams.  These Care Teams include your primary Cardiologist (physician) and Advanced Practice Providers (APPs -  Physician Assistants and Nurse Practitioners) who all work together to provide you with the care you need, when you  need it.  We recommend signing up for the patient portal called "MyChart".  Sign up information is provided on this After Visit Summary.  MyChart is used to connect with patients for Virtual Visits (Telemedicine).  Patients are able to view lab/test results, encounter notes, upcoming appointments, etc.  Non-urgent messages can be sent to your provider as well.   To learn more about what you can do with MyChart, go to [email or web address].    Your next appointment:   6 week(s)  Provider:   Thomasene Ripple, DO [address], [place], [place] [address]   Other instructions: Please take your blood pressure daily for 1 week and send in a MyChart  message. Please include heart rates. (One message at the end of the 1 week).   HOW TO TAKE YOUR BLOOD PRESSURE: Rest 5 minutes before taking your blood pressure. Don't smoke or drink caffeinated beverages for at least 30 minutes before. Take your blood pressure before (not after) you eat. Sit comfortably with your back supported and both feet on the floor (don't cross your legs). Elevate your arm to heart level on a table or a desk. Use the proper sized cuff. It should fit smoothly and snugly around your bare upper arm. There should be enough room to slip a fingertip under the cuff. The bottom edge of the cuff should be 1 inch above the crease of the elbow. Ideally, take 3 measurements at one sitting and record the average.    Dispo:  No follow-ups on file.   Medication Adjustments/Labs and Tests Ordered: Current medicines are reviewed at length with the patient today.  Concerns regarding medicines are outlined above.  Tests Ordered: Orders Placed This Encounter  Procedures   LONG TERM MONITOR (3-14 DAYS)   EKG 12-Lead   Medication Changes: No orders of the defined types were placed in this encounter.

## 2023-08-16 NOTE — Patient Instructions (Signed)
Medication Instructions:  Your physician recommends that you continue on your current medications as directed. Please refer to the Current Medication list given to you today.  *If you need a refill on your cardiac medications before your next appointment, please call your pharmacy*   Testing/Procedures: ZIO XT- Long Term Monitor Instructions  Your physician has requested you wear a ZIO patch monitor for 14 days.  This is a single patch monitor. [employer] supplies one patch monitor per enrollment. Additional stickers are not available. Please do not apply patch if you will be having a Nuclear Stress Test,  Echocardiogram, Cardiac CT, MRI, or Chest Xray during the period you would be wearing the  monitor. The patch cannot be worn during these tests. You cannot remove and re-apply the  ZIO XT patch monitor.  Your ZIO patch monitor will be mailed 3 day USPS to your address on file. It may take 3-5 days  to receive your monitor after you have been enrolled.  Once you have received your monitor, please review the enclosed instructions. Your monitor  has already been registered assigning a specific monitor serial # to you.  Billing and Patient Assistance Program Information  We have supplied [employer] with any of your insurance information on file for billing purposes. [employer] offers a sliding scale Patient Assistance Program for patients that do not have  insurance, or whose insurance does not completely cover the cost of the ZIO monitor.  You must apply for the Patient Assistance Program to qualify for this discounted rate.  To apply, please call [employer] at [phone], select option 4, select option 2, ask to apply for  Patient Assistance Program. Meredeth Ide will ask your household income, and how many people  are in your household. They will quote your out-of-pocket cost based on that information.  [employer] will also be able to set up a 20-month, interest-free payment plan if needed.  Applying  the monitor   Shave hair from upper left chest.  Hold abrader disc by orange tab. Rub abrader in 40 strokes over the upper left chest as  indicated in your monitor instructions.  Clean area with 4 enclosed alcohol pads. Let dry.  Apply patch as indicated in monitor instructions. Patch will be placed under collarbone on left  side of chest with arrow pointing upward.  Rub patch adhesive wings for 2 minutes. Remove white label marked "1". Remove the white  label marked "2". Rub patch adhesive wings for 2 additional minutes.  While looking in a mirror, press and release button in center of patch. A small green light will  flash 3-4 times. This will be your only indicator that the monitor has been turned on.  Do not shower for the first 24 hours. You may shower after the first 24 hours.  Press the button if you feel a symptom. You will hear a small click. Record Date, Time and  Symptom in the Patient Logbook.  When you are ready to remove the patch, follow instructions on the last 2 pages of Patient  Logbook. Stick patch monitor onto the last page of Patient Logbook.  Place Patient Logbook in the blue and white box. Use locking tab on box and tape box closed  securely. The blue and white box has prepaid postage on it. Please place it in the mailbox as  soon as possible. Your physician should have your test results approximately 7 days after the  monitor has been mailed back to [facility].  Call [facility]  at [phone] if you have questions regarding  your ZIO XT patch monitor. Call them immediately if you see an orange light blinking on your  monitor.  If your monitor falls off in less than 4 days, contact our Monitor department at [phone].  If your monitor becomes loose or falls off after 4 days call [employer] at [phone] for  suggestions on securing your monitor    Follow-Up: At [facility], you and your health needs are our priority.  As part  of our continuing mission to provide you with exceptional heart care, we have created designated Provider Care Teams.  These Care Teams include your primary Cardiologist (physician) and Advanced Practice Providers (APPs -  Physician Assistants and Nurse Practitioners) who all work together to provide you with the care you need, when you need it.  We recommend signing up for the patient portal called "MyChart".  Sign up information is provided on this After Visit Summary.  MyChart is used to connect with patients for Virtual Visits (Telemedicine).  Patients are able to view lab/test results, encounter notes, upcoming appointments, etc.  Non-urgent messages can be sent to your provider as well.   To learn more about what you can do with MyChart, go to [email or web address].    Your next appointment:   6 week(s)  Provider:   Thomasene Ripple, DO [address], [place], [place] [address]   Other instructions: Please take your blood pressure daily for 1 week and send in a MyChart message. Please include heart rates. (One message at the end of the 1 week).   HOW TO TAKE YOUR BLOOD PRESSURE: Rest 5 minutes before taking your blood pressure. Don't smoke or drink caffeinated beverages for at least 30 minutes before. Take your blood pressure before (not after) you eat. Sit comfortably with your back supported and both feet on the floor (don't cross your legs). Elevate your arm to heart level on a table or a desk. Use the proper sized cuff. It should fit smoothly and snugly around your bare upper arm. There should be enough room to slip a fingertip under the cuff. The bottom edge of the cuff should be 1 inch above the crease of the elbow. Ideally, take 3 measurements at one sitting and record the average.

## 2023-08-16 NOTE — Progress Notes (Unsigned)
Enrolled patient for a 14 day Zio XT  monitor to be mailed to patients home  °

## 2023-08-27 DIAGNOSIS — I4711 Inappropriate sinus tachycardia, so stated: Secondary | ICD-10-CM

## 2023-09-06 ENCOUNTER — Ambulatory Visit: Payer: BC Managed Care – PPO | Attending: Maternal & Fetal Medicine

## 2023-09-06 ENCOUNTER — Ambulatory Visit: Payer: BC Managed Care – PPO | Admitting: *Deleted

## 2023-09-06 ENCOUNTER — Other Ambulatory Visit: Payer: Self-pay | Admitting: *Deleted

## 2023-09-06 VITALS — BP 128/81 | HR 108

## 2023-09-06 DIAGNOSIS — O24419 Gestational diabetes mellitus in pregnancy, unspecified control: Secondary | ICD-10-CM | POA: Insufficient documentation

## 2023-09-06 DIAGNOSIS — O10919 Unspecified pre-existing hypertension complicating pregnancy, unspecified trimester: Secondary | ICD-10-CM

## 2023-09-06 DIAGNOSIS — O99210 Obesity complicating pregnancy, unspecified trimester: Secondary | ICD-10-CM | POA: Insufficient documentation

## 2023-09-06 DIAGNOSIS — Z3A24 24 weeks gestation of pregnancy: Secondary | ICD-10-CM

## 2023-09-06 DIAGNOSIS — O24410 Gestational diabetes mellitus in pregnancy, diet controlled: Secondary | ICD-10-CM

## 2023-09-06 DIAGNOSIS — Z8632 Personal history of gestational diabetes: Secondary | ICD-10-CM | POA: Insufficient documentation

## 2023-09-06 DIAGNOSIS — O09299 Supervision of pregnancy with other poor reproductive or obstetric history, unspecified trimester: Secondary | ICD-10-CM | POA: Insufficient documentation

## 2023-09-06 DIAGNOSIS — O09522 Supervision of elderly multigravida, second trimester: Secondary | ICD-10-CM | POA: Insufficient documentation

## 2023-09-06 DIAGNOSIS — O24912 Unspecified diabetes mellitus in pregnancy, second trimester: Secondary | ICD-10-CM

## 2023-09-06 DIAGNOSIS — O09292 Supervision of pregnancy with other poor reproductive or obstetric history, second trimester: Secondary | ICD-10-CM

## 2023-09-06 DIAGNOSIS — O10012 Pre-existing essential hypertension complicating pregnancy, second trimester: Secondary | ICD-10-CM

## 2023-09-06 DIAGNOSIS — O10912 Unspecified pre-existing hypertension complicating pregnancy, second trimester: Secondary | ICD-10-CM | POA: Diagnosis present

## 2023-09-06 DIAGNOSIS — O0990 Supervision of high risk pregnancy, unspecified, unspecified trimester: Secondary | ICD-10-CM

## 2023-09-24 ENCOUNTER — Encounter: Payer: BC Managed Care – PPO | Admitting: Obstetrics & Gynecology

## 2023-09-25 NOTE — L&D Delivery Note (Signed)
 [facility]N [facility] Delivery Note  Jane Jackson is a 38 y.o. [id number] s/p NSVD at [redacted]w[redacted]d. She was admitted for PROM.   ROM: 62h 58m with clear fluid GBS Status: negative Maximum Maternal Temperature: 99.9  Labor Progress: Cytotec, Pitocin  Delivery Date/Time: 12/13/23 @1802   Delivery: Called to room and patient was complete and pushing. Head delivered OA. Nuchal cord x1 present, reduced. Shoulder and body delivered in usual fashion. Infant placed on mother's abdomen, dried and stimulated, spontaneous cry. Cord clamped x 2 after 1-minute delay, and cut by FOB. Cord blood drawn. NICU called to assess newborn on CPAP, see NICU provider note. Placenta delivered spontaneously with gentle cord traction. Fundus firm with massage and Pitocin. Labia, perineum, vagina, and cervix inspected. Vaginal abrasion bleeding, one stitch for hemostasis. Newborn skin to skin with mom.  Placenta: Intact to L&D Complications: Triple I Lacerations: vaginal abrasion EBL: 100 Analgesia: epidural   Infant: Girl "Adeline" APGARs 6/8  weight pending  Elige Ko, Student-MidWife  12/13/2023 6:32 PM

## 2023-10-04 ENCOUNTER — Ambulatory Visit: Payer: BC Managed Care – PPO | Admitting: Cardiology

## 2023-10-07 ENCOUNTER — Encounter: Payer: Self-pay | Admitting: *Deleted

## 2023-10-07 ENCOUNTER — Ambulatory Visit: Payer: 59 | Admitting: *Deleted

## 2023-10-07 ENCOUNTER — Ambulatory Visit: Payer: 59 | Attending: Obstetrics and Gynecology

## 2023-10-07 ENCOUNTER — Other Ambulatory Visit: Payer: Self-pay

## 2023-10-07 VITALS — BP 127/82 | HR 113

## 2023-10-07 DIAGNOSIS — O09299 Supervision of pregnancy with other poor reproductive or obstetric history, unspecified trimester: Secondary | ICD-10-CM | POA: Diagnosis present

## 2023-10-07 DIAGNOSIS — O10919 Unspecified pre-existing hypertension complicating pregnancy, unspecified trimester: Secondary | ICD-10-CM

## 2023-10-07 DIAGNOSIS — O24410 Gestational diabetes mellitus in pregnancy, diet controlled: Secondary | ICD-10-CM | POA: Diagnosis not present

## 2023-10-07 DIAGNOSIS — Z3A29 29 weeks gestation of pregnancy: Secondary | ICD-10-CM

## 2023-10-07 DIAGNOSIS — O24912 Unspecified diabetes mellitus in pregnancy, second trimester: Secondary | ICD-10-CM | POA: Diagnosis present

## 2023-10-07 DIAGNOSIS — E6609 Other obesity due to excess calories: Secondary | ICD-10-CM

## 2023-10-07 DIAGNOSIS — O0990 Supervision of high risk pregnancy, unspecified, unspecified trimester: Secondary | ICD-10-CM

## 2023-10-07 DIAGNOSIS — O10013 Pre-existing essential hypertension complicating pregnancy, third trimester: Secondary | ICD-10-CM

## 2023-10-07 DIAGNOSIS — Z8632 Personal history of gestational diabetes: Secondary | ICD-10-CM | POA: Insufficient documentation

## 2023-10-07 DIAGNOSIS — O99213 Obesity complicating pregnancy, third trimester: Secondary | ICD-10-CM

## 2023-10-07 DIAGNOSIS — O09523 Supervision of elderly multigravida, third trimester: Secondary | ICD-10-CM

## 2023-10-07 DIAGNOSIS — O3663X0 Maternal care for excessive fetal growth, third trimester, not applicable or unspecified: Secondary | ICD-10-CM

## 2023-10-07 DIAGNOSIS — O09293 Supervision of pregnancy with other poor reproductive or obstetric history, third trimester: Secondary | ICD-10-CM

## 2023-10-08 ENCOUNTER — Ambulatory Visit: Payer: Medicaid Other | Attending: Cardiology | Admitting: Cardiology

## 2023-10-08 ENCOUNTER — Other Ambulatory Visit: Payer: Self-pay | Admitting: *Deleted

## 2023-10-08 ENCOUNTER — Encounter: Payer: Self-pay | Admitting: Cardiology

## 2023-10-08 VITALS — BP 138/102 | HR 118 | Ht 65.0 in | Wt 337.0 lb

## 2023-10-08 DIAGNOSIS — I10 Essential (primary) hypertension: Secondary | ICD-10-CM | POA: Diagnosis not present

## 2023-10-08 DIAGNOSIS — Z79899 Other long term (current) drug therapy: Secondary | ICD-10-CM | POA: Diagnosis not present

## 2023-10-08 DIAGNOSIS — O10913 Unspecified pre-existing hypertension complicating pregnancy, third trimester: Secondary | ICD-10-CM

## 2023-10-08 DIAGNOSIS — O139 Gestational [pregnancy-induced] hypertension without significant proteinuria, unspecified trimester: Secondary | ICD-10-CM

## 2023-10-08 DIAGNOSIS — O24419 Gestational diabetes mellitus in pregnancy, unspecified control: Secondary | ICD-10-CM

## 2023-10-08 DIAGNOSIS — O99213 Obesity complicating pregnancy, third trimester: Secondary | ICD-10-CM

## 2023-10-08 DIAGNOSIS — I471 Supraventricular tachycardia, unspecified: Secondary | ICD-10-CM | POA: Diagnosis not present

## 2023-10-08 MED ORDER — NIFEDIPINE ER OSMOTIC RELEASE 30 MG PO TB24: 30.0000 mg | ORAL_TABLET | Freq: Two times a day (BID) | ORAL | 3 refills | Status: DC

## 2023-10-08 MED ORDER — FUROSEMIDE 40 MG PO TABS: 40.0000 mg | ORAL_TABLET | Freq: Every day | ORAL | 0 refills | Status: DC

## 2023-10-08 MED ORDER — POTASSIUM CHLORIDE CRYS ER 20 MEQ PO TBCR: 20.0000 meq | EXTENDED_RELEASE_TABLET | Freq: Every day | ORAL | 0 refills | Status: DC

## 2023-10-08 NOTE — Patient Instructions (Addendum)
 Medication Instructions:  Your physician has recommended you make the following change in your medication:  For 3 days: Lasix  40 mg once daily For 3 days: Potassium 20 mEq once daily  INCREASE: Nifedipine  30 mg twice daily (after completing Lasix ) *If you need a refill on your cardiac medications before your next appointment, please call your pharmacy*   Lab Work: CMET, Mag If you have labs (blood work) drawn today and your tests are completely normal, you will receive your results only by: MyChart Message (if you have MyChart) OR A paper copy in the mail If you have any lab test that is abnormal or we need to change your treatment, we will call you to review the results.   Follow-Up: At [facility], you and your health needs are our priority.  As part of our continuing mission to provide you with exceptional heart care, we have created designated Provider Care Teams.  These Care Teams include your primary Cardiologist (physician) and Advanced Practice Providers (APPs -  Physician Assistants and Nurse Practitioners) who all work together to provide you with the care you need, when you need it.  Your next appointment:   3-4 week(s)  Provider:   Kardie Tobb, DO     Other Instructions  Please see Pharm-D Lynden P) in 1 week.

## 2023-10-09 ENCOUNTER — Telehealth: Payer: Self-pay

## 2023-10-09 LAB — COMPREHENSIVE METABOLIC PANEL
ALT: 7 [IU]/L (ref 0–32)
AST: 10 [IU]/L (ref 0–40)
Albumin: 3.7 g/dL — ABNORMAL LOW (ref 3.9–4.9)
Alkaline Phosphatase: 101 [IU]/L (ref 44–121)
BUN/Creatinine Ratio: 12 (ref 9–23)
BUN: 7 mg/dL (ref 6–20)
Bilirubin Total: <0.2 mg/dL (ref 0.0–1.2)
CO2: 20 mmol/L (ref 20–29)
Calcium: 9.6 mg/dL (ref 8.7–10.2)
Chloride: 101 mmol/L (ref 96–106)
Creatinine, Ser: 0.6 mg/dL (ref 0.57–1.00)
Globulin, Total: 2.6 g/dL (ref 1.5–4.5)
Glucose: 107 mg/dL — ABNORMAL HIGH (ref 70–99)
Potassium: 4.1 mmol/L (ref 3.5–5.2)
Sodium: 139 mmol/L (ref 134–144)
Total Protein: 6.3 g/dL (ref 6.0–8.5)
eGFR: 117 mL/min/{1.73_m2} (ref >59)

## 2023-10-09 LAB — MAGNESIUM: Magnesium: 1.7 mg/dL (ref 1.6–2.3)

## 2023-10-09 NOTE — Telephone Encounter (Signed)
 Called pt to set up appt with Larinda Plover P. No answer, left message for her to return the call.  Pt can see Leonia Raman at [facility] on  1/24 in the afternoon.

## 2023-10-09 NOTE — Progress Notes (Signed)
 Cardio-Obstetrics Clinic  New Evaluation  Date:  10/09/2023   ID:  Jane Jackson, DOB May 03, 1986, MRN [id number]  PCP:  Jane Suzen LABOR, NP   [facility] Providers Cardiologist:  Dub Huntsman, DO  Electrophysiologist:  None       Referring MD: Jane Suzen LABOR, NP   Chief Complaint:  I am short of breath even at rest  History of Present Illness:    Jane Jackson is a 38 y.o. female [G4P2012] who is being seen today for the evaluation of shortness of breath at the request of Jane Suzen LABOR, NP.   Medical history includes inappropriate sinus tachycardia and gestational diabetes. At her last visit we discussed her concern for fluctuating blood pressure. She was also having palpitations and I placed a monitor on her.   She is experiencing intermittent elevated blood pressure. She is also noted some leg swelling.    Prior CV Studies Reviewed: The following studies were reviewed today: Prior echo  Past Medical History:  Diagnosis Date   Anxiety    Asthma    Chicken pox    Complication of anesthesia    issue with asthma and intubation   Cystic dysplasia of one kidney    about 4 months ago-no f/u- largest 5mm?   Diverticulitis    Family history of cystic fibrosis    Normal CF screen   Family history of mental retardation    Fibroid, uterine    dx a few months ago   GERD (gastroesophageal reflux disease)    Gestational diabetes    H/O mitral valve prolapse    as a child   History of mitral valve prolapse 09/16/2016   Hypertension    Impaction, bowel (HCC)    Inappropriate sinus tachycardia (HCC)    a. exacerbated by pregnancy - 2018; b. 01/2017 Holter: 29% of time in sinus tachycardia;  c. 02/2017 Echo: EF 65-70%, no rwma; c. 01/2017 Holter: 29% of time in sinus tachycardia.   Kidney stones    Upper respiratory tract infection 05/27/2020    Past Surgical History:  Procedure Laterality Date   APPENDECTOMY     CHOLECYSTECTOMY N/A 01/18/2016    Procedure: LAPAROSCOPIC CHOLECYSTECTOMY WITH INTRAOPERATIVE CHOLANGIOGRAM;  Surgeon: Morene Olives, MD;  Location: [facility];  Service: General;  Laterality: N/A;   ruptured cyst     TONSILLECTOMY AND ADENOIDECTOMY     age 86   TYMPANOSTOMY TUBE PLACEMENT        OB History     Gravida  4   Para  2   Term  2   Preterm      AB  1   Living  2      SAB  1   IAB      Ectopic      Multiple  0   Live Births  2               Current Medications: Current Meds  Medication Sig   albuterol  (VENTOLIN  HFA) 108 (90 Base) MCG/ACT inhaler Inhale 1 puff into the lungs every 6 (six) hours as needed for wheezing or shortness of breath.   aspirin  EC 81 MG tablet Take 2 tablets (162 mg total) by mouth at bedtime. Start taking when you are [redacted] weeks pregnant for rest of pregnancy for prevention of preeclampsia   furosemide  (LASIX ) 40 MG tablet Take 1 tablet (40 mg total) by mouth daily for 3 days.   NIFEdipine  (PROCARDIA -XL/NIFEDICAL-XL) 30 MG 24 hr tablet Take  1 tablet (30 mg total) by mouth in the morning and at bedtime.   ondansetron  (ZOFRAN -ODT) 4 MG disintegrating tablet Take 1 tablet (4 mg total) by mouth every 6 (six) hours as needed for nausea.   potassium chloride  SA (KLOR-CON  M20) 20 MEQ tablet Take 1 tablet (20 mEq total) by mouth daily for 3 days.   Prenatal Vit-Fe Fumarate-FA (PRENATAL MULTIVITAMIN) TABS tablet Take 1 tablet by mouth daily at 12 noon.   [DISCONTINUED] NIFEdipine  (PROCARDIA -XL/NIFEDICAL-XL) 30 MG 24 hr tablet Take 1 tablet (30 mg total) by mouth daily.     Allergies:   Latex   Social History   Socioeconomic History   Marital status: Married    Spouse name: Not on file   Number of children: Not on file   Years of education: Not on file   Highest education level: Not on file  Occupational History   Not on file  Tobacco Use   Smoking status: Former    Current packs/day: 0.00    Types: Cigarettes    Quit date: 03/03/2008    Years since quitting:  15.6   Smokeless tobacco: Never   Tobacco comments:    Smoked occ, no smoking for years, per pt  Vaping Use   Vaping status: Never Used  Substance and Sexual Activity   Alcohol use: Not Currently    Comment: last use 10/28/2021 one drink   Drug use: No   Sexual activity: Yes    Partners: Male    Birth control/protection: None    Comment: hx ocp, depo, condom  Other Topics Concern   Not on file  Social History Narrative   Not on file   Social Drivers of Health   Financial Resource Strain: Not on file  Food Insecurity: No Food Insecurity (07/04/2022)   Hunger Vital Sign    Worried About Running Out of Food in the Last Year: Never true    Ran Out of Food in the Last Year: Never true  Transportation Needs: No Transportation Needs (07/04/2022)   PRAPARE - Administrator, Civil Service (Medical): No    Lack of Transportation (Non-Medical): No  Physical Activity: Not on file  Stress: Not on file  Social Connections: Not on file      Family History  Problem Relation Age of Onset   Arthritis Mother    Hypertension Mother    Diabetes Mother    Cervical cancer Mother    Alcohol abuse Father    Arthritis Father    Hypertension Father    Arthritis Maternal Grandmother    Heart disease Maternal Grandmother    Stroke Maternal Grandmother    Hypertension Maternal Grandmother    Diabetes Maternal Grandmother    Arthritis Maternal Grandfather    Colon cancer Maternal Grandfather    Prostate cancer Maternal Grandfather    Heart disease Maternal Grandfather    Stroke Maternal Grandfather    Hypertension Maternal Grandfather    Arthritis Paternal Grandmother    Heart disease Paternal Grandmother    Hypertension Paternal Grandmother    Diabetes Paternal Grandmother    Arthritis Paternal Grandfather    Heart disease Paternal Grandfather    Hypertension Paternal Grandfather    Thyroid  disease Maternal Aunt        thyroid  cancer      ROS:   Please see the history  of present illness.    Shortness of breath All other systems reviewed and are negative.   Labs/EKG Reviewed:  EKG:   EKG was ordered today.  The ekg ordered today demonstrates NSR HR 91 bpm  Recent Labs: 06/10/2023: TSH 1.160 07/08/2023: Hemoglobin 12.4; Platelets 257 10/08/2023: ALT 7; BUN 7; Creatinine, Ser 0.60; Magnesium  1.7; Potassium 4.1; Sodium 139   Recent Lipid Panel No results found for: CHOL, TRIG, HDL, CHOLHDL, LDLCALC, LDLDIRECT  Physical Exam:    VS:  BP (!) 138/102 (BP Location: Right Arm, Patient Position: Sitting, Cuff Size: Normal)   Pulse (!) 118   Ht 5' 5 (1.651 m)   Wt (!) 337 lb (152.9 kg)   LMP 04/08/2023   SpO2 98%   BMI 56.08 kg/m     Wt Readings from Last 3 Encounters:  10/08/23 (!) 337 lb (152.9 kg)  08/16/23 (!) 328 lb 9.6 oz (149.1 kg)  07/08/23 (!) 323 lb (146.5 kg)     GEN:  Well nourished, well developed in no acute distress HEENT: Normal NECK: No JVD; No carotid bruits LYMPHATICS: No lymphadenopathy CARDIAC: RRR, no murmurs, rubs, gallops RESPIRATORY:  Clear to auscultation without rales, wheezing or rhonchi  ABDOMEN: Soft, non-tender, non-distended MUSCULOSKELETAL:  No edema; No deformity  SKIN: Warm and dry NEUROLOGIC:  Alert and oriented x 3 PSYCHIATRIC:  Normal affect    Risk Assessment/Risk Calculators:                  ASSESSMENT & PLAN:    Hypertension in Pregnancy Blood pressure still elevated will increase her nifedipine  30 mg twice daily. With the leg swelling will give her 3 days of lasix    Gestational Diabetes No current issues reported. History of gestational diabetes in previous pregnancy. -Continue current management.  Preeclampsia Prevention Currently on Aspirin  162mg  daily. No history of preeclampsia  Monitor showed rare PSVT, if blood pressure improve and still have sob will plan to treat the rare psvt.  Follow-up in 6 weeks. If any concerns arise before then, patient to contact via  MyChart.    Patient Instructions  Medication Instructions:  Your physician has recommended you make the following change in your medication:  For 3 days: Lasix  40 mg once daily For 3 days: Potassium 20 mEq once daily  INCREASE: Nifedipine  30 mg twice daily (after completing Lasix ) *If you need a refill on your cardiac medications before your next appointment, please call your pharmacy*   Lab Work: CMET, Mag If you have labs (blood work) drawn today and your tests are completely normal, you will receive your results only by: MyChart Message (if you have MyChart) OR A paper copy in the mail If you have any lab test that is abnormal or we need to change your treatment, we will call you to review the results.   Follow-Up: At [facility], you and your health needs are our priority.  As part of our continuing mission to provide you with exceptional heart care, we have created designated Provider Care Teams.  These Care Teams include your primary Cardiologist (physician) and Advanced Practice Providers (APPs -  Physician Assistants and Nurse Practitioners) who all work together to provide you with the care you need, when you need it.  Your next appointment:   3-4 week(s)  Provider:   Evadene Wardrip, DO     Other Instructions  Please see Pharm-D Lynden P) in 1 week.      Dispo:  No follow-ups on file.   Medication Adjustments/Labs and Tests Ordered: Current medicines are reviewed at length with the patient today.  Concerns regarding medicines are outlined  above.  Tests Ordered: Orders Placed This Encounter  Procedures   Comprehensive Metabolic Panel (CMET)   Magnesium    AMB Referral to [facility] Pharm-D   Medication Changes: Meds ordered this encounter  Medications   furosemide  (LASIX ) 40 MG tablet    Sig: Take 1 tablet (40 mg total) by mouth daily for 3 days.    Dispense:  3 tablet    Refill:  0   potassium chloride  SA (KLOR-CON  M20) 20 MEQ tablet    Sig: Take 1  tablet (20 mEq total) by mouth daily for 3 days.    Dispense:  3 tablet    Refill:  0   NIFEdipine  (PROCARDIA -XL/NIFEDICAL-XL) 30 MG 24 hr tablet    Sig: Take 1 tablet (30 mg total) by mouth in the morning and at bedtime.    Dispense:  180 tablet    Refill:  3

## 2023-10-10 ENCOUNTER — Ambulatory Visit (INDEPENDENT_AMBULATORY_CARE_PROVIDER_SITE_OTHER): Payer: Medicaid Other | Admitting: Obstetrics & Gynecology

## 2023-10-10 ENCOUNTER — Encounter: Payer: Self-pay | Admitting: Obstetrics & Gynecology

## 2023-10-10 VITALS — BP 138/85 | HR 62 | Wt 337.0 lb

## 2023-10-10 DIAGNOSIS — O24419 Gestational diabetes mellitus in pregnancy, unspecified control: Secondary | ICD-10-CM

## 2023-10-10 DIAGNOSIS — O10913 Unspecified pre-existing hypertension complicating pregnancy, third trimester: Secondary | ICD-10-CM

## 2023-10-10 DIAGNOSIS — O99213 Obesity complicating pregnancy, third trimester: Secondary | ICD-10-CM

## 2023-10-10 DIAGNOSIS — O0993 Supervision of high risk pregnancy, unspecified, third trimester: Secondary | ICD-10-CM

## 2023-10-10 DIAGNOSIS — O0990 Supervision of high risk pregnancy, unspecified, unspecified trimester: Secondary | ICD-10-CM

## 2023-10-10 DIAGNOSIS — Z3A29 29 weeks gestation of pregnancy: Secondary | ICD-10-CM

## 2023-10-10 DIAGNOSIS — O10919 Unspecified pre-existing hypertension complicating pregnancy, unspecified trimester: Secondary | ICD-10-CM

## 2023-10-10 NOTE — Patient Instructions (Addendum)
Return to office for any scheduled appointments. Call the office or go to the MAU at [facility] if: You begin to have strong, frequent contractions Your water breaks.  Sometimes it is a big gush of fluid, sometimes it is just a trickle that keeps getting your underwear wet or running down your legs You have vaginal bleeding.  It is normal to have a small amount of spotting if your cervix was checked.  You do not feel your baby moving like normal.  If you do not, get something to eat and drink and lay down and focus on feeling your baby move.   If your baby is still not moving like normal, you should call the office or go to MAU. Any other obstetric concerns.  TDaP Vaccine Pregnancy Get the Whooping Cough Vaccine While You Are Pregnant (CDC)  It is important for women to get the whooping cough vaccine in the third trimester of each pregnancy. Vaccines are the best way to prevent this disease. There are 2 different whooping cough vaccines. Both vaccines combine protection against whooping cough, tetanus and diphtheria, but they are for different age groups: Tdap: for everyone 11 years or older, including pregnant women  DTaP: for children 2 months through 47 years of age  You need the whooping cough vaccine during each of your pregnancies The recommended time to get the shot is during your 27th through 36th week of pregnancy, preferably during the earlier part of this time period. The Centers for Disease Control and Prevention (CDC) recommends that pregnant women receive the whooping cough vaccine for adolescents and adults (called Tdap vaccine) during the third trimester of each pregnancy. The recommended time to get the shot is during your 27th through 36th week of pregnancy, preferably during the earlier part of this time period. This replaces the original recommendation that pregnant women get the vaccine only if they had not previously received it. The [employer] and the [employer] support this recommendation.  You should get the whooping cough vaccine while pregnant to pass protection to your baby frame support disabled and/or not supported in this browser  Learn why Vernona Rieger decided to get the whooping cough vaccine in her 3rd trimester of pregnancy and how her baby girl was born with some protection against the disease. Also available on YouTube. After receiving the whooping cough vaccine, your body will create protective antibodies (proteins produced by the body to fight off diseases) and pass some of them to your baby before birth. These antibodies provide your baby some short-term protection against whooping cough in early life. These antibodies can also protect your baby from some of the more serious complications that come along with whooping cough. Your protective antibodies are at their highest about 2 weeks after getting the vaccine, but it takes time to pass them to your baby. So the preferred time to get the whooping cough vaccine is early in your third trimester. The amount of whooping cough antibodies in your body decreases over time. That is why CDC recommends you get a whooping cough vaccine during each pregnancy. Doing so allows each of your babies to get the greatest number of protective antibodies from you. This means each of your babies will get the best protection possible against this disease.  Getting the whooping cough vaccine while pregnant is better than getting the vaccine after you give birth Whooping cough vaccination during pregnancy is ideal so your  baby will have short-term protection as soon as he is born. This early protection is important because your baby will not start getting his whooping cough vaccines until he is 2 months old. These first few months of life are when your baby is at greatest risk for catching whooping cough. This is also when he's at greatest  risk for having severe, potentially life-threating complications from the infection. To avoid that gap in protection, it is best to get a whooping cough vaccine during pregnancy. You will then pass protection to your baby before he is born. To continue protecting your baby, he should get whooping cough vaccines starting at 2 months old. You may never have gotten the Tdap vaccine before and did not get it during this pregnancy. If so, you should make sure to get the vaccine immediately after you give birth, before leaving the hospital or birthing center. It will take about 2 weeks before your body develops protection (antibodies) in response to the vaccine. Once you have protection from the vaccine, you are less likely to give whooping cough to your newborn while caring for him. But remember, your baby will still be at risk for catching whooping cough from others. A recent study looked to see how effective Tdap was at preventing whooping cough in babies whose mothers got the vaccine while pregnant or in the hospital after giving birth. The study found that getting Tdap between 27 through 36 weeks of pregnancy is 85% more effective at preventing whooping cough in babies younger than 2 months old. Blood tests cannot tell if you need a whooping cough vaccine There are no blood tests that can tell you if you have enough antibodies in your body to protect yourself or your baby against whooping cough. Even if you have been sick with whooping cough in the past or previously received the vaccine, you still should get the vaccine during each pregnancy. Breastfeeding may pass some protective antibodies onto your baby By breastfeeding, you may pass some antibodies you have made in response to the vaccine to your baby. When you get a whooping cough vaccine during your pregnancy, you will have antibodies in your breast milk that you can share with your baby as soon as your milk comes in. However, your baby will not get  protective antibodies immediately if you wait to get the whooping cough vaccine until after delivering your baby. This is because it takes about 2 weeks for your body to create antibodies. Learn more about the health benefits of breastfeeding.    RSV Vaccination for Pregnant People  CDC recommends two ways to protect babies from getting very sick with Respiratory Syncytial Virus (RSV):  An RSV vaccination given during pregnancy  Pfizer's vaccine Verdis Frederickson) is recommended for use during pregnancy. It is given during RSV season to people who are 32 through [redacted] weeks pregnant.  Or, An RSV immunization given directly to infants and some older babies  Babies born to mothers who get RSV vaccine at least 2 weeks before delivery will have protection and, in most cases, should not need an RSV immunization later.    When is RSV season?  In most regions of the [place] RSV season starts in the fall and peaks in the winter, but the timing and severity of RSV season can vary from place to place and year to year.   The goal of maternal RSV vaccination is to protect babies from getting very sick with RSV during their first RSV season.  In most of the [place], this means maternal RSV vaccine will be given in September through January.  Who should get the maternal RSV vaccine?  People who are 45 through [redacted] weeks pregnant during September through January should get one dose of maternal RSV vaccine to protect their babies. RSV season can vary around the country.   How is the maternal RSV vaccine administered?  Maternal RSV vaccine is given as a shot into the mother's upper arm. Only a single dose (one shot) of maternal RSV vaccine is recommended.   It is not yet known whether another dose might be needed in later pregnancies.  How well does the maternal RSV vaccine work?  When someone gets RSV vaccine, their body responds by making a protein that protects against the virus that  causes RSV. The process takes about 2 weeks. When a pregnant person gets RSV vaccine, their protective proteins (called antibodies) also pass to their baby. So, babies who are born at least 2 weeks after their mother gets RSV vaccine are protected at birth, when infants are at the highest risk of severe RSV disease.   The vaccine can reduce a baby's risk of being hospitalized from RSV by 57% in the first six months after birth.  What are the possible side effects of the maternal RSV vaccine?  In the clinical trials, the side effects most often reported by pregnant people who received the maternal RSV vaccine were pain at the injection site, headache, muscle pain, and nausea.  Although not common, a dangerous high blood pressure condition called pre-eclampsia occurred in 1.8% of pregnant people who received the maternal RSV vaccine compared to 1.4% of pregnant people who received a placebo.  The clinical trials identified a small increase in the number of preterm births in vaccinated pregnant people. It is not clear if this is a true safety problem related to RSV vaccine or if this occurred for reasons unrelated to vaccination.  To reduce the potential risk of preterm birth and complications from RSV disease, FDA approved the maternal RSV vaccine for use during weeks 32 through 19 of pregnancy while additional studies are conducted.  FDA is requiring the manufacturer to do additional studies that will look more closely at the potential risk of preterm births and pregnancy-related high blood pressure issues in mothers, including pre-eclampsia.  Severe allergic reactions to vaccines are rare but can happen after any vaccine and can be life-threatening. If you see signs of a severe allergic reaction after vaccination (hives, swelling of the face and throat, difficulty breathing, a fast heartbeat, dizziness, or weakness), seek immediate medical care by calling 911.  As with any medicine or vaccine there  is a very remote chance of the vaccine causing other serious injury or death after vaccination.  Adverse events following vaccination should be reported to the Vaccine Adverse Event Reporting System (VAERS), even if it's not clear that the vaccine caused the adverse event. You or your doctor can report an adverse event to [facility] and FDA through VAERS. If you need further assistance reporting to VAERS, please email [email or web address] or call [phone].  If you have any questions about side effects from the maternal RSV vaccine, talk with your healthcare provider.  Do I need a prescription for a maternal RSV vaccine?  Until the vaccine available in the office, you will need a prescription to take to a local pharmacy that is providing the vaccine.   How do I pay for the maternal RSV vaccine?  Most private health insurance plans cover the maternal RSV vaccine, but there may be a cost to you depending on your plan.  Contact your insurer to find out.  Medicaid Beginning June 24, 2022, most people with coverage from [facility] and [employer] [facility]) will be guaranteed coverage of all vaccines recommended by the Advisory Committee on Immunization Practice at no cost to them.   Source: [facility] for Immunization and Respiratory Diseases

## 2023-10-10 NOTE — Progress Notes (Signed)
PRENATAL VISIT NOTE  Subjective:  Jane Jackson is a 38 y.o. [id number] at [redacted]w[redacted]d being seen today for ongoing prenatal care.  She is currently monitored for the following issues for this high-risk pregnancy and has Maternal morbid obesity, antepartum (HCC); Uncomplicated asthma; Prediabetes; Supervision of high risk pregnancy, antepartum; Preexisting hypertension complicating pregnancy, antepartum; and Gestational diabetes mellitus, antepartum on their problem list.  Patient reports  feeling tired and worn out, otherwise no complaints .  Contractions: Regular.  .  Movement: Present. Denies leaking of fluid.   The following portions of the patient's history were reviewed and updated as appropriate: allergies, current medications, past family history, past medical history, past social history, past surgical history and problem list.   Objective:   Vitals:   10/10/23 1505  BP: 138/85  Pulse: 62  Weight: (!) 337 lb (152.9 kg)    Fetal Status: Fetal Heart Rate (bpm): 142   Movement: Present     General:  Alert, oriented and cooperative. Patient is in no acute distress.  Skin: Skin is warm and dry. No rash noted.   Cardiovascular: Normal heart rate noted  Respiratory: Normal respiratory effort, no problems with respiration noted  Abdomen: Soft, gravid, appropriate for gestational age.  Pain/Pressure: Absent     Pelvic: Cervical exam deferred        Extremities: Normal range of motion.  Edema: Trace  Mental Status: Normal mood and affect. Normal behavior. Normal judgment and thought content.    [place] MFM OB FOLLOW UP Result Date: 10/07/2023 ----------------------------------------------------------------------  OBSTETRICS REPORT                       (Signed Final 10/07/2023 04:37 pm) ---------------------------------------------------------------------- Patient Info  ID #:       [id number]                          D.O.B.:  11/05/1985 (37 yrs)(F)  Name:       Jane Jackson                  Visit  Date: 10/07/2023 03:30 pm ---------------------------------------------------------------------- Performed By  Attending:        Ma Rings MD         Ref. Address:     [address]                                                             [address]  Performed By:     Emeline Darling BS,      [facility]  Referred By:      [facility] ---------------------------------------------------------------------- Orders  #  Description                           Code        Ordered By  1  [place] MFM OB FOLLOW UP                   [phone]    Noralee Space ----------------------------------------------------------------------  #  Order #                     Accession #                Episode #  1  [id number]                   [id number]                 [id number] ---------------------------------------------------------------------- Indications  Hypertension - Chronic/Pre-existing            O10.019  (procardia)  Gestational diabetes in pregnancy, diet        O24.410  controlled  Maternal morbid obesity (PG BMI 55.24)         O99.210 E66.01  Large for gestational age fetus affecting      O36.60X0  management of mother  Poor obstetric history: Previous gestational   O49.299  diabetes  LR female, neg horizon,  Advanced maternal age multigravida 64+,        O63.523  third trimester (52)  [redacted] weeks gestation of pregnancy                Z3A.29 ---------------------------------------------------------------------- Vital Signs  BP:          127/82 ---------------------------------------------------------------------- Fetal Evaluation  Num Of Fetuses:         1  Fetal Heart Rate(bpm):  150  Cardiac Activity:       Observed  Presentation:           Transverse, head to maternal left  Placenta:                Anterior  P. Cord Insertion:      Previously seen  Amniotic Fluid  AFI FV:      Within normal limits  AFI Sum(cm)     %Tile       Largest Pocket(cm)  16.29           59          5.16  RUQ(cm)       RLQ(cm)       LUQ(cm)        LLQ(cm)  3.82          5.16          2.68           4.63 ---------------------------------------------------------------------- Biometry  BPD:      75.8  mm     G. Age:  30w 3d         80  %    CI:  74.94   %    70 - 86                                                          FL/HC:      21.0   %    19.6 - 20.8  HC:      277.8  mm     G. Age:  30w 3d         59  %    HC/AC:      1.02        0.99 - 1.21  AC:      272.5  mm     G. Age:  31w 2d         95  %    FL/BPD:     76.9   %    71 - 87  FL:       58.3  mm     G. Age:  30w 3d         76  %    FL/AC:      21.4   %    20 - 24  Est. FW:    1660  gm    3 lb 11 oz      95  % ---------------------------------------------------------------------- OB History  Gravidity:    4         Term:   2        Prem:   0        SAB:   1  TOP:          0       Ectopic:  0        Living: 2 ---------------------------------------------------------------------- Gestational Age  LMP:           26w 0d        Date:  04/08/23                  EDD:   01/13/24  U/S Today:     30w 5d                                        EDD:   12/11/23  Best:          29w 0d     Det. By:  U/S C R L  (05/29/23)    EDD:   12/23/23 ---------------------------------------------------------------------- Anatomy  Diaphragm:             Appears normal         Kidneys:                Appear normal  Stomach:               Appears normal, left   Bladder:                Appears normal                         sided ---------------------------------------------------------------------- Cervix Uterus Adnexa  Cervix  Not visualized (advanced GA >24wks) ---------------------------------------------------------------------- Comments  This patient was seen for a follow up growth scan due to   maternal obesity  with a BMI of 55, chronic hypertension  treated with nifedipine, and diet-controlled gestational  diabetes.  She denies any problems since her last exam.  She was informed that the fetal growth measures large for  her gestational age (95th percentile).  There was normal  amniotic fluid noted.  Due to her underlying medical complications, we will start  weekly fetal testing at 32 weeks.  She will return in 3 weeks for a BPP. ----------------------------------------------------------------------                   Ma Rings, MD Electronically Signed Final Report   10/07/2023 04:37 pm ----------------------------------------------------------------------   LONG TERM MONITOR (3-14 DAYS) Result Date: 09/27/2023 Patch Wear Time:  13 days and 7 hours (2024-12-03T17:17:45-0500 to 2024-12-17T00:34:44-0500) Patient had a min HR of 49 bpm, max HR of 164 bpm, and avg HR of 87 bpm. Predominant underlying rhythm was Sinus Rhythm. 2 Supraventricular Tachycardia runs occurred, the run with the fastest interval lasting 4 beats with a max rate of 121 bpm, the longest lasting 4 beats with an avg rate of 110 bpm. Isolated SVEs were rare (<1.0%), SVE Couplets were rare (<1.0%), and SVE Triplets were rare (<1.0%). Isolated VEs were rare (<1.0%), and no VE Couplets or VE Triplets were present. Symptoms associated with sinus rhythm and sinus tachycardia. 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    Assessment and Plan:  Pregnancy: [id number] at [redacted]w[redacted]d 1. Preexisting hypertension complicating pregnancy, antepartum (Primary) Stable BP, followed by Cardiology. Continue Procardia. Scans ans antenatal testing scheduled as per MFM.  2. Gestational diabetes mellitus (GDM) in third trimester, gestational diabetes method of control unspecified Not checking CBGs frequently, reports mostly in range. Approved for Dexcom. Recommended A1C check, she deferred to next  visit.  3. Maternal morbid obesity, antepartum (HCC) TWG 15 lb, doing well.  4. [redacted] weeks gestation of pregnancy 5. Supervision of high risk pregnancy, antepartum No other concerns. Preterm labor symptoms and general obstetric precautions including but not limited to vaginal bleeding, contractions, leaking of fluid and fetal movement were reviewed in detail with the patient. Please refer to After Visit Summary for other counseling recommendations.   Return in about 2 weeks (around 10/24/2023) for OFFICE OB VISIT (MD only).  Future Appointments  Date Time Provider Department Center  10/18/2023  4:00 PM Cheree Ditto, [facility] None  10/25/2023  2:15 PM [facility] NURSE [facility]  10/25/2023  2:30 PM [facility] US1 [facility]  10/30/2023  3:20 PM Tobb, Kardie, DO [facility] None  11/01/2023  1:15 PM [facility] NURSE [facility]  11/01/2023  1:30 PM [facility] US1 [facility]  11/08/2023  1:15 PM [facility] NURSE [facility]  11/08/2023  1:30 PM [facility] US2 [facility]  11/15/2023  1:15 PM [facility] NURSE [facility]  11/15/2023  1:30 PM [facility] US2 [facility]  11/22/2023  1:15 PM [facility] NURSE [facility]  11/22/2023  1:30 PM [facility] US2 [facility]  11/29/2023  1:15 PM [facility] NURSE [facility]  11/29/2023  1:30 PM [facility] US2 [facility]    Jaynie Collins, MD

## 2023-10-18 ENCOUNTER — Ambulatory Visit (INDEPENDENT_AMBULATORY_CARE_PROVIDER_SITE_OTHER): Payer: Medicaid Other | Admitting: Pharmacist

## 2023-10-18 ENCOUNTER — Encounter: Payer: Self-pay | Admitting: Pharmacist

## 2023-10-18 ENCOUNTER — Other Ambulatory Visit (HOSPITAL_COMMUNITY): Payer: Self-pay

## 2023-10-18 VITALS — BP 114/70 | HR 99 | Wt 339.0 lb

## 2023-10-18 DIAGNOSIS — I10 Essential (primary) hypertension: Secondary | ICD-10-CM | POA: Diagnosis not present

## 2023-10-18 MED ORDER — FUROSEMIDE 40 MG PO TABS: 40.0000 mg | ORAL_TABLET | Freq: Every day | ORAL | 0 refills | Status: DC

## 2023-10-18 MED ORDER — LABETALOL HCL 200 MG PO TABS: 200.0000 mg | ORAL_TABLET | Freq: Two times a day (BID) | ORAL | 5 refills | Status: DC

## 2023-10-18 MED ORDER — POTASSIUM CHLORIDE CRYS ER 20 MEQ PO TBCR: 20.0000 meq | EXTENDED_RELEASE_TABLET | Freq: Every day | ORAL | 0 refills | Status: DC

## 2023-10-18 NOTE — Patient Instructions (Addendum)
It was nice meeting you today  You can discontinue your nifedipine at this time  We will start a new blood pressure medication called labetalol 200mg  twice daily  To help with your fluid we will refill your furosemide and potassium for 3 days  Please send over your blood pressure readings via mychart  Laural Golden, PharmD, BCACP, CDCES, CPP [address] [place], [place], [address] Phone: [phone], Fax: [phone]

## 2023-10-18 NOTE — Progress Notes (Signed)
Patient ID: Jane Jackson                 DOB: 03/30/1986                      MRN: [id number]     HPI: Jane Jackson is a 38 y.o. female referred by to cardio OB clinic by Dr Macon Large.  PMH is significant for GDM, HTN, and obesity.  Seen by Dr Servando Salina on 1/14 and was hypertensive. Nifedipine was increased to 30 mg BID.  Patient presents today for HTN follow up. Reports home readings between 130-140/90s-100s.   Lower extremities very swollen, patient uncomfortable. Reports had relief with three days of lasix. Reports swelling started when she started nifedipine. Also reports a constant dull headache.  Is not checking blood sugar at home. Is waiting for CGM to be approved by insurance.  Works 2 jobs and does not get home until [employer]. Estimates she only gets about 4 hours of sleep per night.  Home readings from phone: 124/90 154/99 122/86 144/66   Wt Readings from Last 3 Encounters:  10/10/23 (!) 337 lb (152.9 kg)  10/08/23 (!) 337 lb (152.9 kg)  08/16/23 (!) 328 lb 9.6 oz (149.1 kg)   BP Readings from Last 3 Encounters:  10/10/23 138/85  10/08/23 (!) 138/102  10/07/23 127/82   Pulse Readings from Last 3 Encounters:  10/10/23 62  10/08/23 (!) 118  10/07/23 (!) 113    Renal function: Estimated Creatinine Clearance: 142.2 mL/min (by C-G formula based on SCr of 0.6 mg/dL).  Past Medical History:  Diagnosis Date   Anxiety    Asthma    Chicken pox    Complication of anesthesia    issue with asthma and intubation   Cystic dysplasia of one kidney    about 4 months ago-no f/u- largest 5mm?   Diverticulitis    Family history of cystic fibrosis    Normal CF screen   Family history of mental retardation    Fibroid, uterine    dx a few months ago   GERD (gastroesophageal reflux disease)    Gestational diabetes    H/O mitral valve prolapse    as a child   History of mitral valve prolapse 09/16/2016   Hypertension    Impaction, bowel (HCC)    Inappropriate sinus tachycardia  (HCC)    a. exacerbated by pregnancy - 2018; b. 01/2017 Holter: 29% of time in sinus tachycardia;  c. 02/2017 Echo: EF 65-70%, no rwma; c. 01/2017 Holter: 29% of time in sinus tachycardia.   Kidney stones    Upper respiratory tract infection 05/27/2020    Current Outpatient Medications on File Prior to Visit  Medication Sig Dispense Refill   albuterol (VENTOLIN HFA) 108 (90 Base) MCG/ACT inhaler Inhale 1 puff into the lungs every 6 (six) hours as needed for wheezing or shortness of breath.     aspirin EC 81 MG tablet Take 2 tablets (162 mg total) by mouth at bedtime. Start taking when you are [redacted] weeks pregnant for rest of pregnancy for prevention of preeclampsia 300 tablet 2   Continuous Glucose Sensor (DEXCOM G6 SENSOR) MISC 1 Package by Does not apply route every 30 (thirty) days. (Patient not taking: Reported on 10/10/2023) 3 each 6   Continuous Glucose Transmitter (DEXCOM G6 TRANSMITTER) MISC 1 Package by Does not apply route every 3 (three) months. (Patient not taking: Reported on 10/10/2023) 1 each 2   furosemide (LASIX) 40 MG tablet Take  1 tablet (40 mg total) by mouth daily for 3 days. 3 tablet 0   NIFEdipine (PROCARDIA-XL/NIFEDICAL-XL) 30 MG 24 hr tablet Take 1 tablet (30 mg total) by mouth in the morning and at bedtime. 180 tablet 3   ondansetron (ZOFRAN-ODT) 4 MG disintegrating tablet Take 1 tablet (4 mg total) by mouth every 6 (six) hours as needed for nausea. 20 tablet 0   potassium chloride SA (KLOR-CON M20) 20 MEQ tablet Take 1 tablet (20 mEq total) by mouth daily for 3 days. 3 tablet 0   Prenatal Vit-Fe Fumarate-FA (PRENATAL MULTIVITAMIN) TABS tablet Take 1 tablet by mouth daily at 12 noon.     No current facility-administered medications on file prior to visit.    Allergies  Allergen Reactions   Latex Itching and Rash     Assessment/Plan:  1. Hypertension -  Patient BP in room well controlled today at 114/70. Unfortunately is not tolerating nifedipine well. Could be cause of  swelling and headache. Will d/c at this time and switch to labetalol. Will also refill her 3 days of lasix and potassium. Instructed patient to continue to monitor BP over weekend and send results back to me via mychart on Monday to formulate plan. Patient voiced understanding.  D/C nifedipine Start labetalol 200mg  BID Refill furosemide/potassium x 3 days  Laural Golden, PharmD, BCACP, CDCES, CPP [address], [address] [place], [place], [address] Phone: [phone], Fax: [phone]

## 2023-10-24 ENCOUNTER — Encounter: Payer: Self-pay | Admitting: *Deleted

## 2023-10-25 ENCOUNTER — Ambulatory Visit (HOSPITAL_BASED_OUTPATIENT_CLINIC_OR_DEPARTMENT_OTHER): Payer: 59 | Admitting: Maternal & Fetal Medicine

## 2023-10-25 ENCOUNTER — Ambulatory Visit: Payer: 59 | Admitting: *Deleted

## 2023-10-25 ENCOUNTER — Ambulatory Visit: Payer: 59 | Attending: Obstetrics

## 2023-10-25 VITALS — BP 138/93 | HR 89

## 2023-10-25 DIAGNOSIS — O24419 Gestational diabetes mellitus in pregnancy, unspecified control: Secondary | ICD-10-CM | POA: Insufficient documentation

## 2023-10-25 DIAGNOSIS — O10013 Pre-existing essential hypertension complicating pregnancy, third trimester: Secondary | ICD-10-CM

## 2023-10-25 DIAGNOSIS — O09293 Supervision of pregnancy with other poor reproductive or obstetric history, third trimester: Secondary | ICD-10-CM

## 2023-10-25 DIAGNOSIS — O0990 Supervision of high risk pregnancy, unspecified, unspecified trimester: Secondary | ICD-10-CM | POA: Insufficient documentation

## 2023-10-25 DIAGNOSIS — O24410 Gestational diabetes mellitus in pregnancy, diet controlled: Secondary | ICD-10-CM

## 2023-10-25 DIAGNOSIS — O09523 Supervision of elderly multigravida, third trimester: Secondary | ICD-10-CM

## 2023-10-25 DIAGNOSIS — O10919 Unspecified pre-existing hypertension complicating pregnancy, unspecified trimester: Secondary | ICD-10-CM | POA: Diagnosis present

## 2023-10-25 DIAGNOSIS — Z3A31 31 weeks gestation of pregnancy: Secondary | ICD-10-CM

## 2023-10-25 DIAGNOSIS — O99213 Obesity complicating pregnancy, third trimester: Secondary | ICD-10-CM | POA: Insufficient documentation

## 2023-10-25 DIAGNOSIS — O10913 Unspecified pre-existing hypertension complicating pregnancy, third trimester: Secondary | ICD-10-CM | POA: Insufficient documentation

## 2023-10-25 DIAGNOSIS — O3660X0 Maternal care for excessive fetal growth, unspecified trimester, not applicable or unspecified: Secondary | ICD-10-CM

## 2023-10-25 NOTE — Progress Notes (Signed)
Patient information  Patient Name: Jane Jackson  Patient MRN:   [id number]  Referring practice: MFM Referring Provider: [facility]  MFM CONSULT  Ikia BRAU is a 38 y.o. [id number] at [redacted]w[redacted]d here for ultrasound and consultation. Patient Active Problem List   Diagnosis Date Noted   Gestational diabetes mellitus, antepartum 07/08/2023   Preexisting hypertension complicating pregnancy, antepartum 06/10/2023   Supervision of high risk pregnancy, antepartum 05/29/2023   Prediabetes 01/16/2022   Uncomplicated asthma 09/16/2016   Maternal morbid obesity, antepartum (HCC) 01/14/2016    Alvia Cripps is doing well today with no acute concerns. She denies contractions, bleeding, or loss of fluid and reports good fetal movement.   RE gestational diabetes, diet controlled: Patient reports that she rarely checks her fasting blood sugar due to a very busy work schedule.  She is working nearly 17 hours a day.  She reports that she will attempt to have a consistent week of checking fasting blood sugars.  She reports the last time she checked it was about 120.  I discussed that this is out of range for her goal and this could explain the fetal macrosomia.  I discussed the concern for intrauterine fetal hypoxia/stillbirth and that this risk can be reduced with proper glycemic control.  I also discussed that her postprandial blood sugars are all very good most of less than 120.  I discussed that starting NPH 6 units at night could help with her fasting blood sugar and decrease the fetal size and decrease the risk of adverse neonatal outcomes.  RE chronic hypertension on medication: Blood pressure is 183/93 today.  She denies any signs or symptoms of preeclampsia.  She is compliant with her medication.  Sonographic findings Single intrauterine pregnancy. Fetal cardiac activity: Observed. Presentation: Breech. Interval fetal anatomy appears normal. Amniotic fluid: Subjectively  upper-normal.  MVP: 6.34 cm. Placenta: Anterior. BPP: 8/8.   There are limitations of prenatal ultrasound such as the inability to detect certain abnormalities due to poor visualization. Various factors such as fetal position, gestational age and maternal body habitus may increase the difficulty in visualizing the fetal anatomy.    Recommendations -Continue weekly antenatal testing -Growth ultrasounds every 4 weeks -Consider 6 units of NPH to be given at night prior to sleeping to target fasting blood sugar if they remain persistently elevated over 95 -Delivery timing likely around 37 to 38 weeks  Review of Systems: A review of systems was performed and was negative except per HPI   Vitals and Physical Exam    10/25/2023    3:04 PM 10/25/2023    2:19 PM 10/18/2023    4:07 PM  Vitals with BMI  Weight   339 lbs  BMI   56.41  Systolic 133 138 010  Diastolic 79 93 70  Pulse 96 89 99    Sitting comfortably on the sonogram table Nonlabored breathing Normal rate and rhythm Abdomen is nontender  Past pregnancies OB History  Gravida Para Term Preterm AB Living  4 2 2  1 2   SAB IAB Ectopic Multiple Live Births  1   0 2    # Outcome Date GA Lbr Len/2nd Weight Sex Type Anes PTL Lv  4 Current           3 SAB 02/2023          2 Term 07/06/22 [redacted]w[redacted]d / 168:15 7 lb 0.2 oz (3.18 kg) M Vag-Vacuum EPI  LIV     Birth Comments: WDL  1 Term 03/13/17 [redacted]w[redacted]d 24:30 / 00:47 6 lb 4.5 oz (2.85 kg) M Vag-Spont EPI  LIV     I spent 20 minutes reviewing the patients chart, including labs and images as well as counseling the patient about her medical conditions. Greater than 50% of the time was spent in direct face-to-face patient counseling.  Braxton Feathers  MFM, [facility]   10/25/2023  3:37 PM

## 2023-10-28 ENCOUNTER — Ambulatory Visit (INDEPENDENT_AMBULATORY_CARE_PROVIDER_SITE_OTHER): Payer: Medicaid Other | Admitting: Obstetrics and Gynecology

## 2023-10-28 ENCOUNTER — Encounter: Payer: Self-pay | Admitting: Obstetrics and Gynecology

## 2023-10-28 VITALS — BP 123/81 | HR 93 | Wt 340.0 lb

## 2023-10-28 DIAGNOSIS — O24419 Gestational diabetes mellitus in pregnancy, unspecified control: Secondary | ICD-10-CM

## 2023-10-28 DIAGNOSIS — O3663X0 Maternal care for excessive fetal growth, third trimester, not applicable or unspecified: Secondary | ICD-10-CM | POA: Insufficient documentation

## 2023-10-28 DIAGNOSIS — I471 Supraventricular tachycardia, unspecified: Secondary | ICD-10-CM | POA: Insufficient documentation

## 2023-10-28 DIAGNOSIS — Z3A32 32 weeks gestation of pregnancy: Secondary | ICD-10-CM

## 2023-10-28 DIAGNOSIS — O0990 Supervision of high risk pregnancy, unspecified, unspecified trimester: Secondary | ICD-10-CM

## 2023-10-28 DIAGNOSIS — Z6841 Body Mass Index (BMI) 40.0 and over, adult: Secondary | ICD-10-CM

## 2023-10-28 DIAGNOSIS — R0683 Snoring: Secondary | ICD-10-CM

## 2023-10-28 DIAGNOSIS — O10919 Unspecified pre-existing hypertension complicating pregnancy, unspecified trimester: Secondary | ICD-10-CM

## 2023-10-28 MED ORDER — PEN NEEDLES 33G X 4 MM MISC: 5 refills | Status: DC

## 2023-10-28 MED ORDER — INSULIN GLARGINE 100 UNIT/ML SOLOSTAR PEN: 6.0000 [IU] | PEN_INJECTOR | Freq: Every day | SUBCUTANEOUS | 1 refills | Status: DC

## 2023-10-28 NOTE — Progress Notes (Signed)
ROB   CC: Puffiness all over.

## 2023-10-28 NOTE — Progress Notes (Addendum)
PRENATAL VISIT NOTE  Subjective:  Jane Jackson is a 38 y.o. [id number] at [redacted]w[redacted]d being seen today for ongoing prenatal care.  She is currently monitored for the following issues for this high-risk pregnancy and has Maternal morbid obesity, antepartum (HCC); Uncomplicated asthma; Prediabetes; Supervision of high risk pregnancy, antepartum; Preexisting hypertension complicating pregnancy, antepartum; Gestational diabetes mellitus, antepartum; Excessive fetal growth affecting management of mother in third trimester, antepartum; and PSVT (paroxysmal supraventricular tachycardia) (HCC) on their problem list.  Patient reports  swelling . More pressure/aching. Contractions: Irritability. Vag. Bleeding: None.  Movement: Present. Denies leaking of fluid.   The following portions of the patient's history were reviewed and updated as appropriate: allergies, current medications, past family history, past medical history, past social history, past surgical history and problem list.   Objective:   Vitals:   10/28/23 1551  BP: 123/81  Pulse: 93  Weight: (!) 340 lb (154.2 kg)    Fetal Status: Fetal Heart Rate (bpm): 154   Movement: Present     General:  Alert, oriented and cooperative. Patient is in no acute distress.  Skin: Skin is warm and dry. No rash noted.   Cardiovascular: Normal heart rate noted  Respiratory: Normal respiratory effort, no problems with respiration noted  Abdomen: Soft, gravid, appropriate for gestational age.  Pain/Pressure: Present      Assessment and Plan:  Pregnancy: [id number] at [redacted]w[redacted]d 1. Supervision of high risk pregnancy, antepartum (Primary) 2. [redacted] weeks gestation of pregnancy  3. Preexisting hypertension complicating pregnancy, antepartum Switched from procardia to labetalol 200mg  BID by cardiology last week. BP today normotensive (123/81) Notes feeling sluggish since starting labetalol; offered transition back to nifedipine which she declines d/t HA, persistently  elevated BP while takin git Continue ldASA Weekly antenatal testing scheduled Anticipate delivery at 37-38wk pending BG/BP control  4. Gestational diabetes mellitus (GDM), antepartum, gestational diabetes method of control unspecified 7. LGA fetus Dexcom transmitter was denied. When she's checked sugars, a 6h fasting was 120, has been in the 150s during the day Discussed adding insulin vs metformin. Discussed insulin is generally preferred in pregnancy because we can titrate to sugars, but she is not check sugars frequently so we need to be careful with prescribing insulin. Discussed option for metformin which could help globally decrease sugars. She would prefer insulin - START lantus 6u at bedtime Reviewed si/sx of lows, need to monitor fasting sugars and possibility of adding insulin in the AM @29 /0 1660g (95%), AC 95%, trv, ant, 16.29 Antenatal testing as above Anticipate delivery at 37-38wk pending BG/BP control  5. BMI 50.0-59.9, adult (HCC)  6. PSVT Dx w/ zio patch this pregnancy F/w Dr. Servando Salina, no current medications. Next appt 2/5  7. Snoring Reports frequent snoring, waking up gasping multiple times in the night, and sleepiness Referral to sleep medicine sent  Please refer to After Visit Summary for other counseling recommendations.   Return in about 2 weeks (around 11/11/2023).  Future Appointments  Date Time Provider Department Center  10/30/2023  3:20 PM Thomasene Ripple, DO [facility] None  11/01/2023  1:15 PM [facility] NURSE [facility]  11/01/2023  1:30 PM [facility] US1 [facility]  11/08/2023  1:15 PM [facility] NURSE [facility]  11/08/2023  1:30 PM [facility] US2 [facility]  11/11/2023  3:30 PM [place] Bing, MD [facility]  11/15/2023  1:15 PM [facility] NURSE [facility]  11/15/2023  1:30 PM [facility] US2 [facility]  11/22/2023  1:15 PM [facility] NURSE [facility]  11/22/2023  1:30 PM  [facility] US2 [facility]  11/25/2023  3:30 PM Anyanwu, Jethro Bastos, MD [facility]  [facility]  11/29/2023  1:15 PM [facility] NURSE [facility]  11/29/2023  1:30 PM [facility] US2 [facility]  12/02/2023  3:30 PM Federico Flake, MD [facility]  12/09/2023  3:30 PM Federico Flake, MD [facility]  12/16/2023  3:30 PM [place] Bing, MD [facility]   Lennart Pall, MD

## 2023-10-30 ENCOUNTER — Ambulatory Visit: Payer: 59 | Attending: Cardiology | Admitting: Cardiology

## 2023-10-30 ENCOUNTER — Encounter: Payer: Self-pay | Admitting: Cardiology

## 2023-10-30 VITALS — BP 130/94 | HR 90 | Ht 65.0 in | Wt 342.8 lb

## 2023-10-30 DIAGNOSIS — O99210 Obesity complicating pregnancy, unspecified trimester: Secondary | ICD-10-CM

## 2023-10-30 DIAGNOSIS — I10 Essential (primary) hypertension: Secondary | ICD-10-CM

## 2023-10-30 DIAGNOSIS — I471 Supraventricular tachycardia, unspecified: Secondary | ICD-10-CM

## 2023-10-30 DIAGNOSIS — Z3A32 32 weeks gestation of pregnancy: Secondary | ICD-10-CM

## 2023-10-30 MED ORDER — LABETALOL HCL 200 MG PO TABS: 200.0000 mg | ORAL_TABLET | Freq: Three times a day (TID) | ORAL | 3 refills | Status: DC

## 2023-10-30 NOTE — Patient Instructions (Addendum)
 Medication Instructions:  Your physician has recommended you make the following change in your medication:  INCREASE: Labetalol  200 mg three times daily *If you need a refill on your cardiac medications before your next appointment, please call your pharmacy*   Follow-Up: At [facility], you and your health needs are our priority.  As part of our continuing mission to provide you with exceptional heart care, we have created designated Provider Care Teams.  These Care Teams include your primary Cardiologist (physician) and Advanced Practice Providers (APPs -  Physician Assistants and Nurse Practitioners) who all work together to provide you with the care you need, when you need it.  Your next appointment:   3-4 week(s)  Provider:   Kardie Tobb, DO     Other Instructions: Please keep your appointment with [facility] Monday 2/17 at 11:30am

## 2023-10-30 NOTE — Progress Notes (Signed)
 Cardio-Obstetrics Clinic  New Evaluation  Date:  10/30/2023   ID:  Jane Jackson, DOB 1986-05-02, MRN [id number]  PCP:  Moishe Suzen LABOR, NP   [facility] Providers Cardiologist:  Dub Huntsman, DO  Electrophysiologist:  None       Referring MD: Moishe Suzen LABOR, NP   Chief Complaint:  I am short of breath even at rest  History of Present Illness:    Jane Jackson is a 38 y.o. female [G4P2012] who is being seen today for the evaluation of shortness of breath at the request of Moishe Suzen LABOR, NP.   Medical history includes inappropriate sinus tachycardia and gestational diabetes.  At the time of her visit she was experiencing significant leg swelling I gave the patient 3 days worth of Lasix .  She tells me that helped greatly.  We discussed her monitor result which show evidence of PSVT.  Prior CV Studies Reviewed: The following studies were reviewed today: Prior echo  Past Medical History:  Diagnosis Date   Anxiety    Asthma    Chicken pox    Complication of anesthesia    issue with asthma and intubation   Cystic dysplasia of one kidney    about 4 months ago-no f/u- largest 5mm?   Diverticulitis    Family history of cystic fibrosis    Normal CF screen   Family history of mental retardation    Fibroid, uterine    dx a few months ago   GERD (gastroesophageal reflux disease)    Gestational diabetes    H/O mitral valve prolapse    as a child   History of mitral valve prolapse 09/16/2016   Hypertension    Impaction, bowel (HCC)    Inappropriate sinus tachycardia (HCC)    a. exacerbated by pregnancy - 2018; b. 01/2017 Holter: 29% of time in sinus tachycardia;  c. 02/2017 Echo: EF 65-70%, no rwma; c. 01/2017 Holter: 29% of time in sinus tachycardia.   Kidney stones    Upper respiratory tract infection 05/27/2020    Past Surgical History:  Procedure Laterality Date   APPENDECTOMY     CHOLECYSTECTOMY N/A 01/18/2016   Procedure: LAPAROSCOPIC  CHOLECYSTECTOMY WITH INTRAOPERATIVE CHOLANGIOGRAM;  Surgeon: Morene Olives, MD;  Location: [facility];  Service: General;  Laterality: N/A;   ruptured cyst     TONSILLECTOMY AND ADENOIDECTOMY     age 48   TYMPANOSTOMY TUBE PLACEMENT        OB History     Gravida  4   Para  2   Term  2   Preterm      AB  1   Living  2      SAB  1   IAB      Ectopic      Multiple  0   Live Births  2               Current Medications: Current Meds  Medication Sig   albuterol  (VENTOLIN  HFA) 108 (90 Base) MCG/ACT inhaler Inhale 1 puff into the lungs every 6 (six) hours as needed for wheezing or shortness of breath.   aspirin  EC 81 MG tablet Take 2 tablets (162 mg total) by mouth at bedtime. Start taking when you are [redacted] weeks pregnant for rest of pregnancy for prevention of preeclampsia   Continuous Glucose Sensor (DEXCOM G6 SENSOR) MISC 1 Package by Does not apply route every 30 (thirty) days.   Continuous Glucose Transmitter (DEXCOM G6 TRANSMITTER) MISC 1 Package  by Does not apply route every 3 (three) months.   insulin  glargine (LANTUS ) 100 UNIT/ML Solostar Pen Inject 6 Units into the skin at bedtime.   Insulin  Pen Needle (PEN NEEDLES) 33G X 4 MM MISC Please use as directed   ondansetron  (ZOFRAN -ODT) 4 MG disintegrating tablet Take 1 tablet (4 mg total) by mouth every 6 (six) hours as needed for nausea.   Prenatal Vit-Fe Fumarate-FA (PRENATAL MULTIVITAMIN) TABS tablet Take 1 tablet by mouth daily at 12 noon.   [DISCONTINUED] labetalol  (NORMODYNE ) 200 MG tablet Take 1 tablet (200 mg total) by mouth 2 (two) times daily.     Allergies:   Latex   Social History   Socioeconomic History   Marital status: Married    Spouse name: Not on file   Number of children: Not on file   Years of education: Not on file   Highest education level: Not on file  Occupational History   Not on file  Tobacco Use   Smoking status: Former    Current packs/day: 0.00    Types: Cigarettes    Quit  date: 03/03/2008    Years since quitting: 15.6   Smokeless tobacco: Never   Tobacco comments:    Smoked occ, no smoking for years, per pt  Vaping Use   Vaping status: Never Used  Substance and Sexual Activity   Alcohol use: Not Currently    Comment: last use 10/28/2021 one drink   Drug use: No   Sexual activity: Yes    Partners: Male    Birth control/protection: None    Comment: hx ocp, depo, condom  Other Topics Concern   Not on file  Social History Narrative   Not on file   Social Drivers of Health   Financial Resource Strain: Not on file  Food Insecurity: No Food Insecurity (07/04/2022)   Hunger Vital Sign    Worried About Running Out of Food in the Last Year: Never true    Ran Out of Food in the Last Year: Never true  Transportation Needs: No Transportation Needs (07/04/2022)   PRAPARE - Administrator, Civil Service (Medical): No    Lack of Transportation (Non-Medical): No  Physical Activity: Not on file  Stress: Not on file  Social Connections: Not on file      Family History  Problem Relation Age of Onset   Arthritis Mother    Hypertension Mother    Diabetes Mother    Cervical cancer Mother    Alcohol abuse Father    Arthritis Father    Hypertension Father    Arthritis Maternal Grandmother    Heart disease Maternal Grandmother    Stroke Maternal Grandmother    Hypertension Maternal Grandmother    Diabetes Maternal Grandmother    Arthritis Maternal Grandfather    Colon cancer Maternal Grandfather    Prostate cancer Maternal Grandfather    Heart disease Maternal Grandfather    Stroke Maternal Grandfather    Hypertension Maternal Grandfather    Arthritis Paternal Grandmother    Heart disease Paternal Grandmother    Hypertension Paternal Grandmother    Diabetes Paternal Grandmother    Arthritis Paternal Grandfather    Heart disease Paternal Grandfather    Hypertension Paternal Grandfather    Thyroid  disease Maternal Aunt        thyroid   cancer      ROS:   Please see the history of present illness.    Shortness of breath All other systems reviewed and are  negative.   Labs/EKG Reviewed:    EKG:   EKG was ordered today.  The ekg ordered today demonstrates NSR HR 91 bpm  Recent Labs: 06/10/2023: TSH 1.160 07/08/2023: Hemoglobin 12.4; Platelets 257 10/08/2023: ALT 7; BUN 7; Creatinine, Ser 0.60; Magnesium  1.7; Potassium 4.1; Sodium 139   Recent Lipid Panel No results found for: CHOL, TRIG, HDL, CHOLHDL, LDLCALC, LDLDIRECT  Physical Exam:    VS:  BP (!) 130/94 (BP Location: Right Arm, Patient Position: Sitting, Cuff Size: Large)   Pulse 90   Ht 5' 5 (1.651 m)   Wt (!) 342 lb 12.8 oz (155.5 kg)   LMP 04/08/2023   SpO2 98%   BMI 57.04 kg/m     Wt Readings from Last 3 Encounters:  10/30/23 (!) 342 lb 12.8 oz (155.5 kg)  10/28/23 (!) 340 lb (154.2 kg)  10/18/23 (!) 339 lb (153.8 kg)     GEN:  Well nourished, well developed in no acute distress HEENT: Normal NECK: No JVD; No carotid bruits LYMPHATICS: No lymphadenopathy CARDIAC: RRR, no murmurs, rubs, gallops RESPIRATORY:  Clear to auscultation without rales, wheezing or rhonchi  ABDOMEN: Soft, non-tender, non-distended MUSCULOSKELETAL:  No edema; No deformity  SKIN: Warm and dry NEUROLOGIC:  Alert and oriented x 3 PSYCHIATRIC:  Normal affect    Risk Assessment/Risk Calculators:                  ASSESSMENT & PLAN:    Hypertension in Pregnancy We stopped her labetalol  at the visit with our cardio B Pharm.D., she has been started on labetalol  200 mg twice daily.  She is still slightly elevated so I am going to go up to 200 mg 3 times daily. Okay with gentle diuretics if needed.  Gestational Diabetes No current issues reported. History of gestational diabetes in previous pregnancy. -Continue current management.  Preeclampsia Prevention Currently on Aspirin  162mg  daily. No history of preeclampsia  Monitor showed rare PSVT, if  blood pressure improve and still have sob will plan to treat the rare psvt.  Follow-up in 6 weeks. If any concerns arise before then, patient to contact via MyChart.    Patient Instructions  Medication Instructions:  Your physician has recommended you make the following change in your medication:  INCREASE: Labetalol  200 mg three times daily *If you need a refill on your cardiac medications before your next appointment, please call your pharmacy*   Follow-Up: At [facility], you and your health needs are our priority.  As part of our continuing mission to provide you with exceptional heart care, we have created designated Provider Care Teams.  These Care Teams include your primary Cardiologist (physician) and Advanced Practice Providers (APPs -  Physician Assistants and Nurse Practitioners) who all work together to provide you with the care you need, when you need it.  Your next appointment:   3-4 week(s)  Provider:   Fartun Paradiso, DO     Other Instructions: Please keep your appointment with [place] Monday 2/17 at 11:30am       Dispo:  No follow-ups on file.   Medication Adjustments/Labs and Tests Ordered: Current medicines are reviewed at length with the patient today.  Concerns regarding medicines are outlined above.  Tests Ordered: No orders of the defined types were placed in this encounter.  Medication Changes: Meds ordered this encounter  Medications   labetalol  (NORMODYNE ) 200 MG tablet    Sig: Take 1 tablet (200 mg total) by mouth 3 (three) times daily.  Dispense:  270 tablet    Refill:  3

## 2023-10-31 ENCOUNTER — Telehealth: Payer: Self-pay

## 2023-10-31 NOTE — Telephone Encounter (Signed)
 Left message for patient to call back - need to move her to NST ok per Dr. Arnie Bibber - need ultrasound slots for UA Doppler patient

## 2023-11-01 ENCOUNTER — Ambulatory Visit: Payer: 59 | Admitting: *Deleted

## 2023-11-01 ENCOUNTER — Other Ambulatory Visit: Payer: Self-pay | Admitting: Obstetrics

## 2023-11-01 ENCOUNTER — Ambulatory Visit: Payer: 59 | Attending: Obstetrics

## 2023-11-01 ENCOUNTER — Ambulatory Visit (HOSPITAL_BASED_OUTPATIENT_CLINIC_OR_DEPARTMENT_OTHER): Payer: 59 | Admitting: Obstetrics and Gynecology

## 2023-11-01 VITALS — BP 130/87 | HR 88

## 2023-11-01 DIAGNOSIS — Z3A32 32 weeks gestation of pregnancy: Secondary | ICD-10-CM | POA: Diagnosis present

## 2023-11-01 DIAGNOSIS — E6689 Other obesity not elsewhere classified: Secondary | ICD-10-CM | POA: Diagnosis present

## 2023-11-01 DIAGNOSIS — O283 Abnormal ultrasonic finding on antenatal screening of mother: Secondary | ICD-10-CM | POA: Diagnosis present

## 2023-11-01 DIAGNOSIS — O99213 Obesity complicating pregnancy, third trimester: Secondary | ICD-10-CM

## 2023-11-01 DIAGNOSIS — O24410 Gestational diabetes mellitus in pregnancy, diet controlled: Secondary | ICD-10-CM | POA: Diagnosis present

## 2023-11-01 DIAGNOSIS — O0990 Supervision of high risk pregnancy, unspecified, unspecified trimester: Secondary | ICD-10-CM | POA: Insufficient documentation

## 2023-11-01 DIAGNOSIS — O24414 Gestational diabetes mellitus in pregnancy, insulin controlled: Secondary | ICD-10-CM | POA: Diagnosis not present

## 2023-11-01 DIAGNOSIS — O10919 Unspecified pre-existing hypertension complicating pregnancy, unspecified trimester: Secondary | ICD-10-CM

## 2023-11-01 DIAGNOSIS — E669 Obesity, unspecified: Secondary | ICD-10-CM

## 2023-11-01 DIAGNOSIS — O24419 Gestational diabetes mellitus in pregnancy, unspecified control: Secondary | ICD-10-CM | POA: Diagnosis present

## 2023-11-01 DIAGNOSIS — O10913 Unspecified pre-existing hypertension complicating pregnancy, third trimester: Secondary | ICD-10-CM | POA: Diagnosis present

## 2023-11-01 DIAGNOSIS — O10013 Pre-existing essential hypertension complicating pregnancy, third trimester: Secondary | ICD-10-CM | POA: Insufficient documentation

## 2023-11-01 DIAGNOSIS — O09293 Supervision of pregnancy with other poor reproductive or obstetric history, third trimester: Secondary | ICD-10-CM

## 2023-11-01 DIAGNOSIS — O3663X0 Maternal care for excessive fetal growth, third trimester, not applicable or unspecified: Secondary | ICD-10-CM

## 2023-11-01 DIAGNOSIS — O09523 Supervision of elderly multigravida, third trimester: Secondary | ICD-10-CM

## 2023-11-01 NOTE — Progress Notes (Signed)
  Maternal-Fetal Medicine Consultation I had the pleasure of seeing [name] today at [facility]. She is G4 P2012 at 32w 4d gestation and is here for antenatal testing. Her problems include: -Gestational diabetes.  Patient reports that his Dexcom has dropped out.  Her fasting levels have been high and Lantus  insulin  6 units was started 4 days ago at her prenatal visit.  Patient reports her postprandial levels are within normal range before starting Lantus  insulin . -Chronic hypertension.  Nifedipine  was discontinued and labetalol  200 mg 3 times daily was started.  Blood pressure today at our office is 130/87 mmHg. -Paroxysmal supraventricular tachycardia (PSVT)discovered on Zio patch.  Patient is being followed by her cardiologist whose note mentioned that she has rare PSVT.  Ultrasound Amniotic fluid is normal good fetal activity seen.  Fetal breathing movements did not meet the criteria of BPP.  NST is reactive.  BPP 8/10. We reassured the patient of the findings.  I discussed the importance of checking her blood glucose regularly to prevent fetal and neonatal adverse outcomes.  Fasting levels were still high and would like to watch for 2 more days before increasing the dosage of Lantus  insulin .  Patient is planning to have dexacom inserted.  I reassured the patient of safety profile of labetalol .  It can be associated with smaller babies in some cases. We discussed timing of delivery.  If diabetes is not well-controlled, early term delivery is appropriate.  Recommendations -Continue weekly BPP till delivery. -Recommend weekly prenatal visits to address diabetes.  Thank you for consultation.  If you have any questions or concerns, please contact me the [facility].  Consultation including face-to-face (more than 50%) counseling 20 minutes.

## 2023-11-01 NOTE — Procedures (Signed)
 Jane Jackson [birth year] [redacted]w[redacted]d  Fetus A Non-Stress Test Interpretation for 11/01/23   BPP with NST  Indication: Unsatisfactory BPP  Fetal Heart Rate A Mode: External Baseline Rate (A): 135 bpm Variability: Moderate Accelerations: 15 x 15 Decelerations: None Multiple birth?: No  Uterine Activity Mode: Palpation, Toco Contraction Frequency (min): none Resting Tone Palpated: Relaxed  Interpretation (Fetal Testing) Nonstress Test Interpretation: Reactive Overall Impression: Reassuring for gestational age Comments: Dr. Arna reviewed tracing

## 2023-11-08 ENCOUNTER — Ambulatory Visit: Payer: 59 | Attending: Obstetrics

## 2023-11-08 ENCOUNTER — Ambulatory Visit: Payer: 59

## 2023-11-08 DIAGNOSIS — O99213 Obesity complicating pregnancy, third trimester: Secondary | ICD-10-CM | POA: Insufficient documentation

## 2023-11-08 DIAGNOSIS — E669 Obesity, unspecified: Secondary | ICD-10-CM | POA: Diagnosis not present

## 2023-11-08 DIAGNOSIS — O24414 Gestational diabetes mellitus in pregnancy, insulin controlled: Secondary | ICD-10-CM

## 2023-11-08 DIAGNOSIS — O3663X0 Maternal care for excessive fetal growth, third trimester, not applicable or unspecified: Secondary | ICD-10-CM

## 2023-11-08 DIAGNOSIS — O24419 Gestational diabetes mellitus in pregnancy, unspecified control: Secondary | ICD-10-CM | POA: Diagnosis present

## 2023-11-08 DIAGNOSIS — O10913 Unspecified pre-existing hypertension complicating pregnancy, third trimester: Secondary | ICD-10-CM | POA: Insufficient documentation

## 2023-11-08 DIAGNOSIS — O10013 Pre-existing essential hypertension complicating pregnancy, third trimester: Secondary | ICD-10-CM

## 2023-11-08 DIAGNOSIS — O09293 Supervision of pregnancy with other poor reproductive or obstetric history, third trimester: Secondary | ICD-10-CM

## 2023-11-08 DIAGNOSIS — Z3A33 33 weeks gestation of pregnancy: Secondary | ICD-10-CM

## 2023-11-08 DIAGNOSIS — O09523 Supervision of elderly multigravida, third trimester: Secondary | ICD-10-CM

## 2023-11-11 ENCOUNTER — Telehealth: Payer: Self-pay | Admitting: Cardiology

## 2023-11-11 ENCOUNTER — Ambulatory Visit: Payer: 59 | Admitting: Pharmacist

## 2023-11-11 ENCOUNTER — Encounter: Payer: Self-pay | Admitting: Obstetrics and Gynecology

## 2023-11-11 ENCOUNTER — Ambulatory Visit (INDEPENDENT_AMBULATORY_CARE_PROVIDER_SITE_OTHER): Payer: Medicaid Other | Admitting: Obstetrics and Gynecology

## 2023-11-11 VITALS — BP 133/92 | HR 81 | Wt 346.0 lb

## 2023-11-11 DIAGNOSIS — I471 Supraventricular tachycardia, unspecified: Secondary | ICD-10-CM

## 2023-11-11 DIAGNOSIS — Z6841 Body Mass Index (BMI) 40.0 and over, adult: Secondary | ICD-10-CM | POA: Diagnosis not present

## 2023-11-11 DIAGNOSIS — I10 Essential (primary) hypertension: Secondary | ICD-10-CM

## 2023-11-11 DIAGNOSIS — O99210 Obesity complicating pregnancy, unspecified trimester: Secondary | ICD-10-CM | POA: Diagnosis not present

## 2023-11-11 DIAGNOSIS — O2343 Unspecified infection of urinary tract in pregnancy, third trimester: Secondary | ICD-10-CM

## 2023-11-11 DIAGNOSIS — Z3A34 34 weeks gestation of pregnancy: Secondary | ICD-10-CM | POA: Diagnosis not present

## 2023-11-11 DIAGNOSIS — O99513 Diseases of the respiratory system complicating pregnancy, third trimester: Secondary | ICD-10-CM

## 2023-11-11 DIAGNOSIS — O24414 Gestational diabetes mellitus in pregnancy, insulin controlled: Secondary | ICD-10-CM

## 2023-11-11 DIAGNOSIS — O09523 Supervision of elderly multigravida, third trimester: Secondary | ICD-10-CM

## 2023-11-11 DIAGNOSIS — J45909 Unspecified asthma, uncomplicated: Secondary | ICD-10-CM

## 2023-11-11 DIAGNOSIS — O10919 Unspecified pre-existing hypertension complicating pregnancy, unspecified trimester: Secondary | ICD-10-CM

## 2023-11-11 MED ORDER — FUROSEMIDE 40 MG PO TABS: 40.0000 mg | ORAL_TABLET | Freq: Every day | ORAL | 0 refills | Status: DC

## 2023-11-11 NOTE — Telephone Encounter (Signed)
Called and spoke with patient. Currently being treated for a UTI with cephalexin three times daily. Reports she feels poorly.  Swelling has returned and she is uncomfortable. Will refill 3 days of lasix.  Tolerating labetalol however sometimes forgets to take third dose of day. Will switch from 200mg  TID to 300mg  BID.  Reports fasting blood sugar still above goal. Has OB appointment this morning and thinks insulin will be increased.

## 2023-11-11 NOTE — Progress Notes (Unsigned)
ROB   Pt not feeling well today. Notes was seen recently for UTI at urgent care was peeing blood on antibiotics cephalexin 500mg  Labetalol Rx was changed to BID .

## 2023-11-11 NOTE — Telephone Encounter (Signed)
From 10/08/23 appointment: Hypertension in Pregnancy Blood pressure still elevated will increase her nifedipine 30 mg twice daily. With the leg swelling will give her 3 days of lasix   Returned call to patient of Dr. Servando Salina. She has a kidney infection -- cannot make her appt today for med follow up. She does have an appt next week with Dr. Servando Salina. She reports feeling "puffy" -- swelling everywhere, hands, face, legs -- not a sudden onset. No rapid weigh gain.   She has an OB visit today.  She had an appt last week - baby was fine per report.  Notes from 11/01/23 visit in [facility] - BP was 130/87  BPs at home 130s-140s/80s-90s  She thinks her OB today may increase her insulin today   She wanted to get a message to the pharmacist she was supposed to see today, since she had to cancel. Advised it may be best to let OB evaluate her swelling and determine course of action with lasix since they are evaluating her in person.

## 2023-11-11 NOTE — Telephone Encounter (Signed)
Pt c/o medication issue:  1. Name of Medication:   furosemide (LASIX) 40 MG tablet (Expired)    2. How are you currently taking this medication (dosage and times per day)?   3. Are you having a reaction (difficulty breathing--STAT)?   4. What is your medication issue?  Patient had to cancel appt today with pharmacist due to kidney infection, but wanted call back to discuss lasix and other med changes. Please advise.

## 2023-11-12 DIAGNOSIS — O09523 Supervision of elderly multigravida, third trimester: Secondary | ICD-10-CM | POA: Insufficient documentation

## 2023-11-12 DIAGNOSIS — O2343 Unspecified infection of urinary tract in pregnancy, third trimester: Secondary | ICD-10-CM | POA: Insufficient documentation

## 2023-11-12 NOTE — Progress Notes (Signed)
PRENATAL VISIT NOTE  Subjective:  Jane Jackson is a 38 y.o. [id number] at [redacted]w[redacted]d being seen today for ongoing prenatal care.  She is currently monitored for the following issues for this high-risk pregnancy and has Maternal morbid obesity, antepartum (HCC); Uncomplicated asthma; Supervision of high risk pregnancy, antepartum; BMI 50.0-59.9, adult (HCC); Preexisting hypertension complicating pregnancy, antepartum; Gestational diabetes mellitus, antepartum; Excessive fetal growth affecting management of mother in third trimester, antepartum; PSVT (paroxysmal supraventricular tachycardia) (HCC); Urinary tract infection in mother during third trimester of pregnancy; and AMA (advanced maternal age) multigravida 35+, third trimester on their problem list.  Patient reports  tired and hard to catch breath at times .  Contractions: Irritability. Vag. Bleeding: None, Other.  Movement: Present. Denies leaking of fluid.   The following portions of the patient's history were reviewed and updated as appropriate: allergies, current medications, past family history, past medical history, past social history, past surgical history and problem list.   Objective:   Vitals:   11/11/23 1552  BP: (!) 133/92  Pulse: 81  Weight: (!) 346 lb (156.9 kg)    Fetal Status: Fetal Heart Rate (bpm): 148   Movement: Present     General:  Alert, oriented and cooperative. Patient is in no acute distress.  Skin: Skin is warm and dry. No rash noted.   Cardiovascular: Normal heart rate noted  Respiratory: Normal respiratory effort, no problems with respiration noted  Abdomen: Soft, gravid, appropriate for gestational age.  Pain/Pressure: Present     Pelvic: Cervical exam deferred        Extremities: Normal range of motion.  Edema: Deep pitting, indentation remains for a short time  Mental Status: Normal mood and affect. Normal behavior. Normal judgment and thought content.   Assessment and Plan:  Pregnancy: [id number] at  [redacted]w[redacted]d 1. Urinary tract infection in mother during third trimester of pregnancy (Primary) Diagnosed via u/a at urgent care on 2/14. Pt states she's feeling better after being on the keflex. UCx still pending. Adequate hydration recommended.   2. [redacted] weeks gestation of pregnancy Doesn't desire any more kids. D/w her re: BTL if she has a c/s, due to likely unable to do a ppBTL due to her BMI and need for 30 days. Patient doesn't want anymore children but doesn't want to sign BTL forms. Patient interested in immediate PP LARC.   2/14: efw 89%, AC 99%, 2634g, afi 12, 8/8, ceph Continue with weekly testing Recommend delivery at 37wks; pt set up for 3/13 AM (pt preference) Needs GBS next visit  Patient currently working two jobs and has six year old and 72 month old at home. I told her I recommend she go out on disability to try and get her to 37wks.   3. BMI 50.0-59.9, adult (HCC) 24lbs total weight gain but 6lbs in the last 14 days. Continue to follow closely  4. Maternal morbid obesity, antepartum (HCC)  5. Insulin controlled gestational diabetes mellitus (GDM) during pregnancy, antepartum On lantus 6u at bedtime and dexcom in place. Sugars still a little elevated but much improved with am fastings in the 100s and 2 hour post prandials wnl. Recommend keeping current dose for now and adjusting next week as needed, given adjust two other meds today (see below).   6. Preexisting hypertension complicating pregnancy, antepartum Adjusted from labetalol 200 tid to bid due to lethargy; unable to do procardia due to headaches. Cardiology also started her on a short lasix course today. Continue low dose ASA  7. PSVT (  paroxysmal supraventricular tachycardia) (HCC) Based on late 2024 holter. Early 2025 echo wnl  8. Asthma affecting pregnancy in third trimester On no current meds. Consider adding INH next visit  9. AMA (advanced maternal age) multigravida 35+, third trimester No issues.   Preterm  labor symptoms and general obstetric precautions including but not limited to vaginal bleeding, contractions, leaking of fluid and fetal movement were reviewed in detail with the patient. Please refer to After Visit Summary for other counseling recommendations.   Return in about 1 week (around 11/18/2023) for in person or virtual, md visit, high risk ob.  Future Appointments  Date Time Provider Department Center  11/15/2023  1:15 PM [facility] NURSE [facility]  11/15/2023  1:30 PM [facility] US2 [facility]  11/18/2023  2:30 PM Anyanwu, Jethro Bastos, MD [facility]  11/20/2023  4:20 PM Tobb, Kardie, DO [facility] None  11/22/2023  1:15 PM [facility] NURSE [facility]  11/22/2023  1:30 PM [facility] US2 [facility]  11/25/2023  3:30 PM Anyanwu, Jethro Bastos, MD [facility]  11/29/2023  1:15 PM [facility] NURSE [facility]  11/29/2023  1:30 PM [facility] US2 [facility]  12/02/2023  3:30 PM Federico Flake, MD [facility]  12/05/2023  6:30 AM [facility] SCHED ROOM [facility] None    [place] Bing, MD

## 2023-11-15 ENCOUNTER — Ambulatory Visit (HOSPITAL_COMMUNITY)
Admission: AD | Admit: 2023-11-15 | Discharge: 2023-11-15 | Disposition: A | Discharge status: Home / Self Care | Payer: 59 | Attending: Obstetrics & Gynecology | Admitting: Obstetrics & Gynecology

## 2023-11-15 ENCOUNTER — Encounter (HOSPITAL_COMMUNITY): Payer: Self-pay | Admitting: Obstetrics & Gynecology

## 2023-11-15 ENCOUNTER — Ambulatory Visit: Payer: 59

## 2023-11-15 DIAGNOSIS — O0990 Supervision of high risk pregnancy, unspecified, unspecified trimester: Secondary | ICD-10-CM

## 2023-11-15 DIAGNOSIS — O3663X0 Maternal care for excessive fetal growth, third trimester, not applicable or unspecified: Secondary | ICD-10-CM | POA: Diagnosis not present

## 2023-11-15 DIAGNOSIS — O99513 Diseases of the respiratory system complicating pregnancy, third trimester: Secondary | ICD-10-CM | POA: Insufficient documentation

## 2023-11-15 DIAGNOSIS — O10919 Unspecified pre-existing hypertension complicating pregnancy, unspecified trimester: Secondary | ICD-10-CM

## 2023-11-15 DIAGNOSIS — I471 Supraventricular tachycardia, unspecified: Secondary | ICD-10-CM | POA: Diagnosis not present

## 2023-11-15 DIAGNOSIS — Z3A34 34 weeks gestation of pregnancy: Secondary | ICD-10-CM | POA: Diagnosis not present

## 2023-11-15 DIAGNOSIS — O09893 Supervision of other high risk pregnancies, third trimester: Secondary | ICD-10-CM | POA: Insufficient documentation

## 2023-11-15 DIAGNOSIS — O10913 Unspecified pre-existing hypertension complicating pregnancy, third trimester: Secondary | ICD-10-CM | POA: Diagnosis not present

## 2023-11-15 DIAGNOSIS — O09523 Supervision of elderly multigravida, third trimester: Secondary | ICD-10-CM | POA: Insufficient documentation

## 2023-11-15 DIAGNOSIS — O99213 Obesity complicating pregnancy, third trimester: Secondary | ICD-10-CM | POA: Diagnosis not present

## 2023-11-15 DIAGNOSIS — O99413 Diseases of the circulatory system complicating pregnancy, third trimester: Secondary | ICD-10-CM | POA: Diagnosis not present

## 2023-11-15 DIAGNOSIS — O24414 Gestational diabetes mellitus in pregnancy, insulin controlled: Secondary | ICD-10-CM | POA: Diagnosis not present

## 2023-11-15 DIAGNOSIS — O10013 Pre-existing essential hypertension complicating pregnancy, third trimester: Secondary | ICD-10-CM | POA: Insufficient documentation

## 2023-11-15 DIAGNOSIS — R55 Syncope and collapse: Secondary | ICD-10-CM | POA: Diagnosis present

## 2023-11-15 DIAGNOSIS — J45909 Unspecified asthma, uncomplicated: Secondary | ICD-10-CM | POA: Insufficient documentation

## 2023-11-15 LAB — COMPREHENSIVE METABOLIC PANEL
ALT: 10 U/L (ref 0–44)
AST: 16 U/L (ref 15–41)
Albumin: 2.5 g/dL — ABNORMAL LOW (ref 3.5–5.0)
Alkaline Phosphatase: 81 U/L (ref 38–126)
Anion gap: 12 (ref 5–15)
BUN: 8 mg/dL (ref 6–20)
CO2: 22 mmol/L (ref 22–32)
Calcium: 9 mg/dL (ref 8.9–10.3)
Chloride: 102 mmol/L (ref 98–111)
Creatinine, Ser: 0.64 mg/dL (ref 0.44–1.00)
GFR, Estimated: >60 mL/min (ref >60)
Glucose, Bld: 98 mg/dL (ref 70–99)
Potassium: 4.4 mmol/L (ref 3.5–5.1)
Sodium: 136 mmol/L (ref 135–145)
Total Bilirubin: 0.3 mg/dL (ref 0.0–1.2)
Total Protein: 5.9 g/dL — ABNORMAL LOW (ref 6.5–8.1)

## 2023-11-15 LAB — CBC
HCT: 35.9 % — ABNORMAL LOW (ref 36.0–46.0)
Hemoglobin: 11.8 g/dL — ABNORMAL LOW (ref 12.0–15.0)
MCH: 29.1 pg (ref 26.0–34.0)
MCHC: 32.9 g/dL (ref 30.0–36.0)
MCV: 88.4 fL (ref 80.0–100.0)
Platelets: 258 10*3/uL (ref 150–400)
RBC: 4.06 MIL/uL (ref 3.87–5.11)
RDW: 13.8 % (ref 11.5–15.5)
WBC: 14.7 10*3/uL — ABNORMAL HIGH (ref 4.0–10.5)
nRBC: 0 % (ref 0.0–0.2)

## 2023-11-15 LAB — URINALYSIS, ROUTINE W REFLEX MICROSCOPIC
Bilirubin Urine: NEGATIVE
Glucose, UA: NEGATIVE mg/dL
Hgb urine dipstick: NEGATIVE
Ketones, ur: NEGATIVE mg/dL
Leukocytes,Ua: NEGATIVE
Nitrite: NEGATIVE
Protein, ur: 30 mg/dL — AB
Specific Gravity, Urine: 1.023 (ref 1.005–1.030)
pH: 5 (ref 5.0–8.0)

## 2023-11-15 LAB — PROTEIN / CREATININE RATIO, URINE
Creatinine, Urine: 206 mg/dL
Protein Creatinine Ratio: 0.1 mg/mg{creat} (ref 0.00–0.15)
Total Protein, Urine: 20 mg/dL

## 2023-11-15 LAB — GLUCOSE, CAPILLARY: Glucose-Capillary: 107 mg/dL — ABNORMAL HIGH (ref 70–99)

## 2023-11-15 MED ORDER — LABETALOL HCL 5 MG/ML IV SOLN: 80.0000 mg | INTRAVENOUS | Status: DC | PRN

## 2023-11-15 MED ORDER — HYDRALAZINE HCL 20 MG/ML IJ SOLN: 10.0000 mg | INTRAMUSCULAR | Status: DC | PRN

## 2023-11-15 MED ORDER — LABETALOL HCL 5 MG/ML IV SOLN: 20.0000 mg | INTRAVENOUS | Status: DC | PRN

## 2023-11-15 MED ORDER — LABETALOL HCL 5 MG/ML IV SOLN: 40.0000 mg | INTRAVENOUS | Status: DC | PRN

## 2023-11-15 NOTE — MAU Note (Signed)
.  Jane Jackson is a 38 y.o. at [redacted]w[redacted]d here in MAU reporting: Arrived EMS with c/o near syncope while sitting in salon chair getting her hair done. She reports her hair was being curled. She denies feeling hot at that time. She reports all of a sudden she began to feel light headed and as if the room was closing in. She reports her dexcom read 151. Denies VB or LOF. +FM.  GDM. On insulin. Dexcom reads 126 in triage. CHTN, takes labetalol.   Was supposed to see MFM today at 1300.  Onset of complaint: 1115 Pain score: Denies pain.  Vitals:   11/15/23 1253 11/15/23 1254  BP: (!) 184/87 (!) 157/80  Pulse: 83 (!) 109  Resp:    Temp:    SpO2:        FHT: 140 initial external Lab orders placed from triage: UA, Orthostatic VS, POCT CBG

## 2023-11-15 NOTE — MAU Note (Signed)
Called Main Lab to request Protein Creatinine Ration to be added on.

## 2023-11-15 NOTE — MAU Provider Note (Signed)
 Chief Complaint:  Near Syncope   Event Date/Time   First Provider Initiated Contact with Patient 11/15/23 1333     HPI  HPI: Jane Jackson is a 38 y.o. [id number] at 69w4dwho presents to maternity admissions reporting presyncope while getting her hair done at the salon.   She reports good fetal movement, denies LOF, vaginal bleeding, vaginal itching/burning, urinary symptoms, h/a, dizziness, n/v, diarrhea, constipation or fever/chills.  She denies headache, visual changes or RUQ abdominal pain.  She is currently monitored for the following issues for this high-risk pregnancy and has Maternal morbid obesity, antepartum (HCC); Uncomplicated asthma; Supervision of high risk pregnancy, antepartum; BMI 50.0-59.9, adult (HCC); Preexisting hypertension complicating pregnancy, antepartum; Gestational diabetes mellitus, antepartum; Excessive fetal growth affecting management of mother in third trimester, antepartum; PSVT (paroxysmal supraventricular tachycardia) (HCC); Urinary tract infection in mother during third trimester of pregnancy; and AMA (advanced maternal age) multigravida 35+, third trimester on their problem list.   Past Medical History: Past Medical History:  Diagnosis Date   Anxiety    Asthma    Chicken pox    Complication of anesthesia    issue with asthma and intubation   Cystic dysplasia of one kidney    about 4 months ago-no f/u- largest 5mm?   Diverticulitis    Family history of cystic fibrosis    Normal CF screen   Family history of mental retardation    Fibroid, uterine    dx a few months ago   GERD (gastroesophageal reflux disease)    Gestational diabetes    H/O mitral valve prolapse    as a child   History of mitral valve prolapse 09/16/2016   Hypertension    Impaction, bowel (HCC)    Inappropriate sinus tachycardia (HCC)    a. exacerbated by pregnancy - 2018; b. 01/2017 Holter: 29% of time in sinus tachycardia;  c. 02/2017 Echo: EF 65-70%, no rwma; c. 01/2017 Holter:  29% of time in sinus tachycardia.   Kidney stones    Prediabetes 01/16/2022   Upper respiratory tract infection 05/27/2020    Past obstetric history: OB History  Gravida Para Term Preterm AB Living  4 2 2  1 2   SAB IAB Ectopic Multiple Live Births  1   0 2    # Outcome Date GA Lbr Len/2nd Weight Sex Type Anes PTL Lv  4 Current           3 SAB 02/2023          2 Term 07/06/22 [redacted]w[redacted]d / 168:15 3180 g M Vag-Vacuum EPI  LIV     Birth Comments: WDL  1 Term 03/13/17 [redacted]w[redacted]d 24:30 / 00:47 2850 g M Vag-Spont EPI  LIV    Past Surgical History: Past Surgical History:  Procedure Laterality Date   APPENDECTOMY     CHOLECYSTECTOMY N/A 01/18/2016   Procedure: LAPAROSCOPIC CHOLECYSTECTOMY WITH INTRAOPERATIVE CHOLANGIOGRAM;  Surgeon: Glenna Fellows, MD;  Location: [facility];  Service: General;  Laterality: N/A;   ruptured cyst     TONSILLECTOMY AND ADENOIDECTOMY     age 66   TYMPANOSTOMY TUBE PLACEMENT      Family History: Family History  Problem Relation Age of Onset   Arthritis Mother    Hypertension Mother    Diabetes Mother    Cervical cancer Mother    Alcohol abuse Father    Arthritis Father    Hypertension Father    Arthritis Maternal Grandmother    Heart disease Maternal Grandmother    Stroke Maternal Grandmother  Hypertension Maternal Grandmother    Diabetes Maternal Grandmother    Arthritis Maternal Grandfather    Colon cancer Maternal Grandfather    Prostate cancer Maternal Grandfather    Heart disease Maternal Grandfather    Stroke Maternal Grandfather    Hypertension Maternal Grandfather    Arthritis Paternal Grandmother    Heart disease Paternal Grandmother    Hypertension Paternal Grandmother    Diabetes Paternal Grandmother    Arthritis Paternal Grandfather    Heart disease Paternal Grandfather    Hypertension Paternal Grandfather    Thyroid disease Maternal Aunt        thyroid cancer    Social History: Social History   Tobacco Use   Smoking status:  Former    Current packs/day: 0.00    Types: Cigarettes    Quit date: 03/03/2008    Years since quitting: 15.7   Smokeless tobacco: Never   Tobacco comments:    Smoked occ, no smoking for years, per pt  Vaping Use   Vaping status: Never Used  Substance Use Topics   Alcohol use: Not Currently    Comment: last use 10/28/2021 "one drink"   Drug use: No    Allergies:  Allergies  Allergen Reactions   Latex Itching and Rash    Meds:  Medications Prior to Admission  Medication Sig Dispense Refill Last Dose/Taking   albuterol (VENTOLIN HFA) 108 (90 Base) MCG/ACT inhaler Inhale 1 puff into the lungs every 6 (six) hours as needed for wheezing or shortness of breath.      aspirin EC 81 MG tablet Take 2 tablets (162 mg total) by mouth at bedtime. Start taking when you are [redacted] weeks pregnant for rest of pregnancy for prevention of preeclampsia 300 tablet 2    Continuous Glucose Sensor (DEXCOM G6 SENSOR) MISC 1 Package by Does not apply route every 30 (thirty) days. 3 each 6    Continuous Glucose Transmitter (DEXCOM G6 TRANSMITTER) MISC 1 Package by Does not apply route every 3 (three) months. 1 each 2    furosemide (LASIX) 40 MG tablet Take 1 tablet (40 mg total) by mouth daily for 3 days. 3 tablet 0    insulin glargine (LANTUS) 100 UNIT/ML Solostar Pen Inject 6 Units into the skin at bedtime. 15 mL 1    Insulin Pen Needle (PEN NEEDLES) 33G X 4 MM MISC Please use as directed 100 each 5    labetalol (NORMODYNE) 200 MG tablet Take 1 tablet (200 mg total) by mouth 3 (three) times daily. 270 tablet 3    ondansetron (ZOFRAN-ODT) 4 MG disintegrating tablet Take 1 tablet (4 mg total) by mouth every 6 (six) hours as needed for nausea. 20 tablet 0    potassium chloride SA (KLOR-CON M20) 20 MEQ tablet Take 1 tablet (20 mEq total) by mouth daily for 3 days. 3 tablet 0    Prenatal Vit-Fe Fumarate-FA (PRENATAL MULTIVITAMIN) TABS tablet Take 1 tablet by mouth daily at 12 noon.       I have reviewed patient's  Past Medical Hx, Surgical Hx, Family Hx, Social Hx, medications and allergies.   ROS:  Review of Systems Other systems negative  Physical Exam  Patient Vitals for the past 24 hrs:  BP Temp Temp src Pulse Resp SpO2  11/15/23 1515 (!) 122/58 -- -- 85 -- 98 %  11/15/23 1505 107/70 -- -- 95 -- 98 %  11/15/23 1430 138/74 -- -- 91 -- 97 %  11/15/23 1415 137/74 -- -- 86 -- 97 %  11/15/23 1400 128/70 -- -- 89 -- 97 %  11/15/23 1345 (!) 132/59 -- -- 77 -- 96 %  11/15/23 1330 132/60 -- -- 81 -- 96 %  11/15/23 1315 (!) 132/58 -- -- 79 -- 97 %  11/15/23 1254 (!) 157/80 -- -- (!) 109 -- --  11/15/23 1253 (!) 184/87 -- -- 83 -- --  11/15/23 1251 (!) 178/80 97.7 F (36.5 C) Oral 93 16 100 %   Constitutional: Well-developed, well-nourished female in no acute distress.  Cardiovascular: normal rate and rhythm Respiratory: normal effort, clear to auscultation bilaterally GI: Abd soft, non-tender, gravid appropriate for gestational age.   No rebound or guarding. MS: Extremities nontender, no edema, normal ROM Neurologic: Alert and oriented x 4.  GU: Neg CVAT.  FHT:  Baseline 130, moderate variability, accelerations present, no decelerations Contractions: None   Labs: Results for orders placed or performed during the hospital encounter of 11/15/23 (from the past 24 hours)  Urinalysis, Routine w reflex microscopic -Urine, Clean Catch     Status: Abnormal   Collection Time: 11/15/23  1:02 PM  Result Value Ref Range   Color, Urine YELLOW YELLOW   APPearance HAZY (A) CLEAR   Specific Gravity, Urine 1.023 1.005 - 1.030   pH 5.0 5.0 - 8.0   Glucose, UA NEGATIVE NEGATIVE mg/dL   Hgb urine dipstick NEGATIVE NEGATIVE   Bilirubin Urine NEGATIVE NEGATIVE   Ketones, ur NEGATIVE NEGATIVE mg/dL   Protein, ur 30 (A) NEGATIVE mg/dL   Nitrite NEGATIVE NEGATIVE   Leukocytes,Ua NEGATIVE NEGATIVE   RBC / HPF 0-5 0 - 5 RBC/hpf   WBC, UA 6-10 0 - 5 WBC/hpf   Bacteria, UA MANY (A) NONE SEEN   Squamous  Epithelial / HPF 11-20 0 - 5 /HPF   Mucus PRESENT    Hyaline Casts, UA PRESENT   Protein / creatinine ratio, urine     Status: None   Collection Time: 11/15/23  1:02 PM  Result Value Ref Range   Creatinine, Urine 206 mg/dL   Total Protein, Urine 20 mg/dL   Protein Creatinine Ratio 0.10 0.00 - 0.15 mg/mg[Cre]  Glucose, capillary     Status: Abnormal   Collection Time: 11/15/23  1:13 PM  Result Value Ref Range   Glucose-Capillary 107 (H) 70 - 99 mg/dL  Comprehensive metabolic panel     Status: Abnormal   Collection Time: 11/15/23  2:05 PM  Result Value Ref Range   Sodium 136 135 - 145 mmol/L   Potassium 4.4 3.5 - 5.1 mmol/L   Chloride 102 98 - 111 mmol/L   CO2 22 22 - 32 mmol/L   Glucose, Bld 98 70 - 99 mg/dL   BUN 8 6 - 20 mg/dL   Creatinine, Ser 1.61 0.44 - 1.00 mg/dL   Calcium 9.0 8.9 - 09.6 mg/dL   Total Protein 5.9 (L) 6.5 - 8.1 g/dL   Albumin 2.5 (L) 3.5 - 5.0 g/dL   AST 16 15 - 41 U/L   ALT 10 0 - 44 U/L   Alkaline Phosphatase 81 38 - 126 U/L   Total Bilirubin 0.3 0.0 - 1.2 mg/dL   GFR, Estimated >04 >54 mL/min   Anion gap 12 5 - 15  CBC     Status: Abnormal   Collection Time: 11/15/23  2:05 PM  Result Value Ref Range   WBC 14.7 (H) 4.0 - 10.5 K/uL   RBC 4.06 3.87 - 5.11 MIL/uL   Hemoglobin 11.8 (L) 12.0 - 15.0 g/dL  HCT 35.9 (L) 36.0 - 46.0 %   MCV 88.4 80.0 - 100.0 fL   MCH 29.1 26.0 - 34.0 pg   MCHC 32.9 30.0 - 36.0 g/dL   RDW 16.1 09.6 - 04.5 %   Platelets 258 150 - 400 K/uL   nRBC 0.0 0.0 - 0.2 %   A/Positive/-- (09/16 1431)  Imaging:  [place] MFM FETAL BPP WO NON STRESS Result Date: 11/08/2023 ----------------------------------------------------------------------  OBSTETRICS REPORT                       (Signed Final 11/08/2023 04:47 pm) ---------------------------------------------------------------------- Patient Info  ID #:       [id number]                          D.O.B.:  12-Sep-1986 (38 yrs)(F)  Name:       [name]                  Visit Date:  11/08/2023 01:21 pm ---------------------------------------------------------------------- Performed By  Attending:        Ma Rings MD         Ref. Address:     [address]                                                             [address]  Performed By:     Dennis Bast RDMS      [facility]  Referred By:      [facility] ---------------------------------------------------------------------- Orders  #  Description                           Code        Ordered By  1  [place] MFM FETAL BPP WO NON               76819.01    YU FANG     STRESS  2  [place] MFM OB FOLLOW UP                   [id number]  Rosana Hoes ----------------------------------------------------------------------  #  Order #                     Accession #                Episode #  1  [id number]                   [id number]                 [id number]  2  [id number]                   [id number]                 [id number] ---------------------------------------------------------------------- Indications  Hypertension - Chronic/Pre-existing            O10.019  (labetalol)  Gestational diabetes in pregnancy, insulin     O24.414  controlled  Obesity complicating pregnancy, third          O99.213  trimester  Large for gestational age fetus affecting      O36.60X0  management of mother  [redacted] weeks gestation of pregnancy                Z3A.33  Poor obstetric history: Previous gestational   O33.299  diabetes  LR female, neg horizon,  Advanced maternal age multigravida 66+,        O14.523  third trimester (57) ---------------------------------------------------------------------- Vital Signs                                                 Height:        5'5" ---------------------------------------------------------------------- Fetal  Evaluation  Num Of Fetuses:         1  Fetal Heart Rate(bpm):  125  Cardiac Activity:       Observed  Presentation:           Cephalic  Placenta:               Anterior  P. Cord Insertion:      Previously seen  Amniotic Fluid  AFI FV:      Within normal limits  AFI Sum(cm)     %Tile       Largest Pocket(cm)  12.44           37          4.14  RUQ(cm)       RLQ(cm)       LUQ(cm)        LLQ(cm)  3.9           3.18          1.22           4.14 ---------------------------------------------------------------------- Biophysical Evaluation  Amniotic F.V:   Within normal limits       F. Tone:        Observed  F. Movement:    Observed                   Score:          8/8  F. Breathing:   Observed ---------------------------------------------------------------------- Biometry  BPD:      87.6  mm     G. Age:  35w 3d         90  %    CI:  78.97   %    70 - 86                                                          FL/HC:      20.6   %    19.4 - 21.8  HC:      311.7  mm     G. Age:  34w 6d         47  %    HC/AC:      0.96        0.96 - 1.11  AC:      324.4  mm     G. Age:  36w 2d         99  %    FL/BPD:     73.2   %    71 - 87  FL:       64.1  mm     G. Age:  33w 1d         27  %    FL/AC:      19.8   %    20 - 24  Est. FW:    2634  gm    5 lb 13 oz      89  % ---------------------------------------------------------------------- OB History  Gravidity:    4         Term:   2        Prem:   0        SAB:   1  TOP:          0       Ectopic:  0        Living: 2 ---------------------------------------------------------------------- Gestational Age  LMP:           30w 4d        Date:  04/08/23                  EDD:   01/13/24  U/S Today:     35w 0d                                        EDD:   12/13/23  Best:          33w 4d     Det. By:  U/S C R L  (05/29/23)    EDD:   12/23/23 ---------------------------------------------------------------------- Anatomy  Cranium:               Previously seen        Aortic Arch:             Previously seen  Cavum:                 Previously seen        Ductal Arch:            Previously seen  Ventricles:            Appears normal         Diaphragm:              Appears normal  Choroid Plexus:        Previously seen  Stomach:                Appears normal, left                                                                        sided  Cerebellum:            Previously seen        Abdomen:                Previously seen  Posterior Fossa:       Previously seen        Abdominal Wall:         Previously seen  Face:                  Orbits and profile     Cord Vessels:           Previously seen                         previously seen  Lips:                  Previously seen        Kidneys:                Not well visualized  Thoracic:              Previously seen        Bladder:                Appears normal  Heart:                 Appears normal         Spine:                  Previously seen                         (4CH, axis, and                         situs)  RVOT:                  Previously seen        Upper Extremities:      Previously seen  LVOT:                  Previously seen        Lower Extremities:      Previously seen ---------------------------------------------------------------------- Cervix Uterus Adnexa  Cervix  Not visualized (advanced GA >24wks)  Uterus  No abnormality visualized.  Right Ovary  Not visualized.  Left Ovary  Not visualized.  Cul De Sac  No free fluid seen.  Adnexa  No abnormality visualized ---------------------------------------------------------------------- Comments  This patient was seen for a follow up growth scan due to  maternal obesity with a BMI of 55, chronic hypertension  treated with nifedipine, and gestational diabetes treated with  insulin.  The patient reports that she felt faint when she got up  today and possibly may be developing a urinary tract  infection.  She reports that her  blood pressures and  fingerstick values have been within normal  limits.  She was informed that the fetal growth measures large for  her gestational age (89th percentile).  There was normal  amniotic fluid noted.  A BPP performed today was 8 out of 8.  The patient was advised to go to the hospital should she  continue to feel faint.  Otherwise, she should call your office early next week to have  a urinalysis and urine culture performed.  She will return in 1 week for another BPP. ----------------------------------------------------------------------                   Ma Rings, MD Electronically Signed Final Report   11/08/2023 04:47 pm ----------------------------------------------------------------------   [place] MFM OB FOLLOW UP Result Date: 11/08/2023 ----------------------------------------------------------------------  OBSTETRICS REPORT                       (Signed Final 11/08/2023 04:47 pm) ---------------------------------------------------------------------- Patient Info  ID #:       [id number]                          D.O.B.:  [birth year] (38 yrs)(F)  Name:       [name]                  Visit Date: 11/08/2023 01:21 pm ---------------------------------------------------------------------- Performed By  Attending:        Ma Rings MD         Ref. Address:     [address]                                                             [address]  Performed By:     Dennis Bast RDMS      [facility]  Referred By:      [facility] ---------------------------------------------------------------------- Orders  #  Description                           Code        Ordered By  1  [place] MFM FETAL BPP WO NON               76819.01    YU FANG     STRESS  2  [place] MFM OB FOLLOW UP                   [id number]    YU FANG  ----------------------------------------------------------------------  #  Order #                     Accession #                Episode #  1  [id number]                   [id number]                 [id number]  2  [id number]                   [id number]                 [id number] ---------------------------------------------------------------------- Indications  Hypertension - Chronic/Pre-existing            O10.019  (labetalol)  Gestational diabetes in pregnancy, insulin     O24.414  controlled  Obesity complicating pregnancy, third          O99.213  trimester  Large for gestational age fetus affecting      O36.60X0  management of mother  [redacted] weeks gestation of pregnancy                Z3A.33  Poor obstetric history: Previous gestational   O36.299  diabetes  LR female, neg horizon,  Advanced maternal age multigravida 25+,        O66.523  third trimester (39) ---------------------------------------------------------------------- Vital Signs                                                 Height:        5'5" ---------------------------------------------------------------------- Fetal Evaluation  Num Of Fetuses:         1  Fetal Heart Rate(bpm):  125  Cardiac Activity:       Observed  Presentation:           Cephalic  Placenta:               Anterior  P. Cord Insertion:      Previously seen  Amniotic Fluid  AFI FV:      Within normal limits  AFI Sum(cm)     %Tile       Largest Pocket(cm)  12.44           37          4.14  RUQ(cm)       RLQ(cm)       LUQ(cm)        LLQ(cm)  3.9           3.18          1.22           4.14 ---------------------------------------------------------------------- Biophysical Evaluation  Amniotic F.V:   Within normal limits       F. Tone:        Observed  F. Movement:    Observed  Score:          8/8  F. Breathing:   Observed ---------------------------------------------------------------------- Biometry  BPD:      87.6  mm     G. Age:  35w 3d         90  %    CI:        78.97    %    70 - 86                                                          FL/HC:      20.6   %    19.4 - 21.8  HC:      311.7  mm     G. Age:  34w 6d         47  %    HC/AC:      0.96        0.96 - 1.11  AC:      324.4  mm     G. Age:  36w 2d         99  %    FL/BPD:     73.2   %    71 - 87  FL:       64.1  mm     G. Age:  33w 1d         27  %    FL/AC:      19.8   %    20 - 24  Est. FW:    2634  gm    5 lb 13 oz      89  % ---------------------------------------------------------------------- OB History  Gravidity:    4         Term:   2        Prem:   0        SAB:   1  TOP:          0       Ectopic:  0        Living: 2 ---------------------------------------------------------------------- Gestational Age  LMP:           30w 4d        Date:  04/08/23                  EDD:   01/13/24  U/S Today:     35w 0d                                        EDD:   12/13/23  Best:          33w 4d     Det. By:  U/S C R L  (05/29/23)    EDD:   12/23/23 ---------------------------------------------------------------------- Anatomy  Cranium:               Previously seen        Aortic Arch:            Previously seen  Cavum:                 Previously seen        Ductal Arch:  Previously seen  Ventricles:            Appears normal         Diaphragm:              Appears normal  Choroid Plexus:        Previously seen        Stomach:                Appears normal, left                                                                        sided  Cerebellum:            Previously seen        Abdomen:                Previously seen  Posterior Fossa:       Previously seen        Abdominal Wall:         Previously seen  Face:                  Orbits and profile     Cord Vessels:           Previously seen                         previously seen  Lips:                  Previously seen        Kidneys:                Not well visualized  Thoracic:              Previously seen        Bladder:                Appears normal  Heart:                  Appears normal         Spine:                  Previously seen                         (4CH, axis, and                         situs)  RVOT:                  Previously seen        Upper Extremities:      Previously seen  LVOT:                  Previously seen        Lower Extremities:      Previously seen ---------------------------------------------------------------------- Cervix Uterus Adnexa  Cervix  Not visualized (advanced GA >24wks)  Uterus  No abnormality visualized.  Right Ovary  Not visualized.  Left Ovary  Not visualized.  Cul De Sac  No free fluid seen.  Adnexa  No abnormality visualized ---------------------------------------------------------------------- Comments  This  patient was seen for a follow up growth scan due to  maternal obesity with a BMI of 55, chronic hypertension  treated with nifedipine, and gestational diabetes treated with  insulin.  The patient reports that she felt faint when she got up  today and possibly may be developing a urinary tract  infection.  She reports that her blood pressures and  fingerstick values have been within normal limits.  She was informed that the fetal growth measures large for  her gestational age (89th percentile).  There was normal  amniotic fluid noted.  A BPP performed today was 8 out of 8.  The patient was advised to go to the hospital should she  continue to feel faint.  Otherwise, she should call your office early next week to have  a urinalysis and urine culture performed.  She will return in 1 week for another BPP. ----------------------------------------------------------------------                   Ma Rings, MD Electronically Signed Final Report   11/08/2023 04:47 pm ----------------------------------------------------------------------   [place] MFM FETAL BPP W/NONSTRESS Result Date: 11/01/2023 ----------------------------------------------------------------------  OBSTETRICS REPORT                       (Signed Final 11/01/2023 03:13 pm)  ---------------------------------------------------------------------- Patient Info  ID #:       [id number]                          D.O.B.:  [birth year] (38 yrs)(F)  Name:       [name]                  Visit Date: 11/01/2023 01:37 pm ---------------------------------------------------------------------- Performed By  Attending:        Noralee Space MD        Ref. Address:     [address]                                                             [address]  Performed By:     Emeline Darling BS,      [facility]  Referred By:      [facility] ---------------------------------------------------------------------- Orders  #  Description                           Code        Ordered By  1  [place] MFM FETAL BPP                      13086.5     Rosana Hoes     W/NONSTRESS ----------------------------------------------------------------------  #  Order #                     Accession #                Episode #  1  [id number]                   [id number]                 [id number] ---------------------------------------------------------------------- Indications  Hypertension - Chronic/Pre-existing            O10.019  (labetalol)  Gestational diabetes in pregnancy, insulin     O24.414  controlled  Obesity complicating pregnancy, third          O99.213  trimester  Large for gestational age fetus affecting      O36.60X0  management of mother  Poor obstetric history: Previous gestational   O19.299  diabetes  LR female, neg horizon,  Advanced maternal age multigravida 30+,        O39.523  third trimester (51)  [redacted] weeks gestation of pregnancy                Z3A.32 ---------------------------------------------------------------------- Vital Signs  Weight (lb): 342                                Height:        5'5"  BMI:         56.91  BP:          130/87 ---------------------------------------------------------------------- Fetal Evaluation  Num Of Fetuses:         1  Fetal Heart Rate(bpm):  144  Cardiac Activity:       Observed  Presentation:           Compound  Placenta:               Anterior  P. Cord Insertion:      Previously seen  Amniotic Fluid  AFI FV:      Within normal limits  AFI Sum(cm)     %Tile       Largest Pocket(cm)  11.25           26          4.17  RUQ(cm)       RLQ(cm)       LUQ(cm)        LLQ(cm)  4.17          2.51          1.89           2.68 ---------------------------------------------------------------------- Biophysical Evaluation  Amniotic F.V:   Pocket => 2 cm             F. Tone:        Observed  F. Movement:    Observed  N.S.T:          Reactive  F. Breathing:   Not Observed               Score:          8/10 ---------------------------------------------------------------------- OB History  Gravidity:    4         Term:   2        Prem:   0        SAB:   1  TOP:          0       Ectopic:  0        Living: 2 ---------------------------------------------------------------------- Gestational Age  LMP:           29w 4d        Date:  04/08/23                  EDD:   01/13/24  Best:          Armida Sans 4d     Det. By:  U/S C R L  (05/29/23)    EDD:   12/23/23 ---------------------------------------------------------------------- Anatomy  Diaphragm:             Appears normal         Kidneys:                Appear normal  Stomach:               Appears normal, left   Bladder:                Appears normal                         sided ---------------------------------------------------------------------- Cervix Uterus Adnexa  Cervix  Not visualized (advanced GA >24wks) ---------------------------------------------------------------------- Impression  Amniotic fluid is normal good fetal activity seen.  Fetal  breathing movements did not meet the criteria of BPP.  NST  is  reactive.  BPP 8/10.  We reassured the patient of the findings.  xxxxxxxxxxxxxxxxxxxxxxxxxxxxxxxxxxxxxxxxxxxxxxxxxxxxxxxxx  xxxxxxxxxxxx  Maternal-Fetal Medicine Consultation  I had the pleasure of seeing Ms. Farida Mcreynolds today at [facility]. She is G4 P2012 at 32w 4d  gestation and is here for antenatal testing. Her problems  include:  -Gestational diabetes.  Patient reports that his Dexcom has  dropped out.  Her fasting levels have been high and Lantus  insulin 6 units was started 4 days ago at her prenatal visit.  Patient reports her postprandial levels are within normal  range before starting Lantus insulin.  -Chronic hypertension.  Nifedipine was discontinued and  labetalol 200 mg 3 times daily was started.  Blood pressure  today at our office is 130/87 mmHg.  -Paroxysmal supraventricular tachycardia (PSVT)discovered  on Zio patch.  Patient is being followed by her cardiologist  whose note mentioned that she has rare PSVT.  I discussed the importance of checking her blood glucose  regularly to prevent fetal and neonatal adverse outcomes.  Fasting levels were still high and would like to watch for 2  more days before increasing the dosage of Lantus insulin.  Patient is planning to have dexacom inserted.  I reassured the patient of safety profile of labetalol.  It can be  associated with smaller babies in some cases.  We discussed timing of delivery.  If diabetes is not well-  controlled, early term delivery is appropriate. ---------------------------------------------------------------------- Recommendations  -  Continue weekly BPP till delivery.  -Recommend weekly prenatal visits to address diabetes. ----------------------------------------------------------------------                 Noralee Space, MD Electronically Signed Final Report   11/01/2023 03:13 pm ----------------------------------------------------------------------   [place] MFM FETAL BPP WO NON STRESS Result Date:  10/25/2023 ----------------------------------------------------------------------  OBSTETRICS REPORT                       (Signed Final 10/25/2023 03:44 pm) ---------------------------------------------------------------------- Patient Info  ID #:       [id number]                          D.O.B.:  April 04, 1986 (37 yrs)(F)  Name:       [facility]                  Visit Date: 10/25/2023 02:43 pm ---------------------------------------------------------------------- Performed By  Attending:        Braxton Feathers DO       Ref. Address:     [address]                                                             [address]  Performed By:     Octaviano Batty BS       [facility]  Referred By:      [facility] ---------------------------------------------------------------------- Orders  #  Description                           Code        Ordered By  1  [place] MFM FETAL BPP WO NON               19147.82    YU FANG     STRESS ----------------------------------------------------------------------  #  Order #                     Accession #                Episode #  1  [id number]  [id number]                 [id number] ---------------------------------------------------------------------- Indications  Hypertension - Chronic/Pre-existing            O10.019  (procardia)  Gestational diabetes in pregnancy, diet        O24.410  controlled  Maternal morbid obesity (PG BMI 55.24)         O99.210 E66.01  Large for gestational age fetus affecting      O36.60X0  management of mother  Poor obstetric history: Previous gestational   O55.299  diabetes  LR female, neg horizon,  Advanced maternal age multigravida 62+,        O63.523  third trimester (43)  [redacted] weeks gestation of pregnancy                 Z3A.31 ---------------------------------------------------------------------- Fetal Evaluation  Num Of Fetuses:         1  Fetal Heart Rate(bpm):  157  Cardiac Activity:       Observed  Presentation:           Breech  Placenta:               Anterior  Amniotic Fluid  AFI FV:      Subjectively upper-normal  AFI Sum(cm)     %Tile       Largest Pocket(cm)  23.08           91          6.34  RUQ(cm)       RLQ(cm)       LUQ(cm)        LLQ(cm)  5.1           5.51          6.13           6.34 ---------------------------------------------------------------------- Biophysical Evaluation  Amniotic F.V:   Within normal limits       F. Tone:        Observed  F. Movement:    Observed                   Score:          8/8  F. Breathing:   Observed ---------------------------------------------------------------------- OB History  Gravidity:    4         Term:   2        Prem:   0        SAB:   1  TOP:          0       Ectopic:  0        Living: 2 ---------------------------------------------------------------------- Gestational Age  LMP:           28w 4d        Date:  04/08/23                  EDD:   01/13/24  Best:          31w 4d     Det. By:  U/S C R L  (05/29/23)    EDD:   12/23/23 ---------------------------------------------------------------------- Anatomy  Ventricles:            Appears normal         Stomach:                Appears normal, left  sided  Heart:                 Appears normal         Kidneys:                Previously seen                         (4CH, axis, and                         situs)  Diaphragm:             Previously seen        Bladder:                Previously seen  Other:  A full anatomic study was previously performed. ---------------------------------------------------------------------- Cervix Uterus Adnexa  Cervix  Not visualized (advanced GA >24wks)  Adnexa  No abnormality visualized  ---------------------------------------------------------------------- Comments  MFM Consult Note  Markesia Cassell is doing well today with no acute  concerns. She denies contractions, bleeding, or loss of fluid  and reports good fetal movement.  RE gestational diabetes, diet controlled: Patient reports that  she rarely checks her fasting blood sugar due to a very busy  work schedule.  She is working nearly 17 hours a day.  She  reports that she will attempt to have a consistent week of  checking fasting blood sugars.  She reports the last time she  checked it was about 120.  I discussed that this is out of  range for her goal and this could explain the fetal  macrosomia.  I discussed the concern for intrauterine fetal  hypoxia/stillbirth and that this risk can be reduced with proper  glycemic control.  I also discussed that her postprandial blood  sugars are all very good most of less than 120.  I discussed  that starting NPH 6 units at night could help with her fasting  blood sugar and decrease the fetal size and decrease the  risk of adverse neonatal outcomes.  RE chronic hypertension on medication: Blood pressure is  183/93 today.  She denies any signs or symptoms of  preeclampsia.  She is compliant with her medication.  Sonographic findings  Single intrauterine pregnancy.  Fetal cardiac activity: Observed.  Presentation: Breech.  Interval fetal anatomy appears normal.  Amniotic fluid: Subjectively upper-normal.  MVP: 6.34 cm.  Placenta: Anterior.  BPP: 8/8.  There are limitations of prenatal ultrasound such as the  inability to detect certain abnormalities due to poor  visualization. Various factors such as fetal position,  gestational age and maternal body habitus may increase the  difficulty in visualizing the fetal anatomy.  Recommendations  -Continue weekly antenatal testing  -Growth ultrasounds every 4 weeks  -Consider 6 units of NPH to be given at night prior to sleeping  to target fasting blood sugar if  they remain persistently  elevated over 95  -Delivery timing likely around 37 to 38 weeks ----------------------------------------------------------------------                  Braxton Feathers, DO Electronically Signed Final Report   10/25/2023 03:44 pm ----------------------------------------------------------------------    MAU Course/MDM: I have reviewed the triage vital signs and the nursing notes.   Pertinent labs & imaging results that were available during my care of the patient were reviewed by me and considered in my medical decision making (see chart for details).  I have reviewed her medical records including past results, notes and treatments.   I have ordered labs and reviewed results.  NST reviewed  Treatments in MAU included serial BP, NST, CBC, CMP, Urine pro/cr.    Assessment: 1. Supervision of high risk pregnancy, antepartum   2. Insulin controlled gestational diabetes mellitus (GDM) during pregnancy, antepartum   3. Preexisting hypertension complicating pregnancy, antepartum   Ddx orthostatic hypotension, vasovagal syncope, seizures, cardiac arrhythmias, hypoglycemia, and anemia  Symptoms most consistent with orthostatic hypotension  Known hx of PSVT; which typically presents with palpitations which pt denied today Hb 11.8 Dexcom reviewed no hypoglycemia  Episode happen 1-2 hours after labetalol, possible correlation    Plan: Discharge home Recommend slow transitions, compression stockings, adequate hydration and nutrition Labor precautions and fetal kick counts Follow up in Office for prenatal visits and recheck - scheduled with Cardiology 2/26 and OB 2/24   Pt stable at time of discharge.  Allergies as of 11/15/2023       Reactions   Latex Itching, Rash        Medication List     TAKE these medications    albuterol 108 (90 Base) MCG/ACT inhaler Commonly known as: VENTOLIN HFA Inhale 1 puff into the lungs every 6 (six) hours as needed for  wheezing or shortness of breath.   aspirin EC 81 MG tablet Take 2 tablets (162 mg total) by mouth at bedtime. Start taking when you are [redacted] weeks pregnant for rest of pregnancy for prevention of preeclampsia   Dexcom G6 Sensor Misc 1 Package by Does not apply route every 30 (thirty) days.   Dexcom G6 Transmitter Misc 1 Package by Does not apply route every 3 (three) months.   furosemide 40 MG tablet Commonly known as: LASIX Take 1 tablet (40 mg total) by mouth daily for 3 days.   insulin glargine 100 UNIT/ML Solostar Pen Commonly known as: LANTUS Inject 6 Units into the skin at bedtime.   labetalol 200 MG tablet Commonly known as: NORMODYNE Take 1 tablet (200 mg total) by mouth 3 (three) times daily.   ondansetron 4 MG disintegrating tablet Commonly known as: ZOFRAN-ODT Take 1 tablet (4 mg total) by mouth every 6 (six) hours as needed for nausea.   Pen Needles 33G X 4 MM Misc Please use as directed   potassium chloride SA 20 MEQ tablet Commonly known as: Klor-Con M20 Take 1 tablet (20 mEq total) by mouth daily for 3 days.   prenatal multivitamin Tabs tablet Take 1 tablet by mouth daily at 12 noon.        Wyn Forster, MD FMOB Fellow, Faculty practice [facility], [facility]  11/15/2023 3:28 PM

## 2023-11-18 ENCOUNTER — Encounter: Payer: 59 | Admitting: Obstetrics & Gynecology

## 2023-11-20 ENCOUNTER — Telehealth: Payer: Self-pay

## 2023-11-20 ENCOUNTER — Ambulatory Visit: Payer: 59 | Admitting: Cardiology

## 2023-11-20 NOTE — Telephone Encounter (Signed)
 Spoke with pt. She states she was not been able to take the Labetalol 300 mg at one time, "it makes my blood pressure do something funny."  Pt states she has been taking 200 mg twice daily sometimes 3 times daily. Requested pt take her blood pressure daily and sent it in a MyChart message Friday for Dr. Servando Salina or Iran Planas to review. She verbalized understanding. No further questions at this time.

## 2023-11-21 ENCOUNTER — Ambulatory Visit (INDEPENDENT_AMBULATORY_CARE_PROVIDER_SITE_OTHER): Payer: 59 | Admitting: Obstetrics and Gynecology

## 2023-11-21 ENCOUNTER — Telehealth (HOSPITAL_COMMUNITY): Payer: Self-pay | Admitting: *Deleted

## 2023-11-21 ENCOUNTER — Encounter (HOSPITAL_COMMUNITY): Payer: Self-pay

## 2023-11-21 ENCOUNTER — Other Ambulatory Visit (HOSPITAL_COMMUNITY)
Admission: RE | Admit: 2023-11-21 | Discharge: 2023-11-21 | Disposition: A | Discharge status: Home / Self Care | Source: Ambulatory Visit | Attending: Obstetrics and Gynecology | Admitting: Obstetrics and Gynecology

## 2023-11-21 VITALS — BP 144/84 | HR 83 | Wt 345.0 lb

## 2023-11-21 DIAGNOSIS — O10919 Unspecified pre-existing hypertension complicating pregnancy, unspecified trimester: Secondary | ICD-10-CM | POA: Diagnosis not present

## 2023-11-21 DIAGNOSIS — O24414 Gestational diabetes mellitus in pregnancy, insulin controlled: Secondary | ICD-10-CM

## 2023-11-21 DIAGNOSIS — O3663X0 Maternal care for excessive fetal growth, third trimester, not applicable or unspecified: Secondary | ICD-10-CM

## 2023-11-21 DIAGNOSIS — N898 Other specified noninflammatory disorders of vagina: Secondary | ICD-10-CM | POA: Diagnosis present

## 2023-11-21 DIAGNOSIS — O09523 Supervision of elderly multigravida, third trimester: Secondary | ICD-10-CM

## 2023-11-21 DIAGNOSIS — Z3A35 35 weeks gestation of pregnancy: Secondary | ICD-10-CM | POA: Insufficient documentation

## 2023-11-21 DIAGNOSIS — I471 Supraventricular tachycardia, unspecified: Secondary | ICD-10-CM | POA: Diagnosis not present

## 2023-11-21 DIAGNOSIS — O26893 Other specified pregnancy related conditions, third trimester: Secondary | ICD-10-CM

## 2023-11-21 DIAGNOSIS — O99210 Obesity complicating pregnancy, unspecified trimester: Secondary | ICD-10-CM

## 2023-11-21 DIAGNOSIS — Z6841 Body Mass Index (BMI) 40.0 and over, adult: Secondary | ICD-10-CM

## 2023-11-21 DIAGNOSIS — O0990 Supervision of high risk pregnancy, unspecified, unspecified trimester: Secondary | ICD-10-CM

## 2023-11-21 NOTE — Telephone Encounter (Signed)
 Preadmission screen

## 2023-11-21 NOTE — Progress Notes (Signed)
 PRENATAL VISIT NOTE  Subjective:  Jane Jackson is a 38 y.o. G2X5284 at [redacted]w[redacted]d being seen today for ongoing prenatal care.  She is currently monitored for the following issues for this high-risk pregnancy and has Maternal morbid obesity, antepartum (HCC); Uncomplicated asthma; Supervision of high risk pregnancy, antepartum; BMI 50.0-59.9, adult (HCC); Preexisting hypertension complicating pregnancy, antepartum; Gestational diabetes mellitus, antepartum; Excessive fetal growth affecting management of mother in third trimester, antepartum; PSVT (paroxysmal supraventricular tachycardia) (HCC); and AMA (advanced maternal age) multigravida 35+, third trimester on their problem list.  Patient reports  lethargy .  Contractions: Irritability. Vag. Bleeding: None.  Movement: Present. Denies leaking of fluid.   The following portions of the patient's history were reviewed and updated as appropriate: allergies, current medications, past family history, past medical history, past social history, past surgical history and problem list.   Objective:   Vitals:   11/21/23 1440 11/21/23 1451  BP: (!) 142/111 (!) 144/84  Pulse: 85 83  Weight: (!) 345 lb (156.5 kg)     Fetal Status: Fetal Heart Rate (bpm): 134   Movement: Present     General:  Alert, oriented and cooperative. Patient is in no acute distress.  Skin: Skin is warm and dry. No rash noted.   Cardiovascular: Normal heart rate noted  Respiratory: Normal respiratory effort, no problems with respiration noted  Abdomen: Soft, gravid, appropriate for gestational age.  Pain/Pressure: Present     Pelvic: Cervical exam deferred        Extremities: Normal range of motion.  Edema: Moderate pitting, indentation subsides rapidly  Mental Status: Normal mood and affect. Normal behavior. Normal judgment and thought content.   Assessment and Plan:  Pregnancy: [id number] at [redacted]w[redacted]d 1. Vaginal discharge during pregnancy in third trimester (Primary) - Strep Gp B  NAA - Cervicovaginal ancillary only( [place])  2. [redacted] weeks gestation of pregnancy - Strep Gp B NAA - Cervicovaginal ancillary only( [place])  3. PSVT (paroxysmal supraventricular tachycardia) (HCC) On labetalol 200 bid and states that she feels like her MAU s/s were doing to getting used to the dose adjustment. She states she is taking it three times a day and not twice a day. I told her the medicine is best every 8 hours and it sounds like her MAU s/s were due to taking it more frequently. I also told her I'd recommend increasing her medicine but will hold it at the current dosage for now as she hadn't taken her 2nd med yet today and she seems to be sensitive to dose adjustments.  Follow up weekly testing tomorrow She is set up for 37/3 IOL but patient requests to move it to 38/3 due to wanting her mom to help with childcare. I told her I strongly advise against this as the longer she stays pregnant the higher the risk of maternal fetal compromise and she is already showing affects with her increasing BP. Patient understading. IOL changed to 38/3.   4. Preexisting hypertension complicating pregnancy, antepartum See above. Followed by OB cards  5. Supervision of high risk pregnancy, antepartum  6. Maternal morbid obesity, antepartum (HCC) Weight stable  7. BMI 50.0-59.9, adult (HCC)  8. Excessive fetal growth affecting management of pregnancy in third trimester, single or unspecified fetus 2/14: 89%, 2634gm, ac 99%, afi 12.4 cephalic  9. AMA (advanced maternal age) multigravida 35+, third trimester  12. Insulin controlled gestational diabetes mellitus (GDM) during pregnancy, antepartum Patient reports normal CBGs with lantus 6 qhs  Preterm labor symptoms  and general obstetric precautions including but not limited to vaginal bleeding, contractions, leaking of fluid and fetal movement were reviewed in detail with the patient. Please refer to After Visit Summary for other  counseling recommendations.   No follow-ups on file.  Future Appointments  Date Time Provider Department Center  11/22/2023  1:15 PM [facility] NURSE [facility]  11/22/2023  1:30 PM [facility] US2 [facility]  11/25/2023  3:30 PM Anyanwu, Jethro Bastos, MD [facility]  11/28/2023  4:00 PM Thomasene Ripple, DO [facility] None  11/29/2023  1:15 PM [facility] NURSE [facility]  11/29/2023  1:30 PM [facility] US2 [facility]  12/02/2023  3:30 PM Federico Flake, MD [facility]  12/12/2023  6:45 AM [facility] SCHED ROOM [facility] None    [place], MD

## 2023-11-21 NOTE — Progress Notes (Signed)
 ROB   Pt notes not able to take labetalol 300mg  3 times a day.  Needs to discuss Induction date. Will need Mother present.

## 2023-11-22 ENCOUNTER — Encounter: Payer: Self-pay | Admitting: *Deleted

## 2023-11-22 ENCOUNTER — Ambulatory Visit: Payer: Medicaid Other | Admitting: *Deleted

## 2023-11-22 ENCOUNTER — Ambulatory Visit: Payer: Medicaid Other | Attending: Obstetrics

## 2023-11-22 ENCOUNTER — Ambulatory Visit: Payer: 59 | Attending: Obstetrics and Gynecology | Admitting: Obstetrics and Gynecology

## 2023-11-22 ENCOUNTER — Other Ambulatory Visit: Payer: Self-pay | Admitting: *Deleted

## 2023-11-22 VITALS — BP 122/74 | HR 95

## 2023-11-22 DIAGNOSIS — E669 Obesity, unspecified: Secondary | ICD-10-CM | POA: Diagnosis not present

## 2023-11-22 DIAGNOSIS — O10913 Unspecified pre-existing hypertension complicating pregnancy, third trimester: Secondary | ICD-10-CM | POA: Diagnosis present

## 2023-11-22 DIAGNOSIS — E6689 Other obesity not elsewhere classified: Secondary | ICD-10-CM

## 2023-11-22 DIAGNOSIS — O99213 Obesity complicating pregnancy, third trimester: Secondary | ICD-10-CM | POA: Diagnosis present

## 2023-11-22 DIAGNOSIS — O24414 Gestational diabetes mellitus in pregnancy, insulin controlled: Secondary | ICD-10-CM | POA: Diagnosis present

## 2023-11-22 DIAGNOSIS — O0990 Supervision of high risk pregnancy, unspecified, unspecified trimester: Secondary | ICD-10-CM | POA: Insufficient documentation

## 2023-11-22 DIAGNOSIS — Z6841 Body Mass Index (BMI) 40.0 and over, adult: Secondary | ICD-10-CM | POA: Insufficient documentation

## 2023-11-22 DIAGNOSIS — O24419 Gestational diabetes mellitus in pregnancy, unspecified control: Secondary | ICD-10-CM | POA: Diagnosis present

## 2023-11-22 DIAGNOSIS — O3663X0 Maternal care for excessive fetal growth, third trimester, not applicable or unspecified: Secondary | ICD-10-CM

## 2023-11-22 DIAGNOSIS — O09523 Supervision of elderly multigravida, third trimester: Secondary | ICD-10-CM

## 2023-11-22 DIAGNOSIS — Z3A35 35 weeks gestation of pregnancy: Secondary | ICD-10-CM | POA: Diagnosis not present

## 2023-11-22 DIAGNOSIS — O10013 Pre-existing essential hypertension complicating pregnancy, third trimester: Secondary | ICD-10-CM

## 2023-11-22 DIAGNOSIS — O10919 Unspecified pre-existing hypertension complicating pregnancy, unspecified trimester: Secondary | ICD-10-CM

## 2023-11-22 DIAGNOSIS — O09293 Supervision of pregnancy with other poor reproductive or obstetric history, third trimester: Secondary | ICD-10-CM

## 2023-11-22 NOTE — Progress Notes (Signed)
 Maternal-Fetal Medicine Consultation I had the pleasure of seeing Jane Jackson today at the [facility]. She is G4 P2012 at 35w 4d gestation and is here for antenatal testing.  Her high risk problems include: Gestational diabetes.  Patient takes Lantus insulin 6 units at night.  She reports her occasional fasting levels are greater than 100 mg/dL.  Postprandial levels are reportedly within normal range. Chronic hypertension.  Patient takes labetalol 200 mg 3 times daily.  Blood pressure today at our office is 122/74 mmHg.  She does not have signs and symptoms of severe features of preeclampsia.  Patient reports that her induction of labor date is moved to 12/12/2022 to get more domestic help.  Ultrasound Amniotic fluid is normal good fetal activity seen.  Cephalic presentation.  Antenatal testing is reassuring.  BPP 8/8.  I reassured the patient of the findings.  I discussed blood glucose parameters and encouraged her to increase Lantus insulin to 10 units at night. Hypertension is well-controlled with current doses of labetalol. We recommend fetal growth assessment in 2 weeks before induction of labor.  Recommendations -BPP next week. -Fetal growth assessment and BPP in 2 weeks. -Increase Lantus insulin to 10 units at night.   Consultation including face-to-face (more than 50%) counseling 15 minutes.

## 2023-11-23 LAB — STREP GP B NAA: Strep Gp B NAA: NEGATIVE

## 2023-11-24 LAB — CERVICOVAGINAL ANCILLARY ONLY
Bacterial Vaginitis (gardnerella): NEGATIVE
Candida Glabrata: NEGATIVE
Candida Vaginitis: NEGATIVE
Chlamydia: NEGATIVE
Comment: NEGATIVE
Comment: NEGATIVE
Comment: NEGATIVE
Comment: NEGATIVE
Comment: NEGATIVE
Comment: NORMAL
Neisseria Gonorrhea: NEGATIVE
Trichomonas: NEGATIVE

## 2023-11-25 ENCOUNTER — Ambulatory Visit (INDEPENDENT_AMBULATORY_CARE_PROVIDER_SITE_OTHER): Payer: Medicaid Other | Admitting: Obstetrics & Gynecology

## 2023-11-25 ENCOUNTER — Encounter: Payer: Self-pay | Admitting: Obstetrics & Gynecology

## 2023-11-25 VITALS — BP 165/94 | HR 94 | Wt 352.0 lb

## 2023-11-25 DIAGNOSIS — Z3A36 36 weeks gestation of pregnancy: Secondary | ICD-10-CM | POA: Diagnosis not present

## 2023-11-25 DIAGNOSIS — O0990 Supervision of high risk pregnancy, unspecified, unspecified trimester: Secondary | ICD-10-CM

## 2023-11-25 DIAGNOSIS — O10913 Unspecified pre-existing hypertension complicating pregnancy, third trimester: Secondary | ICD-10-CM | POA: Diagnosis not present

## 2023-11-25 DIAGNOSIS — O10919 Unspecified pre-existing hypertension complicating pregnancy, unspecified trimester: Secondary | ICD-10-CM

## 2023-11-25 DIAGNOSIS — O3663X0 Maternal care for excessive fetal growth, third trimester, not applicable or unspecified: Secondary | ICD-10-CM

## 2023-11-25 DIAGNOSIS — O24414 Gestational diabetes mellitus in pregnancy, insulin controlled: Secondary | ICD-10-CM | POA: Diagnosis not present

## 2023-11-25 NOTE — Patient Instructions (Signed)

## 2023-11-25 NOTE — Progress Notes (Signed)
 PRENATAL VISIT NOTE  Subjective:  Jane Jackson is a 38 y.o. L2G4010 at [redacted]w[redacted]d being seen today for ongoing prenatal care.  She is currently monitored for the following issues for this high-risk pregnancy and has Maternal morbid obesity, antepartum (HCC); Uncomplicated asthma; Supervision of high risk pregnancy, antepartum; BMI 50.0-59.9, adult (HCC); Preexisting hypertension complicating pregnancy, antepartum; Gestational diabetes mellitus, antepartum; Excessive fetal growth affecting management of mother in third trimester, antepartum; PSVT (paroxysmal supraventricular tachycardia) (HCC); and AMA (advanced maternal age) multigravida 35+, third trimester on their problem list.  Patient reports no complaints.  Patient denies any headaches, visual symptoms, RUQ/epigastric pain or other concerning symptoms.   Contractions: Irritability. Vag. Bleeding: None.  Movement: Present. Denies leaking of fluid.   The following portions of the patient's history were reviewed and updated as appropriate: allergies, current medications, past family history, past medical history, past social history, past surgical history and problem list.   Objective:   Vitals:   11/25/23 1549 11/25/23 1609  BP: (!) 164/98 (!) 165/94  Pulse: 94   Weight: (!) 352 lb (159.7 kg)   Patient reports that she did not take medication, BP is usually this high before medication.  Fetal Status: Fetal Heart Rate (bpm): 142   Movement: Present     General:  Alert, oriented and cooperative. Patient is in no acute distress.  Skin: Skin is warm and dry. No rash noted.   Cardiovascular: Normal heart rate noted  Respiratory: Normal respiratory effort, no problems with respiration noted  Abdomen: Soft, gravid, appropriate for gestational age.  Pain/Pressure: Absent     Pelvic: Cervical exam deferred        Extremities: Normal range of motion.  Edema: Trace  Mental Status: Normal mood and affect. Normal behavior. Normal judgment and  thought content.   [place] MFM FETAL BPP WO NON STRESS Result Date: 11/22/2023 ----------------------------------------------------------------------  OBSTETRICS REPORT                       (Signed Final 11/22/2023 02:10 pm) ---------------------------------------------------------------------- Patient Info  ID #:       [id number]                          D.O.B.:  Aug 19, 1986 (38 yrs)(F)  Name:       Jane Jackson                  Visit Date: 11/22/2023 01:22 pm ---------------------------------------------------------------------- Performed By  Attending:        Noralee Space MD        Ref. Address:     [address]                                                             [address]  Performed By:     Marcellina Millin       [facility]  Referred By:      [facility] ---------------------------------------------------------------------- Orders  #  Description                           Code        Ordered By  1  [place] MFM FETAL BPP WO NON               [phone]    YU FANG     STRESS ----------------------------------------------------------------------  #  Order #                     Accession #                Episode #  1  [id number]                   [id number]                 [id number] ---------------------------------------------------------------------- Indications  Hypertension - Chronic/Pre-existing            O10.019  (labetalol)  Gestational diabetes in pregnancy, insulin     O24.414  controlled  Obesity complicating pregnancy, third          O99.213  trimester  Large for gestational age fetus affecting      O36.60X0  management of mother  Poor obstetric history: Previous gestational   O72.299  diabetes  Advanced maternal age multigravida 44+,        O45.523  third trimester  (31)  [redacted] weeks gestation of pregnancy                Z3A.35  LR female, neg horizon ---------------------------------------------------------------------- Vital Signs                                                 Height:        5'5"  BP:          122/74 ---------------------------------------------------------------------- Fetal Evaluation  Num Of Fetuses:         1  Fetal Heart Rate(bpm):  133  Cardiac Activity:       Observed  Presentation:           Cephalic  Placenta:               Anterior  P. Cord Insertion:      Previously seen  Amniotic Fluid  AFI FV:      Within normal limits  AFI Sum(cm)     %Tile       Largest Pocket(cm)  8.29  9           2.73  RUQ(cm)       RLQ(cm)       LUQ(cm)        LLQ(cm)  2.43          2.38          2.73           0.75 ---------------------------------------------------------------------- Biophysical Evaluation  Amniotic F.V:   Pocket => 2 cm             F. Tone:        Observed  F. Movement:    Observed                   Score:          8/8  F. Breathing:   Observed ---------------------------------------------------------------------- OB History  Gravidity:    4         Term:   2        Prem:   0        SAB:   1  TOP:          0       Ectopic:  0        Living: 2 ---------------------------------------------------------------------- Gestational Age  LMP:           32w 4d        Date:  04/08/23                  EDD:   01/13/24  Best:          Consuello Closs 4d     Det. By:  U/S C R L  (05/29/23)    EDD:   12/23/23 ---------------------------------------------------------------------- Impression  Amniotic fluid is normal good fetal activity seen.  Cephalic  presentation.  Antenatal testing is reassuring.  BPP 8/8.  xxxxxxxxxxxxxxxxxxxxxxxxxxxxxxxxxxxxxxxxxxxxxxxxxxxxxxxxx  xx  Maternal-Fetal Medicine Consultation  I had the pleasure of seeing Ms. Esly Selvage today at [facility]. She is G4 P2012 at 33w 4d  gestation and is here for antenatal testing.  Her  high risk  problems include:  Gestational diabetes.  Patient takes Lantus insulin 6 units at  night.  She reports her occasional fasting levels are greater  than 100 mg/dL.  Postprandial levels are reportedly within  normal range.  Chronic hypertension.  Patient takes labetalol 200 mg 3 times  daily.  Blood pressure today at our office is 122/74 mmHg.  She does not have signs and symptoms of severe features of  preeclampsia.  Patient reports that her induction of labor date is moved to  12/12/2022 to get more domestic help.  I reassured the patient of the findings.  I discussed blood glucose parameters and encouraged her to  increase Lantus insulin to 10 units at night.  Hypertension is well-controlled with current doses of labetalol.  We recommend fetal growth assessment in 2 weeks before  induction of labor. ---------------------------------------------------------------------- Recommendations  -BPP next week.  -Fetal growth assessment and BPP in 2 weeks.  -Increase Lantus insulin to 10 units at night. ----------------------------------------------------------------------                 Noralee Space, MD Electronically Signed Final Report   11/22/2023 02:10 pm ----------------------------------------------------------------------   [place] MFM FETAL BPP WO NON STRESS Result Date: 11/08/2023 ----------------------------------------------------------------------  OBSTETRICS REPORT                       (  Signed Final 11/08/2023 04:47 pm) ---------------------------------------------------------------------- Patient Info  ID #:       [id number]                          D.O.B.:  1986-06-01 (38 yrs)(F)  Name:       [facility]                  Visit Date: 11/08/2023 01:21 pm ---------------------------------------------------------------------- Performed By  Attending:        Ma Rings MD         Ref. Address:     [address]                                                             [address]  Performed By:     Dennis Bast RDMS      [facility]  Referred By:      [facility] ---------------------------------------------------------------------- Orders  #  Description                           Code        Ordered By  1  [place] MFM FETAL BPP WO NON               76819.01    YU FANG     STRESS  2  [place] MFM OB FOLLOW UP                   [id number]    YU FANG ----------------------------------------------------------------------  #  Order #                     Accession #                Episode #  1  [id number]                   [id number]                 [id number]  2  [id number]  [id number]                 [id number] ---------------------------------------------------------------------- Indications  Hypertension - Chronic/Pre-existing            O10.019  (labetalol)  Gestational diabetes in pregnancy, insulin     O24.414  controlled  Obesity complicating pregnancy, third          O99.213  trimester  Large for gestational age fetus affecting      O36.60X0  management of mother  100 weeks gestation of pregnancy                Z3A.33  Poor obstetric history: Previous gestational   O82.299  diabetes  LR female, neg horizon,  Advanced maternal age multigravida 20+,        O19.523  third trimester (56) ---------------------------------------------------------------------- Vital Signs                                                 Height:        5'5" ---------------------------------------------------------------------- Fetal Evaluation  Num Of Fetuses:         1  Fetal Heart Rate(bpm):  125  Cardiac Activity:       Observed  Presentation:           Cephalic  Placenta:               Anterior  P. Cord Insertion:      Previously seen  Amniotic Fluid  AFI FV:      Within normal limits  AFI  Sum(cm)     %Tile       Largest Pocket(cm)  12.44           37          4.14  RUQ(cm)       RLQ(cm)       LUQ(cm)        LLQ(cm)  3.9           3.18          1.22           4.14 ---------------------------------------------------------------------- Biophysical Evaluation  Amniotic F.V:   Within normal limits       F. Tone:        Observed  F. Movement:    Observed                   Score:          8/8  F. Breathing:   Observed ---------------------------------------------------------------------- Biometry  BPD:      87.6  mm     G. Age:  35w 3d         90  %    CI:        78.97   %    70 - 86                                                          FL/HC:      20.6   %    19.4 - 21.8  HC:      311.7  mm     G. Age:  34w 6d  47  %    HC/AC:      0.96        0.96 - 1.11  AC:      324.4  mm     G. Age:  36w 2d         99  %    FL/BPD:     73.2   %    71 - 87  FL:       64.1  mm     G. Age:  33w 1d         27  %    FL/AC:      19.8   %    20 - 24  Est. FW:    2634  gm    5 lb 13 oz      89  % ---------------------------------------------------------------------- OB History  Gravidity:    4         Term:   2        Prem:   0        SAB:   1  TOP:          0       Ectopic:  0        Living: 2 ---------------------------------------------------------------------- Gestational Age  LMP:           30w 4d        Date:  04/08/23                  EDD:   01/13/24  U/S Today:     35w 0d                                        EDD:   12/13/23  Best:          33w 4d     Det. By:  U/S C R L  (05/29/23)    EDD:   12/23/23 ---------------------------------------------------------------------- Anatomy  Cranium:               Previously seen        Aortic Arch:            Previously seen  Cavum:                 Previously seen        Ductal Arch:            Previously seen  Ventricles:            Appears normal         Diaphragm:              Appears normal  Choroid Plexus:        Previously seen        Stomach:                Appears  normal, left                                                                        sided  Cerebellum:            Previously seen  Abdomen:                Previously seen  Posterior Fossa:       Previously seen        Abdominal Wall:         Previously seen  Face:                  Orbits and profile     Cord Vessels:           Previously seen                         previously seen  Lips:                  Previously seen        Kidneys:                Not well visualized  Thoracic:              Previously seen        Bladder:                Appears normal  Heart:                 Appears normal         Spine:                  Previously seen                         (4CH, axis, and                         situs)  RVOT:                  Previously seen        Upper Extremities:      Previously seen  LVOT:                  Previously seen        Lower Extremities:      Previously seen ---------------------------------------------------------------------- Cervix Uterus Adnexa  Cervix  Not visualized (advanced GA >24wks)  Uterus  No abnormality visualized.  Right Ovary  Not visualized.  Left Ovary  Not visualized.  Cul De Sac  No free fluid seen.  Adnexa  No abnormality visualized ---------------------------------------------------------------------- Comments  This patient was seen for a follow up growth scan due to  maternal obesity with a BMI of 55, chronic hypertension  treated with nifedipine, and gestational diabetes treated with  insulin.  The patient reports that she felt faint when she got up  today and possibly may be developing a urinary tract  infection.  She reports that her blood pressures and  fingerstick values have been within normal limits.  She was informed that the fetal growth measures large for  her gestational age (89th percentile).  There was normal  amniotic fluid noted.  A BPP performed today was 8 out of 8.  The patient was advised to go to the hospital should she  continue to feel faint.   Otherwise, she should call your office early next week to have  a urinalysis and urine culture performed.  She will return in 1 week for another BPP. ----------------------------------------------------------------------                   Alecia Lemming  Parke Poisson, MD Electronically Signed Final Report   11/08/2023 04:47 pm ----------------------------------------------------------------------   [place] MFM OB FOLLOW UP Result Date: 11/08/2023 ----------------------------------------------------------------------  OBSTETRICS REPORT                       (Signed Final 11/08/2023 04:47 pm) ---------------------------------------------------------------------- Patient Info  ID #:       [id number]                          D.O.B.:  1986/03/11 (38 yrs)(F)  Name:       Jane Jackson                  Visit Date: 11/08/2023 01:21 pm ---------------------------------------------------------------------- Performed By  Attending:        Ma Rings MD         Ref. Address:     [address]                                                             [address]  Performed By:     Dennis Bast RDMS      [facility]  Referred By:      [facility] ---------------------------------------------------------------------- Orders  #  Description                           Code        Ordered By  1  [place] MFM FETAL BPP WO NON               76819.01    YU FANG     STRESS  2  [place] MFM OB FOLLOW UP                   40981.19    YU FANG ----------------------------------------------------------------------  #  Order #                     Accession #                Episode #  1  [id number]                   [id number]                 [id number]  2  [id number]                   [id number]                 [id number]  ---------------------------------------------------------------------- Indications  Hypertension - Chronic/Pre-existing            O10.019  (labetalol)  Gestational diabetes in pregnancy, insulin     O24.414  controlled  Obesity complicating pregnancy, third          O99.213  trimester  Large for gestational age fetus affecting      O36.60X0  management of mother  [redacted] weeks gestation of pregnancy                Z3A.33  Poor obstetric history: Previous gestational   O62.299  diabetes  LR female, neg horizon,  Advanced maternal age multigravida 57+,        O34.523  third trimester (37) ---------------------------------------------------------------------- Vital Signs                                                 Height:        5'5" ---------------------------------------------------------------------- Fetal Evaluation  Num Of Fetuses:         1  Fetal Heart Rate(bpm):  125  Cardiac Activity:       Observed  Presentation:           Cephalic  Placenta:               Anterior  P. Cord Insertion:      Previously seen  Amniotic Fluid  AFI FV:      Within normal limits  AFI Sum(cm)     %Tile       Largest Pocket(cm)  12.44           37          4.14  RUQ(cm)       RLQ(cm)       LUQ(cm)        LLQ(cm)  3.9           3.18          1.22           4.14 ---------------------------------------------------------------------- Biophysical Evaluation  Amniotic F.V:   Within normal limits       F. Tone:        Observed  F. Movement:    Observed                   Score:          8/8  F. Breathing:   Observed ---------------------------------------------------------------------- Biometry  BPD:      87.6  mm     G. Age:  35w 3d         90  %    CI:        78.97   %    70 - 86  FL/HC:      20.6   %    19.4 - 21.8  HC:      311.7  mm     G. Age:  34w 6d         47  %    HC/AC:      0.96        0.96 - 1.11  AC:      324.4  mm     G. Age:  36w 2d         99  %    FL/BPD:     73.2   %     71 - 87  FL:       64.1  mm     G. Age:  33w 1d         27  %    FL/AC:      19.8   %    20 - 24  Est. FW:    2634  gm    5 lb 13 oz      89  % ---------------------------------------------------------------------- OB History  Gravidity:    4         Term:   2        Prem:   0        SAB:   1  TOP:          0       Ectopic:  0        Living: 2 ---------------------------------------------------------------------- Gestational Age  LMP:           30w 4d        Date:  04/08/23                  EDD:   01/13/24  U/S Today:     35w 0d                                        EDD:   12/13/23  Best:          33w 4d     Det. By:  U/S C R L  (05/29/23)    EDD:   12/23/23 ---------------------------------------------------------------------- Anatomy  Cranium:               Previously seen        Aortic Arch:            Previously seen  Cavum:                 Previously seen        Ductal Arch:            Previously seen  Ventricles:            Appears normal         Diaphragm:              Appears normal  Choroid Plexus:        Previously seen        Stomach:                Appears normal, left  sided  Cerebellum:            Previously seen        Abdomen:                Previously seen  Posterior Fossa:       Previously seen        Abdominal Wall:         Previously seen  Face:                  Orbits and profile     Cord Vessels:           Previously seen                         previously seen  Lips:                  Previously seen        Kidneys:                Not well visualized  Thoracic:              Previously seen        Bladder:                Appears normal  Heart:                 Appears normal         Spine:                  Previously seen                         (4CH, axis, and                         situs)  RVOT:                  Previously seen        Upper Extremities:      Previously seen  LVOT:                  Previously seen        Lower  Extremities:      Previously seen ---------------------------------------------------------------------- Cervix Uterus Adnexa  Cervix  Not visualized (advanced GA >24wks)  Uterus  No abnormality visualized.  Right Ovary  Not visualized.  Left Ovary  Not visualized.  Cul De Sac  No free fluid seen.  Adnexa  No abnormality visualized ---------------------------------------------------------------------- Comments  This patient was seen for a follow up growth scan due to  maternal obesity with a BMI of 55, chronic hypertension  treated with nifedipine, and gestational diabetes treated with  insulin.  The patient reports that she felt faint when she got up  today and possibly may be developing a urinary tract  infection.  She reports that her blood pressures and  fingerstick values have been within normal limits.  She was informed that the fetal growth measures large for  her gestational age (89th percentile).  There was normal  amniotic fluid noted.  A BPP performed today was 8 out of 8.  The patient was advised to go to the hospital should she  continue to feel faint.  Otherwise, she should call your office early next week to have  a urinalysis and urine culture performed.  She will return in 1 week  for another BPP. ----------------------------------------------------------------------                   Ma Rings, MD Electronically Signed Final Report   11/08/2023 04:47 pm ----------------------------------------------------------------------   [place] MFM FETAL BPP W/NONSTRESS Result Date: 11/01/2023 ----------------------------------------------------------------------  OBSTETRICS REPORT                       (Signed Final 11/01/2023 03:13 pm) ---------------------------------------------------------------------- Patient Info  ID #:       [id number]                          D.O.B.:  1986/02/04 (38 yrs)(F)  Name:       Jane Jackson                  Visit Date: 11/01/2023 01:37 pm  ---------------------------------------------------------------------- Performed By  Attending:        Noralee Space MD        Ref. Address:     [address]                                                             [address]  Performed By:     Emeline Darling BS,      [facility]  Referred By:      [facility] ---------------------------------------------------------------------- Orders  #  Description                           Code        Ordered By  1  [place] MFM FETAL BPP                      10272.5     Rosana Hoes     W/NONSTRESS ----------------------------------------------------------------------  #  Order #                     Accession #                Episode #  1  [id number]  [id number]                 [id number] ---------------------------------------------------------------------- Indications  Hypertension - Chronic/Pre-existing            O10.019  (labetalol)  Gestational diabetes in pregnancy, insulin     O24.414  controlled  Obesity complicating pregnancy, third          O99.213  trimester  Large for gestational age fetus affecting      O36.60X0  management of mother  Poor obstetric history: Previous gestational   O1.299  diabetes  LR female, neg horizon,  Advanced maternal age multigravida 57+,        O56.523  third trimester (75)  [redacted] weeks gestation of pregnancy                Z3A.32 ---------------------------------------------------------------------- Vital Signs  Weight (lb): 342                               Height:        5'5"  BMI:         56.91  BP:          130/87 ---------------------------------------------------------------------- Fetal Evaluation  Num Of Fetuses:         1  Fetal Heart Rate(bpm):  144  Cardiac Activity:       Observed   Presentation:           Compound  Placenta:               Anterior  P. Cord Insertion:      Previously seen  Amniotic Fluid  AFI FV:      Within normal limits  AFI Sum(cm)     %Tile       Largest Pocket(cm)  11.25           26          4.17  RUQ(cm)       RLQ(cm)       LUQ(cm)        LLQ(cm)  4.17          2.51          1.89           2.68 ---------------------------------------------------------------------- Biophysical Evaluation  Amniotic F.V:   Pocket => 2 cm             F. Tone:        Observed  F. Movement:    Observed                   N.S.T:          Reactive  F. Breathing:   Not Observed               Score:          8/10 ---------------------------------------------------------------------- OB History  Gravidity:    4         Term:   2        Prem:   0        SAB:   1  TOP:          0       Ectopic:  0        Living: 2 ---------------------------------------------------------------------- Gestational Age  LMP:           29w 4d        Date:  04/08/23  EDD:   01/13/24  Best:          Armida Sans 4d     Det. By:  U/S C R L  (05/29/23)    EDD:   12/23/23 ---------------------------------------------------------------------- Anatomy  Diaphragm:             Appears normal         Kidneys:                Appear normal  Stomach:               Appears normal, left   Bladder:                Appears normal                         sided ---------------------------------------------------------------------- Cervix Uterus Adnexa  Cervix  Not visualized (advanced GA >24wks) ---------------------------------------------------------------------- Impression  Amniotic fluid is normal good fetal activity seen.  Fetal  breathing movements did not meet the criteria of BPP.  NST  is reactive.  BPP 8/10.  We reassured the patient of the findings.  xxxxxxxxxxxxxxxxxxxxxxxxxxxxxxxxxxxxxxxxxxxxxxxxxxxxxxxxx  xxxxxxxxxxxx  Maternal-Fetal Medicine Consultation  I had the pleasure of seeing Ms. Shriley Joffe [facility] at [facility]. She is G4 P2012 at 32w 4d  gestation and is here for antenatal testing. Her problems  include:  -Gestational diabetes.  Patient reports that his Dexcom has  dropped out.  Her fasting levels have been high and Lantus  insulin 6 units was started 4 days ago at her prenatal visit.  Patient reports her postprandial levels are within normal  range before starting Lantus insulin.  -Chronic hypertension.  Nifedipine was discontinued and  labetalol 200 mg 3 times daily was started.  Blood pressure  today at our office is 130/87 mmHg.  -Paroxysmal supraventricular tachycardia (PSVT)discovered  on Zio patch.  Patient is being followed by her cardiologist  whose note mentioned that she has rare PSVT.  I discussed the importance of checking her blood glucose  regularly to prevent fetal and neonatal adverse outcomes.  Fasting levels were still high and would like to watch for 2  more days before increasing the dosage of Lantus insulin.  Patient is planning to have dexacom inserted.  I reassured the patient of safety profile of labetalol.  It can be  associated with smaller babies in some cases.  We discussed timing of delivery.  If diabetes is not well-  controlled, early term delivery is appropriate. ---------------------------------------------------------------------- Recommendations  -Continue weekly BPP till delivery.  -Recommend weekly prenatal visits to address diabetes. ----------------------------------------------------------------------                 Noralee Space, MD Electronically Signed Final Report   11/01/2023 03:13 pm ----------------------------------------------------------------------    Assessment and Plan:  Pregnancy: [id number] at [redacted]w[redacted]d 1. Preexisting hypertension complicating pregnancy, antepartum (Primary) Did not take Labetalol dose yet.  Recommended going to hospital for evaluation, she wants to take her medication and recheck her BP. If still elevated, she will go to  hospital.  She has appointment with Cardiology on 11/27/23.  Will check labs and NST today. Severe preeclampsia precautions reviewed with patient. - CBC - Comprehensive metabolic panel - Protein / creatinine ratio, urine - NST performed today was reviewed and was found to be reactive.  Continue recommended antenatal testing and prenatal care.  2. Insulin controlled gestational diabetes mellitus (GDM) during pregnancy, antepartum Reports stable  blood sugars on Lantus.  Continue antenatal testing.  A1C checked today.  - Hemoglobin A1c  3. Excessive fetal growth affecting management of pregnancy in third trimester, single or unspecified fetus EFW 89%.  4. [redacted] weeks gestation of pregnancy 5. Supervision of high risk pregnancy, antepartum Negative pelvic cultures done last week. Labor symptoms and general obstetric precautions including but not limited to vaginal bleeding, contractions, leaking of fluid and fetal movement were reviewed in detail with the patient. Please refer to After Visit Summary for other counseling recommendations.   Return in about 1 week (around 12/02/2023) for OFFICE OB VISIT (MD only).  Future Appointments  Date Time Provider Department Center  11/28/2023  4:00 PM Thomasene Ripple, DO [facility] None  11/29/2023  1:15 PM [facility] NURSE [facility]  11/29/2023  1:30 PM [facility] US2 [facility]  12/02/2023  3:30 PM Federico Flake, MD [facility]  12/12/2023  6:45 AM [facility] SCHED ROOM [facility] None    Jaynie Collins, MD

## 2023-11-26 ENCOUNTER — Encounter: Payer: Self-pay | Admitting: Obstetrics & Gynecology

## 2023-11-26 ENCOUNTER — Telehealth: Payer: Self-pay

## 2023-11-26 LAB — COMPREHENSIVE METABOLIC PANEL
ALT: 11 IU/L (ref 0–32)
AST: 13 IU/L (ref 0–40)
Albumin: 3.5 g/dL — ABNORMAL LOW (ref 3.9–4.9)
Alkaline Phosphatase: 109 IU/L (ref 44–121)
BUN/Creatinine Ratio: 10 (ref 9–23)
BUN: 7 mg/dL (ref 6–24)
Bilirubin Total: <0.2 mg/dL (ref 0.0–1.2)
CO2: 19 mmol/L — ABNORMAL LOW (ref 20–29)
Calcium: 9.3 mg/dL (ref 8.7–10.2)
Chloride: 106 mmol/L (ref 96–106)
Creatinine, Ser: 0.67 mg/dL (ref 0.57–1.00)
Globulin, Total: 2.2 g/dL (ref 1.5–4.5)
Glucose: 111 mg/dL — ABNORMAL HIGH (ref 70–99)
Potassium: 4.3 mmol/L (ref 3.5–5.2)
Sodium: 137 mmol/L (ref 134–144)
Total Protein: 5.7 g/dL — ABNORMAL LOW (ref 6.0–8.5)
eGFR: 113 mL/min/{1.73_m2} (ref >59)

## 2023-11-26 LAB — CBC
Hematocrit: 34.2 % (ref 34.0–46.6)
Hemoglobin: 11.5 g/dL (ref 11.1–15.9)
MCH: 29.1 pg (ref 26.6–33.0)
MCHC: 33.6 g/dL (ref 31.5–35.7)
MCV: 87 fL (ref 79–97)
Platelets: 255 10*3/uL (ref 150–450)
RBC: 3.95 x10E6/uL (ref 3.77–5.28)
RDW: 13.5 % (ref 11.7–15.4)
WBC: 10.8 10*3/uL (ref 3.4–10.8)

## 2023-11-26 LAB — HEMOGLOBIN A1C
Est. average glucose Bld gHb Est-mCnc: 137 mg/dL
Hgb A1c MFr Bld: 6.4 % — ABNORMAL HIGH (ref 4.8–5.6)

## 2023-11-26 NOTE — Telephone Encounter (Signed)
[facility]

## 2023-11-27 ENCOUNTER — Encounter: Payer: Self-pay | Admitting: *Deleted

## 2023-11-27 LAB — PROTEIN / CREATININE RATIO, URINE
Creatinine, Urine: 239.2 mg/dL
Protein, Ur: 36.9 mg/dL
Protein/Creat Ratio: 154 mg/g{creat} (ref 0–200)

## 2023-11-28 ENCOUNTER — Encounter: Payer: Self-pay | Admitting: Obstetrics & Gynecology

## 2023-11-28 ENCOUNTER — Ambulatory Visit: Payer: 59 | Attending: Cardiology | Admitting: Cardiology

## 2023-11-28 ENCOUNTER — Encounter: Payer: Self-pay | Admitting: Cardiology

## 2023-11-28 VITALS — BP 138/78 | HR 84 | Ht 65.0 in | Wt 356.6 lb

## 2023-11-28 DIAGNOSIS — I10 Essential (primary) hypertension: Secondary | ICD-10-CM | POA: Diagnosis not present

## 2023-11-28 DIAGNOSIS — Z3A36 36 weeks gestation of pregnancy: Secondary | ICD-10-CM | POA: Diagnosis not present

## 2023-11-28 DIAGNOSIS — O24414 Gestational diabetes mellitus in pregnancy, insulin controlled: Secondary | ICD-10-CM | POA: Diagnosis not present

## 2023-11-28 DIAGNOSIS — O1203 Gestational edema, third trimester: Secondary | ICD-10-CM

## 2023-11-28 MED ORDER — FUROSEMIDE 40 MG PO TABS: 40.0000 mg | ORAL_TABLET | Freq: Every day | ORAL | 0 refills | Status: DC

## 2023-11-28 MED ORDER — LABETALOL HCL 300 MG PO TABS
300.0000 mg | ORAL_TABLET | Freq: Three times a day (TID) | ORAL | 3 refills | Status: DC
Start: 2023-11-28 — End: 2023-12-18

## 2023-11-28 MED ORDER — POTASSIUM CHLORIDE CRYS ER 20 MEQ PO TBCR: 20.0000 meq | EXTENDED_RELEASE_TABLET | Freq: Every day | ORAL | 0 refills | Status: DC

## 2023-11-28 NOTE — Progress Notes (Signed)
 Cardio-Obstetrics Clinic  New Evaluation  Date:  11/29/2023   ID:  Jane Jackson, DOB 24-Jun-1986, MRN [id number]  PCP:  Pcp, No   [facility] Providers Cardiologist:  Thomasene Ripple, DO  Electrophysiologist:  None       Referring MD: Theadore Nan, NP   Chief Complaint: " I am short of breath even at rest"  History of Present Illness:    Jane Jackson is a 38 y.o. female [G4P2012] who is being seen today for the evaluation of shortness of breath at the request of Theadore Nan, NP.   Medical history includes PSVT, chronic hypertension in pregnancy and gestational diabetes.   Since her visit with me she has seen Laural Golden, PharmD and her medications were adjusted. She is still hypertensive.   She reports that her blood pressure has been increasing over the past several weeks, despite taking labetalol 300mg  twice daily. She denies experiencing any headaches or other symptoms associated with high blood pressure. She has found that taking labetalol three times daily has been more effective in managing her blood pressure, which reached the previous night.  The patient also reports significant swelling, which she says improves with Lasix. She has been taking Lasix as needed and would like to continue this medication. She also mentions that she feels better when she is able to stay elevated, as lying down causes her oxygen saturation to drop.  The patient expresses concern about her upcoming labor and delivery, specifically fearing that her high blood pressure may lead to a C-section. She strongly prefers to deliver vaginally, as she has done with her previous two children.   Prior CV Studies Reviewed: The following studies were reviewed today: Prior echo  Past Medical History:  Diagnosis Date   Anxiety    Asthma    Chicken pox    Complication of anesthesia    issue with asthma and intubation   Cystic dysplasia of one kidney    about 4 months ago-no  f/u- largest 5mm?   Diverticulitis    Family history of cystic fibrosis    Normal CF screen   Family history of mental retardation    Fibroid, uterine    dx a few months ago   GERD (gastroesophageal reflux disease)    Gestational diabetes    H/O mitral valve prolapse    as a child   History of mitral valve prolapse 09/16/2016   Hypertension    Impaction, bowel (HCC)    Inappropriate sinus tachycardia (HCC)    a. exacerbated by pregnancy - 2018; b. 01/2017 Holter: 29% of time in sinus tachycardia;  c. 02/2017 Echo: EF 65-70%, no rwma; c. 01/2017 Holter: 29% of time in sinus tachycardia.   Kidney stones    Prediabetes 01/16/2022   Upper respiratory tract infection 05/27/2020    Past Surgical History:  Procedure Laterality Date   APPENDECTOMY     CHOLECYSTECTOMY N/A 01/18/2016   Procedure: LAPAROSCOPIC CHOLECYSTECTOMY WITH INTRAOPERATIVE CHOLANGIOGRAM;  Surgeon: Glenna Fellows, MD;  Location: [facility];  Service: General;  Laterality: N/A;   ruptured cyst     TONSILLECTOMY AND ADENOIDECTOMY     age 86   TYMPANOSTOMY TUBE PLACEMENT        OB History     Gravida  4   Para  2   Term  2   Preterm      AB  1   Living  2      SAB  1  IAB      Ectopic      Multiple  0   Live Births  2               Current Medications: Current Meds  Medication Sig   albuterol (VENTOLIN HFA) 108 (90 Base) MCG/ACT inhaler Inhale 1 puff into the lungs every 6 (six) hours as needed for wheezing or shortness of breath.   aspirin EC 81 MG tablet Take 2 tablets (162 mg total) by mouth at bedtime. Start taking when you are [redacted] weeks pregnant for rest of pregnancy for prevention of preeclampsia   Continuous Glucose Sensor (DEXCOM G6 SENSOR) MISC 1 Package by Does not apply route every 30 (thirty) days.   Continuous Glucose Transmitter (DEXCOM G6 TRANSMITTER) MISC 1 Package by Does not apply route every 3 (three) months.   insulin glargine (LANTUS) 100 UNIT/ML Solostar Pen Inject 6 Units  into the skin at bedtime.   Insulin Pen Needle (PEN NEEDLES) 33G X 4 MM MISC Please use as directed   labetalol (NORMODYNE) 300 MG tablet Take 1 tablet (300 mg total) by mouth 3 (three) times daily.   ondansetron (ZOFRAN-ODT) 4 MG disintegrating tablet Take 1 tablet (4 mg total) by mouth every 6 (six) hours as needed for nausea.   Prenatal Vit-Fe Fumarate-FA (PRENATAL MULTIVITAMIN) TABS tablet Take 1 tablet by mouth daily at 12 noon.   [DISCONTINUED] labetalol (NORMODYNE) 200 MG tablet Take 1 tablet (200 mg total) by mouth 3 (three) times daily.     Allergies:   Latex   Social History   Socioeconomic History   Marital status: Married    Spouse name: Not on file   Number of children: Not on file   Years of education: Not on file   Highest education level: Not on file  Occupational History   Not on file  Tobacco Use   Smoking status: Former    Current packs/day: 0.00    Types: Cigarettes    Quit date: 03/03/2008    Years since quitting: 15.7   Smokeless tobacco: Never   Tobacco comments:    Smoked occ, no smoking for years, per pt  Vaping Use   Vaping status: Never Used  Substance and Sexual Activity   Alcohol use: Not Currently    Comment: last use 10/28/2021 "one drink"   Drug use: No   Sexual activity: Yes    Partners: Male    Birth control/protection: None    Comment: hx ocp, depo, condom  Other Topics Concern   Not on file  Social History Narrative   Not on file   Social Drivers of Health   Financial Resource Strain: Not on file  Food Insecurity: No Food Insecurity (07/04/2022)   Hunger Vital Sign    Worried About Running Out of Food in the Last Year: Never true    Ran Out of Food in the Last Year: Never true  Transportation Needs: No Transportation Needs (07/04/2022)   PRAPARE - Administrator, Civil Service (Medical): No    Lack of Transportation (Non-Medical): No  Physical Activity: Not on file  Stress: Not on file  Social Connections: Not on  file      Family History  Problem Relation Age of Onset   Arthritis Mother    Hypertension Mother    Diabetes Mother    Cervical cancer Mother    Alcohol abuse Father    Arthritis Father    Hypertension Father    Arthritis  Maternal Grandmother    Heart disease Maternal Grandmother    Stroke Maternal Grandmother    Hypertension Maternal Grandmother    Diabetes Maternal Grandmother    Arthritis Maternal Grandfather    Colon cancer Maternal Grandfather    Prostate cancer Maternal Grandfather    Heart disease Maternal Grandfather    Stroke Maternal Grandfather    Hypertension Maternal Grandfather    Arthritis Paternal Grandmother    Heart disease Paternal Grandmother    Hypertension Paternal Grandmother    Diabetes Paternal Grandmother    Arthritis Paternal Grandfather    Heart disease Paternal Grandfather    Hypertension Paternal Grandfather    Thyroid disease Maternal Aunt        thyroid cancer      ROS:   Please see the history of present illness.    Shortness of breath All other systems reviewed and are negative.   Labs/EKG Reviewed:    EKG:   EKG was ordered today.  The ekg ordered today demonstrates NSR HR 91 bpm  Recent Labs: 06/10/2023: TSH 1.160 10/08/2023: Magnesium 1.7 11/25/2023: ALT 11; BUN 7; Creatinine, Ser 0.67; Hemoglobin 11.5; Platelets 255; Potassium 4.3; Sodium 137   Recent Lipid Panel No results found for: "CHOL", "TRIG", "HDL", "CHOLHDL", "LDLCALC", "LDLDIRECT"  Physical Exam:    VS:  BP 138/78 (BP Location: Left Arm, Patient Position: Sitting, Cuff Size: Large)   Pulse 84   Ht 5\' 5"  (1.651 m)   Wt (!) 356 lb 9.6 oz (161.8 kg)   LMP 04/08/2023   SpO2 98%   BMI 59.34 kg/m     Wt Readings from Last 3 Encounters:  11/28/23 (!) 356 lb 9.6 oz (161.8 kg)  11/25/23 (!) 352 lb (159.7 kg)  11/21/23 (!) 345 lb (156.5 kg)     GEN:  Well nourished, well developed in no acute distress HEENT: Normal NECK: No JVD; No carotid bruits LYMPHATICS:  No lymphadenopathy CARDIAC: RRR, no murmurs, rubs, gallops RESPIRATORY:  Clear to auscultation without rales, wheezing or rhonchi  ABDOMEN: Soft, non-tender, non-distended MUSCULOSKELETAL:  No edema; No deformity  SKIN: Warm and dry NEUROLOGIC:  Alert and oriented x 3 PSYCHIATRIC:  Normal affect    Risk Assessment/Risk Calculators:                  ASSESSMENT & PLAN:    Hypertension Blood pressure reached 200 mmHg. Current labetalol 300 mg BID insufficient; increase to TID effective. Nifedipine caused severe headaches. Goal: manage blood pressure to avoid complications during labor and delivery, particularly a C-section. - Increase labetalol to 300 mg TID. - Monitor blood pressure regularly. - Consult cardiology if hypertension persists in hospital.  Significant bilateral Edema Significant swelling improved with Lasix, likely related to pregnancy and hypertension. Lasix provides temporary relief. - Prescribe Lasix for five days. - Administer potassium supplementation with Lasix.  Pregnancy-related concerns Managing blood pressure and edema to avoid labor and delivery complications. Scheduled for induction on March 20th, prefers to avoid C-section. Experiences orthopnea due to baby's position on IVC, alleviated by Lasix. - Schedule cardiology follow-up in one week. - Coordinate care with MFM and OB teams.   Gestational Diabetes No current issues reported. History of gestational diabetes in previous pregnancy. -Continue current management.  Preeclampsia Prevention Currently on Aspirin 162mg  daily. No history of preeclampsia  Monitor showed rare PSVT, if blood pressure improve and still have sob will plan to treat the rare psvt.  Follow-up in 6 weeks. If any concerns arise before then, patient  to contact via MyChart.    Patient Instructions  Medication Instructions:  Your physician has recommended you make the following change in your medication:  INCREASE:  Labetalol 300 mg three times daily For 5 days: Lasix 40 mg once daily For 5 days: Potassium 20 mEq once daily  *If you need a refill on your cardiac medications before your next appointment, please call your pharmacy*   Follow-Up: At [facility], you and your health needs are our priority.  As part of our continuing mission to provide you with exceptional heart care, we have created designated Provider Care Teams.  These Care Teams include your primary Cardiologist (physician) and Advanced Practice Providers (APPs -  Physician Assistants and Nurse Practitioners) who all work together to provide you with the care you need, when you need it.  Your next appointment:   3 week(s)  Provider:   Thomasene Ripple, DO       Dispo:  No follow-ups on file.   Medication Adjustments/Labs and Tests Ordered: Current medicines are reviewed at length with the patient today.  Concerns regarding medicines are outlined above.  Tests Ordered: No orders of the defined types were placed in this encounter.  Medication Changes: Meds ordered this encounter  Medications   labetalol (NORMODYNE) 300 MG tablet    Sig: Take 1 tablet (300 mg total) by mouth 3 (three) times daily.    Dispense:  270 tablet    Refill:  3   furosemide (LASIX) 40 MG tablet    Sig: Take 1 tablet (40 mg total) by mouth daily for 5 days.    Dispense:  5 tablet    Refill:  0   potassium chloride SA (KLOR-CON M20) 20 MEQ tablet    Sig: Take 1 tablet (20 mEq total) by mouth daily for 5 days.    Dispense:  5 tablet    Refill:  0

## 2023-11-28 NOTE — Patient Instructions (Signed)
 Medication Instructions:  Your physician has recommended you make the following change in your medication:  INCREASE: Labetalol 300 mg three times daily For 5 days: Lasix 40 mg once daily For 5 days: Potassium 20 mEq once daily  *If you need a refill on your cardiac medications before your next appointment, please call your pharmacy*   Follow-Up: At [facility], you and your health needs are our priority.  As part of our continuing mission to provide you with exceptional heart care, we have created designated Provider Care Teams.  These Care Teams include your primary Cardiologist (physician) and Advanced Practice Providers (APPs -  Physician Assistants and Nurse Practitioners) who all work together to provide you with the care you need, when you need it.  Your next appointment:   3 week(s)  Provider:   Thomasene Ripple, DO

## 2023-11-29 ENCOUNTER — Ambulatory Visit: Payer: Medicaid Other | Attending: Obstetrics

## 2023-11-29 ENCOUNTER — Ambulatory Visit: Payer: Medicaid Other | Admitting: *Deleted

## 2023-11-29 VITALS — BP 143/86 | HR 84

## 2023-11-29 DIAGNOSIS — O10919 Unspecified pre-existing hypertension complicating pregnancy, unspecified trimester: Secondary | ICD-10-CM | POA: Insufficient documentation

## 2023-11-29 DIAGNOSIS — O99213 Obesity complicating pregnancy, third trimester: Secondary | ICD-10-CM | POA: Diagnosis not present

## 2023-11-29 DIAGNOSIS — O3663X0 Maternal care for excessive fetal growth, third trimester, not applicable or unspecified: Secondary | ICD-10-CM

## 2023-11-29 DIAGNOSIS — E669 Obesity, unspecified: Secondary | ICD-10-CM

## 2023-11-29 DIAGNOSIS — O10913 Unspecified pre-existing hypertension complicating pregnancy, third trimester: Secondary | ICD-10-CM | POA: Insufficient documentation

## 2023-11-29 DIAGNOSIS — O24419 Gestational diabetes mellitus in pregnancy, unspecified control: Secondary | ICD-10-CM | POA: Diagnosis present

## 2023-11-29 DIAGNOSIS — O0990 Supervision of high risk pregnancy, unspecified, unspecified trimester: Secondary | ICD-10-CM | POA: Diagnosis present

## 2023-11-29 DIAGNOSIS — O24414 Gestational diabetes mellitus in pregnancy, insulin controlled: Secondary | ICD-10-CM | POA: Diagnosis not present

## 2023-11-29 DIAGNOSIS — O10013 Pre-existing essential hypertension complicating pregnancy, third trimester: Secondary | ICD-10-CM

## 2023-11-29 DIAGNOSIS — Z3A36 36 weeks gestation of pregnancy: Secondary | ICD-10-CM

## 2023-11-29 DIAGNOSIS — Z6841 Body Mass Index (BMI) 40.0 and over, adult: Secondary | ICD-10-CM | POA: Diagnosis present

## 2023-11-29 DIAGNOSIS — O09293 Supervision of pregnancy with other poor reproductive or obstetric history, third trimester: Secondary | ICD-10-CM

## 2023-11-29 DIAGNOSIS — O09523 Supervision of elderly multigravida, third trimester: Secondary | ICD-10-CM

## 2023-12-02 ENCOUNTER — Ambulatory Visit (INDEPENDENT_AMBULATORY_CARE_PROVIDER_SITE_OTHER): Payer: Medicaid Other | Admitting: Family Medicine

## 2023-12-02 VITALS — BP 138/78 | Wt 352.0 lb

## 2023-12-02 DIAGNOSIS — Z6841 Body Mass Index (BMI) 40.0 and over, adult: Secondary | ICD-10-CM

## 2023-12-02 DIAGNOSIS — O3663X0 Maternal care for excessive fetal growth, third trimester, not applicable or unspecified: Secondary | ICD-10-CM

## 2023-12-02 DIAGNOSIS — O99210 Obesity complicating pregnancy, unspecified trimester: Secondary | ICD-10-CM | POA: Diagnosis not present

## 2023-12-02 DIAGNOSIS — O0990 Supervision of high risk pregnancy, unspecified, unspecified trimester: Secondary | ICD-10-CM

## 2023-12-02 DIAGNOSIS — O24414 Gestational diabetes mellitus in pregnancy, insulin controlled: Secondary | ICD-10-CM

## 2023-12-02 DIAGNOSIS — O10919 Unspecified pre-existing hypertension complicating pregnancy, unspecified trimester: Secondary | ICD-10-CM

## 2023-12-02 NOTE — Progress Notes (Signed)
 Did notice blood in her urine today

## 2023-12-03 ENCOUNTER — Encounter: Payer: Self-pay | Admitting: *Deleted

## 2023-12-03 ENCOUNTER — Telehealth (HOSPITAL_COMMUNITY): Payer: Self-pay | Admitting: *Deleted

## 2023-12-03 ENCOUNTER — Telehealth: Payer: Self-pay | Admitting: Pharmacist

## 2023-12-03 ENCOUNTER — Telehealth: Payer: Self-pay | Admitting: *Deleted

## 2023-12-03 NOTE — Telephone Encounter (Signed)
 Pt called letting us know that she is starting to have more symptoms as she did have some blood in her urine yesterday, today she has a low grade fever and low back pain. Spoke with Dr Alvester Morin and pt should go to [facility] to be evaluated/ Message sent to pt

## 2023-12-03 NOTE — Telephone Encounter (Signed)
 Preadmission screen

## 2023-12-03 NOTE — Telephone Encounter (Signed)
 Called patient to schedule f/u this week. No answer, lmom

## 2023-12-03 NOTE — Telephone Encounter (Signed)
-----   Message from Nurse Devoria Glassing sent at 11/28/2023  5:16 PM EST ----- Hi guys,  Dr. Servando Salina wants this pt to be seen next week, she is towards the end of her pregnancy. Can you all help out with this? -[facility]

## 2023-12-04 NOTE — Telephone Encounter (Signed)
 Called pt to set up appt with Thayer Ohm P. No answer, left message for her to return the call.

## 2023-12-05 ENCOUNTER — Ambulatory Visit: Admitting: *Deleted

## 2023-12-05 ENCOUNTER — Inpatient Hospital Stay (HOSPITAL_COMMUNITY): Payer: 59

## 2023-12-05 ENCOUNTER — Telehealth (HOSPITAL_COMMUNITY): Payer: Self-pay | Admitting: *Deleted

## 2023-12-05 ENCOUNTER — Ambulatory Visit: Attending: Obstetrics and Gynecology

## 2023-12-05 ENCOUNTER — Ambulatory Visit: Attending: Obstetrics and Gynecology | Admitting: Obstetrics and Gynecology

## 2023-12-05 VITALS — BP 133/101 | HR 92

## 2023-12-05 DIAGNOSIS — O10919 Unspecified pre-existing hypertension complicating pregnancy, unspecified trimester: Secondary | ICD-10-CM | POA: Insufficient documentation

## 2023-12-05 DIAGNOSIS — O10013 Pre-existing essential hypertension complicating pregnancy, third trimester: Secondary | ICD-10-CM | POA: Diagnosis not present

## 2023-12-05 DIAGNOSIS — Z6841 Body Mass Index (BMI) 40.0 and over, adult: Secondary | ICD-10-CM | POA: Insufficient documentation

## 2023-12-05 DIAGNOSIS — O24414 Gestational diabetes mellitus in pregnancy, insulin controlled: Secondary | ICD-10-CM

## 2023-12-05 DIAGNOSIS — Z3A37 37 weeks gestation of pregnancy: Secondary | ICD-10-CM

## 2023-12-05 DIAGNOSIS — O0990 Supervision of high risk pregnancy, unspecified, unspecified trimester: Secondary | ICD-10-CM

## 2023-12-05 DIAGNOSIS — O09523 Supervision of elderly multigravida, third trimester: Secondary | ICD-10-CM | POA: Diagnosis not present

## 2023-12-05 DIAGNOSIS — O09293 Supervision of pregnancy with other poor reproductive or obstetric history, third trimester: Secondary | ICD-10-CM

## 2023-12-05 DIAGNOSIS — O10913 Unspecified pre-existing hypertension complicating pregnancy, third trimester: Secondary | ICD-10-CM

## 2023-12-05 DIAGNOSIS — O3663X0 Maternal care for excessive fetal growth, third trimester, not applicable or unspecified: Secondary | ICD-10-CM | POA: Diagnosis not present

## 2023-12-05 NOTE — Telephone Encounter (Signed)
 Preadmission screen

## 2023-12-05 NOTE — Progress Notes (Signed)
  Maternal-Fetal Medicine Consultation I had the pleasure of seeing [name] today at the [facility]. She is G4 P2012 at 37w 3d gestation and is here for fetal growth and antenatal testing (BPP).  Gestational diabetes.  Patient takes Lantus insulin 8 units at night.  Her fasting levels range between 95 and 100 mg/dL and postprandial (2 hours) range between 100 and 120 mg/dL.  Chronic hypertension.  Patient takes labetalol 300 units 3 times daily.  She does not have signs and symptoms of severe features of preeclampsia.  Blood pressures today at our office were 133/101 mmHg and repeat 126/92 mmHg.  Ultrasound The estimated fetal weight is at the 98 percentile and the abdominal circumference measurement is at the 99th percentile.  The estimated fetal weight is 3,960 g (8-12).  Amniotic fluid is normal good fetal activity seen.  Cephalic presentation.  Antenatal testing is reassuring.  BPP 8/8.  I counseled the patient on the findings that ultrasound has limitations in accurately estimating fetal weights.  Elective cesarean delivery is not recommended unless the estimated fetal weight is greater than 4,500 grams (ACOG recommendation).  Shoulder dystocia can complicate diabetic pregnancies even in the absence of macrosomia.  Her fasting levels are still slightly high but patient reports episodes of hypoglycemia with increase in insulin dosage. I counseled her that we can deliver her now given that she has both hypertension and diabetes.  Patient would like to wait till the planned induction date (12/12/23).   Consultation including face-to-face (more than 50%) counseling 20 minutes.

## 2023-12-06 ENCOUNTER — Ambulatory Visit

## 2023-12-06 ENCOUNTER — Telehealth (HOSPITAL_COMMUNITY): Payer: Self-pay | Admitting: *Deleted

## 2023-12-06 ENCOUNTER — Telehealth: Payer: Self-pay

## 2023-12-06 ENCOUNTER — Telehealth: Payer: Self-pay | Admitting: Pharmacist

## 2023-12-06 DIAGNOSIS — O24414 Gestational diabetes mellitus in pregnancy, insulin controlled: Secondary | ICD-10-CM

## 2023-12-06 DIAGNOSIS — I10 Essential (primary) hypertension: Secondary | ICD-10-CM

## 2023-12-06 NOTE — Telephone Encounter (Signed)
 Called pt no answer, left message for her to confirm the appt.  Called her spouse. He confirmed they will be seen today. No further questions at this time.

## 2023-12-06 NOTE — Telephone Encounter (Signed)
 Patient scheduled for appointment this afternoon at 3:30pm which she confirmed earlier today. Received call from scheduling that patient would like to speak over the phone. Scheduled for IOL on Thursday 3/20.  Patient reports blood pressures continue to be consistently elevated, especially diastolic. Last 3 readings from her home log: 138/92, 144/98, 132/92. Remains on labetalol 300mg  TID (nifedipine caused headaches). Blood has been slightly better controlled with fasting BG between 95-105.   Major complaint is difficulty breathing. She relates this to the labetalol. Having trouble sleeping due to breathing issues. Plans to try to sleep on recliner tonight. Has swelling in extremities but not as bad as before. Advised if swelling gets worse, BP increases, or breathing gets more difficult she should report to ED. Requested Lasix. Advised I did not feel comfortable without seeing her. She voiced understanding. Advised I would route to Dr Servando Salina.

## 2023-12-06 NOTE — Telephone Encounter (Signed)
 Called patient regarding recommendations. No answer, left message on machine advising of recommendation to be seen at [facility]

## 2023-12-06 NOTE — Telephone Encounter (Signed)
 Preadmission screen

## 2023-12-09 ENCOUNTER — Encounter: Payer: Medicaid Other | Admitting: Family Medicine

## 2023-12-09 ENCOUNTER — Telehealth (HOSPITAL_COMMUNITY): Payer: Self-pay | Admitting: *Deleted

## 2023-12-09 ENCOUNTER — Ambulatory Visit (INDEPENDENT_AMBULATORY_CARE_PROVIDER_SITE_OTHER): Admitting: Family Medicine

## 2023-12-09 VITALS — BP 142/91 | HR 90 | Wt 357.0 lb

## 2023-12-09 DIAGNOSIS — Z3A38 38 weeks gestation of pregnancy: Secondary | ICD-10-CM

## 2023-12-09 DIAGNOSIS — O10913 Unspecified pre-existing hypertension complicating pregnancy, third trimester: Secondary | ICD-10-CM | POA: Diagnosis not present

## 2023-12-09 DIAGNOSIS — O99213 Obesity complicating pregnancy, third trimester: Secondary | ICD-10-CM

## 2023-12-09 DIAGNOSIS — O3663X0 Maternal care for excessive fetal growth, third trimester, not applicable or unspecified: Secondary | ICD-10-CM | POA: Diagnosis not present

## 2023-12-09 DIAGNOSIS — O24414 Gestational diabetes mellitus in pregnancy, insulin controlled: Secondary | ICD-10-CM

## 2023-12-09 DIAGNOSIS — O0990 Supervision of high risk pregnancy, unspecified, unspecified trimester: Secondary | ICD-10-CM

## 2023-12-09 DIAGNOSIS — O10919 Unspecified pre-existing hypertension complicating pregnancy, unspecified trimester: Secondary | ICD-10-CM

## 2023-12-09 NOTE — Telephone Encounter (Signed)
 Preadmission screen

## 2023-12-09 NOTE — Progress Notes (Unsigned)
 ROB   CC: decreased fetal movement. Pt notes sore on belly that is not healing.

## 2023-12-10 ENCOUNTER — Telehealth (HOSPITAL_COMMUNITY): Payer: Self-pay | Admitting: *Deleted

## 2023-12-10 NOTE — Telephone Encounter (Signed)
 Preadmission screen

## 2023-12-11 ENCOUNTER — Encounter (HOSPITAL_COMMUNITY): Payer: Self-pay | Admitting: Obstetrics and Gynecology

## 2023-12-11 ENCOUNTER — Inpatient Hospital Stay (HOSPITAL_COMMUNITY)
Admission: AD | Admit: 2023-12-11 | Discharge: 2023-12-18 | DRG: 805 | Disposition: A | Discharge status: Home / Self Care | Attending: Obstetrics & Gynecology | Admitting: Obstetrics & Gynecology

## 2023-12-11 ENCOUNTER — Other Ambulatory Visit: Payer: Self-pay

## 2023-12-11 DIAGNOSIS — Z6841 Body Mass Index (BMI) 40.0 and over, adult: Secondary | ICD-10-CM

## 2023-12-11 DIAGNOSIS — O3663X0 Maternal care for excessive fetal growth, third trimester, not applicable or unspecified: Secondary | ICD-10-CM | POA: Diagnosis present

## 2023-12-11 DIAGNOSIS — O24424 Gestational diabetes mellitus in childbirth, insulin controlled: Secondary | ICD-10-CM | POA: Diagnosis present

## 2023-12-11 DIAGNOSIS — O8612 Endometritis following delivery: Secondary | ICD-10-CM | POA: Diagnosis not present

## 2023-12-11 DIAGNOSIS — O114 Pre-existing hypertension with pre-eclampsia, complicating childbirth: Secondary | ICD-10-CM | POA: Diagnosis present

## 2023-12-11 DIAGNOSIS — O24414 Gestational diabetes mellitus in pregnancy, insulin controlled: Secondary | ICD-10-CM

## 2023-12-11 DIAGNOSIS — Z91040 Latex allergy status: Secondary | ICD-10-CM

## 2023-12-11 DIAGNOSIS — O09523 Supervision of elderly multigravida, third trimester: Secondary | ICD-10-CM | POA: Diagnosis not present

## 2023-12-11 DIAGNOSIS — Z8249 Family history of ischemic heart disease and other diseases of the circulatory system: Secondary | ICD-10-CM

## 2023-12-11 DIAGNOSIS — O26893 Other specified pregnancy related conditions, third trimester: Secondary | ICD-10-CM | POA: Diagnosis present

## 2023-12-11 DIAGNOSIS — Z3A38 38 weeks gestation of pregnancy: Secondary | ICD-10-CM | POA: Diagnosis not present

## 2023-12-11 DIAGNOSIS — R079 Chest pain, unspecified: Secondary | ICD-10-CM | POA: Diagnosis not present

## 2023-12-11 DIAGNOSIS — Z87891 Personal history of nicotine dependence: Secondary | ICD-10-CM

## 2023-12-11 DIAGNOSIS — O24419 Gestational diabetes mellitus in pregnancy, unspecified control: Secondary | ICD-10-CM | POA: Diagnosis present

## 2023-12-11 DIAGNOSIS — Z79899 Other long term (current) drug therapy: Secondary | ICD-10-CM

## 2023-12-11 DIAGNOSIS — O1092 Unspecified pre-existing hypertension complicating childbirth: Secondary | ICD-10-CM | POA: Diagnosis present

## 2023-12-11 DIAGNOSIS — I4719 Other supraventricular tachycardia: Secondary | ICD-10-CM | POA: Diagnosis present

## 2023-12-11 DIAGNOSIS — O9942 Diseases of the circulatory system complicating childbirth: Secondary | ICD-10-CM | POA: Diagnosis present

## 2023-12-11 DIAGNOSIS — O411230 Chorioamnionitis, third trimester, not applicable or unspecified: Secondary | ICD-10-CM | POA: Diagnosis present

## 2023-12-11 DIAGNOSIS — I10 Essential (primary) hypertension: Secondary | ICD-10-CM | POA: Diagnosis present

## 2023-12-11 DIAGNOSIS — O10919 Unspecified pre-existing hypertension complicating pregnancy, unspecified trimester: Secondary | ICD-10-CM | POA: Diagnosis present

## 2023-12-11 DIAGNOSIS — R7302 Impaired glucose tolerance (oral): Secondary | ICD-10-CM | POA: Diagnosis present

## 2023-12-11 DIAGNOSIS — O1414 Severe pre-eclampsia complicating childbirth: Secondary | ICD-10-CM | POA: Diagnosis not present

## 2023-12-11 DIAGNOSIS — Z833 Family history of diabetes mellitus: Secondary | ICD-10-CM | POA: Diagnosis not present

## 2023-12-11 DIAGNOSIS — O0990 Supervision of high risk pregnancy, unspecified, unspecified trimester: Principal | ICD-10-CM

## 2023-12-11 DIAGNOSIS — I471 Supraventricular tachycardia, unspecified: Secondary | ICD-10-CM | POA: Diagnosis present

## 2023-12-11 DIAGNOSIS — O4292 Full-term premature rupture of membranes, unspecified as to length of time between rupture and onset of labor: Secondary | ICD-10-CM | POA: Diagnosis present

## 2023-12-11 DIAGNOSIS — R0609 Other forms of dyspnea: Secondary | ICD-10-CM | POA: Diagnosis not present

## 2023-12-11 DIAGNOSIS — O99214 Obesity complicating childbirth: Secondary | ICD-10-CM | POA: Diagnosis present

## 2023-12-11 DIAGNOSIS — Z3A34 34 weeks gestation of pregnancy: Secondary | ICD-10-CM

## 2023-12-11 DIAGNOSIS — O4202 Full-term premature rupture of membranes, onset of labor within 24 hours of rupture: Secondary | ICD-10-CM | POA: Diagnosis not present

## 2023-12-11 LAB — COMPREHENSIVE METABOLIC PANEL
ALT: 12 U/L (ref 0–44)
AST: 19 U/L (ref 15–41)
Albumin: 2.4 g/dL — ABNORMAL LOW (ref 3.5–5.0)
Alkaline Phosphatase: 85 U/L (ref 38–126)
Anion gap: 10 (ref 5–15)
BUN: 8 mg/dL (ref 6–20)
CO2: 23 mmol/L (ref 22–32)
Calcium: 9 mg/dL (ref 8.9–10.3)
Chloride: 102 mmol/L (ref 98–111)
Creatinine, Ser: 0.81 mg/dL (ref 0.44–1.00)
GFR, Estimated: >60 mL/min (ref >60)
Glucose, Bld: 173 mg/dL — ABNORMAL HIGH (ref 70–99)
Potassium: 4.2 mmol/L (ref 3.5–5.1)
Sodium: 135 mmol/L (ref 135–145)
Total Bilirubin: 0.3 mg/dL (ref 0.0–1.2)
Total Protein: 5.6 g/dL — ABNORMAL LOW (ref 6.5–8.1)

## 2023-12-11 LAB — GLUCOSE, CAPILLARY: Glucose-Capillary: 125 mg/dL — ABNORMAL HIGH (ref 70–99)

## 2023-12-11 LAB — CBC
HCT: 33.9 % — ABNORMAL LOW (ref 36.0–46.0)
Hemoglobin: 11 g/dL — ABNORMAL LOW (ref 12.0–15.0)
MCH: 28.7 pg (ref 26.0–34.0)
MCHC: 32.4 g/dL (ref 30.0–36.0)
MCV: 88.5 fL (ref 80.0–100.0)
Platelets: 244 10*3/uL (ref 150–400)
RBC: 3.83 MIL/uL — ABNORMAL LOW (ref 3.87–5.11)
RDW: 13.9 % (ref 11.5–15.5)
WBC: 13.8 10*3/uL — ABNORMAL HIGH (ref 4.0–10.5)
nRBC: 0 % (ref 0.0–0.2)

## 2023-12-11 LAB — POCT FERN TEST: POCT Fern Test: POSITIVE

## 2023-12-11 LAB — TYPE AND SCREEN
ABO/RH(D): A POS
Antibody Screen: NEGATIVE

## 2023-12-11 LAB — RPR: RPR Ser Ql: NONREACTIVE

## 2023-12-11 MED ORDER — LABETALOL HCL 5 MG/ML IV SOLN: 40.0000 mg | INTRAVENOUS | Status: DC | PRN

## 2023-12-11 MED ORDER — HYDRALAZINE HCL 20 MG/ML IJ SOLN: 10.0000 mg | INTRAMUSCULAR | Status: DC | PRN

## 2023-12-11 MED ORDER — LIDOCAINE HCL (PF) 1 % IJ SOLN: 30.0000 mL | INTRAMUSCULAR | Status: DC | PRN

## 2023-12-11 MED ORDER — MISOPROSTOL 25 MCG QUARTER TABLET: 25.0000 ug | ORAL_TABLET | Freq: Once | ORAL | Status: DC

## 2023-12-11 MED ORDER — MISOPROSTOL 25 MCG QUARTER TABLET
25.0000 ug | ORAL_TABLET | Freq: Once | ORAL | Status: DC
Start: 2023-12-11 — End: 2023-12-12

## 2023-12-11 MED ORDER — ONDANSETRON HCL 4 MG/2ML IJ SOLN
4.0000 mg | Freq: Four times a day (QID) | INTRAMUSCULAR | Status: DC | PRN
  Administered 2023-12-11 – 2023-12-13 (×7): 4 mg via INTRAVENOUS
  Filled 2023-12-11 (×8): qty 2

## 2023-12-11 MED ORDER — ACETAMINOPHEN 325 MG PO TABS
650.0000 mg | ORAL_TABLET | ORAL | Status: DC | PRN
  Administered 2023-12-13: 650 mg via ORAL
  Filled 2023-12-11: qty 2

## 2023-12-11 MED ORDER — OXYTOCIN-SODIUM CHLORIDE 30-0.9 UT/500ML-% IV SOLN
2.5000 [IU]/h | INTRAVENOUS | Status: DC
  Administered 2023-12-13: 2.5 [IU]/h via INTRAVENOUS

## 2023-12-11 MED ORDER — LABETALOL HCL 5 MG/ML IV SOLN: 80.0000 mg | INTRAVENOUS | Status: DC | PRN

## 2023-12-11 MED ORDER — LACTATED RINGERS IV SOLN: 500.0000 mL | INTRAVENOUS | Status: AC | PRN

## 2023-12-11 MED ORDER — FENTANYL CITRATE (PF) 100 MCG/2ML IJ SOLN
100.0000 ug | INTRAMUSCULAR | Status: AC | PRN
  Administered 2023-12-11 (×2): 100 ug via INTRAVENOUS
  Filled 2023-12-11 (×2): qty 2

## 2023-12-11 MED ORDER — CALCIUM CARBONATE ANTACID 500 MG PO CHEW
2.0000 | CHEWABLE_TABLET | Freq: Three times a day (TID) | ORAL | Status: DC | PRN
  Administered 2023-12-11: 400 mg via ORAL
  Filled 2023-12-11: qty 2

## 2023-12-11 MED ORDER — TERBUTALINE SULFATE 1 MG/ML IJ SOLN: 0.2500 mg | Freq: Once | INTRAMUSCULAR | Status: DC | PRN

## 2023-12-11 MED ORDER — LACTATED RINGERS IV SOLN
INTRAVENOUS | Status: AC
Start: 2023-12-11 — End: 2023-12-12

## 2023-12-11 MED ORDER — FENTANYL CITRATE (PF) 100 MCG/2ML IJ SOLN: 50.0000 ug | INTRAMUSCULAR | Status: DC | PRN

## 2023-12-11 MED ORDER — LABETALOL HCL 5 MG/ML IV SOLN: 20.0000 mg | INTRAVENOUS | Status: DC | PRN

## 2023-12-11 MED ORDER — OXYTOCIN BOLUS FROM INFUSION
333.0000 mL | Freq: Once | INTRAVENOUS | Status: AC
  Administered 2023-12-13: 333 mL via INTRAVENOUS

## 2023-12-11 MED ORDER — SOD CITRATE-CITRIC ACID 500-334 MG/5ML PO SOLN: 30.0000 mL | ORAL | Status: DC | PRN

## 2023-12-11 MED ORDER — LABETALOL HCL 200 MG PO TABS
300.0000 mg | ORAL_TABLET | Freq: Three times a day (TID) | ORAL | Status: DC
  Administered 2023-12-11 – 2023-12-12 (×2): 300 mg via ORAL
  Filled 2023-12-11 (×4): qty 1

## 2023-12-11 NOTE — [facility] Note (Signed)

## 2023-12-11 NOTE — Progress Notes (Signed)
 Patient ID: Jane Jackson, female   DOB: 12/20/1985, 38 y.o.   MRN: [id number] Jane Jackson is a 38 y.o. [id number] at [redacted]w[redacted]d.  Subjective: Pt sitting up in bed, semi throne position, breathing through contractions. Wanting to discuss plan of care.  Objective: BP (!) 183/89   Pulse 87   Temp (P) 98.1 F (36.7 C) (Oral)   Resp (P) 16   LMP 04/08/2023   SpO2 97%    FHT:  FHR: 140-150 bpm, variability: moderate,  accelerations:  present,  decelerations:  none UC: Irregular per pt report, difficulty tracing due to pt position. Dilation: 2 Effacement (%): 50 Station: Ballotable Presentation: Undeterminable Exam by:: [employer], RN  Labs: Results for orders placed or performed during the hospital encounter of 12/11/23 (from the past 24 hours)  POCT fern test     Status: Abnormal   Collection Time: 12/11/23  7:04 AM  Result Value Ref Range   POCT Fern Test Positive = ruptured amniotic membanes   CBC     Status: Abnormal   Collection Time: 12/11/23  7:23 AM  Result Value Ref Range   WBC 13.8 (H) 4.0 - 10.5 K/uL   RBC 3.83 (L) 3.87 - 5.11 MIL/uL   Hemoglobin 11.0 (L) 12.0 - 15.0 g/dL   HCT 73.2 (L) 20.2 - 54.2 %   MCV 88.5 80.0 - 100.0 fL   MCH 28.7 26.0 - 34.0 pg   MCHC 32.4 30.0 - 36.0 g/dL   RDW 70.6 23.7 - 62.8 %   Platelets 244 150 - 400 K/uL   nRBC 0.0 0.0 - 0.2 %  Type and screen     Status: None   Collection Time: 12/11/23  7:23 AM  Result Value Ref Range   ABO/RH(D) A POS    Antibody Screen NEG    Sample Expiration      12/14/2023,2359 Performed at [facility], [address], [place], [place] [address]   RPR     Status: None   Collection Time: 12/11/23  7:23 AM  Result Value Ref Range   RPR Ser Ql NON REACTIVE NON REACTIVE  Comprehensive metabolic panel     Status: Abnormal   Collection Time: 12/11/23  7:23 AM  Result Value Ref Range   Sodium 135 135 - 145 mmol/L   Potassium 4.2 3.5 - 5.1 mmol/L   Chloride 102 98 - 111 mmol/L   CO2 23 22 - 32  mmol/L   Glucose, Bld 173 (H) 70 - 99 mg/dL   BUN 8 6 - 20 mg/dL   Creatinine, Ser 6.16 0.44 - 1.00 mg/dL   Calcium 9.0 8.9 - 07.3 mg/dL   Total Protein 5.6 (L) 6.5 - 8.1 g/dL   Albumin 2.4 (L) 3.5 - 5.0 g/dL   AST 19 15 - 41 U/L   ALT 12 0 - 44 U/L   Alkaline Phosphatase 85 38 - 126 U/L   Total Bilirubin 0.3 0.0 - 1.2 mg/dL   GFR, Estimated >71 >06 mL/min   Anion gap 10 5 - 15  Glucose, capillary     Status: Abnormal   Collection Time: 12/11/23 12:01 PM  Result Value Ref Range   Glucose-Capillary 125 (H) 70 - 99 mg/dL    Assessment / Plan: [redacted]w[redacted]d week IUP ROM x Hours: 18 Minutes: 54 GBS neg Labor: Early labor, pt declining interventions and wanting to attempt letting her body go into labor. Agreeable with nipple stimulation/pumping and discussed the mile circuit for labor positions. 2 severe  range BP's noted and discussed with pt. Pt has been declining her scheduled labetalol, but now agreeable to take her oral labetalol recheck her BP an hour after and reevaluate. Last BG was 125, pt has been declining insulin drip. Will continue to monitor BG Q4 in early labor. Fetal Wellbeing:  Category 1 Pain Control:  IV, nitrous oxide, epidural prn per pt request Anticipated MOD:  SVD  Ky Barban 12/11/2023 10:44 PM

## 2023-12-11 NOTE — Progress Notes (Signed)
   PRENATAL VISIT NOTE  Subjective:  Jane Jackson is a 38 y.o. [id number] at [redacted]w[redacted]d being seen today for ongoing prenatal care.  She is currently monitored for the following issues for this high-risk pregnancy and has Maternal morbid obesity, antepartum (HCC); Uncomplicated asthma; Supervision of high risk pregnancy, antepartum; BMI 50.0-59.9, adult (HCC); Preexisting hypertension complicating pregnancy, antepartum; Gestational diabetes mellitus, antepartum; Excessive fetal growth affecting management of mother in third trimester, antepartum; PSVT (paroxysmal supraventricular tachycardia) (HCC); AMA (advanced maternal age) multigravida 35+, third trimester; and Chronic hypertension during pregnancy, antepartum on their problem list.  Patient reports  blood in urine .  Contractions: Irregular. Vag. Bleeding: None.  Movement: Present. Denies leaking of fluid.   The following portions of the patient's history were reviewed and updated as appropriate: allergies, current medications, past family history, past medical history, past social history, past surgical history and problem list.   Objective:   Vitals:   12/02/23 1559  BP: 138/78  Weight: (!) 352 lb (159.7 kg)    Fetal Status: Fetal Heart Rate (bpm): 140   Movement: Present     General:  Alert, oriented and cooperative. Patient is in no acute distress.  Skin: Skin is warm and dry. No rash noted.   Cardiovascular: Normal heart rate noted  Respiratory: Normal respiratory effort, no problems with respiration noted  Abdomen: Soft, gravid, appropriate for gestational age.  Pain/Pressure: Present     Pelvic: Cervical exam deferred        Extremities: Normal range of motion.     Mental Status: Normal mood and affect. Normal behavior. Normal judgment and thought content.   Assessment and Plan:  Pregnancy: [id number] at [redacted]w[redacted]d 1. Supervision of high risk pregnancy, antepartum (Primary) Up to date  FH seems LGA Vigorous movement Discussed urinary  sx, if she develops any sx we will treat  2. Maternal morbid obesity, antepartum (HCC) TWG= 30 lb (13.6 kg)   3. Insulin controlled gestational diabetes mellitus (GDM) during pregnancy, antepartum Well controlled by numbers but LGA status Reports normal fasting and pp Has IOL [redacted]w[redacted]d  4. Excessive fetal growth affecting management of pregnancy in third trimester, single or unspecified fetus Growth 2/14- A 99th% Repeat on 3/13  5. Chronic hypertension during pregnancy, antepartum WNL  6. BMI 50.0-59.9, adult [facility])    Term labor symptoms and general obstetric precautions including but not limited to vaginal bleeding, contractions, leaking of fluid and fetal movement were reviewed in detail with the patient. Please refer to After Visit Summary for other counseling recommendations.   Return in about 1 week (around 12/09/2023) for Routine prenatal care. Has appt scheduled on 3/17  Federico Flake, MD

## 2023-12-11 NOTE — Progress Notes (Signed)
 Jane Jackson is a 38 y.o. [id number] at [redacted]w[redacted]d admitted for rupture of membranes  Subjective: Pt comfortable, contractions have spaced farther apart. Pt resting in bed.   Objective: BP (!) 150/86   Pulse 84   Temp 98.7 F (37.1 C) (Oral)   Resp 18   LMP 04/08/2023   SpO2 97%  No intake/output data recorded. No intake/output data recorded.  FHT:  FHR: 135 bpm, variability: moderate,  accelerations:  Present,  decelerations:  Absent UC:   irregular, every 5-20 minutes SVE:   Deferred  Labs: Lab Results  Component Value Date   WBC 13.8 (H) 12/11/2023   HGB 11.0 (L) 12/11/2023   HCT 33.9 (L) 12/11/2023   MCV 88.5 12/11/2023   PLT 244 12/11/2023    Assessment / Plan: PROM, without onset of active labor GBS negative  Labor:  Active labor has not begun at this time.  Pt desires to wait and rest some more before augmentation. Discussed decreased chances of C/S with augmentation and concerns about baby's size. Discussed nipple stimulation/walking to encourage stronger contractions. Pt reports little sleep in the last 3 days and desires to rest more first before augmentation.  Pt glucose 130s, pt declines insulin.  Reviewed importance of glucose control prior to delivery.  Preeclampsia:  labs stable Fetal Wellbeing:  Category I Pain Control:  Labor support without medications I/D:   GBS neg Anticipated MOD:  NSVD  Sharen Counter, CNM 12/11/2023, 4:18 PM

## 2023-12-11 NOTE — Inpatient Diabetes Management (Signed)
 Inpatient Diabetes Program Recommendations   ADA Standards of Care 2025 Diabetes in Pregnancy Target Glucose Ranges:  Fasting: 70 - 95 mg/dL 1 hr postprandial:  102 - 140mg /dL (from first bite of meal) 2 hr postprandial:  100 - 120 mg/dL (from first bit of meal)    Lab Results  Component Value Date   GLUCAP 125 (H) 12/11/2023   HGBA1C 6.4 (H) 11/25/2023    Review of Glycemic Control  Latest Reference Range & Units 12/11/23 12:01  Glucose-Capillary 70 - 99 mg/dL 725 (H)   Diabetes history: Gestational DM  Outpatient Diabetes medications:  Lantus 6 units daily, Dexcom G6 Current orders for Inpatient glycemic control:  CBG's now and q 2 hours   Inpatient Diabetes Program Recommendations:   Agree with current orders.   If CBG's>120 mg/dL x's 2, consider IV insulin while in labor.   Thanks,  Lorenza Cambridge, RN, BC-ADM Inpatient Diabetes Coordinator Pager [phone]  (8a-5p)

## 2023-12-11 NOTE — H&P (Addendum)
 OBSTETRIC ADMISSION HISTORY AND PHYSICAL  Jane Jackson is a 38 y.o. female [phone] with IUP at [redacted]w[redacted]d by [place] presenting for SROM. She reports +FMs, No LOF, no VB, no blurry vision, headaches or peripheral edema, and RUQ pain.  She plans on breast feeding. She request vasectomy for birth control. She received her prenatal care at  [facility]     Dating: By [place] --->  Estimated Date of Delivery: 12/23/23  Sono:    @[redacted]w[redacted]d , CWD, normal anatomy, cephalic presentation, 3960 g, 98% EFW   Prenatal History/Complications:  #CHTN in pregnancy #Morbid obesity affecting pregnancy  #GDM on insulin  #PSVT  #AMA #Uncomplicated asthma  Past Medical History: Past Medical History:  Diagnosis Date   Anxiety    Asthma    Chicken pox    Complication of anesthesia    issue with asthma and intubation   Cystic dysplasia of one kidney    about 4 months ago-no f/u- largest 5mm?   Diverticulitis    Family history of cystic fibrosis    Normal CF screen   Family history of mental retardation    Fibroid, uterine    dx a few months ago   GERD (gastroesophageal reflux disease)    Gestational diabetes    H/O mitral valve prolapse    as a child   History of mitral valve prolapse 09/16/2016   Hypertension    Impaction, bowel (HCC)    Inappropriate sinus tachycardia (HCC)    a. exacerbated by pregnancy - 2018; b. 01/2017 Holter: 29% of time in sinus tachycardia;  c. 02/2017 Echo: EF 65-70%, no rwma; c. 01/2017 Holter: 29% of time in sinus tachycardia.   Kidney stones    Prediabetes 01/16/2022   Upper respiratory tract infection 05/27/2020    Past Surgical History: Past Surgical History:  Procedure Laterality Date   APPENDECTOMY     CHOLECYSTECTOMY N/A 01/18/2016   Procedure: LAPAROSCOPIC CHOLECYSTECTOMY WITH INTRAOPERATIVE CHOLANGIOGRAM;  Surgeon: Glenna Fellows, MD;  Location: [facility];  Service: General;  Laterality: N/A;   ruptured cyst     TONSILLECTOMY AND ADENOIDECTOMY     age 89    TYMPANOSTOMY TUBE PLACEMENT      Obstetrical History: OB History     Gravida  4   Para  2   Term  2   Preterm      AB  1   Living  2      SAB  1   IAB      Ectopic      Multiple  0   Live Births  2           Social History Social History   Socioeconomic History   Marital status: Married    Spouse name: Not on file   Number of children: Not on file   Years of education: Not on file   Highest education level: Not on file  Occupational History   Not on file  Tobacco Use   Smoking status: Former    Current packs/day: 0.00    Types: Cigarettes    Quit date: 03/03/2008    Years since quitting: 15.7   Smokeless tobacco: Never   Tobacco comments:    Smoked occ, no smoking for years, per pt  Vaping Use   Vaping status: Never Used  Substance and Sexual Activity   Alcohol use: Not Currently    Comment: last use 10/28/2021 "one drink"   Drug use: No   Sexual activity: Yes  Partners: Male    Birth control/protection: None    Comment: hx ocp, depo, condom  Other Topics Concern   Not on file  Social History Narrative   Not on file   Social Drivers of Health   Financial Resource Strain: Not on file  Food Insecurity: No Food Insecurity (12/11/2023)   Hunger Vital Sign    Worried About Running Out of Food in the Last Year: Never true    Ran Out of Food in the Last Year: Never true  Transportation Needs: No Transportation Needs (12/11/2023)   PRAPARE - Administrator, Civil Service (Medical): No    Lack of Transportation (Non-Medical): No  Physical Activity: Not on file  Stress: Not on file  Social Connections: Patient Declined (12/11/2023)   Social Connection and Isolation Panel [NHANES]    Frequency of Communication with Friends and Family: Patient declined    Frequency of Social Gatherings with Friends and Family: Patient declined    Attends Religious Services: Patient declined    Database administrator or Organizations: Patient declined     Attends Engineer, structural: Patient declined    Marital Status: Patient declined    Family History: Family History  Problem Relation Age of Onset   Arthritis Mother    Hypertension Mother    Diabetes Mother    Cervical cancer Mother    Alcohol abuse Father    Arthritis Father    Hypertension Father    Arthritis Maternal Grandmother    Heart disease Maternal Grandmother    Stroke Maternal Grandmother    Hypertension Maternal Grandmother    Diabetes Maternal Grandmother    Arthritis Maternal Grandfather    Colon cancer Maternal Grandfather    Prostate cancer Maternal Grandfather    Heart disease Maternal Grandfather    Stroke Maternal Grandfather    Hypertension Maternal Grandfather    Arthritis Paternal Grandmother    Heart disease Paternal Grandmother    Hypertension Paternal Grandmother    Diabetes Paternal Grandmother    Arthritis Paternal Grandfather    Heart disease Paternal Grandfather    Hypertension Paternal Grandfather    Thyroid disease Maternal Aunt        thyroid cancer    Allergies: Allergies  Allergen Reactions   Latex Itching and Rash    Medications Prior to Admission  Medication Sig Dispense Refill Last Dose/Taking   aspirin EC 81 MG tablet Take 2 tablets (162 mg total) by mouth at bedtime. Start taking when you are [redacted] weeks pregnant for rest of pregnancy for prevention of preeclampsia 300 tablet 2 12/10/2023   insulin glargine (LANTUS) 100 UNIT/ML Solostar Pen Inject 6 Units into the skin at bedtime. 15 mL 1 12/10/2023   labetalol (NORMODYNE) 300 MG tablet Take 1 tablet (300 mg total) by mouth 3 (three) times daily. 270 tablet 3 12/11/2023 at  5:00 AM   ondansetron (ZOFRAN-ODT) 4 MG disintegrating tablet Take 1 tablet (4 mg total) by mouth every 6 (six) hours as needed for nausea. 20 tablet 0 12/10/2023   Prenatal Vit-Fe Fumarate-FA (PRENATAL MULTIVITAMIN) TABS tablet Take 1 tablet by mouth daily at 12 noon.   Past Week   albuterol (VENTOLIN  HFA) 108 (90 Base) MCG/ACT inhaler Inhale 1 puff into the lungs every 6 (six) hours as needed for wheezing or shortness of breath.      Continuous Glucose Sensor (DEXCOM G6 SENSOR) MISC 1 Package by Does not apply route every 30 (thirty) days. 3 each 6  Continuous Glucose Transmitter (DEXCOM G6 TRANSMITTER) MISC 1 Package by Does not apply route every 3 (three) months. 1 each 2    furosemide (LASIX) 40 MG tablet Take 1 tablet (40 mg total) by mouth daily for 5 days. 5 tablet 0    Insulin Pen Needle (PEN NEEDLES) 33G X 4 MM MISC Please use as directed 100 each 5    potassium chloride SA (KLOR-CON M20) 20 MEQ tablet Take 1 tablet (20 mEq total) by mouth daily for 5 days. 5 tablet 0      Review of Systems   All systems reviewed and negative except as stated in HPI  Blood pressure 125/73, pulse 85, temperature 98.3 F (36.8 C), temperature source Oral, resp. rate 18, last menstrual period 04/08/2023, SpO2 97%, unknown if currently breastfeeding. General appearance: alert, cooperative, and appears stated age Lungs: clear to auscultation bilaterally Heart: regular rate and rhythm Abdomen: soft, non-tender; bowel sounds normal Pelvic: see cervical exam Extremities: Homans sign is negative, no sign of DVT Presentation: cephalic Fetal monitoringBaseline: 130 bpmmoderate variability; accelerations no decelerations  Uterine activityFrequency: Every 3-4 minutes Dilation: 2 Effacement (%): 50 Station: Ballotable Exam by:: [employer], RN   Prenatal labs: ABO, Rh: --/--/A POS (03/19 0723) Antibody: NEG (03/19 0723) Rubella: 4.51 (09/16 1431) RPR: Non Reactive (09/16 1431)  HBsAg: Negative (09/16 1431)  HIV: Non Reactive (09/16 1431)  GBS: Negative/-- (02/27 1520)    Lab Results  Component Value Date   GBS Negative 11/21/2023   GTT GDM on insulin  Genetic screening  LR F Anatomy US WNL   Immunization History  Administered Date(s) Administered   Tdap 01/23/2017    Prenatal  Transfer Tool  Maternal Diabetes: Yes:  Diabetes Type:  Insulin/Medication controlled Genetic Screening: Normal Maternal Ultrasounds/Referrals: Normal Fetal Ultrasounds or other Referrals:  Referred to [facility]  Maternal Substance Abuse:  No Significant Maternal Medications:  Meds include: Other: lasix, labetalol, lantus, aspirin  Significant Maternal Lab Results: Group B Strep negative Number of Prenatal Visits:greater than 3 verified prenatal visits Maternal Vaccinations:declined flu, covid, rsv Other Comments:  None   Results for orders placed or performed during the hospital encounter of 12/11/23 (from the past 24 hours)  POCT fern test   Collection Time: 12/11/23  7:04 AM  Result Value Ref Range   POCT Fern Test Positive = ruptured amniotic membanes   CBC   Collection Time: 12/11/23  7:23 AM  Result Value Ref Range   WBC 13.8 (H) 4.0 - 10.5 K/uL   RBC 3.83 (L) 3.87 - 5.11 MIL/uL   Hemoglobin 11.0 (L) 12.0 - 15.0 g/dL   HCT 13.2 (L) 44.0 - 10.2 %   MCV 88.5 80.0 - 100.0 fL   MCH 28.7 26.0 - 34.0 pg   MCHC 32.4 30.0 - 36.0 g/dL   RDW 72.5 36.6 - 44.0 %   Platelets 244 150 - 400 K/uL   nRBC 0.0 0.0 - 0.2 %  Comprehensive metabolic panel   Collection Time: 12/11/23  7:23 AM  Result Value Ref Range   Sodium 135 135 - 145 mmol/L   Potassium 4.2 3.5 - 5.1 mmol/L   Chloride 102 98 - 111 mmol/L   CO2 23 22 - 32 mmol/L   Glucose, Bld 173 (H) 70 - 99 mg/dL   BUN 8 6 - 20 mg/dL   Creatinine, Ser 3.47 0.44 - 1.00 mg/dL   Calcium 9.0 8.9 - 42.5 mg/dL   Total Protein 5.6 (L) 6.5 - 8.1 g/dL  Albumin 2.4 (L) 3.5 - 5.0 g/dL   AST 19 15 - 41 U/L   ALT 12 0 - 44 U/L   Alkaline Phosphatase 85 38 - 126 U/L   Total Bilirubin 0.3 0.0 - 1.2 mg/dL   GFR, Estimated >16 >10 mL/min   Anion gap 10 5 - 15  Type and screen   Collection Time: 12/11/23  7:23 AM  Result Value Ref Range   ABO/RH(D) A POS    Antibody Screen NEG    Sample Expiration       12/14/2023,2359 Performed at [facility], [address], [place], [place] [address]     Patient Active Problem List   Diagnosis Date Noted   Chronic hypertension during pregnancy, antepartum 12/11/2023   AMA (advanced maternal age) multigravida 35+, third trimester 11/12/2023   Excessive fetal growth affecting management of mother in third trimester, antepartum 10/28/2023   PSVT (paroxysmal supraventricular tachycardia) (HCC) 10/28/2023   Gestational diabetes mellitus, antepartum 07/08/2023   Preexisting hypertension complicating pregnancy, antepartum 06/10/2023   Supervision of high risk pregnancy, antepartum 05/29/2023   BMI 50.0-59.9, adult (HCC) 05/29/2023   Uncomplicated asthma 09/16/2016   Maternal morbid obesity, antepartum (HCC) 01/14/2016    Assessment/Plan:  Yocelyn Brocious is a 38 y.o. [id number] at [redacted]w[redacted]d here for SROM. Induction planned for 3/20. Presented with SROM 3 am with clear fluid.    #Labor: SROM 3 am.  #HTN #PSVT continue home labetalol 300 TID  #Pain: Well controlled. Pt hoping to try without epidural but TBD.  #FWB: Reactive  #GBS status:  negative #Feeding: Breastmilk  #Reproductive Life planning: Vasectomy #Circ:  not applicable  Denton Ar, MD  12/11/2023, 10:13 AM  Midwife attestation: I have seen and examined this patient; I agree with above documentation in the resident's note.   Darlisa Spruiell is a 38 y.o. [id number] here for PROM/early labor.  PE: BP (!) 150/86   Pulse 84   Temp 98.7 F (37.1 C) (Oral)   Resp 18   LMP 04/08/2023   SpO2 97%  Gen: calm comfortable, NAD Resp: normal effort, no distress Abd: gravid  ROS, labs, PMH reviewed  Plan: Admit to LD Labor: expectant management with shared decision making.  Pt prefers to avoid Pitocin but is Ok with augmentation with Cytotec or foley balloon if needed. Fetal monitoring: Category I ID: GBS negative  Sharen Counter, CNM  12/11/2023, 2:46 PM

## 2023-12-11 NOTE — MAU Note (Signed)
.  Jane Jackson is a 38 y.o. at [redacted]w[redacted]d here in MAU reporting: SROM around 84 - clear fluid. Ctx for a couple of days but more intense an hour after her water broke. Denies VB. +FM  Onset of complaint: 0350 Pain score: 10 Vitals:   12/11/23 0656  BP: (!) 155/96  Pulse: 92  Resp: 18  Temp: 98.3 F (36.8 C)  SpO2: 98%     FHT: 142  Lab orders placed from triage: labor eval

## 2023-12-12 ENCOUNTER — Inpatient Hospital Stay (HOSPITAL_COMMUNITY): Admitting: Anesthesiology

## 2023-12-12 ENCOUNTER — Inpatient Hospital Stay (HOSPITAL_COMMUNITY): Payer: 59

## 2023-12-12 ENCOUNTER — Encounter (HOSPITAL_COMMUNITY): Payer: Self-pay | Admitting: Obstetrics and Gynecology

## 2023-12-12 ENCOUNTER — Inpatient Hospital Stay (HOSPITAL_COMMUNITY): Admission: RE | Admit: 2023-12-12 | Payer: 59 | Source: Home / Self Care | Admitting: Family Medicine

## 2023-12-12 LAB — PROTEIN / CREATININE RATIO, URINE
Creatinine, Urine: 234 mg/dL
Protein Creatinine Ratio: 0.17 mg/mg{creat} — ABNORMAL HIGH (ref 0.00–0.15)
Total Protein, Urine: 39 mg/dL

## 2023-12-12 LAB — COMPREHENSIVE METABOLIC PANEL
ALT: 11 U/L (ref 0–44)
AST: 21 U/L (ref 15–41)
Albumin: 2.3 g/dL — ABNORMAL LOW (ref 3.5–5.0)
Alkaline Phosphatase: 90 U/L (ref 38–126)
Anion gap: 9 (ref 5–15)
BUN: 8 mg/dL (ref 6–20)
CO2: 21 mmol/L — ABNORMAL LOW (ref 22–32)
Calcium: 8.4 mg/dL — ABNORMAL LOW (ref 8.9–10.3)
Chloride: 105 mmol/L (ref 98–111)
Creatinine, Ser: 0.78 mg/dL (ref 0.44–1.00)
GFR, Estimated: >60 mL/min (ref >60)
Glucose, Bld: 153 mg/dL — ABNORMAL HIGH (ref 70–99)
Potassium: 4 mmol/L (ref 3.5–5.1)
Sodium: 135 mmol/L (ref 135–145)
Total Bilirubin: 0.2 mg/dL (ref 0.0–1.2)
Total Protein: 5.3 g/dL — ABNORMAL LOW (ref 6.5–8.1)

## 2023-12-12 LAB — CBC
HCT: 31.4 % — ABNORMAL LOW (ref 36.0–46.0)
Hemoglobin: 10.2 g/dL — ABNORMAL LOW (ref 12.0–15.0)
MCH: 28.7 pg (ref 26.0–34.0)
MCHC: 32.5 g/dL (ref 30.0–36.0)
MCV: 88.5 fL (ref 80.0–100.0)
Platelets: 206 10*3/uL (ref 150–400)
RBC: 3.55 MIL/uL — ABNORMAL LOW (ref 3.87–5.11)
RDW: 14 % (ref 11.5–15.5)
WBC: 12.2 10*3/uL — ABNORMAL HIGH (ref 4.0–10.5)
nRBC: 0 % (ref 0.0–0.2)

## 2023-12-12 LAB — GLUCOSE, CAPILLARY
Glucose-Capillary: 94 mg/dL (ref 70–99)
Glucose-Capillary: 136 mg/dL — ABNORMAL HIGH (ref 70–99)

## 2023-12-12 MED ORDER — FENTANYL CITRATE (PF) 100 MCG/2ML IJ SOLN
100.0000 ug | INTRAMUSCULAR | Status: AC | PRN
  Administered 2023-12-12 – 2023-12-13 (×2): 100 ug via INTRAVENOUS
  Filled 2023-12-12 (×2): qty 2

## 2023-12-12 MED ORDER — FENTANYL-BUPIVACAINE-NACL 0.5-0.125-0.9 MG/250ML-% EP SOLN: 12.0000 mL/h | EPIDURAL | Status: DC | PRN

## 2023-12-12 MED ORDER — EPHEDRINE 5 MG/ML INJ: 10.0000 mg | INTRAVENOUS | Status: DC | PRN

## 2023-12-12 MED ORDER — MISOPROSTOL 50MCG HALF TABLET: 50.0000 ug | ORAL_TABLET | Freq: Once | ORAL | Status: DC

## 2023-12-12 MED ORDER — METOCLOPRAMIDE HCL 5 MG/ML IJ SOLN
10.0000 mg | Freq: Once | INTRAMUSCULAR | Status: AC
  Administered 2023-12-12: 10 mg via INTRAVENOUS
  Filled 2023-12-12: qty 2

## 2023-12-12 MED ORDER — FENTANYL CITRATE (PF) 100 MCG/2ML IJ SOLN
INTRAMUSCULAR | Status: AC
  Filled 2023-12-12: qty 2

## 2023-12-12 MED ORDER — INSULIN GLARGINE 100 UNIT/ML ~~LOC~~ SOLN
8.0000 [IU] | Freq: Every day | SUBCUTANEOUS | Status: DC
  Administered 2023-12-12 – 2023-12-15 (×4): 8 [IU] via SUBCUTANEOUS
  Filled 2023-12-12 (×5): qty 0.08

## 2023-12-12 MED ORDER — LACTATED RINGERS IV SOLN: 500.0000 mL | Freq: Once | INTRAVENOUS | Status: DC

## 2023-12-12 MED ORDER — LIDOCAINE HCL (PF) 1 % IJ SOLN
INTRAMUSCULAR | Status: DC | PRN
  Administered 2023-12-12: 8 mL via EPIDURAL

## 2023-12-12 MED ORDER — MISOPROSTOL 50MCG HALF TABLET
50.0000 ug | ORAL_TABLET | Freq: Once | ORAL | Status: AC
  Administered 2023-12-12: 50 ug via ORAL
  Filled 2023-12-12: qty 1

## 2023-12-12 MED ORDER — FENTANYL CITRATE (PF) 100 MCG/2ML IJ SOLN
100.0000 ug | INTRAMUSCULAR | Status: AC | PRN
  Administered 2023-12-12 (×2): 100 ug via INTRAVENOUS
  Filled 2023-12-12 (×2): qty 2

## 2023-12-12 MED ORDER — NALBUPHINE HCL 10 MG/ML IJ SOLN
10.0000 mg | INTRAMUSCULAR | Status: DC | PRN
  Administered 2023-12-12 (×2): 10 mg via INTRAVENOUS
  Filled 2023-12-12 (×2): qty 1

## 2023-12-12 MED ORDER — PHENYLEPHRINE 80 MCG/ML (10ML) SYRINGE FOR IV PUSH (FOR BLOOD PRESSURE SUPPORT): 80.0000 ug | PREFILLED_SYRINGE | INTRAVENOUS | Status: DC | PRN

## 2023-12-12 MED ORDER — FENTANYL CITRATE (PF) 100 MCG/2ML IJ SOLN
INTRAMUSCULAR | Status: AC
  Administered 2023-12-12: 100 ug
  Filled 2023-12-12: qty 2

## 2023-12-12 MED ORDER — LACTATED RINGERS IV SOLN: 500.0000 mL | INTRAVENOUS | Status: AC | PRN

## 2023-12-12 MED ORDER — FENTANYL-BUPIVACAINE-NACL 0.5-0.125-0.9 MG/250ML-% EP SOLN
12.0000 mL/h | EPIDURAL | Status: DC | PRN
  Administered 2023-12-12: 12 mL/h via EPIDURAL
  Filled 2023-12-12 (×3): qty 250

## 2023-12-12 MED ORDER — LACTATED RINGERS IV SOLN: INTRAVENOUS | Status: AC

## 2023-12-12 MED ORDER — PHENYLEPHRINE 80 MCG/ML (10ML) SYRINGE FOR IV PUSH (FOR BLOOD PRESSURE SUPPORT)
80.0000 ug | PREFILLED_SYRINGE | INTRAVENOUS | Status: DC | PRN
  Administered 2023-12-13: 80 ug via INTRAVENOUS
  Filled 2023-12-12: qty 10

## 2023-12-12 MED ORDER — DIPHENHYDRAMINE HCL 50 MG/ML IJ SOLN: 12.5000 mg | INTRAMUSCULAR | Status: DC | PRN

## 2023-12-12 MED ORDER — INSULIN GLARGINE 100 UNITS/ML SOLOSTAR PEN
8.0000 [IU] | PEN_INJECTOR | SUBCUTANEOUS | Status: DC
Start: 2023-12-12 — End: 2023-12-12

## 2023-12-12 NOTE — Progress Notes (Addendum)
 Jane Jackson is a 38 y.o. [id number] at [redacted]w[redacted]d.  Subjective: Requesting Nubain for pain. Explained that we want to avoid giving it in late labor due to being longer lasting medicine sp I hoghly recommend cervical exam first. Agrees to SVE  Objective: BP 130/67   Pulse 88   Temp 98.5 F (36.9 C) (Oral)   Resp 16   LMP 04/08/2023   SpO2 99%    FHT:  FHR: 145 bpm, variability: mod,  accelerations:  15x15,  decelerations:  none UC:   Q 2-5 minutes, mod. Contractions very difficult to assess due to maternal body habitus. Toco adjusted.  Dilation: 3 Effacement (%): 70 Station: Ballotable Presentation: Vertex Exam by:: v Lenni Reckner,cnm  Labs: Results for orders placed or performed during the hospital encounter of 12/11/23 (from the past 24 hours)  CBC     Status: Abnormal   Collection Time: 12/12/23 12:29 PM  Result Value Ref Range   WBC 12.2 (H) 4.0 - 10.5 K/uL   RBC 3.55 (L) 3.87 - 5.11 MIL/uL   Hemoglobin 10.2 (L) 12.0 - 15.0 g/dL   HCT 14.7 (L) 82.9 - 56.2 %   MCV 88.5 80.0 - 100.0 fL   MCH 28.7 26.0 - 34.0 pg   MCHC 32.5 30.0 - 36.0 g/dL   RDW 13.0 86.5 - 78.4 %   Platelets 206 150 - 400 K/uL   nRBC 0.0 0.0 - 0.2 %  Comprehensive metabolic panel     Status: Abnormal   Collection Time: 12/12/23 12:29 PM  Result Value Ref Range   Sodium 135 135 - 145 mmol/L   Potassium 4.0 3.5 - 5.1 mmol/L   Chloride 105 98 - 111 mmol/L   CO2 21 (L) 22 - 32 mmol/L   Glucose, Bld 153 (H) 70 - 99 mg/dL   BUN 8 6 - 20 mg/dL   Creatinine, Ser 6.96 0.44 - 1.00 mg/dL   Calcium 8.4 (L) 8.9 - 10.3 mg/dL   Total Protein 5.3 (L) 6.5 - 8.1 g/dL   Albumin 2.3 (L) 3.5 - 5.0 g/dL   AST 21 15 - 41 U/L   ALT 11 0 - 44 U/L   Alkaline Phosphatase 90 38 - 126 U/L   Total Bilirubin 0.2 0.0 - 1.2 mg/dL   GFR, Estimated >29 >52 mL/min   Anion gap 9 5 - 15    Assessment / Plan: [redacted]w[redacted]d week IUP ROM x Days: 1 Hours: 10 Minutes: 1. No evidence of chorio.  Labor: Early, progressing slowly w/ Cytotec. Strongly  wished to avoid pitocin due to previous baby having decels in labor. Explained that babies can have decels regardless of whether contractions are spontaneous or induced and we could turn off pitocin if needed. Still declines. Agreeable to additional cytotec. Very difficult to trace UC's. No decels. Considering IUPC to better assess contractions and guide interventions but it currently would not change plan and pt hopes to minimize interventions.  Fetal Wellbeing:  Category I Pain Control:  May have Nubain. Pt hopes to avoid epidural since epidurals with previous labors were one-sided. Labs updated.  Anticipated MOD:  Uncertain due to suspected LGA, slow progress and prolonged ROM. Encouraged pt to seek our positions and movements that encourage contractions to help align with her desires to avoid pitocin. Dr. Alvester Morin updated.     Katrinka Blazing, [place], CNM 12/12/2023 1:51 PM

## 2023-12-12 NOTE — Anesthesia Preprocedure Evaluation (Signed)
 Anesthesia Evaluation  Patient identified by MRN, date of birth, ID band Patient awake    Reviewed: Allergy & Precautions, H&P , NPO status , Patient's Chart, lab work & pertinent test results, reviewed documented beta blocker date and time   Airway Mallampati: I  TM Distance: >3 FB Neck ROM: full    Dental no notable dental hx. (+) Teeth Intact, Dental Advisory Given, Caps   Pulmonary neg pulmonary ROS, former smoker   Pulmonary exam normal breath sounds clear to auscultation       Cardiovascular hypertension, Pt. on medications negative cardio ROS Normal cardiovascular exam Rhythm:regular Rate:Normal     Neuro/Psych negative neurological ROS  negative psych ROS   GI/Hepatic negative GI ROS, Neg liver ROS,,,  Endo/Other  diabetes  Class 4 obesity  Renal/GU negative Renal ROS  negative genitourinary   Musculoskeletal   Abdominal   Peds  Hematology negative hematology ROS (+)   Anesthesia Other Findings   Reproductive/Obstetrics (+) Pregnancy                             Anesthesia Physical Anesthesia Plan  ASA: 3  Anesthesia Plan: Epidural   Post-op Pain Management: Minimal or no pain anticipated   Induction: Intravenous  PONV Risk Score and Plan:   Airway Management Planned: Natural Airway  Additional Equipment: Fetal Monitoring  Intra-op Plan:   Post-operative Plan:   Informed Consent: I have reviewed the patients History and Physical, chart, labs and discussed the procedure including the risks, benefits and alternatives for the proposed anesthesia with the patient or authorized representative who has indicated his/her understanding and acceptance.     Dental Advisory Given  Plan Discussed with: Anesthesiologist  Anesthesia Plan Comments: (Labs checked- platelets confirmed with RN in room. Fetal heart tracing, per RN, reported to be stable enough for sitting  procedure. Discussed epidural, and patient consents to the procedure:  included risk of possible headache,backache, failed block, allergic reaction, and nerve injury. This patient was asked if she had any questions or concerns before the procedure started.)       Anesthesia Quick Evaluation

## 2023-12-12 NOTE — Progress Notes (Signed)
 Jane Jackson is a 39 y.o. G2X5284 at [redacted]w[redacted]d.  Subjective: Much more uncomfortable with contractions. Nitrous not helping, but pt coping adequately.   Objective: BP (!) 142/66   Pulse 87   Temp 98.3 F (36.8 C) (Oral)   Resp 16   LMP 04/08/2023   SpO2 99%    FHT:  FHR: 145 bpm, variability: mod,  accelerations:  15x15,  decelerations:  none UC:   Q 2-4 minutes, mod (not tracing well on toco--toco adjusted) Declined exam Presentation: Vertex (confirmed by ultrasound)  Labs: Results for orders placed or performed during the hospital encounter of 12/11/23 (from the past 24 hours)  Glucose, capillary     Status: Abnormal   Collection Time: 12/11/23 12:01 PM  Result Value Ref Range   Glucose-Capillary 125 (H) 70 - 99 mg/dL    Assessment / Plan: [redacted]w[redacted]d week IUP ROM x Days: 1 Hours: 8 Minutes: 3 Labor: Early, contractions closer and more painful with Cytotec Fetal Wellbeing:  Category I Pain Control:  Nitrous, comfort measures Anticipated MOD:  SVD  Katrinka Blazing, [place], CNM 12/12/2023 10:00 AM

## 2023-12-12 NOTE — Progress Notes (Signed)
 Comfortable w/epidural.  BS 122.  [id number].  Ctx q 3 minutes.  Cx 5/80/-3.  Pt wants to continue expectant mgt.

## 2023-12-12 NOTE — Progress Notes (Signed)
 Patient ID: Jane Jackson, female   DOB: 04-19-1986, 38 y.o.   MRN: [id number] Jane Jackson is a 38 y.o. [id number] at [redacted]w[redacted]d.  Subjective: Pt laying on left side, feeling contractions intermittently that are painful but not consistent. Off and on with certain position she will feel intermittent pressure.   Objective: BP (!) 147/66   Pulse 75   Temp 98.6 F (37 C) (Oral)   Resp 16   LMP 04/08/2023   SpO2 99%    FHT:  FHR: 125 bpm, variability: moderate,  accelerations:  present,  decelerations:  none UC:   Q 8 minutes, Irregular Dilation: 2 Effacement (%): 50 Station: Ballotable Presentation: Undeterminable Exam by:: [employer], RN  Labs: Results for orders placed or performed during the hospital encounter of 12/11/23 (from the past 24 hours)  POCT fern test     Status: Abnormal   Collection Time: 12/11/23  7:04 AM  Result Value Ref Range   POCT Fern Test Positive = ruptured amniotic membanes   CBC     Status: Abnormal   Collection Time: 12/11/23  7:23 AM  Result Value Ref Range   WBC 13.8 (H) 4.0 - 10.5 K/uL   RBC 3.83 (L) 3.87 - 5.11 MIL/uL   Hemoglobin 11.0 (L) 12.0 - 15.0 g/dL   HCT 02.7 (L) 25.3 - 66.4 %   MCV 88.5 80.0 - 100.0 fL   MCH 28.7 26.0 - 34.0 pg   MCHC 32.4 30.0 - 36.0 g/dL   RDW 40.3 47.4 - 25.9 %   Platelets 244 150 - 400 K/uL   nRBC 0.0 0.0 - 0.2 %  Type and screen     Status: None   Collection Time: 12/11/23  7:23 AM  Result Value Ref Range   ABO/RH(D) A POS    Antibody Screen NEG    Sample Expiration      12/14/2023,2359 Performed at [facility], [address] [place], [place] [address]   RPR     Status: None   Collection Time: 12/11/23  7:23 AM  Result Value Ref Range   RPR Ser Ql NON REACTIVE NON REACTIVE  Comprehensive metabolic panel     Status: Abnormal   Collection Time: 12/11/23  7:23 AM  Result Value Ref Range   Sodium 135 135 - 145 mmol/L   Potassium 4.2 3.5 - 5.1 mmol/L   Chloride 102 98 - 111 mmol/L   CO2 23 22  - 32 mmol/L   Glucose, Bld 173 (H) 70 - 99 mg/dL   BUN 8 6 - 20 mg/dL   Creatinine, Ser 5.64 0.44 - 1.00 mg/dL   Calcium 9.0 8.9 - 33.2 mg/dL   Total Protein 5.6 (L) 6.5 - 8.1 g/dL   Albumin 2.4 (L) 3.5 - 5.0 g/dL   AST 19 15 - 41 U/L   ALT 12 0 - 44 U/L   Alkaline Phosphatase 85 38 - 126 U/L   Total Bilirubin 0.3 0.0 - 1.2 mg/dL   GFR, Estimated >95 >18 mL/min   Anion gap 10 5 - 15  Glucose, capillary     Status: Abnormal   Collection Time: 12/11/23 12:01 PM  Result Value Ref Range   Glucose-Capillary 125 (H) 70 - 99 mg/dL    Assessment / Plan: [redacted]w[redacted]d week IUP ROM x Days: 1 Hours: 2 Minutes: 21 GBS neg Labor: Latent labor with prolonged ROM. Pt declining cervical exam and interventions for augmentation. She is persistent on wanting to continue spontanous labor with nipple  stimulation/pumping and position changes because she feels like they are working. Discussed risks about prolonged rupture and latent labor with no signs of active labor. Pt feels open to foley balloon for augmentation, but is declining interventions for now. BG at 0200 was 127, continue Q4h BG checks.  Fetal Wellbeing:  Category 1 Pain Control:  IV, nitrous oxide, epidural prn per pt request Anticipated MOD:  SVD  Herminio Commons, Student-MidWife 12/12/2023 6:11 AM

## 2023-12-12 NOTE — Progress Notes (Signed)
Blood glucose 122

## 2023-12-12 NOTE — Anesthesia Procedure Notes (Signed)
 Epidural Patient location during procedure: OB Start time: 12/12/2023 6:03 PM End time: 12/12/2023 6:08 PM  Staffing Anesthesiologist: Bethena Midget, MD  Preanesthetic Checklist Completed: patient identified, IV checked, site marked, risks and benefits discussed, surgical consent, monitors and equipment checked, pre-op evaluation and timeout performed  Epidural Patient position: sitting Prep: DuraPrep and site prepped and draped Patient monitoring: continuous pulse ox and blood pressure Approach: midline Location: L4-L5 Injection technique: LOR air  Needle:  Needle type: Tuohy  Needle gauge: 17 G Needle length: 9 cm and 9 Needle insertion depth: 9 cm Catheter type: closed end flexible Catheter size: 19 Gauge Catheter at skin depth: 15 cm Test dose: negative  Assessment Events: blood not aspirated, no cerebrospinal fluid, injection not painful, no injection resistance, no paresthesia and negative IV test

## 2023-12-12 NOTE — Progress Notes (Signed)
 Blood glucose 122 at 1400

## 2023-12-12 NOTE — Progress Notes (Signed)
 Patient ID: Jane Jackson, female   DOB: 02-27-1986, 38 y.o.   MRN: [id number]  Has bouts of ctx that are close and strong, but then they fizzle out after a while; has used [employer] and nipple stim w the pump   BPs 147/66, 137/62 T 98.6 FHR 120s, +accels, no decels, Cat 1 Ctx irreg Cx deferred (vtx verified by bedside u/s now)  Dexcom: all values <125  IUP@38 .3wks cHTN (Lab 300 tid) A2/B GDM PROM x 28h  Long discussion including every idea and option, including the possibility of going home to await labor; she and spouse understand the risks of prolonged rupture; she had Pitocin with baby #2 requiring a VAVD due to FHR concerns and she feels the Pitocin was responsible; she is agreeable to cytotec orally; will continue home Labetalol dosing (when she declined meds yesterday she had some severe range BPs that came down w oral Lab); has showered; will now get cytotec, eat, then rest and re-eval with day team ~noon  [name] [facility] 12/12/2023 7:35 AM

## 2023-12-12 NOTE — Progress Notes (Signed)
 Patient ID: Jane Jackson, female   DOB: 03/25/1986, 38 y.o.   MRN: [id number] Jane Jackson is a 38 y.o. [id number] at [redacted]w[redacted]d.  Subjective: Pt laying on right side using nitrous intermittently for pain control.  Objective: BP (!) 143/71   Pulse 81   Temp 98.1 F (36.7 C) (Oral)   Resp 16   LMP 04/08/2023   SpO2 99%    FHT:  FHR: 130 bpm, variability: moderate,  accelerations:  present,  decelerations:  none UC:   Q 6-7 minutes, irregular Dilation: 2 Effacement (%): 50 Station: Ballotable Presentation: Undeterminable Exam by:: [employer], RN  Labs: Results for orders placed or performed during the hospital encounter of 12/11/23 (from the past 24 hours)  POCT fern test     Status: Abnormal   Collection Time: 12/11/23  7:04 AM  Result Value Ref Range   POCT Fern Test Positive = ruptured amniotic membanes   CBC     Status: Abnormal   Collection Time: 12/11/23  7:23 AM  Result Value Ref Range   WBC 13.8 (H) 4.0 - 10.5 K/uL   RBC 3.83 (L) 3.87 - 5.11 MIL/uL   Hemoglobin 11.0 (L) 12.0 - 15.0 g/dL   HCT 13.2 (L) 44.0 - 10.2 %   MCV 88.5 80.0 - 100.0 fL   MCH 28.7 26.0 - 34.0 pg   MCHC 32.4 30.0 - 36.0 g/dL   RDW 72.5 36.6 - 44.0 %   Platelets 244 150 - 400 K/uL   nRBC 0.0 0.0 - 0.2 %  Type and screen     Status: None   Collection Time: 12/11/23  7:23 AM  Result Value Ref Range   ABO/RH(D) A POS    Antibody Screen NEG    Sample Expiration      12/14/2023,2359 Performed at [facility], [address] [place], [place] [address]   RPR     Status: None   Collection Time: 12/11/23  7:23 AM  Result Value Ref Range   RPR Ser Ql NON REACTIVE NON REACTIVE  Comprehensive metabolic panel     Status: Abnormal   Collection Time: 12/11/23  7:23 AM  Result Value Ref Range   Sodium 135 135 - 145 mmol/L   Potassium 4.2 3.5 - 5.1 mmol/L   Chloride 102 98 - 111 mmol/L   CO2 23 22 - 32 mmol/L   Glucose, Bld 173 (H) 70 - 99 mg/dL   BUN 8 6 - 20 mg/dL   Creatinine, Ser  5.95 0.44 - 1.00 mg/dL   Calcium 9.0 8.9 - 63.8 mg/dL   Total Protein 5.6 (L) 6.5 - 8.1 g/dL   Albumin 2.4 (L) 3.5 - 5.0 g/dL   AST 19 15 - 41 U/L   ALT 12 0 - 44 U/L   Alkaline Phosphatase 85 38 - 126 U/L   Total Bilirubin 0.3 0.0 - 1.2 mg/dL   GFR, Estimated >75 >64 mL/min   Anion gap 10 5 - 15  Glucose, capillary     Status: Abnormal   Collection Time: 12/11/23 12:01 PM  Result Value Ref Range   Glucose-Capillary 125 (H) 70 - 99 mg/dL    Assessment / Plan: [redacted]w[redacted]d week IUP ROM x Hours: 21 Minutes: 54 GBS neg Labor: Early labor. Pt has tried nipple stimulation and some positions from the mile circuit. Discussed being ROM'd for almost 24 hours and the risk for infection with no signs of spontaneous active labor. Reviewed augmentation options such as foley balloon and  pitocin. Pt declines both at this time and would like to continue spontaneous labor using pumping and position changes, requesting SVE at 0600 or prn. Open to foley balloon if unchanged at 0600. Blood pressure is normotensive s/p oral labetalol. Continue Q4h BG checks in latent labor.  Fetal Wellbeing:  Category 1 Pain Control:  IV, nitrous oxide, epidural prn per pt request Anticipated MOD:  SVD  Herminio Commons, Student-MidWife 12/12/2023 1:44 AM

## 2023-12-13 ENCOUNTER — Encounter (HOSPITAL_COMMUNITY): Payer: Self-pay | Admitting: Obstetrics and Gynecology

## 2023-12-13 DIAGNOSIS — O4202 Full-term premature rupture of membranes, onset of labor within 24 hours of rupture: Secondary | ICD-10-CM | POA: Diagnosis not present

## 2023-12-13 DIAGNOSIS — O24424 Gestational diabetes mellitus in childbirth, insulin controlled: Secondary | ICD-10-CM | POA: Diagnosis not present

## 2023-12-13 DIAGNOSIS — O411230 Chorioamnionitis, third trimester, not applicable or unspecified: Secondary | ICD-10-CM

## 2023-12-13 DIAGNOSIS — Z3A38 38 weeks gestation of pregnancy: Secondary | ICD-10-CM

## 2023-12-13 DIAGNOSIS — O99214 Obesity complicating childbirth: Secondary | ICD-10-CM | POA: Diagnosis not present

## 2023-12-13 DIAGNOSIS — O1414 Severe pre-eclampsia complicating childbirth: Secondary | ICD-10-CM | POA: Diagnosis not present

## 2023-12-13 DIAGNOSIS — O09523 Supervision of elderly multigravida, third trimester: Secondary | ICD-10-CM

## 2023-12-13 DIAGNOSIS — O3663X0 Maternal care for excessive fetal growth, third trimester, not applicable or unspecified: Secondary | ICD-10-CM

## 2023-12-13 LAB — CBC
HCT: 33.9 % — ABNORMAL LOW (ref 36.0–46.0)
Hemoglobin: 11 g/dL — ABNORMAL LOW (ref 12.0–15.0)
MCH: 28.7 pg (ref 26.0–34.0)
MCHC: 32.4 g/dL (ref 30.0–36.0)
MCV: 88.5 fL (ref 80.0–100.0)
Platelets: 242 10*3/uL (ref 150–400)
RBC: 3.83 MIL/uL — ABNORMAL LOW (ref 3.87–5.11)
RDW: 14.4 % (ref 11.5–15.5)
WBC: 20.7 10*3/uL — ABNORMAL HIGH (ref 4.0–10.5)
nRBC: 0 % (ref 0.0–0.2)

## 2023-12-13 LAB — GLUCOSE, CAPILLARY: Glucose-Capillary: 112 mg/dL — ABNORMAL HIGH (ref 70–99)

## 2023-12-13 MED ORDER — ERYTHROMYCIN 5 MG/GM OP OINT
TOPICAL_OINTMENT | OPHTHALMIC | Status: AC
  Filled 2023-12-13: qty 1

## 2023-12-13 MED ORDER — OXYCODONE-ACETAMINOPHEN 5-325 MG PO TABS
1.0000 | ORAL_TABLET | ORAL | Status: DC | PRN
  Administered 2023-12-13: 1 via ORAL
  Filled 2023-12-13: qty 1

## 2023-12-13 MED ORDER — SODIUM CHLORIDE 0.9 % IV SOLN
2.0000 g | Freq: Four times a day (QID) | INTRAVENOUS | Status: AC
  Administered 2023-12-13 – 2023-12-14 (×5): 2 g via INTRAVENOUS
  Filled 2023-12-13 (×5): qty 2000

## 2023-12-13 MED ORDER — SODIUM CHLORIDE 0.9% FLUSH: 3.0000 mL | INTRAVENOUS | Status: DC | PRN

## 2023-12-13 MED ORDER — IBUPROFEN 600 MG PO TABS
600.0000 mg | ORAL_TABLET | Freq: Four times a day (QID) | ORAL | Status: DC
  Administered 2023-12-13 – 2023-12-18 (×12): 600 mg via ORAL
  Filled 2023-12-13 (×15): qty 1

## 2023-12-13 MED ORDER — SIMETHICONE 80 MG PO CHEW
80.0000 mg | CHEWABLE_TABLET | ORAL | Status: DC | PRN
  Administered 2023-12-17: 80 mg via ORAL
  Filled 2023-12-13: qty 1

## 2023-12-13 MED ORDER — ZOLPIDEM TARTRATE 5 MG PO TABS: 5.0000 mg | ORAL_TABLET | Freq: Every evening | ORAL | Status: DC | PRN

## 2023-12-13 MED ORDER — WITCH HAZEL-GLYCERIN EX PADS
1.0000 | MEDICATED_PAD | CUTANEOUS | Status: DC | PRN
  Administered 2023-12-18: 1 via TOPICAL

## 2023-12-13 MED ORDER — POTASSIUM CHLORIDE CRYS ER 20 MEQ PO TBCR
20.0000 meq | EXTENDED_RELEASE_TABLET | Freq: Every day | ORAL | Status: DC
  Administered 2023-12-14 – 2023-12-15 (×2): 20 meq via ORAL
  Filled 2023-12-13 (×2): qty 1

## 2023-12-13 MED ORDER — FUROSEMIDE 20 MG PO TABS
20.0000 mg | ORAL_TABLET | Freq: Once | ORAL | Status: AC
  Administered 2023-12-13: 20 mg via ORAL
  Filled 2023-12-13: qty 1

## 2023-12-13 MED ORDER — MEASLES, MUMPS & RUBELLA VAC IJ SOLR: 0.5000 mL | Freq: Once | INTRAMUSCULAR | Status: DC

## 2023-12-13 MED ORDER — FUROSEMIDE 20 MG PO TABS: 20.0000 mg | ORAL_TABLET | Freq: Every day | ORAL | Status: DC

## 2023-12-13 MED ORDER — SODIUM CHLORIDE 0.9% FLUSH
3.0000 mL | Freq: Two times a day (BID) | INTRAVENOUS | Status: DC
  Administered 2023-12-13 – 2023-12-18 (×5): 3 mL via INTRAVENOUS

## 2023-12-13 MED ORDER — FUROSEMIDE 10 MG/ML IJ SOLN
20.0000 mg | Freq: Once | INTRAMUSCULAR | Status: AC
  Administered 2023-12-14: 20 mg via INTRAVENOUS
  Filled 2023-12-13 (×2): qty 4

## 2023-12-13 MED ORDER — SENNOSIDES-DOCUSATE SODIUM 8.6-50 MG PO TABS
2.0000 | ORAL_TABLET | ORAL | Status: DC
  Administered 2023-12-16: 2 via ORAL
  Filled 2023-12-13 (×3): qty 2

## 2023-12-13 MED ORDER — GENTAMICIN SULFATE 40 MG/ML IJ SOLN
5.0000 mg/kg | INTRAVENOUS | Status: AC
  Administered 2023-12-13 – 2023-12-14 (×2): 500 mg via INTRAVENOUS
  Filled 2023-12-13 (×2): qty 12.5

## 2023-12-13 MED ORDER — OXYTOCIN-SODIUM CHLORIDE 30-0.9 UT/500ML-% IV SOLN
1.0000 m[IU]/min | INTRAVENOUS | Status: DC
  Administered 2023-12-13: 2 m[IU]/min via INTRAVENOUS
  Filled 2023-12-13: qty 500

## 2023-12-13 MED ORDER — ONDANSETRON HCL 4 MG PO TABS
4.0000 mg | ORAL_TABLET | ORAL | Status: DC | PRN
  Administered 2023-12-15: 4 mg via ORAL
  Filled 2023-12-13: qty 1

## 2023-12-13 MED ORDER — ONDANSETRON HCL 4 MG/2ML IJ SOLN: 4.0000 mg | INTRAMUSCULAR | Status: DC | PRN

## 2023-12-13 MED ORDER — OXYCODONE HCL 5 MG PO TABS
5.0000 mg | ORAL_TABLET | ORAL | Status: AC
  Administered 2023-12-13: 5 mg via ORAL
  Filled 2023-12-13: qty 1

## 2023-12-13 MED ORDER — PRENATAL MULTIVITAMIN CH
1.0000 | ORAL_TABLET | Freq: Every day | ORAL | Status: DC
  Administered 2023-12-14 – 2023-12-18 (×4): 1 via ORAL
  Filled 2023-12-13 (×5): qty 1

## 2023-12-13 MED ORDER — TETANUS-DIPHTH-ACELL PERTUSSIS 5-2.5-18.5 LF-MCG/0.5 IM SUSY: 0.5000 mL | PREFILLED_SYRINGE | Freq: Once | INTRAMUSCULAR | Status: DC

## 2023-12-13 MED ORDER — ACETAMINOPHEN 325 MG PO TABS
650.0000 mg | ORAL_TABLET | ORAL | Status: DC | PRN
  Administered 2023-12-14 – 2023-12-16 (×10): 650 mg via ORAL
  Filled 2023-12-13 (×10): qty 2

## 2023-12-13 MED ORDER — ALBUTEROL SULFATE (2.5 MG/3ML) 0.083% IN NEBU
2.5000 mg | INHALATION_SOLUTION | Freq: Four times a day (QID) | RESPIRATORY_TRACT | Status: DC | PRN
  Filled 2023-12-13: qty 3

## 2023-12-13 MED ORDER — BENZOCAINE-MENTHOL 20-0.5 % EX AERO: 1.0000 | INHALATION_SPRAY | CUTANEOUS | Status: DC | PRN

## 2023-12-13 MED ORDER — DIPHENHYDRAMINE HCL 25 MG PO CAPS: 25.0000 mg | ORAL_CAPSULE | Freq: Four times a day (QID) | ORAL | Status: DC | PRN

## 2023-12-13 MED ORDER — TERBUTALINE SULFATE 1 MG/ML IJ SOLN: 0.2500 mg | Freq: Once | INTRAMUSCULAR | Status: DC | PRN

## 2023-12-13 MED ORDER — FUROSEMIDE 10 MG/ML IJ SOLN
20.0000 mg | Freq: Once | INTRAMUSCULAR | Status: DC
  Filled 2023-12-13: qty 2

## 2023-12-13 MED ORDER — COCONUT OIL OIL
1.0000 | TOPICAL_OIL | Status: DC | PRN
  Administered 2023-12-18: 1 via TOPICAL

## 2023-12-13 MED ORDER — SODIUM CHLORIDE 0.9 % IV SOLN
250.0000 mL | INTRAVENOUS | Status: DC | PRN
  Administered 2023-12-13 – 2023-12-16 (×4): 250 mL via INTRAVENOUS

## 2023-12-13 MED ORDER — DIBUCAINE (PERIANAL) 1 % EX OINT: 1.0000 | TOPICAL_OINTMENT | CUTANEOUS | Status: DC | PRN

## 2023-12-13 MED ORDER — OXYCODONE HCL 5 MG PO TABS
5.0000 mg | ORAL_TABLET | Freq: Four times a day (QID) | ORAL | Status: DC | PRN
  Administered 2023-12-14 (×2): 5 mg via ORAL
  Filled 2023-12-13 (×2): qty 1

## 2023-12-13 NOTE — Progress Notes (Signed)
 Patient Vitals for the past 4 hrs:  BP Temp Temp src Pulse SpO2  12/13/23 0240 (!) 120/47 -- -- (!) 112 --  12/13/23 0235 (!) 151/84 -- -- (!) 112 --  12/13/23 0230 (!) 135/48 99 F (37.2 C) Axillary (!) 107 --  12/13/23 0200 137/74 -- -- 97 --  12/13/23 0130 139/77 -- -- 97 99 %  12/13/23 0105 -- -- -- -- 99 %  12/13/23 0100 (!) 145/95 -- -- (!) 109 99 %   Has had trouble getting comfortable w/epidural.  FHR 170s, moderate variability and + accels.  Ctx coupling q 1-4 minutes. APAP/Amp/Gent started.  Cx essentially the same per same RN exam.  Recommended pitocin augmentation. Pt wants to use breast pump, but states if that doesn't work, will consent to pitocin.

## 2023-12-13 NOTE — Progress Notes (Signed)
 Jane Jackson is a 38 y.o. [id number] at [redacted]w[redacted]d by ultrasound admitted for rupture of membranes  Subjective: Patient reports feeling better now that her epidural has been adjusted.   Objective: BP 134/64   Pulse 84   Temp 99.9 F (37.7 C) (Axillary)   Resp 16   LMP 04/08/2023   SpO2 99%    FHT:  FHR: 130 bpm, variability: moderate,  accelerations:  Present,  decelerations:  Absent UC:   irregular, every 3.5-5 minutes SVE:   Dilation: 8.5 Effacement (%): 70 Station: Plus 1 Exam by:: c Breklyn Fabrizio,snm  Labs: Lab Results  Component Value Date   WBC 12.2 (H) 12/12/2023   HGB 10.2 (L) 12/12/2023   HCT 31.4 (L) 12/12/2023   MCV 88.5 12/12/2023   PLT 206 12/12/2023    Assessment / Plan: Protracted active phase, Cat 1 FHT  Labor:  protracted labor , patient agreeable to low dose pitocin now.  Preeclampsia:  labs stable and BP stable Fetal Wellbeing:  Category I Pain Control:  Epidural I/D:   suspected chorio , on amp and gent. Anticipated MOD:  NSVD  Elige Ko, Student-MidWife 12/13/2023, 9:38 AM

## 2023-12-13 NOTE — Lactation Note (Addendum)
 This note was copied from a baby's chart. Lactation Consultation Note  Patient Name: Jane Jackson [id number] Date: 12/13/2023 Age:40 hours Reason for consult: Initial assessment;Early term 37-38.6wks (See MOB : MR- GDM, CHTN, AMA).  MOB informed LC that infant BF in LD multiple times for 1 hour 30 minutes,downstairs, had emesis, feels infant is full. MOB aware infant had low blood glucose of 39 mg/ dl. MOB attempted give infant EBM using [place] bottle nipple  infant would  only consumed 2 mls of EBM, became sleepy and MOB was doing skin to skin with infant when LC left the room. MOB does not want to use DEBP at this time prefers to latch infant only at the breast. MOB already knows how to hand express. LC discussed to continue to BF infant by cues, on demand, every 2-3 hours, skin to skin and knows she can give infant back EBM after infant latches at the breast if blood glucose continue to remain low. MOB is thinking about doing tandem nursing, she knows to always breastfeed the newborn first and then the toddler.  LC discussed the importance of maternal rest, balance meals and hydration. MOB was  made aware of O/P services, breastfeeding support groups, community resources, and our phone # for post-discharge questions.    Maternal Data Has patient been taught Hand Expression?: Yes Does the patient have breastfeeding experience prior to this delivery?: Yes How long did the patient breastfeed?: MOB is still breastfeeding her 66 month old toddler  Feeding Mother's Current Feeding Choice: Breast Milk  LATCH Score ( Done by RN in L&D).  Latch: Grasps breast easily, tongue down, lips flanged, rhythmical sucking.  Audible Swallowing: Spontaneous and intermittent  Type of Nipple: Everted at rest and after stimulation  Comfort (Breast/Nipple): Soft / non-tender  Hold (Positioning): No assistance needed to correctly position infant at breast.  LATCH Score: 10   Lactation Tools  Discussed/Used    Interventions Interventions: Breast feeding basics reviewed;Skin to skin;Hand express;Education;DEBP  Discharge    Consult Status Consult Status: Follow-up Date: 12/14/23 Follow-up type: In-patient    Frederico Hamman 12/13/2023, 10:30 PM

## 2023-12-13 NOTE — Progress Notes (Signed)
 Patient ID: Jane Jackson, female   DOB: [birth year], 38 y.o.   MRN: [id number]  BP (!) 152/53   Pulse 92   Temp 98.9 F (37.2 C) (Oral)   Resp 18   LMP 04/08/2023   SpO2 99%   Dilation: 10 Dilation Complete Date: 12/13/23 Dilation Complete Time: 1335 Effacement (%): 70 Station: Plus 1 Presentation: Vertex Exam by:: c Tanisha Lutes,snm  Patient reports feeling exhausted and is having spotty epidural coverage. She would like to get better pain control and rest before pushing. Patient to begin pushing in 1 hour. Anticipate NSVD.

## 2023-12-13 NOTE — Progress Notes (Signed)
   PRENATAL VISIT NOTE  Subjective:  Jane Jackson is a 38 y.o. [id number] at [redacted]w[redacted]d being seen today for ongoing prenatal care.  She is currently monitored for the following issues for this high-risk pregnancy and has Maternal morbid obesity, antepartum (HCC); Uncomplicated asthma; Supervision of high risk pregnancy, antepartum; BMI 50.0-59.9, adult (HCC); Preexisting hypertension complicating pregnancy, antepartum; Gestational diabetes mellitus, antepartum; Excessive fetal growth affecting management of mother in third trimester, antepartum; PSVT (paroxysmal supraventricular tachycardia) (HCC); AMA (advanced maternal age) multigravida 35+, third trimester; and Chronic hypertension during pregnancy, antepartum on their problem list.  Patient reports  decreased FM .  Contractions: Irritability. Vag. Bleeding: None.  Movement: (!) Decreased. Denies leaking of fluid.   The following portions of the patient's history were reviewed and updated as appropriate: allergies, current medications, past family history, past medical history, past social history, past surgical history and problem list.   Objective:   Vitals:   12/09/23 1614  BP: (!) 142/91  Pulse: 90  Weight: (!) 357 lb (161.9 kg)    Fetal Status: Fetal Heart Rate (bpm): 134   Movement: (!) Decreased     General:  Alert, oriented and cooperative. Patient is in no acute distress.  Skin: Skin is warm and dry. No rash noted.   Cardiovascular: Normal heart rate noted  Respiratory: Normal respiratory effort, no problems with respiration noted  Abdomen: Soft, gravid, appropriate for gestational age.  Pain/Pressure: Present     Pelvic: Cervical exam deferred        Extremities: Normal range of motion.  Edema: None  Mental Status: Normal mood and affect. Normal behavior. Normal judgment and thought content.   Assessment and Plan:  Pregnancy: [id number] at [redacted]w[redacted]d 1. Supervision of high risk pregnancy, antepartum (Primary) Up to date Discussed  IOl process in detail. Patient goal is not to have CS - Fetal nonstress test  2. Preexisting hypertension complicating pregnancy, antepartum Elevated but not taking meds regularly  3. Insulin controlled gestational diabetes mellitus (GDM) during pregnancy, antepartum Reports well controlled sugars IOL for LGA at 39 per her request. Was offered early IOL  4. Excessive fetal growth affecting management of pregnancy in third trimester, single or unspecified fetus  5. Maternal morbid obesity, antepartum (HCC) TWG=35 lb (15.9 kg)   Term labor symptoms and general obstetric precautions including but not limited to vaginal bleeding, contractions, leaking of fluid and fetal movement were reviewed in detail with the patient. Please refer to After Visit Summary for other counseling recommendations.   IOL scheduled Thursday   Federico Flake, MD

## 2023-12-13 NOTE — Progress Notes (Signed)
 2035 Notified by [facility] that baby's blood glucose is 39. Offered a bottle but mom does not want to give formula. She states that she has pre-expressed colostrum and as she is now in the chair ready to go upstairs she wants to wait until she is on the floor to give it to baby. NAN notified

## 2023-12-13 NOTE — Progress Notes (Addendum)
 ANTIBIOTIC CONSULT NOTE - INITIAL  Pharmacy Consult for Gentamicin Indication: Chorioamnionitis   Allergies  Allergen Reactions   Latex Itching and Rash    Patient Measurements:   Adjusted Body Weight: 99 kg  Vital Signs: Temp: 99 F (37.2 C) (03/21 0230) Temp Source: Axillary (03/21 0230) BP: 120/47 (03/21 0240) Pulse Rate: 112 (03/21 0240)  Labs: Recent Labs    12/11/23 0723 12/11/23 0752 12/12/23 1229  WBC 13.8*  --  12.2*  HGB 11.0*  --  10.2*  PLT 244  --  206  LABCREA  --  234  --   CREATININE 0.81  --  0.78   No results for input(s): "GENTTROUGH", "GENTPEAK", "GENTRANDOM" in the last 72 hours.   Microbiology: Recent Results (from the past 720 hours)  Strep Gp B NAA     Status: None   Collection Time: 11/21/23  3:20 PM   Specimen: Vaginal/Rectal; Genital   VR  Result Value Ref Range Status   Strep Gp B NAA Negative Negative Final    Comment: Centers for Disease Control and Prevention (CDC) and American Congress of Obstetricians and Gynecologists (ACOG) guidelines for prevention of perinatal group B streptococcal (GBS) disease specify co-collection of a vaginal and rectal swab specimen to maximize sensitivity of GBS detection. Per the CDC and ACOG, swabbing both the lower vagina and rectum substantially increases the yield of detection compared with sampling the vagina alone. Penicillin G, ampicillin, or cefazolin are indicated for intrapartum prophylaxis of perinatal GBS colonization. Reflex susceptibility testing should be performed prior to use of clindamycin only on GBS isolates from penicillin-allergic women who are considered a high risk for anaphylaxis. Treatment with vancomycin without additional testing is warranted if resistance to clindamycin is noted.     Medications:  Ampicillin 2g Q6 hours>  Assessment: 38 y.o. female [id number] at [redacted]w[redacted]d admitted for SROM and labor. Patient now with maternal fever and presumed chorioamnionitis.   Plan:    Gentamicin 5mg /kg IV every 24 hrs  Check Scr with next labs if gentamicin continued. Will check gentamicin levels if continued > 72hr or clinically indicated.  Jane Jackson 12/13/2023,4:54 AM

## 2023-12-13 NOTE — Progress Notes (Signed)
 Patient ID: Jane Jackson, female   DOB: 15-Oct-1985, 38 y.o.   MRN: [id number]  BP 139/83   Pulse 93   Temp 98.3 F (36.8 C) (Oral)   Resp 18   LMP 04/08/2023   SpO2 99%   Dilation: 10 Dilation Complete Date: 12/13/23 Dilation Complete Time: 1335 Effacement (%): 70 Station: Plus 1 Presentation: Vertex Exam by:: c Ante Arredondo,snm  Pushed with patient for 1hr. Good maternal effort but contractions spaced apart to every 3-5 minutes. Discussed increasing pitocin with patient and her husband to get contractions closer together. They agree with this plan. Cat 1 FHT.

## 2023-12-13 NOTE — Progress Notes (Signed)
 BLOOD GLUCOSE 106

## 2023-12-13 NOTE — Progress Notes (Addendum)
 Patient Vitals for the past 4 hrs:  BP Temp Temp src Pulse SpO2  12/12/23 2330 (!) 147/66 -- -- 77 --  12/12/23 2305 124/68 -- -- 91 --  12/12/23 2300 (!) 127/106 -- -- 93 --  12/12/23 2230 (!) 140/69 98.5 F (36.9 C) Oral 84 98 %  12/12/23 2200 130/63 -- -- 75 99 %  12/12/23 2130 (!) 113/56 -- -- 72 97 %  12/12/23 2100 134/67 -- -- 69 --  12/12/23 2040 134/61 -- -- 84 --  12/12/23 2020 111/60 98 F (36.7 C) Oral 81 --   Feeling some pressure w/ctx. Wants to take a nap. FHR 140s, moderate variability, + accels, no decels.  Ctx q 2-3 minutes.  8/100/-1 per RN exam. Pt declining labetalol. Expect delivery soon

## 2023-12-13 NOTE — Progress Notes (Signed)
 Reassessed pain after an hour. Patient states the she is still having pain. Attempted to give another percocet as original order was for 1-2, however, the order had been discontinued. Contacted Dr Lucianne Muss who did order oxycodone 5mg  but we had to wait for the medication. Med was given 2140 Just before patient was transported up to [facility].

## 2023-12-13 NOTE — Discharge Summary (Signed)
 Postpartum Discharge Summary      Patient Name: Jane Jackson DOB: February 26, 1986 MRN: [id number]  Date of admission: 12/11/2023 Delivery date:12/13/2023 Delivering provider: Elige Ko Date of discharge: 12/18/2023  Admitting diagnosis: Chronic hypertension during pregnancy, antepartum [O10.919] Intrauterine pregnancy: [redacted]w[redacted]d     Secondary diagnosis:  Principal Problem:   Chronic hypertension during pregnancy, antepartum Active Problems:   BMI 50.0-59.9, adult (HCC)   Gestational diabetes mellitus, antepartum   Excessive fetal growth affecting management of mother in third trimester, antepartum   PSVT (paroxysmal supraventricular tachycardia) (HCC)   AMA (advanced maternal age) multigravida 35+, third trimester  Additional problems:     Discharge diagnosis: Term Pregnancy Delivered, CHTN, and GDM A2   Super imposed pre eclampsia Postpartum endometritis                                            Post partum procedures:ECHO Augmentation: Pitocin and Cytotec Complications: Intrauterine Inflammation or infection (Chorioamniotis) and ROM>24 hours  Hospital course: Onset of Labor With Vaginal Delivery      38 y.o. yo [id number] at [redacted]w[redacted]d was admitted in Active Labor on 12/11/2023. Labor course was complicated by Triple I, prolonged ROM.  Membrane Rupture Time/Date: 3:50 AM,12/11/2023  Delivery Method:Vaginal, Spontaneous Operative Delivery:N/A Episiotomy: None Lacerations:  1st degree Patient had a postpartum course complicated by ^LFT and required magnesium for superimposed severe pre eclampsia.  She is ambulating, tolerating a regular diet, passing flatus, and urinating well. Patient is discharged home in stable condition on 12/18/23.  Newborn Data: Birth date:12/13/2023 Birth time:6:02 PM Gender:Female Living status:Living Apgars:6 ,8  Weight:3629 g  Magnesium Sulfate received: No BMZ received: No Rhophylac:N/A MMR:N/A T-DaP:Given prenatally Flu: No RSV Vaccine  received: No Transfusion:No  Immunizations received: Immunization History  Administered Date(s) Administered   Tdap 01/23/2017   Physical exam  Vitals:   12/17/23 2345 12/18/23 0629 12/18/23 0815 12/18/23 0820  BP: (!) 151/73 122/69  (!) 149/64  Pulse: 81 74  75  Resp: 18 17 17    Temp: 97.6 F (36.4 C) 98.4 F (36.9 C) 97.9 F (36.6 C)   TempSrc: Oral Tympanic Oral   SpO2: 96% 98% 96%    General: alert, cooperative, and no distress Lochia: appropriate Uterine Fundus: firm Incision: N/A DVT Evaluation: No evidence of DVT seen on physical exam. Labs: Lab Results  Component Value Date   WBC 11.3 (H) 12/17/2023   HGB 10.3 (L) 12/17/2023   HCT 30.7 (L) 12/17/2023   MCV 88.0 12/17/2023   PLT 280 12/17/2023      Latest Ref Rng & Units 12/17/2023    5:53 AM  CMP  Glucose 70 - 99 mg/dL 130   BUN 6 - 20 mg/dL 15   Creatinine 8.65 - 1.00 mg/dL 7.84   Sodium 696 - 295 mmol/L 134   Potassium 3.5 - 5.1 mmol/L 3.9   Chloride 98 - 111 mmol/L 95   CO2 22 - 32 mmol/L 28   Calcium 8.9 - 10.3 mg/dL 8.2   Total Protein 6.5 - 8.1 g/dL 5.5   Total Bilirubin 0.0 - 1.2 mg/dL 0.3   Alkaline Phos 38 - 126 U/L 136   AST 15 - 41 U/L 52   ALT 0 - 44 U/L 53    Edinburgh Score:    12/15/2023    2:55 PM  Edinburgh Postnatal Depression Scale Screening Tool  I have been able to laugh and see the funny side of things. 0  I have looked forward with enjoyment to things. 0  I have blamed myself unnecessarily when things went wrong. 2  I have been anxious or worried for no good reason. 2  I have felt scared or panicky for no good reason. 0  Things have been getting on top of me. 2  I have been so unhappy that I have had difficulty sleeping. 2  I have felt sad or miserable. 1  I have been so unhappy that I have been crying. 1  The thought of harming myself has occurred to me. 0  Edinburgh Postnatal Depression Scale Total 10   No data recorded  After visit meds:  Allergies as of  12/18/2023       Reactions   Latex Itching, Rash   Procardia [nifedipine] Other (See Comments)   Headache        Medication List     STOP taking these medications    aspirin EC 81 MG tablet   Dexcom G6 Sensor Misc   Dexcom G6 Transmitter Misc   insulin glargine 100 UNIT/ML Solostar Pen Commonly known as: LANTUS   ondansetron 4 MG disintegrating tablet Commonly known as: ZOFRAN-ODT   Pen Needles 33G X 4 MM Misc       TAKE these medications    albuterol 108 (90 Base) MCG/ACT inhaler Commonly known as: VENTOLIN HFA Inhale 1 puff into the lungs every 6 (six) hours as needed for wheezing or shortness of breath.   amoxicillin-clavulanate 875-125 MG tablet Commonly known as: AUGMENTIN Take 1 tablet by mouth every 12 (twelve) hours.   furosemide 20 MG tablet Commonly known as: LASIX Take 1 tablet (20 mg total) by mouth 2 (two) times daily. What changed:  medication strength how much to take when to take this   ibuprofen 600 MG tablet Commonly known as: ADVIL Take 1 tablet (600 mg total) by mouth every 6 (six) hours.   labetalol 200 MG tablet Commonly known as: NORMODYNE Take 2 tablets (400 mg total) by mouth every 8 (eight) hours. What changed:  medication strength how much to take when to take this   oxyCODONE 5 MG immediate release tablet Commonly known as: Oxy IR/ROXICODONE Take 1-2 tablets (5-10 mg total) by mouth every 4 (four) hours as needed for severe pain (pain score 7-10) or breakthrough pain.   potassium chloride SA 20 MEQ tablet Commonly known as: KLOR-CON M Take 2 tablets (40 mEq total) by mouth daily. What changed: how much to take   prenatal multivitamin Tabs tablet Take 1 tablet by mouth daily at 12 noon.      Discharge home in stable condition Infant Feeding: Breast Infant Disposition:home with mother Discharge instruction: per After Visit Summary and Postpartum booklet. Activity: Advance as tolerated. Pelvic rest for 6 weeks.   Diet: routine diet Future Appointments: Future Appointments  Date Time Provider Department Center  12/20/2023 10:45 AM [facility] NURSE [facility]  01/24/2024  8:30 AM [facility] LAB [facility]  01/24/2024  8:55 AM Federico Flake, MD [facility]    Follow up Visit:  Follow-up Information     [facility] Follow up on 12/27/2023.   Specialty: Obstetrics and Gynecology Why: BP check Contact information: [address] [place] [address] [phone]               Please schedule this patient  for a In person postpartum visit in 4 weeks with the following provider: MD. Additional Postpartum F/U:2 hour GTT and BP check 1 week  High risk pregnancy complicated by: GDM and HTN Delivery mode:  Vaginal, Spontaneous Anticipated Birth Control:  Condoms and vasectomy  12/18/2023 Lazaro Arms, MD

## 2023-12-14 LAB — CBC
HCT: 31.3 % — ABNORMAL LOW (ref 36.0–46.0)
Hemoglobin: 10.1 g/dL — ABNORMAL LOW (ref 12.0–15.0)
MCH: 28.7 pg (ref 26.0–34.0)
MCHC: 32.3 g/dL (ref 30.0–36.0)
MCV: 88.9 fL (ref 80.0–100.0)
Platelets: 232 10*3/uL (ref 150–400)
RBC: 3.52 MIL/uL — ABNORMAL LOW (ref 3.87–5.11)
RDW: 14.4 % (ref 11.5–15.5)
WBC: 17.7 10*3/uL — ABNORMAL HIGH (ref 4.0–10.5)
nRBC: 0 % (ref 0.0–0.2)

## 2023-12-14 MED ORDER — CALCIUM CARBONATE ANTACID 500 MG PO CHEW
1.0000 | CHEWABLE_TABLET | ORAL | Status: DC | PRN
  Administered 2023-12-14: 400 mg via ORAL
  Filled 2023-12-14: qty 2

## 2023-12-14 MED ORDER — FUROSEMIDE 20 MG PO TABS
20.0000 mg | ORAL_TABLET | Freq: Two times a day (BID) | ORAL | Status: DC
  Administered 2023-12-14 – 2023-12-15 (×2): 20 mg via ORAL
  Filled 2023-12-14 (×2): qty 1

## 2023-12-14 MED ORDER — OXYCODONE HCL 5 MG PO TABS
5.0000 mg | ORAL_TABLET | ORAL | Status: DC | PRN
  Administered 2023-12-14 – 2023-12-15 (×7): 10 mg via ORAL
  Administered 2023-12-16: 5 mg via ORAL
  Administered 2023-12-16: 10 mg via ORAL
  Administered 2023-12-16 – 2023-12-17 (×2): 5 mg via ORAL
  Administered 2023-12-17 – 2023-12-18 (×4): 10 mg via ORAL
  Filled 2023-12-14: qty 2
  Filled 2023-12-14: qty 1
  Filled 2023-12-14 (×2): qty 2
  Filled 2023-12-14: qty 1
  Filled 2023-12-14 (×10): qty 2

## 2023-12-14 MED ORDER — NIFEDIPINE ER OSMOTIC RELEASE 30 MG PO TB24
30.0000 mg | ORAL_TABLET | Freq: Every day | ORAL | Status: DC
  Administered 2023-12-14: 30 mg via ORAL
  Filled 2023-12-14: qty 1

## 2023-12-14 MED ORDER — NIFEDIPINE ER OSMOTIC RELEASE 30 MG PO TB24: 30.0000 mg | ORAL_TABLET | Freq: Two times a day (BID) | ORAL | Status: DC

## 2023-12-14 NOTE — Anesthesia Postprocedure Evaluation (Signed)
 Anesthesia Post Note  Patient: Jane Jackson  Procedure(s) Performed: AN AD HOC LABOR EPIDURAL     Patient location during evaluation: Mother Baby Anesthesia Type: Epidural Level of consciousness: awake Pain management: satisfactory to patient Vital Signs Assessment: post-procedure vital signs reviewed and stable Respiratory status: spontaneous breathing Cardiovascular status: stable Anesthetic complications: no  No notable events documented.  Last Vitals:  Vitals:   12/13/23 2308 12/14/23 0452  BP: (!) 151/85 130/64  Pulse: 93 79  Resp: 18 18  Temp:  36.6 C  SpO2: 98% 98%    Last Pain:  Vitals:   12/14/23 0755  TempSrc:   PainSc: 6    Pain Goal:                   Cephus Shelling

## 2023-12-14 NOTE — Progress Notes (Signed)
 Post Partum Day 1 s/p SVD Subjective: up ad lib, voiding, tolerating PO, + flatus, and notes still some numbness of right leg. She has informed Anesthesia team who has already rounded. She is able to ambulate to the bathroom.   Objective: Blood pressure 115/61, pulse 84, temperature 97.8 F (36.6 C), temperature source Oral, resp. rate 18, last menstrual period 04/08/2023, SpO2 98%, unknown if currently breastfeeding.  Physical Exam:  General: alert, cooperative, and no distress Lochia: appropriate Uterine Fundus: not palpated Incision: NA DVT Evaluation: Calf/Ankle edema is present bilaterally  Recent Labs    12/13/23 1930 12/14/23 0305  HGB 11.0* 10.1*  HCT 33.9* 31.3*    Assessment/Plan: Postpartum - Patient with significant cramping after delivery. Adjusted oxycodone to be 5-10 mg and may take Q4 hours.  - Contraception: Condoms until vasectomy - MOF: Breast - Rh status: Positive - Rubella status: Immune - Dispo: Likely PPD2 pending Bps.  - Consults: Lactation  Chorio - Chorio while in labor. Was started on amp/gent. Placed end time for 24 hours after delivery.   Chronic Hypertension - Was not on medication prior to delivery. Due to LE edema that is pitting, will increase lasix to 20 BID. She is on potassium (was 4.0 on last check). Will check CMP tomorrow.  - She had mild range pressures last night after delivery but then normal since 0400. She declined her labetalol so these are her pressures on just lasix. We will check vitals Q4h while awake for greater sample size.   GDMA2/B - Lantus 8 units daily - Check CBGs fasting and 2 hr PP while in patient to adjust insulin if needed. Overall control is sufficient.   Neonatal - Doing well   LOS: 3 days   Milas Hock 12/14/2023, 12:01 PM

## 2023-12-14 NOTE — Progress Notes (Signed)
 BS 121 taken at 2100. Unable to flow over from glucometer.

## 2023-12-15 ENCOUNTER — Other Ambulatory Visit: Payer: Self-pay

## 2023-12-15 ENCOUNTER — Inpatient Hospital Stay (HOSPITAL_COMMUNITY)

## 2023-12-15 LAB — CBC WITH DIFFERENTIAL/PLATELET
Abs Immature Granulocytes: 0.21 10*3/uL — ABNORMAL HIGH (ref 0.00–0.07)
Basophils Absolute: 0.1 10*3/uL (ref 0.0–0.1)
Basophils Relative: 1 %
Eosinophils Absolute: 0.3 10*3/uL (ref 0.0–0.5)
Eosinophils Relative: 2 %
HCT: 34.2 % — ABNORMAL LOW (ref 36.0–46.0)
Hemoglobin: 11 g/dL — ABNORMAL LOW (ref 12.0–15.0)
Immature Granulocytes: 2 %
Lymphocytes Relative: 16 %
Lymphs Abs: 2.1 10*3/uL (ref 0.7–4.0)
MCH: 28.9 pg (ref 26.0–34.0)
MCHC: 32.2 g/dL (ref 30.0–36.0)
MCV: 89.8 fL (ref 80.0–100.0)
Monocytes Absolute: 0.9 10*3/uL (ref 0.1–1.0)
Monocytes Relative: 7 %
Neutro Abs: 9.9 10*3/uL — ABNORMAL HIGH (ref 1.7–7.7)
Neutrophils Relative %: 72 %
Platelets: 252 10*3/uL (ref 150–400)
RBC: 3.81 MIL/uL — ABNORMAL LOW (ref 3.87–5.11)
RDW: 14.4 % (ref 11.5–15.5)
WBC: 13.5 10*3/uL — ABNORMAL HIGH (ref 4.0–10.5)
nRBC: 0 % (ref 0.0–0.2)

## 2023-12-15 LAB — CBC
HCT: 31.8 % — ABNORMAL LOW (ref 36.0–46.0)
Hemoglobin: 10.2 g/dL — ABNORMAL LOW (ref 12.0–15.0)
MCH: 28.3 pg (ref 26.0–34.0)
MCHC: 32.1 g/dL (ref 30.0–36.0)
MCV: 88.3 fL (ref 80.0–100.0)
Platelets: 238 10*3/uL (ref 150–400)
RBC: 3.6 MIL/uL — ABNORMAL LOW (ref 3.87–5.11)
RDW: 14.3 % (ref 11.5–15.5)
WBC: 11.4 10*3/uL — ABNORMAL HIGH (ref 4.0–10.5)
nRBC: 0 % (ref 0.0–0.2)

## 2023-12-15 LAB — COMPREHENSIVE METABOLIC PANEL
ALT: 33 U/L (ref 0–44)
ALT: 45 U/L — ABNORMAL HIGH (ref 0–44)
AST: 45 U/L — ABNORMAL HIGH (ref 15–41)
AST: 70 U/L — ABNORMAL HIGH (ref 15–41)
Albumin: 2.5 g/dL — ABNORMAL LOW (ref 3.5–5.0)
Albumin: 2.5 g/dL — ABNORMAL LOW (ref 3.5–5.0)
Alkaline Phosphatase: 102 U/L (ref 38–126)
Alkaline Phosphatase: 169 U/L — ABNORMAL HIGH (ref 38–126)
Anion gap: 7 (ref 5–15)
Anion gap: 10 (ref 5–15)
BUN: 16 mg/dL (ref 6–20)
BUN: 14 mg/dL (ref 6–20)
CO2: 23 mmol/L (ref 22–32)
CO2: 23 mmol/L (ref 22–32)
Calcium: 9.3 mg/dL (ref 8.9–10.3)
Calcium: 9.6 mg/dL (ref 8.9–10.3)
Chloride: 106 mmol/L (ref 98–111)
Chloride: 101 mmol/L (ref 98–111)
Creatinine, Ser: 0.97 mg/dL (ref 0.44–1.00)
Creatinine, Ser: 0.84 mg/dL (ref 0.44–1.00)
GFR, Estimated: >60 mL/min (ref >60)
GFR, Estimated: >60 mL/min (ref >60)
Glucose, Bld: 116 mg/dL — ABNORMAL HIGH (ref 70–99)
Glucose, Bld: 110 mg/dL — ABNORMAL HIGH (ref 70–99)
Potassium: 5 mmol/L (ref 3.5–5.1)
Potassium: 4.2 mmol/L (ref 3.5–5.1)
Sodium: 136 mmol/L (ref 135–145)
Sodium: 134 mmol/L — ABNORMAL LOW (ref 135–145)
Total Bilirubin: 0.3 mg/dL (ref 0.0–1.2)
Total Bilirubin: 0.6 mg/dL (ref 0.0–1.2)
Total Protein: 5.8 g/dL — ABNORMAL LOW (ref 6.5–8.1)
Total Protein: 5.7 g/dL — ABNORMAL LOW (ref 6.5–8.1)

## 2023-12-15 LAB — TROPONIN I (HIGH SENSITIVITY): Troponin I (High Sensitivity): 10 ng/L (ref <18)

## 2023-12-15 LAB — GLUCOSE, CAPILLARY
Glucose-Capillary: 102 mg/dL — ABNORMAL HIGH (ref 70–99)
Glucose-Capillary: 95 mg/dL (ref 70–99)

## 2023-12-15 LAB — BRAIN NATRIURETIC PEPTIDE: B Natriuretic Peptide: 31.2 pg/mL (ref 0.0–100.0)

## 2023-12-15 MED ORDER — CLINDAMYCIN PHOSPHATE 900 MG/50ML IV SOLN
900.0000 mg | Freq: Three times a day (TID) | INTRAVENOUS | Status: DC
  Administered 2023-12-15 – 2023-12-16 (×3): 900 mg via INTRAVENOUS
  Filled 2023-12-15 (×4): qty 50

## 2023-12-15 MED ORDER — FAMOTIDINE IN NACL 20-0.9 MG/50ML-% IV SOLN
20.0000 mg | Freq: Two times a day (BID) | INTRAVENOUS | Status: DC
  Administered 2023-12-15 – 2023-12-17 (×4): 20 mg via INTRAVENOUS
  Filled 2023-12-15 (×5): qty 50

## 2023-12-15 MED ORDER — FUROSEMIDE 10 MG/ML IJ SOLN
40.0000 mg | Freq: Once | INTRAMUSCULAR | Status: AC
  Administered 2023-12-15: 40 mg via INTRAVENOUS
  Filled 2023-12-15: qty 4

## 2023-12-15 MED ORDER — NIFEDIPINE ER OSMOTIC RELEASE 30 MG PO TB24
60.0000 mg | ORAL_TABLET | Freq: Every day | ORAL | Status: DC
Start: 2023-12-15 — End: 2023-12-15
  Administered 2023-12-15: 60 mg via ORAL
  Filled 2023-12-15: qty 2

## 2023-12-15 MED ORDER — FUROSEMIDE 10 MG/ML IJ SOLN
20.0000 mg | Freq: Two times a day (BID) | INTRAMUSCULAR | Status: DC
  Administered 2023-12-15: 20 mg via INTRAVENOUS
  Filled 2023-12-15: qty 4

## 2023-12-15 MED ORDER — ASPIRIN 325 MG PO TABS
325.0000 mg | ORAL_TABLET | Freq: Once | ORAL | Status: AC
  Administered 2023-12-15: 325 mg via ORAL
  Filled 2023-12-15 (×2): qty 1

## 2023-12-15 MED ORDER — LABETALOL HCL 200 MG PO TABS
300.0000 mg | ORAL_TABLET | Freq: Two times a day (BID) | ORAL | Status: DC
  Administered 2023-12-16 – 2023-12-17 (×3): 300 mg via ORAL
  Filled 2023-12-15 (×3): qty 1

## 2023-12-15 MED ORDER — IOHEXOL 350 MG/ML SOLN
75.0000 mL | Freq: Once | INTRAVENOUS | Status: AC | PRN
  Administered 2023-12-15: 75 mL via INTRAVENOUS

## 2023-12-15 MED ORDER — FUROSEMIDE 20 MG PO TABS: 20.0000 mg | ORAL_TABLET | Freq: Two times a day (BID) | ORAL | Status: DC

## 2023-12-15 MED ORDER — GENTAMICIN SULFATE 40 MG/ML IJ SOLN
5.0000 mg/kg | INTRAVENOUS | Status: DC
  Administered 2023-12-16: 500 mg via INTRAVENOUS
  Filled 2023-12-15 (×2): qty 12.5

## 2023-12-15 MED ORDER — ENOXAPARIN SODIUM 80 MG/0.8ML IJ SOSY
80.0000 mg | PREFILLED_SYRINGE | INTRAMUSCULAR | Status: DC
  Administered 2023-12-16 – 2023-12-17 (×3): 80 mg via SUBCUTANEOUS
  Filled 2023-12-15 (×3): qty 0.8

## 2023-12-15 MED ORDER — PIPERACILLIN-TAZOBACTAM 3.375 G IVPB
3.3750 g | Freq: Three times a day (TID) | INTRAVENOUS | Status: DC
  Administered 2023-12-15: 3.375 g via INTRAVENOUS
  Filled 2023-12-15 (×3): qty 50

## 2023-12-15 NOTE — Progress Notes (Signed)
 Checked patient 2 hr post prandial with hospital glucometer and it was 95, compared with patient's dexcom, which was 92. Per Dr. Lucianne Muss, ok to now check with dexcom for postprandial BS checks.   Cindi Carbon, RN

## 2023-12-15 NOTE — Progress Notes (Signed)
 Notified Dr. Earlene Plater that lab results were back. Dr. Earlene Plater to look over labs and discuss with Dr. Para March.

## 2023-12-15 NOTE — Progress Notes (Signed)
 CSW acknowledges consult placed due to Edinburgh score of 10. CSW attempted to meet with patient; however, patient stated she needed to use the restroom and requested CSW return at a later time. CSW made a second attempt. RN was at bedside assisting patient. Weekday CSW to meet with patient prior to discharge tomorrow, 2024-03-17.   Signed,  Norberto Sorenson, MSW, [place], LCASA 12-Oct-2023 6:09 PM

## 2023-12-15 NOTE — Progress Notes (Signed)
 Received secure chat conversation with Dr. Lucianne Muss regarding plan of care for pt (stict, I&O, echo, lasix, lovenox). RN updated MD on pt's concern about possibly a reaction to zosyn as pt states she had chest heaviness, rapid sudden onset of feeling dizzy, nauseated, and feeling like her skin felt hot. She feels like a lot of it has subsided since time has went by since the dose she had earlier. Dr. Lucianne Muss changed antibiotic regimen. Pt informed and updated at bedside.

## 2023-12-15 NOTE — Progress Notes (Signed)
 Pt c/o heartburn and requesting tums. Dr. Lucianne Muss called and order received.

## 2023-12-15 NOTE — Progress Notes (Addendum)
 Called to evaluate patient.   New onset crushing chest pain, sensation of shortness of breath, and dizziness. Felt like how she would feel if blood sugar was low but CBG checked and 113. Stomach feels bloated. She feels suddenly fatigued. No new medications. Was eating when symptoms started. + flatus. Small amount of bright red bleeding today.   Vitals are 156/81. HR 90 (but as high as 105). O2 sats 100%.   PE:  Gen: She is able to talk normal but appears uncomfortable. She is oriented CV: RRR, no r/m/g Pulm: CTAB, no crackles or rhonchi, normal breath sounds GI: + bs, mildly tender to palpation GU: Deferred LE: bilateral pitting edema  I rechecked blood pressure - patient reports they have been taking on her lower arm. 151/65.  Moved cuff to upper arm and popped off.   Plan: Check troponin EKG normal.  CMP, CBC, BNP and CMP.  Will give aspirin 325 mg.  Pending the labs, we will check CTA Will also have patient evaluated by RR nurse.   Milas Hock, MD Attending Obstetrician & Gynecologist, [facility] for [facility], [facility]

## 2023-12-15 NOTE — Significant Event (Addendum)
 Rapid Response Event Note   Reason for Call :  2nd set of eyes for pt having chest pressure  Pt has been getting procardia/lasix for htn/LE edema. She has also been on zosyn to treat prophylatically for endometritis.   Initial Focused Assessment:  Pt sitting on side of bed with eyes open. Her breathing is mildly labored. She is alert and oriented, c/o 4/10 mid sternal chest pressure and some mild SOB. She denies dizziness but did have it earlier. She says it feels as if she is "breathing through saran wrap." Lungs are clear t/o. ABD large/distended/soft/tender. Skin warm and dry. BLE pitting edema present.  T-98, HR-104, BP-152/78, RR-22, SpO2-100% on RA  Interventions:  EKG-ST-no STE  CMP CBC BNP Trop ASA 325mg  Pepcid 20mg  IV Plan of Care:  Give ASA/pepcid. Await lab results. If chest pressure/SOB persists and labs are unrevealing, pt may benefit from CTA chest to r/o PE. Please call RRT if further assistance needed.   Event Summary:   MD Notified: Dr. Para March at bedside on RRT arrival Call Time:1843 Arrival Time:1855 End Time:1930  Terrilyn Saver, RN

## 2023-12-15 NOTE — Progress Notes (Signed)
 Pharmacy Antibiotic Note  Jane Jackson is a 38 y.o. female admitted on 12/11/2023 with  postpartum endometritis .  Pharmacy has been consulted for gentamicin dosing.  Plan: Gentamicin 500 mg IV q24h, plan to check levels if patient remains on for greater than 48 hours or change in kidney function Clindamycin 900 mg IV q8h    Temp (24hrs), Avg:98 F (36.7 C), Min:97.8 F (36.6 C), Max:98.3 F (36.8 C)  Recent Labs  Lab 12/11/23 0723 12/12/23 1229 12/13/23 1930 12/14/23 0305 12/15/23 0726 12/15/23 1841  WBC 13.8* 12.2* 20.7* 17.7* 13.5* 11.4*  CREATININE 0.81 0.78  --   --  0.97 0.84    Estimated Creatinine Clearance: 139.1 mL/min (by C-G formula based on SCr of 0.84 mg/dL).    Allergies  Allergen Reactions   Latex Itching and Rash   Procardia [Nifedipine] Other (See Comments)    Headache    Antimicrobials this admission: Ampicillin 3/21 >> 3/22 Gentamicin 3/21 >> 3/22 Zosyn 3/23 >> 3/23    Thank you for allowing pharmacy to be a part of this patient's care.  Don Broach 12/15/2023 9:59 PM

## 2023-12-15 NOTE — Progress Notes (Signed)
 Called to patient's room stating patient is "not feeling well" entering patient room and BP elevated, see flowsheet, patient stating she is having difficulty breathing and feels like a brick on chest. Called MD to room who came to bedside. Patient continuing to state she just doesn't feel right. Labs and EKG ordered at this time. Will continue to monitor.   Cindi Carbon, RN

## 2023-12-15 NOTE — Lactation Note (Signed)
 This note was copied from a baby's chart. Lactation Consultation Note  Patient Name: Jane Jackson [id number] Date: 12/15/2023 Age:40 hours  Reason for consult: Follow-up assessment;Early term 37-38.6wks  P3, [redacted]w[redacted]d, 5.5% weight loss  Experienced breastfeeding mother who reports she has been breastfeeding her second child, now 65 months old, 2-3 times per day. She has not breast fed him since her admission to the hospital and unsure if they will resume breastfeeding, pending his response. Mother advised to breast fed baby "Adeline" first and frequently.  Observed mother latch and feed her baby. Mother demonstrates independent although she has limitation due to a sensitive IV in her right upper arm. Father of baby at bedside and helpful. Baby latched with ease, audible swallows and active feeding at the breast. Mother states she feeds her ad lib. Mother had requested hydrogels for "potential sore nipples" and denies any soreness or nipple skin breakdown. Reviewed care and use of hydrogel pads if mother uses. Breast pads given at mother's request due to slight leaking of milk.   She denies any other concerns regarding breastfeeding. Questions answered regarding diet and tandem nursing. Lactation will follow at mother's request or as needed.   Mom made aware of O/P services, breastfeeding support groups, and our phone # for post-discharge questions.     Feeding Mother's Current Feeding Choice: Breast Milk  LATCH Score Latch: Grasps breast easily, tongue down, lips flanged, rhythmical sucking.  Audible Swallowing: Spontaneous and intermittent  Type of Nipple: Everted at rest and after stimulation  Comfort (Breast/Nipple): Soft / non-tender  Hold (Positioning): No assistance needed to correctly position infant at breast. (mother adjusted baby to improve nasal airway)  LATCH Score: 10   Interventions Interventions: Education  Discharge Discharge Education: Engorgement and breast  care;Warning signs for feeding baby Pump: Personal  Consult Status Consult Status: PRN    [name] 12/15/2023, 12:13 PM

## 2023-12-15 NOTE — Lactation Note (Signed)
 This note was copied from a baby's chart. Lactation Consultation Note  Patient Name: Jane Jackson [id number] Date: 12/15/2023 Age:40 hours  LC attempted to see mom. Mom stated she was in a lot of pain right now. LC will return later.   Maternal Data    Feeding    LATCH Score                    Lactation Tools Discussed/Used    Interventions    Discharge    Consult Status      Jakye Mullens, Diamond Nickel 12/15/2023, 1:40 AM

## 2023-12-15 NOTE — Progress Notes (Signed)
 Called Dr. Lucianne Muss to clarify CMP order for this AM and order modified for lab to see to draw. Reviewed BPs 164/98 at 0514 and 155/93 at recheck at 0530. Pt not complaining of symptoms except nausea (but eating sandwich and medicated with zofran.) Dr. Lucianne Muss to enter order to increase procardia.

## 2023-12-15 NOTE — Progress Notes (Signed)
 Pt aware of order for fasting blood glucose, states she needs to eat a snack as baby is cluster-feeding. Blood glucose 102.

## 2023-12-15 NOTE — Progress Notes (Signed)
 POSTPARTUM PROGRESS NOTE  Post Partum Day 1  Subjective:  Jane Jackson is a 38 y.o. [id number] s/p SVD at [redacted]w[redacted]d.  She reports she is doing well. No acute events overnight. She denies any problems with ambulating, voiding or po intake. Denies nausea or vomiting.  Pain is poorly controlled.  Lochia is moderate. Reports increasingly painful abdominal/fundal tenderness. She feels this is unusual and more than expected compared to prior postpartum courses. She also notes lower extremity swelling still present and has been uncomfortable for her. She denies HA, vision changes, CP, dyspnea on exertion.  Objective: Blood pressure (!) 155/93, pulse 79, temperature 98.3 F (36.8 C), temperature source Oral, resp. rate 20, last menstrual period 04/08/2023, SpO2 96%, unknown if currently breastfeeding.  Physical Exam:  General: alert, cooperative and no distress Chest: no respiratory distress Heart:regular rate, distal pulses intact Uterine Fundus: firm, extremely tender on light palpation of fundus DVT Evaluation: No calf swelling or tenderness Extremities: 2+ edema Skin: warm, dry  Recent Labs    12/14/23 0305 12/15/23 0726  HGB 10.1* 11.0*  HCT 31.3* 34.2*    Assessment/Plan: Jane Jackson is a 38 y.o. [id number] s/p SVD at [redacted]w[redacted]d   PPD#1 - Doing well  Routine postpartum care  Postpartum endometritis - CBC with diff ordered, start Zosyn  Chronic hypertension - BP elevated this AM -- had severe range x 1, though decreased to moderate range on recheck, though BP has been checked on forearm -- d/w nursing team to try checking with larger cuff on upper arm  Cr ok, IV lasix 20mg  x 2 doses today, re-eval for additional doses tomorrow  Increase Procardia to 60mg  daily  GDMA2/B Lantus 8 units daily Check CBGs fasting and 2 hr PP while in patient to adjust insulin if needed. Overall control is sufficient.   Contraception: Condoms/vasectomy Feeding: Breast Dispo: Plan for discharge  tomorrow.   LOS: 4 days   Sundra Aland, MD OB Fellow  12/15/2023, 8:54 AM

## 2023-12-16 ENCOUNTER — Encounter: Payer: Medicaid Other | Admitting: Obstetrics and Gynecology

## 2023-12-16 ENCOUNTER — Inpatient Hospital Stay (HOSPITAL_COMMUNITY)

## 2023-12-16 DIAGNOSIS — R0609 Other forms of dyspnea: Secondary | ICD-10-CM | POA: Diagnosis not present

## 2023-12-16 LAB — ECHOCARDIOGRAM COMPLETE
AR max vel: 3.98 cm2
AV Area VTI: 4.68 cm2
AV Area mean vel: 4.02 cm2
AV Mean grad: 3 mmHg
AV Peak grad: 5.2 mmHg
Ao pk vel: 1.14 m/s
Area-P 1/2: 3.77 cm2
S' Lateral: 3.1 cm

## 2023-12-16 LAB — COMPREHENSIVE METABOLIC PANEL
ALT: 63 U/L — ABNORMAL HIGH (ref 0–44)
AST: 92 U/L — ABNORMAL HIGH (ref 15–41)
Albumin: 2.6 g/dL — ABNORMAL LOW (ref 3.5–5.0)
Alkaline Phosphatase: 200 U/L — ABNORMAL HIGH (ref 38–126)
Anion gap: 12 (ref 5–15)
BUN: 13 mg/dL (ref 6–20)
CO2: 24 mmol/L (ref 22–32)
Calcium: 9.3 mg/dL (ref 8.9–10.3)
Chloride: 98 mmol/L (ref 98–111)
Creatinine, Ser: 0.86 mg/dL (ref 0.44–1.00)
GFR, Estimated: >60 mL/min (ref >60)
Glucose, Bld: 117 mg/dL — ABNORMAL HIGH (ref 70–99)
Potassium: 4.4 mmol/L (ref 3.5–5.1)
Sodium: 134 mmol/L — ABNORMAL LOW (ref 135–145)
Total Bilirubin: 0.5 mg/dL (ref 0.0–1.2)
Total Protein: 6.2 g/dL — ABNORMAL LOW (ref 6.5–8.1)

## 2023-12-16 LAB — CBC
HCT: 33.2 % — ABNORMAL LOW (ref 36.0–46.0)
Hemoglobin: 11 g/dL — ABNORMAL LOW (ref 12.0–15.0)
MCH: 28.6 pg (ref 26.0–34.0)
MCHC: 33.1 g/dL (ref 30.0–36.0)
MCV: 86.2 fL (ref 80.0–100.0)
Platelets: 286 10*3/uL (ref 150–400)
RBC: 3.85 MIL/uL — ABNORMAL LOW (ref 3.87–5.11)
RDW: 14.4 % (ref 11.5–15.5)
WBC: 12.1 10*3/uL — ABNORMAL HIGH (ref 4.0–10.5)
nRBC: 0 % (ref 0.0–0.2)

## 2023-12-16 LAB — GLUCOSE, CAPILLARY: Glucose-Capillary: 121 mg/dL — ABNORMAL HIGH (ref 70–99)

## 2023-12-16 MED ORDER — FUROSEMIDE 10 MG/ML IJ SOLN
40.0000 mg | Freq: Two times a day (BID) | INTRAMUSCULAR | Status: AC
  Administered 2023-12-16: 40 mg via INTRAVENOUS
  Filled 2023-12-16 (×2): qty 4

## 2023-12-16 MED ORDER — MAGNESIUM SULFATE 40 GM/1000ML IV SOLN
2.0000 g/h | INTRAVENOUS | Status: AC
  Administered 2023-12-16 – 2023-12-17 (×2): 2 g/h via INTRAVENOUS
  Filled 2023-12-16 (×2): qty 1000

## 2023-12-16 MED ORDER — MAGNESIUM SULFATE BOLUS VIA INFUSION
4.0000 g | Freq: Once | INTRAVENOUS | Status: AC
  Administered 2023-12-16: 4 g via INTRAVENOUS
  Filled 2023-12-16: qty 1000

## 2023-12-16 MED ORDER — LACTATED RINGERS IV SOLN: INTRAVENOUS | Status: AC

## 2023-12-16 MED ORDER — POTASSIUM CHLORIDE CRYS ER 20 MEQ PO TBCR
40.0000 meq | EXTENDED_RELEASE_TABLET | Freq: Every day | ORAL | Status: DC
  Administered 2023-12-16 – 2023-12-18 (×3): 40 meq via ORAL
  Filled 2023-12-16 (×3): qty 2

## 2023-12-16 MED ORDER — FUROSEMIDE 20 MG PO TABS
20.0000 mg | ORAL_TABLET | Freq: Two times a day (BID) | ORAL | Status: DC
  Administered 2023-12-17 – 2023-12-18 (×3): 20 mg via ORAL
  Filled 2023-12-16 (×3): qty 1

## 2023-12-16 NOTE — Progress Notes (Signed)
 CSW received consult due to score 10 on Edinburgh Depression Screen. CSW met MOB at bedside to complete a mental health assessment and offer support. CSW entered the room, introduced herself and acknowledged that FOB was present. MOB gave CSW verbal permission to speak about anything while FOB was present. CSW explained her role and the reason for the visit. MO presented bonding/feeding the infant and was polite, easy to engage, receptive to meeting with CSW, and appeared forthcoming.  CSW acknowledged [place] score of 10 and MOB reported a week prior being a "bad week" with preparing for the arrival of the new infant and currently experiencing minor health complications with healing. Patient declines a referral to [employer]. Patient verbalizes understanding that the appointment will be virtual.  CSW inquired about MOB's mental health history. MOB denied any/all mental health prior to pregnancy, during pregnancy and PPD. MOB reported "I'm not a sad person" and "most days I wake up happy" . CSW asked MOB how she is feeling currently. MOB reported she is tired and ready to reunite with her other children who are currently in the care of her family. CSW asked MOB about needing any mental health resources for this PP period ; MOB declined. MOB reported her supports as FOB and her mom. CSW provided education regarding Baby Blues vs PMADs and provided MOB with resources for mental health follow up.  CSW encouraged MOB to evaluate her mental health throughout the postpartum period with the use of the New Mom Checklist developed by Postpartum Progress as well as the [place] Postnatal Depression Scale and notify a medical professional if symptoms arise.    CSW asked MOB has she selected a pediatrician for the infant's follow up visits; MOB said [facility].  MOB reported having all essential items for the infant including a carseat, bassinet and crib for safe sleeping. CSW provided review of Sudden  Infant Death Syndrome (SIDS) precautions.  CSW identifies no further need for intervention and no barriers to discharge at this time.  Enos Fling, Theresia Majors Clinical Social Worker [phone]

## 2023-12-16 NOTE — Progress Notes (Signed)
 Post Partum Day 3 Subjective: Still has headache. Also complaining of abdominal pain - pain is diffuse, but mostly RUQ. SOB is improved.   Objective:  Patient Vitals for the past 24 hrs:  BP Temp Temp src Pulse Resp SpO2  12/16/23 0854 (!) 143/71 -- -- 85 -- --  12/16/23 0555 (!) 146/92 -- -- 91 -- --  12/16/23 0526 133/78 97.9 F (36.6 C) Oral 92 20 97 %  12/16/23 0224 (!) 152/92 -- -- 96 20 100 %  12/16/23 0200 (!) 141/62 98.3 F (36.8 C) Oral 87 20 99 %  12/15/23 2331 (!) 143/75 -- -- (!) 101 -- 100 %  12/15/23 2213 (!) 140/80 -- -- -- -- --  12/15/23 2155 (!) 164/78 -- -- 81 (!) 22 98 %  12/15/23 2002 -- 97.8 F (36.6 C) Oral -- -- --  12/15/23 1802 (!) 152/78 -- -- 99 -- 100 %  12/15/23 1733 -- 98 F (36.7 C) Axillary 99 18 99 %  12/15/23 1339 135/68 98.2 F (36.8 C) Oral 85 16 98 %   I/O last 3 completed shifts: In: 613 [P.O.:360; I.V.:40; IV Piggyback:213] Out: 4600 [Urine:4600]   Physical Exam:  General: alert, cooperative, and no distress Lochia: appropriate Abd: RUQ tenderness. No rebound. Mild guarding. Uterine Fundus: firm Incision: n/a DVT Evaluation: No evidence of DVT seen on physical exam. Calf/Ankle edema is present.  Results for orders placed or performed during the hospital encounter of 12/11/23 (from the past 24 hours)  Glucose, capillary     Status: None   Collection Time: 12/15/23  1:36 PM  Result Value Ref Range   Glucose-Capillary 95 70 - 99 mg/dL  Troponin I (High Sensitivity)     Status: None   Collection Time: 12/15/23  6:41 PM  Result Value Ref Range   Troponin I (High Sensitivity) 10 <18 ng/L  Brain natriuretic peptide     Status: None   Collection Time: 12/15/23  6:41 PM  Result Value Ref Range   B Natriuretic Peptide 31.2 0.0 - 100.0 pg/mL  Comprehensive metabolic panel     Status: Abnormal   Collection Time: 12/15/23  6:41 PM  Result Value Ref Range   Sodium 134 (L) 135 - 145 mmol/L   Potassium 4.2 3.5 - 5.1 mmol/L   Chloride  101 98 - 111 mmol/L   CO2 23 22 - 32 mmol/L   Glucose, Bld 110 (H) 70 - 99 mg/dL   BUN 14 6 - 20 mg/dL   Creatinine, Ser 1.61 0.44 - 1.00 mg/dL   Calcium 9.6 8.9 - 09.6 mg/dL   Total Protein 5.7 (L) 6.5 - 8.1 g/dL   Albumin 2.5 (L) 3.5 - 5.0 g/dL   AST 70 (H) 15 - 41 U/L   ALT 45 (H) 0 - 44 U/L   Alkaline Phosphatase 169 (H) 38 - 126 U/L   Total Bilirubin 0.6 0.0 - 1.2 mg/dL   GFR, Estimated >04 >54 mL/min   Anion gap 10 5 - 15  CBC     Status: Abnormal   Collection Time: 12/15/23  6:41 PM  Result Value Ref Range   WBC 11.4 (H) 4.0 - 10.5 K/uL   RBC 3.60 (L) 3.87 - 5.11 MIL/uL   Hemoglobin 10.2 (L) 12.0 - 15.0 g/dL   HCT 09.8 (L) 11.9 - 14.7 %   MCV 88.3 80.0 - 100.0 fL   MCH 28.3 26.0 - 34.0 pg   MCHC 32.1 30.0 - 36.0 g/dL   RDW 14.3  11.5 - 15.5 %   Platelets 238 150 - 400 K/uL   nRBC 0.0 0.0 - 0.2 %  CBC     Status: Abnormal   Collection Time: 12/16/23  5:53 AM  Result Value Ref Range   WBC 12.1 (H) 4.0 - 10.5 K/uL   RBC 3.85 (L) 3.87 - 5.11 MIL/uL   Hemoglobin 11.0 (L) 12.0 - 15.0 g/dL   HCT 09.8 (L) 11.9 - 14.7 %   MCV 86.2 80.0 - 100.0 fL   MCH 28.6 26.0 - 34.0 pg   MCHC 33.1 30.0 - 36.0 g/dL   RDW 82.9 56.2 - 13.0 %   Platelets 286 150 - 400 K/uL   nRBC 0.0 0.0 - 0.2 %  Comprehensive metabolic panel     Status: Abnormal   Collection Time: 12/16/23  5:53 AM  Result Value Ref Range   Sodium 134 (L) 135 - 145 mmol/L   Potassium 4.4 3.5 - 5.1 mmol/L   Chloride 98 98 - 111 mmol/L   CO2 24 22 - 32 mmol/L   Glucose, Bld 117 (H) 70 - 99 mg/dL   BUN 13 6 - 20 mg/dL   Creatinine, Ser 8.65 0.44 - 1.00 mg/dL   Calcium 9.3 8.9 - 78.4 mg/dL   Total Protein 6.2 (L) 6.5 - 8.1 g/dL   Albumin 2.6 (L) 3.5 - 5.0 g/dL   AST 92 (H) 15 - 41 U/L   ALT 63 (H) 0 - 44 U/L   Alkaline Phosphatase 200 (H) 38 - 126 U/L   Total Bilirubin 0.5 0.0 - 1.2 mg/dL   GFR, Estimated >69 >62 mL/min   Anion gap 12 5 - 15      Assessment/Plan: Echo today. With RUQ tenderness, HA, and LFTs  >2x normal - meets criteria for Preeclampsia with severe features. Start magnesium, transfer to [facility] specialty care.  Continue BP control.   LOS: 5 days   Levie Heritage, DO 12/16/2023, 10:18 AM

## 2023-12-16 NOTE — Progress Notes (Signed)
 2D Echocardiogram completed

## 2023-12-16 NOTE — Progress Notes (Addendum)
 Called to room by FOB. Pt sitting in recliner breastfeeding baby. C/o feeling like she's "drugged." Describes it as feeling groggy and like her head is heavy. Also c/o some SOB, like "breathing in a paper bag." VS checked. BP 152/92, HR 96, RR 20, O2 sat 100% on room air. Pt reports glucose on Dexcom 113. Call placed to Dr. Lucianne Muss at [phone]. Awaiting return call.   Addendum: Call back from Dr. Lucianne Muss at [phone]; reviewed above. Will get orthostatic VS on pt as she is voicing these complaints as positional. Pt updated and aware at 0328 but breastfeeding baby again in bed.

## 2023-12-16 NOTE — Plan of Care (Signed)
 Problem: Health Behavior/Discharge Planning: Goal: Ability to manage health-related needs will improve Outcome: Progressing   Problem: Clinical Measurements: Goal: Ability to maintain clinical measurements within normal limits will improve Outcome: Progressing Goal: Will remain free from infection Outcome: Progressing Goal: Diagnostic test results will improve Outcome: Progressing Goal: Respiratory complications will improve Outcome: Progressing Goal: Cardiovascular complication will be avoided Outcome: Progressing   Problem: Coping: Goal: Level of anxiety will decrease Outcome: Progressing   Problem: Elimination: Goal: Will not experience complications related to bowel motility Outcome: Progressing   Problem: Pain Managment: Goal: General experience of comfort will improve and/or be controlled Outcome: Progressing   Problem: Safety: Goal: Ability to remain free from injury will improve Outcome: Progressing   Problem: Skin Integrity: Goal: Risk for impaired skin integrity will decrease Outcome: Progressing   Problem: Education: Goal: Ability to describe self-care measures that may prevent or decrease complications (Diabetes Survival Skills Education) will improve Outcome: Progressing Goal: Individualized Educational Video(s) Outcome: Progressing   Problem: Coping: Goal: Ability to adjust to condition or change in health will improve Outcome: Progressing   Problem: Fluid Volume: Goal: Ability to maintain a balanced intake and output will improve Outcome: Progressing   Problem: Health Behavior/Discharge Planning: Goal: Ability to identify and utilize available resources and services will improve Outcome: Progressing Goal: Ability to manage health-related needs will improve Outcome: Progressing   Problem: Metabolic: Goal: Ability to maintain appropriate glucose levels will improve Outcome: Progressing   Problem: Nutritional: Goal: Maintenance of adequate  nutrition will improve Outcome: Progressing Goal: Progress toward achieving an optimal weight will improve Outcome: Progressing   Problem: Skin Integrity: Goal: Risk for impaired skin integrity will decrease Outcome: Progressing   Problem: Tissue Perfusion: Goal: Adequacy of tissue perfusion will improve Outcome: Progressing   Problem: Education: Goal: Knowledge of Childbirth will improve Outcome: Progressing Goal: Ability to make informed decisions regarding treatment and plan of care will improve Outcome: Progressing Goal: Ability to state and carry out methods to decrease the pain will improve Outcome: Progressing Goal: Individualized Educational Video(s) Outcome: Progressing   Problem: Coping: Goal: Ability to verbalize concerns and feelings about labor and delivery will improve Outcome: Progressing   Problem: Life Cycle: Goal: Ability to make normal progression through stages of labor will improve Outcome: Progressing Goal: Ability to effectively push during vaginal delivery will improve Outcome: Progressing   Problem: Role Relationship: Goal: Will demonstrate positive interactions with the child Outcome: Progressing   Problem: Safety: Goal: Risk of complications during labor and delivery will decrease Outcome: Progressing   Problem: Pain Management: Goal: Relief or control of pain from uterine contractions will improve Outcome: Progressing   Problem: Education: Goal: Knowledge of disease or condition will improve Outcome: Progressing Goal: Knowledge of the prescribed therapeutic regimen will improve Outcome: Progressing   Problem: Fluid Volume: Goal: Peripheral tissue perfusion will improve Outcome: Progressing   Problem: Clinical Measurements: Goal: Complications related to disease process, condition or treatment will be avoided or minimized Outcome: Progressing   Problem: Education: Goal: Knowledge of condition will improve Outcome:  Progressing Goal: Individualized Educational Video(s) Outcome: Progressing Goal: Individualized Newborn Educational Video(s) Outcome: Progressing   Problem: Activity: Goal: Will verbalize the importance of balancing activity with adequate rest periods Outcome: Progressing Goal: Ability to tolerate increased activity will improve Outcome: Progressing   Problem: Coping: Goal: Ability to identify and utilize available resources and services will improve Outcome: Progressing   Problem: Life Cycle: Goal: Chance of risk for complications during the postpartum period will decrease Outcome:  Progressing   Problem: Role Relationship: Goal: Ability to demonstrate positive interaction with newborn will improve Outcome: Progressing   Problem: Skin Integrity: Goal: Demonstration of wound healing without infection will improve Outcome: Progressing

## 2023-12-16 NOTE — Progress Notes (Signed)
 On the phone with Dr. Lucianne Muss regarding another pt and updated MD on this pt as well. Pt c/o feeling dizzy, shaky, and nauseated around 2331 when sitting up in bed to breastfeed baby. Glucose from pt's Dexcom 96, BP 143/75, HR 101. Pt up to bathroom without difficulty. C/o similar symptoms after returning to room from bathroom to sit in chair and breastfeed baby. Pt drank a juice. Reviewed medications given (antibiotics, pepcid, lasix, lovenox all as ordered) as well as urine output since started measuring at 2200 (2700 ml). No new orders at this time.

## 2023-12-17 LAB — COMPREHENSIVE METABOLIC PANEL
ALT: 53 U/L — ABNORMAL HIGH (ref 0–44)
AST: 52 U/L — ABNORMAL HIGH (ref 15–41)
Albumin: 2.4 g/dL — ABNORMAL LOW (ref 3.5–5.0)
Alkaline Phosphatase: 136 U/L — ABNORMAL HIGH (ref 38–126)
Anion gap: 11 (ref 5–15)
BUN: 15 mg/dL (ref 6–20)
CO2: 28 mmol/L (ref 22–32)
Calcium: 8.2 mg/dL — ABNORMAL LOW (ref 8.9–10.3)
Chloride: 95 mmol/L — ABNORMAL LOW (ref 98–111)
Creatinine, Ser: 1.05 mg/dL — ABNORMAL HIGH (ref 0.44–1.00)
GFR, Estimated: >60 mL/min (ref >60)
Glucose, Bld: 134 mg/dL — ABNORMAL HIGH (ref 70–99)
Potassium: 3.9 mmol/L (ref 3.5–5.1)
Sodium: 134 mmol/L — ABNORMAL LOW (ref 135–145)
Total Bilirubin: 0.3 mg/dL (ref 0.0–1.2)
Total Protein: 5.5 g/dL — ABNORMAL LOW (ref 6.5–8.1)

## 2023-12-17 LAB — GLUCOSE, CAPILLARY: Glucose-Capillary: 143 mg/dL — ABNORMAL HIGH (ref 70–99)

## 2023-12-17 LAB — CBC
HCT: 30.7 % — ABNORMAL LOW (ref 36.0–46.0)
Hemoglobin: 10.3 g/dL — ABNORMAL LOW (ref 12.0–15.0)
MCH: 29.5 pg (ref 26.0–34.0)
MCHC: 33.6 g/dL (ref 30.0–36.0)
MCV: 88 fL (ref 80.0–100.0)
Platelets: 280 10*3/uL (ref 150–400)
RBC: 3.49 MIL/uL — ABNORMAL LOW (ref 3.87–5.11)
RDW: 14.6 % (ref 11.5–15.5)
WBC: 11.3 10*3/uL — ABNORMAL HIGH (ref 4.0–10.5)
nRBC: 0 % (ref 0.0–0.2)

## 2023-12-17 MED ORDER — LABETALOL HCL 200 MG PO TABS
400.0000 mg | ORAL_TABLET | Freq: Three times a day (TID) | ORAL | Status: DC
  Administered 2023-12-17 – 2023-12-18 (×4): 400 mg via ORAL
  Filled 2023-12-17 (×4): qty 2

## 2023-12-17 MED ORDER — BISACODYL 10 MG RE SUPP: 10.0000 mg | Freq: Once | RECTAL | Status: DC

## 2023-12-17 MED ORDER — FAMOTIDINE 20 MG PO TABS
20.0000 mg | ORAL_TABLET | Freq: Two times a day (BID) | ORAL | Status: DC
  Filled 2023-12-17: qty 1

## 2023-12-17 MED ORDER — AMOXICILLIN-POT CLAVULANATE 875-125 MG PO TABS
1.0000 | ORAL_TABLET | Freq: Two times a day (BID) | ORAL | Status: DC
  Administered 2023-12-17 – 2023-12-18 (×2): 1 via ORAL
  Filled 2023-12-17 (×2): qty 1

## 2023-12-17 NOTE — Progress Notes (Signed)
The patient is receiving Famotidine by the intravenous route.  Based on criteria approved by the Pharmacy and Therapeutics Committee and the Medical Executive Committee, the medication is being converted to the equivalent oral dose form.  These criteria include: -No Active GI bleeding -Able to tolerate diet of full liquids (or better) or tube feeding -Able to tolerate other medications by the oral or enteral route  If you have any questions about this conversion, please contact the Pharmacy Department (ext [phone]).  Thank you.

## 2023-12-17 NOTE — Plan of Care (Signed)
  Problem: Health Behavior/Discharge Planning: Goal: Ability to manage health-related needs will improve Outcome: Progressing   Problem: Clinical Measurements: Goal: Ability to maintain clinical measurements within normal limits will improve Outcome: Progressing Goal: Will remain free from infection Outcome: Progressing Goal: Diagnostic test results will improve Outcome: Progressing Goal: Respiratory complications will improve Outcome: Progressing Goal: Cardiovascular complication will be avoided Outcome: Progressing   Problem: Coping: Goal: Level of anxiety will decrease Outcome: Progressing

## 2023-12-17 NOTE — Progress Notes (Signed)
 Post Partum Day 4 Subjective: no complaints and tolerating PO  Objective: Blood pressure (!) 155/74, pulse 89, temperature 97.7 F (36.5 C), temperature source Oral, resp. rate 18, last menstrual period 04/08/2023, SpO2 96%, unknown if currently breastfeeding.  Physical Exam:  General: alert, cooperative, and no distress Lochia: appropriate Uterine Fundus: firm Incision:  DVT Evaluation: No evidence of DVT seen on physical exam.     Latest Ref Rng & Units 12/17/2023    5:53 AM 12/16/2023    5:53 AM 12/15/2023    6:41 PM  CBC  WBC 4.0 - 10.5 K/uL 11.3  12.1  11.4   Hemoglobin 12.0 - 15.0 g/dL 16.1  09.6  04.5   Hematocrit 36.0 - 46.0 % 30.7  33.2  31.8   Platelets 150 - 400 K/uL 280  286  238        Latest Ref Rng & Units 12/17/2023    5:53 AM 12/16/2023    5:53 AM 12/15/2023    6:41 PM  CMP  Glucose 70 - 99 mg/dL 409  811  914   BUN 6 - 20 mg/dL 15  13  14    Creatinine 0.44 - 1.00 mg/dL 7.82  9.56  2.13   Sodium 135 - 145 mmol/L 134  134  134   Potassium 3.5 - 5.1 mmol/L 3.9  4.4  4.2   Chloride 98 - 111 mmol/L 95  98  101   CO2 22 - 32 mmol/L 28  24  23    Calcium 8.9 - 10.3 mg/dL 8.2  9.3  9.6   Total Protein 6.5 - 8.1 g/dL 5.5  6.2  5.7   Total Bilirubin 0.0 - 1.2 mg/dL 0.3  0.5  0.6   Alkaline Phos 38 - 126 U/L 136  200  169   AST 15 - 41 U/L 52  92  70   ALT 0 - 44 U/L 53  63  45      Assessment/Plan: Improving status Stop magnesium1230 ^labetalol 400 q8h Anticipate discharge tomorrow   LOS: 6 days   Lazaro Arms, MD 12/17/2023, 10:15 AM

## 2023-12-18 ENCOUNTER — Other Ambulatory Visit (HOSPITAL_COMMUNITY): Payer: Self-pay

## 2023-12-18 LAB — GLUCOSE, CAPILLARY
Glucose-Capillary: 117 mg/dL — ABNORMAL HIGH (ref 70–99)
Glucose-Capillary: 109 mg/dL — ABNORMAL HIGH (ref 70–99)

## 2023-12-18 MED ORDER — AMOXICILLIN-POT CLAVULANATE 875-125 MG PO TABS
1.0000 | ORAL_TABLET | Freq: Two times a day (BID) | ORAL | 0 refills | Status: DC
  Filled 2023-12-18: qty 18, 9d supply, fill #0

## 2023-12-18 MED ORDER — LABETALOL HCL 200 MG PO TABS
400.0000 mg | ORAL_TABLET | Freq: Three times a day (TID) | ORAL | 1 refills | Status: DC
  Filled 2023-12-18: qty 180, 30d supply, fill #0

## 2023-12-18 MED ORDER — FUROSEMIDE 20 MG PO TABS
20.0000 mg | ORAL_TABLET | Freq: Two times a day (BID) | ORAL | 0 refills | Status: DC
  Filled 2023-12-18: qty 20, 10d supply, fill #0

## 2023-12-18 MED ORDER — POTASSIUM CHLORIDE CRYS ER 20 MEQ PO TBCR
40.0000 meq | EXTENDED_RELEASE_TABLET | Freq: Every day | ORAL | 0 refills | Status: DC
  Filled 2023-12-18: qty 14, 7d supply, fill #0

## 2023-12-18 MED ORDER — OXYCODONE HCL 5 MG PO TABS
5.0000 mg | ORAL_TABLET | ORAL | 0 refills | Status: DC | PRN
Start: 2023-12-18 — End: 2024-03-20
  Filled 2023-12-18: qty 30, 6d supply, fill #0

## 2023-12-18 MED ORDER — IBUPROFEN 600 MG PO TABS
600.0000 mg | ORAL_TABLET | Freq: Four times a day (QID) | ORAL | 0 refills | Status: DC
  Filled 2023-12-18: qty 30, 8d supply, fill #0

## 2023-12-18 NOTE — Lactation Note (Signed)
 This note was copied from a baby's chart. Lactation Consultation Note  Patient Name: Jane Jackson [id number] Date: 12/18/2023 Age:40 days Reason for consult: Follow-up assessment;Infant weight loss;Early term 37-38.6wks (7 % weight loss,) As LC entered the room mom pumping the left breast. Per mom baby has been breast feeding well.  LC recommending using the DEBP and per mom due to cramping pumps one at a time. Plans to latch the baby on the 2nd breast.  LC reviewed supply and demand. LC reviewed engorgement prevention and tx.  Per mom as a hx of mastitis several times with her 1st baby. LC reviewed the S/S of mastitis  LC reviewed what can cause mastitis and easier to prevent than to deal with it after it happens.  Mom aware of the [facility] resources after D/C and storage of breastmilk.   Maternal Data Does the patient have breastfeeding experience prior to this delivery?: Yes  Feeding Mother's Current Feeding Choice: Breast Milk  LATCH Score - 10 - from the doc flow sheets.     Lactation Tools Discussed/Used Tools: Pump Breast pump type: Double-Electric Breast Pump Pump Education: Milk Storage;Setup, frequency, and cleaning Pumped volume:  (greater tnan 30- ml)  Interventions  Education   Discharge Discharge Education: Engorgement and breast care;Warning signs for feeding baby Pump: Personal;Manual;DEBP  Consult Status Consult Status: Complete Date: 12/18/23    Matilde Sprang Ari Bernabei 12/18/2023, 11:13 AM

## 2023-12-18 NOTE — Plan of Care (Signed)
 Problem: Health Behavior/Discharge Planning: Goal: Ability to manage health-related needs will improve Outcome: Completed/Met   Problem: Clinical Measurements: Goal: Ability to maintain clinical measurements within normal limits will improve Outcome: Completed/Met Goal: Will remain free from infection Outcome: Completed/Met Goal: Diagnostic test results will improve Outcome: Completed/Met Goal: Respiratory complications will improve Outcome: Completed/Met Goal: Cardiovascular complication will be avoided Outcome: Completed/Met   Problem: Coping: Goal: Level of anxiety will decrease Outcome: Completed/Met   Problem: Elimination: Goal: Will not experience complications related to bowel motility Outcome: Completed/Met   Problem: Pain Managment: Goal: General experience of comfort will improve and/or be controlled Outcome: Completed/Met   Problem: Safety: Goal: Ability to remain free from injury will improve Outcome: Completed/Met   Problem: Skin Integrity: Goal: Risk for impaired skin integrity will decrease Outcome: Completed/Met   Problem: Education: Goal: Ability to describe self-care measures that may prevent or decrease complications (Diabetes Survival Skills Education) will improve Outcome: Completed/Met Goal: Individualized Educational Video(s) Outcome: Completed/Met   Problem: Coping: Goal: Ability to adjust to condition or change in health will improve Outcome: Completed/Met   Problem: Fluid Volume: Goal: Ability to maintain a balanced intake and output will improve Outcome: Completed/Met   Problem: Health Behavior/Discharge Planning: Goal: Ability to identify and utilize available resources and services will improve Outcome: Completed/Met Goal: Ability to manage health-related needs will improve Outcome: Completed/Met   Problem: Metabolic: Goal: Ability to maintain appropriate glucose levels will improve Outcome: Completed/Met   Problem:  Nutritional: Goal: Maintenance of adequate nutrition will improve Outcome: Completed/Met Goal: Progress toward achieving an optimal weight will improve Outcome: Completed/Met   Problem: Skin Integrity: Goal: Risk for impaired skin integrity will decrease Outcome: Completed/Met   Problem: Tissue Perfusion: Goal: Adequacy of tissue perfusion will improve Outcome: Completed/Met   Problem: Education: Goal: Knowledge of Childbirth will improve Outcome: Completed/Met Goal: Ability to make informed decisions regarding treatment and plan of care will improve Outcome: Completed/Met Goal: Ability to state and carry out methods to decrease the pain will improve Outcome: Completed/Met Goal: Individualized Educational Video(s) Outcome: Completed/Met   Problem: Coping: Goal: Ability to verbalize concerns and feelings about labor and delivery will improve Outcome: Completed/Met   Problem: Life Cycle: Goal: Ability to make normal progression through stages of labor will improve Outcome: Completed/Met Goal: Ability to effectively push during vaginal delivery will improve Outcome: Completed/Met   Problem: Role Relationship: Goal: Will demonstrate positive interactions with the child Outcome: Completed/Met   Problem: Safety: Goal: Risk of complications during labor and delivery will decrease Outcome: Completed/Met   Problem: Pain Management: Goal: Relief or control of pain from uterine contractions will improve Outcome: Completed/Met   Problem: Education: Goal: Knowledge of disease or condition will improve Outcome: Completed/Met Goal: Knowledge of the prescribed therapeutic regimen will improve Outcome: Completed/Met   Problem: Fluid Volume: Goal: Peripheral tissue perfusion will improve Outcome: Completed/Met   Problem: Clinical Measurements: Goal: Complications related to disease process, condition or treatment will be avoided or minimized Outcome: Completed/Met   Problem:  Education: Goal: Knowledge of condition will improve Outcome: Completed/Met Goal: Individualized Educational Video(s) Outcome: Completed/Met Goal: Individualized Newborn Educational Video(s) Outcome: Completed/Met   Problem: Activity: Goal: Will verbalize the importance of balancing activity with adequate rest periods Outcome: Completed/Met Goal: Ability to tolerate increased activity will improve Outcome: Completed/Met   Problem: Coping: Goal: Ability to identify and utilize available resources and services will improve Outcome: Completed/Met   Problem: Life Cycle: Goal: Chance of risk for complications during the postpartum period will decrease Outcome:  Completed/Met   Problem: Role Relationship: Goal: Ability to demonstrate positive interaction with newborn will improve Outcome: Completed/Met   Problem: Skin Integrity: Goal: Demonstration of wound healing without infection will improve Outcome: Completed/Met

## 2023-12-20 ENCOUNTER — Ambulatory Visit (INDEPENDENT_AMBULATORY_CARE_PROVIDER_SITE_OTHER): Admitting: Family Medicine

## 2023-12-20 VITALS — BP 149/93 | HR 73 | Wt 334.0 lb

## 2023-12-20 DIAGNOSIS — O1093 Unspecified pre-existing hypertension complicating the puerperium: Secondary | ICD-10-CM | POA: Diagnosis not present

## 2023-12-20 DIAGNOSIS — O10919 Unspecified pre-existing hypertension complicating pregnancy, unspecified trimester: Secondary | ICD-10-CM

## 2023-12-20 MED ORDER — ENALAPRIL MALEATE 10 MG PO TABS: 10.0000 mg | ORAL_TABLET | Freq: Every day | ORAL | 3 refills | Status: DC

## 2023-12-20 MED ORDER — AMLODIPINE BESYLATE 10 MG PO TABS
10.0000 mg | ORAL_TABLET | Freq: Every day | ORAL | 3 refills | Status: DC
Start: 2023-12-20 — End: 2024-04-16

## 2023-12-20 NOTE — Progress Notes (Signed)
 Subjective:  Jane Jackson is a 38 y.o. female here for BP check.   Hypertension ROS: Patient denies any headaches, visual symptoms, RUQ/epigastric pain or other concerning symptoms.  Objective:  LMP 04/08/2023   Appearance pt notes not feeling well. General exam BP noted to be elevated today in office.    Assessment:   Blood Pressure .   Plan:  Discuss with provider/medication change.  . needs improvement

## 2023-12-21 ENCOUNTER — Encounter: Payer: Self-pay | Admitting: Family Medicine

## 2023-12-21 ENCOUNTER — Telehealth (HOSPITAL_COMMUNITY): Payer: Self-pay

## 2023-12-21 DIAGNOSIS — A09 Infectious gastroenteritis and colitis, unspecified: Secondary | ICD-10-CM

## 2023-12-21 LAB — COMPREHENSIVE METABOLIC PANEL WITH GFR
ALT: 43 IU/L — ABNORMAL HIGH (ref 0–32)
AST: 38 IU/L (ref 0–40)
Albumin: 3.7 g/dL — ABNORMAL LOW (ref 3.9–4.9)
Alkaline Phosphatase: 136 IU/L — ABNORMAL HIGH (ref 44–121)
BUN/Creatinine Ratio: 14 (ref 9–23)
BUN: 12 mg/dL (ref 6–24)
Bilirubin Total: 0.2 mg/dL (ref 0.0–1.2)
CO2: 22 mmol/L (ref 20–29)
Calcium: 9.2 mg/dL (ref 8.7–10.2)
Chloride: 101 mmol/L (ref 96–106)
Creatinine, Ser: 0.84 mg/dL (ref 0.57–1.00)
Globulin, Total: 2.6 g/dL (ref 1.5–4.5)
Glucose: 126 mg/dL — ABNORMAL HIGH (ref 70–99)
Potassium: 4.6 mmol/L (ref 3.5–5.2)
Sodium: 140 mmol/L (ref 134–144)
Total Protein: 6.3 g/dL (ref 6.0–8.5)
eGFR: 90 mL/min/{1.73_m2} (ref >59)

## 2023-12-21 LAB — CBC
Hematocrit: 35.6 % (ref 34.0–46.6)
Hemoglobin: 11.4 g/dL (ref 11.1–15.9)
MCH: 28.7 pg (ref 26.6–33.0)
MCHC: 32 g/dL (ref 31.5–35.7)
MCV: 90 fL (ref 79–97)
Platelets: 311 10*3/uL (ref 150–450)
RBC: 3.97 x10E6/uL (ref 3.77–5.28)
RDW: 14.2 % (ref 11.7–15.4)
WBC: 9.9 10*3/uL (ref 3.4–10.8)

## 2023-12-21 NOTE — Telephone Encounter (Signed)
 Opened in error

## 2023-12-24 NOTE — Progress Notes (Signed)
   GYNECOLOGY PROBLEM  VISIT ENCOUNTER NOTE  Subjective:   Jane Jackson is a 38 y.o. [phone] female here for a problem GYN visit.  Current complaints: BP check and follow up HELLP.   Denies abnormal vaginal bleeding, discharge, pelvic pain, problems with intercourse or other gynecologic concerns.    Gynecologic History Patient's last menstrual period was 04/08/2023.  Contraception: none  Health Maintenance Due  Topic Date Due   COVID-19 Vaccine (1) Never done   Pneumococcal Vaccine 25-41 Years old (1 of 2 - PCV) Never done   FOOT EXAM  Never done   OPHTHALMOLOGY EXAM  Never done    The following portions of the patient's history were reviewed and updated as appropriate: allergies, current medications, past family history, past medical history, past social history, past surgical history and problem list.  Review of Systems Pertinent items are noted in HPI.   Objective:  BP (!) 149/93   Pulse 73   Wt (!) 334 lb (151.5 kg)   LMP 04/08/2023   BMI 55.58 kg/m   Gen: well appearing, NAD HEENT: no scleral icterus CV: RR Lung: Normal WOB Ext: warm well perfused   Assessment and Plan:  1. Chronic hypertension during pregnancy, antepartum (Primary) - Discussed change to CCB and ACE to help with BP control.  - D/c labetalol - Comprehensive metabolic panel with GFR - CBC - amLODipine (NORVASC) 10 MG tablet; Take 1 tablet (10 mg total) by mouth daily.  Dispense: 30 tablet; Refill: 3 - enalapril (VASOTEC) 10 MG tablet; Take 1 tablet (10 mg total) by mouth daily.  Dispense: 30 tablet; Refill: 3  2. Preexisting hypertension complicating pregnancy, antepartum - Comprehensive metabolic panel with GFR - CBC - amLODipine (NORVASC) 10 MG tablet; Take 1 tablet (10 mg total) by mouth daily.  Dispense: 30 tablet; Refill: 3 - enalapril (VASOTEC) 10 MG tablet; Take 1 tablet (10 mg total) by mouth daily.  Dispense: 30 tablet; Refill: 3   Please refer to After Visit Summary for other  counseling recommendations.   Return in about 1 week (around 12/27/2023) for BP check.  Future Appointments  Date Time Provider Department Center  12/25/2023  3:50 PM [facility] NURSE [facility]  01/24/2024  8:30 AM [facility] LAB [facility]  01/24/2024  8:55 AM Alvester Morin, Isa Rankin, MD [facility]    Federico Flake, MD, MPH, ABFM Attending Physician Faculty Practice- [facility]

## 2023-12-25 ENCOUNTER — Other Ambulatory Visit: Payer: Self-pay | Admitting: Family Medicine

## 2023-12-25 ENCOUNTER — Ambulatory Visit

## 2023-12-25 VITALS — BP 136/90 | HR 94

## 2023-12-25 DIAGNOSIS — O10919 Unspecified pre-existing hypertension complicating pregnancy, unspecified trimester: Secondary | ICD-10-CM

## 2023-12-25 MED ORDER — FUROSEMIDE 20 MG PO TABS: 40.0000 mg | ORAL_TABLET | Freq: Every day | ORAL | 0 refills | Status: DC

## 2023-12-25 MED ORDER — POTASSIUM CHLORIDE CRYS ER 20 MEQ PO TBCR: 20.0000 meq | EXTENDED_RELEASE_TABLET | Freq: Two times a day (BID) | ORAL | 0 refills | Status: DC

## 2023-12-25 NOTE — Progress Notes (Signed)
 Subjective:  Jane Jackson is a 38 y.o. female here for BP check.   Hypertension ROS: Patient denies any headaches, visual symptoms, RUQ/epigastric pain or other concerning symptoms.  Objective:  BP (!) 136/90   Pulse 94   LMP 04/08/2023   Appearance alert, well appearing, and in no distress. General exam BP noted to be controlled today in office.    Assessment:   Blood Pressure improved.   Plan:  Continue B/P Medications. I consulted with provider in office Rx for Lasix and Potassium refilled as pt requested.pt advised to keep PP appointment .

## 2023-12-26 ENCOUNTER — Telehealth (HOSPITAL_COMMUNITY): Payer: Self-pay

## 2023-12-26 NOTE — Telephone Encounter (Signed)
 Encounter opened in error

## 2024-01-20 MED ORDER — METRONIDAZOLE 500 MG PO TABS: 500.0000 mg | ORAL_TABLET | Freq: Two times a day (BID) | ORAL | 0 refills | Status: AC

## 2024-01-24 ENCOUNTER — Ambulatory Visit: Admitting: Family Medicine

## 2024-01-24 ENCOUNTER — Other Ambulatory Visit

## 2024-02-04 ENCOUNTER — Other Ambulatory Visit

## 2024-02-04 ENCOUNTER — Ambulatory Visit (INDEPENDENT_AMBULATORY_CARE_PROVIDER_SITE_OTHER): Admitting: Obstetrics and Gynecology

## 2024-02-04 VITALS — BP 152/90 | HR 89 | Wt 328.0 lb

## 2024-02-04 DIAGNOSIS — R7302 Impaired glucose tolerance (oral): Secondary | ICD-10-CM | POA: Diagnosis not present

## 2024-02-04 DIAGNOSIS — R195 Other fecal abnormalities: Secondary | ICD-10-CM

## 2024-02-04 DIAGNOSIS — O99345 Other mental disorders complicating the puerperium: Secondary | ICD-10-CM

## 2024-02-04 DIAGNOSIS — F419 Anxiety disorder, unspecified: Secondary | ICD-10-CM

## 2024-02-04 DIAGNOSIS — O10919 Unspecified pre-existing hypertension complicating pregnancy, unspecified trimester: Secondary | ICD-10-CM

## 2024-02-04 DIAGNOSIS — F32A Depression, unspecified: Secondary | ICD-10-CM

## 2024-02-04 NOTE — Progress Notes (Unsigned)
    Post Partum Visit Note  Jane Jackson is a 38 y.o. [id number] s/p 3/21 SVD/intact perineum at 38wks. Pregnancy c/b AMA, CHTN with h/o PSVT, BMI 50s, GDM on insulin , anxiety/depression. Patient also d/w chorio in labor and severe pre-eclampsia postpartum/atypical HELLP based on LFTs.  Anesthesia: epidural. . Baby is doing well. Baby is feeding by breast. Bleeding no bleeding. Bowel function: patient having loose stools. Bladder function is normal. Patient is not sexually active. Contraception method is none. Postpartum depression screening: EPDS 15 .  She didn't take her BP meds (norvasc  10 qday, enalapril  10 qday) today.   Increased stress and anxiety due to at home stressors. She is currently on leave from her two jobs and her husband stays at home. She states that she is getting good amount of sleep at night>6-7h.   Edinburgh Postnatal Depression Scale - 02/04/24 0845       Edinburgh Postnatal Depression Scale:  In the Past 7 Days   I have been able to laugh and see the funny side of things. 1    I have looked forward with enjoyment to things. 2    I have blamed myself unnecessarily when things went wrong. 2    I have been anxious or worried for no good reason. 2    I have felt scared or panicky for no good reason. 3    Things have been getting on top of me. 2    I have been so unhappy that I have had difficulty sleeping. 1    I have felt sad or miserable. 1    I have been so unhappy that I have been crying. 1    The thought of harming myself has occurred to me. 0    Edinburgh Postnatal Depression Scale Total 15             Review of Systems Pertinent items are noted in HPI.  Objective:  BP (!) 152/90   Pulse 89   Wt (!) 328 lb (148.8 kg)   LMP 04/08/2023   Breastfeeding Yes   BMI 54.58 kg/m    General: NAD  Assessment:   Patient stable  Plan:  *PP: I strongly cautioned against future pregnancy, given her current co-morbidities. She declines anything for  contraception, and I also d/w her re: our vasectomy clinic. At the very least, Id/w her re: need to space out pregnancies, as she's had two babies in the past 19 months.  Last pap 12/2021 negative *CV: message sent to Dr. Emmette Harms to arrange PP follow up. Encouraged patient to continue BP meds *Endocrine: 2h GTT today *Psych: Patient states she had bad reaction to SSRIs and has used benzos in the past. She does not have a BH provider. Referral to Jamie and I offered her PRN vistaril  but patient declines.  *Loose stools: I told her need to go to Urgent care for evaluation, since we don't have the testing kits; patient given IV abx given during her labor due to chorio. GI referral also made  RTC: 6 weeks for annual  Jane Caller, MD Center for [employer], [facility]

## 2024-02-05 DIAGNOSIS — R195 Other fecal abnormalities: Secondary | ICD-10-CM | POA: Insufficient documentation

## 2024-02-05 DIAGNOSIS — F32A Depression, unspecified: Secondary | ICD-10-CM | POA: Insufficient documentation

## 2024-02-05 LAB — COMPREHENSIVE METABOLIC PANEL WITH GFR
ALT: 37 IU/L — ABNORMAL HIGH (ref 0–32)
AST: 22 IU/L (ref 0–40)
Albumin: 4.3 g/dL (ref 3.9–4.9)
Alkaline Phosphatase: 112 IU/L (ref 44–121)
BUN/Creatinine Ratio: 18 (ref 9–23)
BUN: 16 mg/dL (ref 6–24)
Bilirubin Total: 0.2 mg/dL (ref 0.0–1.2)
CO2: 21 mmol/L (ref 20–29)
Calcium: 10 mg/dL (ref 8.7–10.2)
Chloride: 102 mmol/L (ref 96–106)
Creatinine, Ser: 0.9 mg/dL (ref 0.57–1.00)
Globulin, Total: 2.4 g/dL (ref 1.5–4.5)
Glucose: 109 mg/dL — ABNORMAL HIGH (ref 70–99)
Potassium: 4.5 mmol/L (ref 3.5–5.2)
Sodium: 141 mmol/L (ref 134–144)
Total Protein: 6.7 g/dL (ref 6.0–8.5)
eGFR: 83 mL/min/{1.73_m2} (ref >59)

## 2024-02-05 LAB — GLUCOSE TOLERANCE, 2 HOURS
Glucose, 2 hour: 101 mg/dL (ref 70–139)
Glucose, GTT - Fasting: 114 mg/dL — ABNORMAL HIGH (ref 70–99)

## 2024-02-11 ENCOUNTER — Ambulatory Visit: Payer: Self-pay | Admitting: Obstetrics and Gynecology

## 2024-02-11 DIAGNOSIS — R7401 Elevation of levels of liver transaminase levels: Secondary | ICD-10-CM | POA: Insufficient documentation

## 2024-02-21 ENCOUNTER — Telehealth: Payer: Self-pay | Admitting: Cardiology

## 2024-02-21 NOTE — Telephone Encounter (Signed)
 Calling to see if its possible to have a virtual appt. Please advise

## 2024-02-27 NOTE — Telephone Encounter (Signed)
 Left message for patient to return the call.

## 2024-02-28 ENCOUNTER — Telehealth: Payer: Self-pay | Admitting: Clinical

## 2024-02-28 NOTE — Telephone Encounter (Signed)
Attempt call regarding referral; Left HIPPA-compliant message to call back Mikle Sternberg from [facility] at  [phone] (Ashelyn Mccravy's office).    

## 2024-03-05 ENCOUNTER — Telehealth: Payer: Self-pay

## 2024-03-05 NOTE — Telephone Encounter (Signed)
 Left message to notify pt of gastro appt date / time.

## 2024-03-16 ENCOUNTER — Other Ambulatory Visit: Payer: Self-pay | Admitting: Family Medicine

## 2024-03-16 DIAGNOSIS — O10919 Unspecified pre-existing hypertension complicating pregnancy, unspecified trimester: Secondary | ICD-10-CM

## 2024-03-20 ENCOUNTER — Encounter: Payer: Self-pay | Admitting: Family Medicine

## 2024-03-20 ENCOUNTER — Ambulatory Visit (INDEPENDENT_AMBULATORY_CARE_PROVIDER_SITE_OTHER): Admitting: Family Medicine

## 2024-03-20 DIAGNOSIS — Z01419 Encounter for gynecological examination (general) (routine) without abnormal findings: Secondary | ICD-10-CM | POA: Diagnosis not present

## 2024-03-20 DIAGNOSIS — Z1331 Encounter for screening for depression: Secondary | ICD-10-CM

## 2024-03-20 DIAGNOSIS — R102 Pelvic and perineal pain: Secondary | ICD-10-CM

## 2024-03-20 DIAGNOSIS — F418 Other specified anxiety disorders: Secondary | ICD-10-CM

## 2024-03-20 DIAGNOSIS — O99345 Other mental disorders complicating the puerperium: Secondary | ICD-10-CM

## 2024-03-20 NOTE — Progress Notes (Unsigned)
 Patient presents for Annual.  LMP: No LMP recorded.  Last pap: 2023-WNL  Contraception:  Mammogram: Not yet indicated STD Screening: not indicated Flu Vaccine : N/A  CC: Pelvic pain and vaginal pain

## 2024-03-20 NOTE — Progress Notes (Unsigned)
   GYNECOLOGY ANNUAL PREVENTATIVE CARE ENCOUNTER NOTE  Subjective:  Jane Jackson is a 38 y.o. [phone] female here for a routine annual gynecologic exam.  Current complaints: ***.     Menses are {Regular/irregular menstrual period abdominal pain hpi md:30583} Having perimenopausal sx? {yes/no:20286} Current sx are:***   Denies abnormal vaginal bleeding, discharge, pelvic pain, problems with intercourse or other gynecologic concerns.    Gynecologic History No LMP recorded. Contraception: {method:5051} Last Pap: ***. Results were: {norm/abn:16337} Last mammogram: ***. Results were: {norm/abn:16337}  Health Maintenance Due  Topic Date Due   COVID-19 Vaccine (1) Never done   FOOT EXAM  Never done   OPHTHALMOLOGY EXAM  Never done   Diabetic kidney evaluation - Urine ACR  Never done   Pneumococcal Vaccine 53-79 Years old (1 of 2 - PCV) Never done   Hepatitis B Vaccines (1 of 3 - 19+ 3-dose series) Never done   HPV VACCINES (1 - 3-dose SCDM series) Never done    The following portions of the patient's history were reviewed and updated as appropriate: allergies, current medications, past family history, past medical history, past social history, past surgical history and problem list.  Review of Systems {ros; complete:30496}   Objective:  There were no vitals taken for this visit. CONSTITUTIONAL: Well-developed, well-nourished female in no acute distress.  HENT:  Normocephalic, atraumatic, External right and left ear normal. Oropharynx is clear and moist EYES:  No scleral icterus.  NECK: Normal range of motion, supple, no masses.  Normal thyroid .  SKIN: Skin is warm and dry. No rash noted. Not diaphoretic. No erythema. No pallor. NEUROLOGIC: Alert and oriented to person, place, and time. Normal reflexes, muscle tone coordination. No cranial nerve deficit noted. PSYCHIATRIC: Normal mood and affect. Normal behavior. Normal judgment and thought content. CARDIOVASCULAR: Normal heart  rate noted, regular rhythm. 2+ distal pulses. RESPIRATORY: Effort and breath sounds normal, no problems with respiration noted. BREASTS: Symmetric in size. No masses, skin changes, nipple drainage, or lymphadenopathy. ABDOMEN: Soft,  no distention noted.  No tenderness, rebound or guarding.  PELVIC: Normal appearing external genitalia; normal appearing vaginal mucosa and cervix.  No abnormal discharge noted.  ***Pap smear obtained.  Normal uterine size, no other palpable masses, no uterine or adnexal tenderness. Chaperone present for exam MUSCULOSKELETAL: Normal range of motion.   PROCEDURE NOTE: (***if Indicated for IUD, Nexplanon etc)    Assessment and Plan:  1) Annual gynecologic examination ***with pap smear:  Will follow up results of pap smear and manage accordingly. STI screening desired {yes/no:20286}.  Routine preventative health maintenance measures emphasized. Reviewed perimenopausal symptoms and management.   2) Contraception counseling: Reviewed all forms of birth control options available including abstinence; over the counter/barrier methods; hormonal contraceptive medication including pill, patch, ring, injection,contraceptive implant; hormonal and nonhormonal IUDs; permanent sterilization options including vasectomy and the various tubal sterilization modalities. Risks and benefits reviewed.  Questions were answered.  Written information was also given to the patient to review.  Patient desires ***, this was prescribed for patient. She will follow up in  *** for surveillance.  She was told to call with any further questions, or with any concerns about this method of contraception.  Emphasized use of condoms 100% of the time for STI prevention.  There are no diagnoses linked to this encounter.  Please refer to After Visit Summary for other counseling recommendations.   No follow-ups on file.  Suzen Maryan Masters, MD, MPH, ABFM Attending Physician Center for [facility]

## 2024-03-22 ENCOUNTER — Encounter: Payer: Self-pay | Admitting: Family Medicine

## 2024-03-22 MED ORDER — LORAZEPAM 0.5 MG PO TABS: 0.5000 mg | ORAL_TABLET | Freq: Two times a day (BID) | ORAL | 0 refills | Status: AC | PRN

## 2024-03-22 MED ORDER — HYDROXYZINE HCL 25 MG PO TABS: 25.0000 mg | ORAL_TABLET | Freq: Four times a day (QID) | ORAL | 2 refills | Status: AC | PRN

## 2024-03-22 MED ORDER — BUPROPION HCL ER (SR) 100 MG PO TB12: 100.0000 mg | ORAL_TABLET | Freq: Two times a day (BID) | ORAL | 3 refills | Status: DC

## 2024-04-14 ENCOUNTER — Other Ambulatory Visit: Payer: Self-pay | Admitting: Family Medicine

## 2024-04-14 DIAGNOSIS — O99345 Other mental disorders complicating the puerperium: Secondary | ICD-10-CM

## 2024-04-15 NOTE — Progress Notes (Unsigned)
 Jane Jackson [id number] Apr 19, 1986   Chief Complaint: Diarrhea  Referring Provider: Izell Harari, MD Primary GI MD: Dr. Legrand  HPI: Jane Jackson is a 38 y.o. female with past medical history of anxiety and depression, asthma, anesthesia complication, diverticulitis, GERD, HTN, MVP, bowel impaction, kidney stones, gestational diabetes, appendectomy, cholecystectomy 2017 who presents today for a complaint of diarrhea.    Patient last seen in our office 01/17/2016 by Dr. Legrand for complaint of RUQ abdominal pain, nausea, vomiting, and stool impaction.  Had recent hospital stay at [facility] and [facility] with extensive workup.  There was questionable inflammation in segment of the right colon on initial CT scan at [facility], none on the CT scan done at [facility].  Original ultrasound showed no gallstone, there was a gallstone on the subsequent CT scan.  EGD was normal.  MRCP was normal.  No HIDA scan done.  See note for more details.  Primary concern at that visit was that patient was at risk for volume depletion and she was admitted for observation and IV fluids, with surgical consult to see if a diagnostic laparoscopy would be indicated.  She did end up having laparoscopic cholecystectomy during that admission.  Patient is 4 months postpartum.   Previous GI Procedures/Imaging      Past Medical History:  Diagnosis Date   Anxiety    Asthma    Chicken pox    Complication of anesthesia    issue with asthma and intubation   Cystic dysplasia of one kidney    about 4 months ago-no f/u- largest 5mm?   Diverticulitis    Family history of cystic fibrosis    Normal CF screen   Family history of mental retardation    Fibroid, uterine    dx a few months ago   GERD (gastroesophageal reflux disease)    Gestational diabetes    Glucose intolerance (impaired glucose tolerance) 07/08/2023   Based on 2 hour postpartum GTT     H/O mitral valve prolapse    as a child   History of mitral valve prolapse  09/16/2016   Hypertension    Impaction, bowel (HCC)    Inappropriate sinus tachycardia (HCC)    a. exacerbated by pregnancy - 2018; b. 01/2017 Holter: 29% of time in sinus tachycardia;  c. 02/2017 Echo: EF 65-70%, no rwma; c. 01/2017 Holter: 29% of time in sinus tachycardia.   Kidney stones    Prediabetes 01/16/2022   Upper respiratory tract infection 05/27/2020    Past Surgical History:  Procedure Laterality Date   APPENDECTOMY     CHOLECYSTECTOMY N/A 01/18/2016   Procedure: LAPAROSCOPIC CHOLECYSTECTOMY WITH INTRAOPERATIVE CHOLANGIOGRAM;  Surgeon: Morene Olives, MD;  Location: [facility];  Service: General;  Laterality: N/A;   ruptured cyst     TONSILLECTOMY AND ADENOIDECTOMY     age 1   TYMPANOSTOMY TUBE PLACEMENT      Current Outpatient Medications  Medication Sig Dispense Refill   albuterol  (VENTOLIN  HFA) 108 (90 Base) MCG/ACT inhaler Inhale 1 puff into the lungs every 6 (six) hours as needed for wheezing or shortness of breath.     amLODipine  (NORVASC ) 10 MG tablet Take 1 tablet (10 mg total) by mouth daily. 30 tablet 3   buPROPion  ER (WELLBUTRIN  SR) 100 MG 12 hr tablet Take 1 tablet (100 mg total) by mouth 2 (two) times daily. 60 tablet 3   enalapril  (VASOTEC ) 10 MG tablet Take 1 tablet (10 mg total) by mouth daily. 30 tablet 3   furosemide  (LASIX )  20 MG tablet TAKE 2 TABLETS (40 MG TOTAL) BY MOUTH DAILY FOR 5 DAYS. TAKE 1 TABLET BY MOUTH DAILY FOR 5 DAYS. 5 tablet 0   hydrOXYzine  (ATARAX ) 25 MG tablet Take 1 tablet (25 mg total) by mouth every 6 (six) hours as needed for anxiety (use for panic attacks). 30 tablet 2   LORazepam  (ATIVAN ) 0.5 MG tablet Take 1 tablet (0.5 mg total) by mouth 2 (two) times daily as needed for anxiety (Use only if hydroxyzine  is not helping panic attack). 5 tablet 0   potassium chloride  SA (KLOR-CON  M) 20 MEQ tablet Take 1 tablet (20 mEq total) by mouth 2 (two) times daily. (Patient not taking: Reported on 02/04/2024) 10 tablet 0   Prenatal Vit-Fe  Fumarate-FA (PRENATAL MULTIVITAMIN) TABS tablet Take 1 tablet by mouth daily at 12 noon.     No current facility-administered medications for this visit.    Allergies as of 04/16/2024 - Review Complete 03/22/2024  Allergen Reaction Noted   Latex Itching and Rash 10/25/2015   Procardia  [nifedipine ] Other (See Comments) 12/15/2023    Family History  Problem Relation Age of Onset   Arthritis Mother    Hypertension Mother    Diabetes Mother    Cervical cancer Mother    Alcohol abuse Father    Arthritis Father    Hypertension Father    Arthritis Maternal Grandmother    Heart disease Maternal Grandmother    Stroke Maternal Grandmother    Hypertension Maternal Grandmother    Diabetes Maternal Grandmother    Arthritis Maternal Grandfather    Colon cancer Maternal Grandfather    Prostate cancer Maternal Grandfather    Heart disease Maternal Grandfather    Stroke Maternal Grandfather    Hypertension Maternal Grandfather    Arthritis Paternal Grandmother    Heart disease Paternal Grandmother    Hypertension Paternal Grandmother    Diabetes Paternal Grandmother    Arthritis Paternal Grandfather    Heart disease Paternal Grandfather    Hypertension Paternal Grandfather    Thyroid  disease Maternal Aunt        thyroid  cancer    Social History   Tobacco Use   Smoking status: Former    Current packs/day: 0.00    Types: Cigarettes    Quit date: 03/03/2008    Years since quitting: 16.1   Smokeless tobacco: Never   Tobacco comments:    Smoked occ, no smoking for years, per pt  Vaping Use   Vaping status: Never Used  Substance Use Topics   Alcohol use: Not Currently    Comment: last use 10/28/2021 one drink   Drug use: No     Review of Systems:    Constitutional: No weight loss, fever, chills, weakness or fatigue Eyes: No change in vision Ears, Nose, Throat:  No change in hearing or congestion Skin: No rash or itching Cardiovascular: No chest pain, chest pressure or  palpitations   Respiratory: No SOB or cough Gastrointestinal: See HPI and otherwise negative Genitourinary: No dysuria or change in urinary frequency Neurological: No headache, dizziness or syncope Musculoskeletal: No new muscle or joint pain Hematologic: No bleeding or bruising    Physical Exam:  Vital signs: There were no vitals taken for this visit.  Constitutional: NAD, Well developed, Well nourished, alert and cooperative Head:  Normocephalic and atraumatic.  Eyes: No scleral icterus. Conjunctiva pink. Mouth: No oral lesions. Respiratory: Respirations even and unlabored. Lungs clear to auscultation bilaterally.  No wheezes, crackles, or rhonchi.  Cardiovascular:  Regular  rate and rhythm. No murmurs. No peripheral edema. Gastrointestinal:  Soft, nondistended, nontender. No rebound or guarding. Normal bowel sounds. No appreciable masses or hepatomegaly. Rectal:  Not performed.  Neurologic:  Alert and oriented x4;  grossly normal neurologically.  Skin:   Dry and intact without significant lesions or rashes. Psychiatric: Oriented to person, place and time. Demonstrates good judgement and reason without abnormal affect or behaviors.   RELEVANT LABS AND IMAGING: CBC    Component Value Date/Time   WBC 9.9 12/20/2023 1145   WBC 11.3 (H) 12/17/2023 0553   RBC 3.97 12/20/2023 1145   RBC 3.49 (L) 12/17/2023 0553   HGB 11.4 12/20/2023 1145   HCT 35.6 12/20/2023 1145   PLT 311 12/20/2023 1145   MCV 90 12/20/2023 1145   MCH 28.7 12/20/2023 1145   MCH 29.5 12/17/2023 0553   MCHC 32.0 12/20/2023 1145   MCHC 33.6 12/17/2023 0553   RDW 14.2 12/20/2023 1145   LYMPHSABS 2.1 12/15/2023 0726   LYMPHSABS 1.8 06/10/2023 1431   MONOABS 0.9 12/15/2023 0726   EOSABS 0.3 12/15/2023 0726   EOSABS 0.2 06/10/2023 1431   BASOSABS 0.1 12/15/2023 0726   BASOSABS 0.0 06/10/2023 1431    CMP     Component Value Date/Time   NA 141 02/04/2024 0925   K 4.5 02/04/2024 0925   CL 102 02/04/2024  0925   CO2 21 02/04/2024 0925   GLUCOSE 109 (H) 02/04/2024 0925   GLUCOSE 134 (H) 12/17/2023 0553   BUN 16 02/04/2024 0925   CREATININE 0.90 02/04/2024 0925   CALCIUM  10.0 02/04/2024 0925   PROT 6.7 02/04/2024 0925   ALBUMIN 4.3 02/04/2024 0925   AST 22 02/04/2024 0925   ALT 37 (H) 02/04/2024 0925   ALKPHOS 112 02/04/2024 0925   BILITOT 0.2 02/04/2024 0925   GFRNONAA >60 12/17/2023 0553   GFRAA 99 06/06/2020 1014   Echocardiogram 12/16/2023 1. Left ventricular ejection fraction, by estimation, is 60 to 65% . The left ventricle has normal function. The left ventricle has no regional wall motion abnormalities. Left ventricular diastolic parameters were normal. 2. Right ventricular systolic function is normal. The right ventricular size is normal.  3. The mitral valve is normal in structure. Trivial mitral valve regurgitation. No evidence of mitral stenosis.  4. The aortic valve is normal in structure. Aortic valve regurgitation is not visualized. No aortic stenosis is present.  5. The inferior vena cava is normal in size with greater than 50% respiratory variability, suggesting right atrial pressure of 3 mmHg.  Assessment/Plan:       Camie Furbish, PA-C  Gastroenterology 04/15/2024, 7:10 PM  Patient Care Team: Pcp, No as PCP - General Tobb, Kardie, DO as PCP - Cardiology (Cardiology) Darron Deatrice LABOR, MD as Consulting Physician (Cardiology)

## 2024-04-16 ENCOUNTER — Ambulatory Visit: Admitting: Gastroenterology

## 2024-04-16 ENCOUNTER — Encounter: Payer: Self-pay | Admitting: Gastroenterology

## 2024-04-16 ENCOUNTER — Telehealth: Payer: Self-pay | Admitting: Gastroenterology

## 2024-04-16 ENCOUNTER — Other Ambulatory Visit (INDEPENDENT_AMBULATORY_CARE_PROVIDER_SITE_OTHER)

## 2024-04-16 VITALS — BP 122/76 | HR 85 | Ht 65.0 in | Wt 321.4 lb

## 2024-04-16 DIAGNOSIS — K649 Unspecified hemorrhoids: Secondary | ICD-10-CM | POA: Diagnosis not present

## 2024-04-16 DIAGNOSIS — R109 Unspecified abdominal pain: Secondary | ICD-10-CM | POA: Diagnosis not present

## 2024-04-16 DIAGNOSIS — R197 Diarrhea, unspecified: Secondary | ICD-10-CM | POA: Diagnosis not present

## 2024-04-16 DIAGNOSIS — R1013 Epigastric pain: Secondary | ICD-10-CM

## 2024-04-16 DIAGNOSIS — Z8619 Personal history of other infectious and parasitic diseases: Secondary | ICD-10-CM

## 2024-04-16 DIAGNOSIS — K625 Hemorrhage of anus and rectum: Secondary | ICD-10-CM

## 2024-04-16 LAB — IBC + FERRITIN
Ferritin: 14.2 ng/mL (ref 10.0–291.0)
Iron: 52 ug/dL (ref 42–145)
Saturation Ratios: 13.3 % — ABNORMAL LOW (ref 20.0–50.0)
TIBC: 392 ug/dL (ref 250.0–450.0)
Transferrin: 280 mg/dL (ref 212.0–360.0)

## 2024-04-16 LAB — COMPREHENSIVE METABOLIC PANEL WITH GFR
ALT: 25 U/L (ref 0–35)
AST: 16 U/L (ref 0–37)
Albumin: 4.7 g/dL (ref 3.5–5.2)
Alkaline Phosphatase: 107 U/L (ref 39–117)
BUN: 15 mg/dL (ref 6–23)
CO2: 27 meq/L (ref 19–32)
Calcium: 9.9 mg/dL (ref 8.4–10.5)
Chloride: 103 meq/L (ref 96–112)
Creatinine, Ser: 0.8 mg/dL (ref 0.40–1.20)
GFR: 92.14 mL/min (ref >60.00)
Glucose, Bld: 95 mg/dL (ref 70–99)
Potassium: 4.2 meq/L (ref 3.5–5.1)
Sodium: 139 meq/L (ref 135–145)
Total Bilirubin: 0.5 mg/dL (ref 0.2–1.2)
Total Protein: 7.7 g/dL (ref 6.0–8.3)

## 2024-04-16 LAB — CBC WITH DIFFERENTIAL/PLATELET
Basophils Absolute: 0 K/uL (ref 0.0–0.1)
Basophils Relative: 0.4 % (ref 0.0–3.0)
Eosinophils Absolute: 0.2 K/uL (ref 0.0–0.7)
Eosinophils Relative: 1.7 % (ref 0.0–5.0)
HCT: 43.2 % (ref 36.0–46.0)
Hemoglobin: 14.5 g/dL (ref 12.0–15.0)
Lymphocytes Relative: 22.8 % (ref 12.0–46.0)
Lymphs Abs: 2.3 K/uL (ref 0.7–4.0)
MCHC: 33.5 g/dL (ref 30.0–36.0)
MCV: 89 fl (ref 78.0–100.0)
Monocytes Absolute: 0.7 K/uL (ref 0.1–1.0)
Monocytes Relative: 6.8 % (ref 3.0–12.0)
Neutro Abs: 6.8 K/uL (ref 1.4–7.7)
Neutrophils Relative %: 68.3 % (ref 43.0–77.0)
Platelets: 313 K/uL (ref 150.0–400.0)
RBC: 4.85 Mil/uL (ref 3.87–5.11)
RDW: 13.8 % (ref 11.5–15.5)
WBC: 9.9 K/uL (ref 4.0–10.5)

## 2024-04-16 LAB — TSH: TSH: 1.64 u[IU]/mL (ref 0.35–5.50)

## 2024-04-16 LAB — LIPASE: Lipase: 17 U/L (ref 11.0–59.0)

## 2024-04-16 LAB — C-REACTIVE PROTEIN: CRP: 1.5 mg/dL (ref 0.5–20.0)

## 2024-04-16 NOTE — Telephone Encounter (Signed)
 Please reach out to patient and let her know that omeprazole should be safe for her to take while breast-feeding, but I would like her to reach out to her pediatrician to okay this before we send in a prescription.  If they confirm it is okay for her to take, we can send 40 mg daily which she should take until her follow-up appointment.

## 2024-04-16 NOTE — Patient Instructions (Addendum)
 Your provider has requested that you go to the basement level for lab work before leaving today. Press B on the elevator. The lab is located at the first door on the left as you exit the elevator.   A high fiber diet with plenty of fluids (up to 8 glasses of water daily) is suggested to relieve these symptoms.  Benefiber, 1 tablespoon once or twice daily can be used to keep bowels regular if needed.  Avoid NSAIDS.  We will contact you regarding reflux medication, if appropriate while breastfeeding.   _______________________________________________________  If your blood pressure at your visit was 140/90 or greater, please contact your primary care physician to follow up on this.  _______________________________________________________  If you are age 6 or older, your body mass index should be between 23-30. Your Body mass index is 53.48 kg/m. If this is out of the aforementioned range listed, please consider follow up with your Primary Care Provider.  If you are age 91 or younger, your body mass index should be between 19-25. Your Body mass index is 53.48 kg/m. If this is out of the aformentioned range listed, please consider follow up with your Primary Care Provider.   ________________________________________________________  The [facility] providers would like to encourage you to use MYCHART to communicate with providers for non-urgent requests or questions.  Due to long hold times on the telephone, sending your provider a message by [facility] may be a faster and more efficient way to get a response.  Please allow 48 business hours for a response.  Please remember that this is for non-urgent requests.  _______________________________________________________  [facility] is using a team-based approach to care.  Your team is made up of your doctor and two to three APPS. Our APPS (Nurse Practitioners and Physician Assistants) work with your physician to ensure care continuity  for you. They are fully qualified to address your health concerns and develop a treatment plan. They communicate directly with your gastroenterologist to care for you. Seeing the Advanced Practice Practitioners on your physician's team can help you by facilitating care more promptly, often allowing for earlier appointments, access to diagnostic testing, procedures, and other specialty referrals.

## 2024-04-16 NOTE — Progress Notes (Unsigned)
 SABRA

## 2024-04-17 ENCOUNTER — Ambulatory Visit: Admitting: Family Medicine

## 2024-04-17 ENCOUNTER — Encounter: Payer: Self-pay | Admitting: Family Medicine

## 2024-04-17 DIAGNOSIS — F418 Other specified anxiety disorders: Secondary | ICD-10-CM

## 2024-04-17 DIAGNOSIS — Z1331 Encounter for screening for depression: Secondary | ICD-10-CM

## 2024-04-17 DIAGNOSIS — O99345 Other mental disorders complicating the puerperium: Secondary | ICD-10-CM

## 2024-04-17 LAB — IGA: Immunoglobulin A: 264 mg/dL (ref 47–310)

## 2024-04-17 LAB — TISSUE TRANSGLUTAMINASE, IGA: (tTG) Ab, IgA: <1 U/mL

## 2024-04-17 MED ORDER — BUPROPION HCL ER (SR) 150 MG PO TB12: 150.0000 mg | ORAL_TABLET | Freq: Two times a day (BID) | ORAL | 11 refills | Status: DC

## 2024-04-17 NOTE — Progress Notes (Signed)
 CC: Follow up Meds, requesting to increase dosage

## 2024-04-17 NOTE — Telephone Encounter (Signed)
 Pt informed via VM of providers recommendation. Advised to call our office with pediatrician recommendation.

## 2024-04-17 NOTE — Progress Notes (Signed)
 GYNECOLOGY CARE ENCOUNTER NOTE  Subjective:  Jane Jackson is a 38 y.o. [phone] female here for a routine annual gynecologic exam.  Current complaints:   Anxiety- feeling better. Feeling her brain is more calm. Improved sleep. Sometimes notes she is more awake if PM dose take later. She notes less frequent and less intense panic attacks. For example she was able to to GI yesterday and her today and reports previously she would have canceled one due to overwhelm. She feels improved and wonders could I have felt this good earlier. She reports improved overstimulation and general anxiety. Able to calm obsessive thoughts.   Pelvic Pain: scheduled with pelvic PT  Denies abnormal vaginal bleeding, discharge, pelvic pain, problems with intercourse or other gynecologic concerns.      04/17/2024   11:43 AM 03/20/2024   10:48 AM 06/10/2023    1:45 PM 01/15/2022    4:05 PM  GAD 7 : Generalized Anxiety Score  Nervous, Anxious, on Edge 1 3 1 2   Control/stop worrying 1 2 0 0  Worry too much - different things 1 2 1  0  Trouble relaxing 1 3 1 1   Restless 0 1 0 0  Easily annoyed or irritable 0 1 0 1  Afraid - awful might happen 0 1 0 0  Total GAD 7 Score 4 13 3 4   Anxiety Difficulty Not difficult at all Not difficult at all  Not difficult at all       04/17/2024   11:43 AM 03/20/2024   10:47 AM 06/10/2023    1:44 PM  PHQ9 SCORE ONLY  PHQ-9 Total Score 3 5 8       Gynecologic History No LMP recorded. Contraception: abstinence and condoms Last Pap: UTD.  Last mammogram: Start after breastfeeding is done for ~6 months  Health Maintenance Due  Topic Date Due   COVID-19 Vaccine (1) Never done   FOOT EXAM  Never done   OPHTHALMOLOGY EXAM  Never done   Diabetic kidney evaluation - Urine ACR  Never done   Pneumococcal Vaccine 46-58 Years old (1 of 2 - PCV) Never done   Hepatitis B Vaccines (1 of 3 - 19+ 3-dose series) Never done   HPV VACCINES (1 - 3-dose SCDM series) Never done    The  following portions of the patient's history were reviewed and updated as appropriate: allergies, current medications, past family history, past medical history, past social history, past surgical history and problem list.  Review of Systems Pertinent items are noted in HPI.   Objective:   CONSTITUTIONAL: Well-developed, well-nourished female in no acute distress.  HENT:  Normocephalic, atraumatic, External right and left ear normal. Oropharynx is clear and moist EYES:  No scleral icterus.  NECK: Normal range of motion, supple, no masses.  Normal thyroid .  SKIN: Skin is warm and dry. No rash noted. Not diaphoretic. No erythema. No pallor. NEUROLOGIC: Alert and oriented to person, place, and time. Normal reflexes, muscle tone coordination. No cranial nerve deficit noted. PSYCHIATRIC: Normal mood and affect. Normal behavior. Normal judgment and thought content. CARDIOVASCULAR: Normal heart rate noted, regular rhythm. 2+ distal pulses. RESPIRATORY: Effort and breath sounds normal, no problems with respiration noted. BREASTS: Symmetric in size. No masses, skin changes, nipple drainage, or lymphadenopathy. ABDOMEN: Soft,  no distention noted.  No tenderness, rebound or guarding.  PELVIC: Normal appearing external genitalia; normal appearing vaginal mucosa and cervix.  No abnormal discharge noted.  Normal uterine size, no other palpable masses, no uterine or adnexal tenderness. +  TTP over the bulbous muscle on the right, also tight as if in spasm. Chaperone present for exam MUSCULOSKELETAL: Normal range of motion.    Assessment and Plan:  1) Annual gynecologic examination without  pap smear:   Routine preventative health maintenance measures emphasized. Reviewed perimenopausal symptoms and management.   2) Contraception counseling: comfortable with her current plan  1. Postpartum anxiety Improved by sx and by GAD7 score!!  Doing better and wants dose increase to see if this helps residual sx.   Discussed ability to do slpit dosing 150 in AM and 100mg  PM - buPROPion  (WELLBUTRIN  SR) 150 MG 12 hr tablet; Take 1 tablet (150 mg total) by mouth 2 (two) times daily.  Dispense: 60 tablet; Refill: 11   Please refer to After Visit Summary for other counseling recommendations.   Return if symptoms worsen or fail to improve.  Future Appointments  Date Time Provider Department Center  05/29/2024 10:20 AM Arletta Camie FORBES DEVONNA [facility]  06/09/2024 12:30 PM Price, Celena BROCKS, PT [facility] None     Suzen Maryan Masters, MD, MPH, ABFM Attending Physician Center for [facility]

## 2024-04-19 ENCOUNTER — Ambulatory Visit: Payer: Self-pay | Admitting: Gastroenterology

## 2024-04-19 ENCOUNTER — Encounter: Payer: Self-pay | Admitting: Family Medicine

## 2024-04-19 DIAGNOSIS — F418 Other specified anxiety disorders: Secondary | ICD-10-CM

## 2024-04-20 NOTE — Progress Notes (Signed)
 ____________________________________________________________  Attending physician addendum:  Thank you for sending this case to me. I have reviewed the entire note and agree with the plan.   Amada Jupiter, MD  ____________________________________________________________

## 2024-04-21 MED ORDER — WELLBUTRIN SR 150 MG PO TB12: 150.0000 mg | ORAL_TABLET | Freq: Two times a day (BID) | ORAL | 11 refills | Status: DC

## 2024-05-12 ENCOUNTER — Encounter: Payer: Self-pay | Admitting: *Deleted

## 2024-05-19 ENCOUNTER — Telehealth: Payer: Self-pay | Admitting: Clinical

## 2024-05-19 NOTE — Telephone Encounter (Signed)
Attempt call regarding referral; Unable to leave voice message.   

## 2024-05-22 ENCOUNTER — Other Ambulatory Visit

## 2024-05-22 ENCOUNTER — Encounter

## 2024-05-22 ENCOUNTER — Ambulatory Visit: Payer: No Typology Code available for payment source | Admitting: Podiatry

## 2024-05-22 ENCOUNTER — Ambulatory Visit (INDEPENDENT_AMBULATORY_CARE_PROVIDER_SITE_OTHER)

## 2024-05-22 DIAGNOSIS — M722 Plantar fascial fibromatosis: Secondary | ICD-10-CM

## 2024-05-22 DIAGNOSIS — R1013 Epigastric pain: Secondary | ICD-10-CM

## 2024-05-22 DIAGNOSIS — K625 Hemorrhage of anus and rectum: Secondary | ICD-10-CM

## 2024-05-22 DIAGNOSIS — R109 Unspecified abdominal pain: Secondary | ICD-10-CM

## 2024-05-22 DIAGNOSIS — K649 Unspecified hemorrhoids: Secondary | ICD-10-CM

## 2024-05-22 DIAGNOSIS — Z8619 Personal history of other infectious and parasitic diseases: Secondary | ICD-10-CM

## 2024-05-22 DIAGNOSIS — R197 Diarrhea, unspecified: Secondary | ICD-10-CM

## 2024-05-22 MED ORDER — BETAMETHASONE SOD PHOS & ACET 6 (3-3) MG/ML IJ SUSP
3.0000 mg | Freq: Once | INTRAMUSCULAR | Status: AC
  Administered 2024-05-22: 3 mg via INTRA_ARTICULAR

## 2024-05-22 NOTE — Progress Notes (Signed)
 Chief Complaint  Patient presents with   Foot Pain    Patient states: left foot pain-under the bottom. Really painful after resting. Right ankle broken several years ago. Bilatleral ingrown  all the time .No RXOTC meds taken.    Subjective: 38 y.o. female presenting today as a new patient for evaluation of left heel pain ongoing for several months.  She has not done anything for treatment   Past Medical History:  Diagnosis Date   Anxiety    Asthma    Chicken pox    Complication of anesthesia    issue with asthma and intubation   Cystic dysplasia of one kidney    about 4 months ago-no f/u- largest 5mm?   Diverticulitis    Family history of cystic fibrosis    Normal CF screen   Family history of mental retardation    Fibroid, uterine    dx a few months ago   GERD (gastroesophageal reflux disease)    Gestational diabetes    Glucose intolerance (impaired glucose tolerance) 07/08/2023   Based on 2 hour postpartum GTT     H/O mitral valve prolapse    as a child   History of mitral valve prolapse 09/16/2016   Hypertension    Impaction, bowel (HCC)    Inappropriate sinus tachycardia (HCC)    a. exacerbated by pregnancy - 2018; b. 01/2017 Holter: 29% of time in sinus tachycardia;  c. 02/2017 Echo: EF 65-70%, no rwma; c. 01/2017 Holter: 29% of time in sinus tachycardia.   Kidney stones    Postpartum depression    Prediabetes 01/16/2022   Upper respiratory tract infection 05/27/2020     Objective: Physical Exam General: The patient is alert and oriented x3 in no acute distress.  Dermatology: Skin is warm, dry and supple bilateral lower extremities. Negative for open lesions or macerations bilateral.   Vascular: Dorsalis Pedis and Posterior Tibial pulses palpable bilateral.  Capillary fill time is immediate to all digits.  Neurological: Grossly intact via light touch  Musculoskeletal: Tenderness to palpation to the plantar aspect of the left heel along the plantar  fascia. All other joints range of motion within normal limits bilateral. Strength 5/5 in all groups bilateral.   Radiographic exam LT foot 05/22/2024: Normal osseous mineralization. Joint spaces preserved. No fracture/dislocation/boney destruction. No other soft tissue abnormalities or radiopaque foreign bodies.   Assessment: 1. Plantar fasciitis left foot  Plan of Care:  -Patient evaluated. Xrays reviewed.   -Injection of 0.5cc Celestone  soluspan injected into the left plantar fascia.  -No NSAIDs -Declined Medrol Dosepak -Recommend more holistic anti-inflammatory such as turmeric and ice especially at the end of the day -Continue wearing good supportive tennis shoes and sneakers.  Refrain from going barefoot.  Recommend OOFOS recovery slides available at [employer] running store -The patient would benefit from custom orthotics to support the medial longitudinal arch of the foot and potentially alleviate a lot of pressure and pain from her fascia.  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 Thresa EMERSON Sar, DPM    [address] [place] [address], [place] [address]  Office [phone]  Fax [phone]

## 2024-05-23 LAB — CLOSTRIDIUM DIFFICILE BY PCR: Toxigenic C. Difficile by PCR: NEGATIVE

## 2024-05-23 LAB — SPECIMEN STATUS REPORT

## 2024-05-24 LAB — GASTROINTESTINAL PATHOGEN PNL
CampyloBacter Group: NOT DETECTED
Norovirus GI/GII: NOT DETECTED
Rotavirus A: NOT DETECTED
Salmonella species: NOT DETECTED
Shiga Toxin 1: NOT DETECTED
Shiga Toxin 2: NOT DETECTED
Shigella Species: NOT DETECTED
Vibrio Group: NOT DETECTED
Yersinia enterocolitica: NOT DETECTED

## 2024-05-24 LAB — H. PYLORI ANTIGEN, STOOL: H pylori Ag, Stl: NEGATIVE

## 2024-05-26 LAB — CALPROTECTIN, FECAL: Calprotectin, Fecal: 28 ug/g (ref 0–120)

## 2024-05-28 ENCOUNTER — Telehealth: Payer: Self-pay | Admitting: Gastroenterology

## 2024-05-28 NOTE — Telephone Encounter (Signed)
 Inbound call from pt requesting to see if Camie is willing to do a Virtual appointment due to her child being sick and not wanting to come in and get anyone sick. Please advise.

## 2024-05-28 NOTE — Telephone Encounter (Signed)
 Patient has been rescheduled for 10/24. Requesting a call if she needs to have any labs or test completed in the mean time. Please advise, thank you

## 2024-05-28 NOTE — Progress Notes (Signed)
 Noted pt has appt tomorrow 9/5

## 2024-05-28 NOTE — Telephone Encounter (Signed)
The pt has been advised 

## 2024-05-28 NOTE — Telephone Encounter (Signed)
 Nothing needed prior to the appt

## 2024-05-28 NOTE — Telephone Encounter (Signed)
 The pt has been advised that we do not offer virtual visits.  She will call back if she can not make the appt as planned

## 2024-05-29 ENCOUNTER — Ambulatory Visit: Admitting: Gastroenterology

## 2024-06-09 ENCOUNTER — Ambulatory Visit: Admitting: Physical Therapy

## 2024-06-26 ENCOUNTER — Ambulatory Visit: Admitting: Podiatry

## 2024-07-10 ENCOUNTER — Ambulatory Visit (INDEPENDENT_AMBULATORY_CARE_PROVIDER_SITE_OTHER): Admitting: Nurse Practitioner

## 2024-07-10 ENCOUNTER — Ambulatory Visit: Admitting: Podiatry

## 2024-07-10 ENCOUNTER — Encounter: Payer: Self-pay | Admitting: Nurse Practitioner

## 2024-07-10 DIAGNOSIS — R7303 Prediabetes: Secondary | ICD-10-CM

## 2024-07-10 DIAGNOSIS — F418 Other specified anxiety disorders: Secondary | ICD-10-CM

## 2024-07-10 DIAGNOSIS — Z13220 Encounter for screening for lipoid disorders: Secondary | ICD-10-CM | POA: Diagnosis not present

## 2024-07-10 DIAGNOSIS — Z1231 Encounter for screening mammogram for malignant neoplasm of breast: Secondary | ICD-10-CM | POA: Diagnosis not present

## 2024-07-10 DIAGNOSIS — J4540 Moderate persistent asthma, uncomplicated: Secondary | ICD-10-CM | POA: Diagnosis not present

## 2024-07-10 DIAGNOSIS — O99345 Other mental disorders complicating the puerperium: Secondary | ICD-10-CM | POA: Insufficient documentation

## 2024-07-10 LAB — LIPID PANEL
Cholesterol: 214 mg/dL — ABNORMAL HIGH (ref 0–200)
HDL: 57.7 mg/dL (ref >39.00)
LDL Cholesterol: 128 mg/dL — ABNORMAL HIGH (ref 0–99)
NonHDL: 156.3
Total CHOL/HDL Ratio: 4
Triglycerides: 143 mg/dL (ref 0.0–149.0)
VLDL: 28.6 mg/dL (ref 0.0–40.0)

## 2024-07-10 LAB — HEMOGLOBIN A1C: Hgb A1c MFr Bld: 6.2 % (ref 4.6–6.5)

## 2024-07-10 LAB — TSH: TSH: 2.68 u[IU]/mL (ref 0.35–5.50)

## 2024-07-10 MED ORDER — IPRATROPIUM-ALBUTEROL 0.5-2.5 (3) MG/3ML IN SOLN: 3.0000 mL | Freq: Four times a day (QID) | RESPIRATORY_TRACT | 0 refills | Status: AC | PRN

## 2024-07-10 MED ORDER — WELLBUTRIN SR 150 MG PO TB12: 150.0000 mg | ORAL_TABLET | Freq: Two times a day (BID) | ORAL | 11 refills | Status: AC

## 2024-07-10 NOTE — Assessment & Plan Note (Signed)
 History of using albuterol  2-3 times a week and wheezing at nighttime.  Patient like to hold off on ICS currently.  Will refill patient's nebulizer solution and we did administer nebulizer in office through DME as patient wheezes when she gets sick.  Patient mentions she was on controller medications as a kid with asthma

## 2024-07-10 NOTE — Assessment & Plan Note (Signed)
 Maintained on Wellbutrin  150 mg twice daily.  Tolerates it well.  Did search up-to-date stating that this was acceptable to use during breast-feeding.  Recently prescribed by her GYN.  She denies HI/SI/AVH

## 2024-07-10 NOTE — Assessment & Plan Note (Signed)
 Pending labs inclusive of A1c and lipid panel.  Continue working lifestyle modifications

## 2024-07-10 NOTE — Patient Instructions (Signed)
 Nice to see you toyda  I will be in touch with the labs once I have them  Follow up with me in 3 months, sooner If you need me  Zepbound and Wegovy are the 2 weight loss medications  Call and scheudle mammogram at   [facility] [address] ( on hospital grounds) [place], [place]  [phone]

## 2024-07-10 NOTE — Progress Notes (Signed)
 New Patient Office Visit  Subjective    Patient ID: Jane Jackson, female    DOB: 28-Feb-1986  Age: 38 y.o. MRN: [id number]  CC:  Chief Complaint  Patient presents with   Establish Care    Pt complains of having concerns with obesity and asthma.    Medication Management    Wellbutrin      HPI Jane Jackson presents to establish care   Mood: postpartum anxiety. She is currenlty on wellbutrin  150mg  bid. She has tried hydroxyzine  but does not help and causes her sleepy.   Asthma: currently on albuterol . She uses it around animals consistently. She is using it 2-3 times a week. Wheezing at night.   Obeisty: states that she will work out 7 days a aweek and she will do clean eating and lose weight but it is not sustainable States that she has seen dietiatina and can do approprate meal planning.  Statse that currenlty she is not doing a lot of workouts. States that on the weekend they will go out and do something active. She does have a history of gestational diabetes and family history of diabetes  Tdap: 2018 Flu: refused    Pap smear: up to date Mammogram: needs updating [facility]    Outpatient Encounter Medications as of 07/10/2024  Medication Sig   albuterol  (VENTOLIN  HFA) 108 (90 Base) MCG/ACT inhaler Inhale 1 puff into the lungs every 6 (six) hours as needed for wheezing or shortness of breath.   hydrOXYzine  (ATARAX ) 25 MG tablet Take 1 tablet (25 mg total) by mouth every 6 (six) hours as needed for anxiety (use for panic attacks).   ipratropium-albuterol  (DUONEB) 0.5-2.5 (3) MG/3ML SOLN Take 3 mLs by nebulization every 6 (six) hours as needed.   LORazepam  (ATIVAN ) 0.5 MG tablet Take 1 tablet (0.5 mg total) by mouth 2 (two) times daily as needed for anxiety (Use only if hydroxyzine  is not helping panic attack).   Prenatal Vit-Fe Fumarate-FA (PRENATAL MULTIVITAMIN) TABS tablet Take 1 tablet by mouth daily at 12 noon.   [DISCONTINUED] WELLBUTRIN  SR 150 MG 12 hr tablet Take 1  tablet (150 mg total) by mouth 2 (two) times daily.   WELLBUTRIN  SR 150 MG 12 hr tablet Take 1 tablet (150 mg total) by mouth 2 (two) times daily.   [DISCONTINUED] furosemide  (LASIX ) 20 MG tablet TAKE 2 TABLETS (40 MG TOTAL) BY MOUTH DAILY FOR 5 DAYS. TAKE 1 TABLET BY MOUTH DAILY FOR 5 DAYS. (Patient not taking: Reported on 04/17/2024)   No facility-administered encounter medications on file as of 07/10/2024.    Past Medical History:  Diagnosis Date   Anxiety    Asthma    Chicken pox    Complication of anesthesia    issue with asthma and intubation   Cystic dysplasia of one kidney    about 4 months ago-no f/u- largest 5mm?   Diverticulitis    Family history of cystic fibrosis    Normal CF screen   Family history of mental retardation    Fibroid, uterine    dx a few months ago   GERD (gastroesophageal reflux disease)    Gestational diabetes    Glucose intolerance (impaired glucose tolerance) 07/08/2023   Based on 2 hour postpartum GTT     H/O mitral valve prolapse    as a child   History of mitral valve prolapse 09/16/2016   Hypertension    Impaction, bowel (HCC)    Inappropriate sinus tachycardia    a. exacerbated by pregnancy -  2018; b. 01/2017 Holter: 29% of time in sinus tachycardia;  c. 02/2017 Echo: EF 65-70%, no rwma; c. 01/2017 Holter: 29% of time in sinus tachycardia.   Kidney stones    Postpartum depression    Prediabetes 01/16/2022   Upper respiratory tract infection 05/27/2020    Past Surgical History:  Procedure Laterality Date   APPENDECTOMY     CHOLECYSTECTOMY N/A 01/18/2016   Procedure: LAPAROSCOPIC CHOLECYSTECTOMY WITH INTRAOPERATIVE CHOLANGIOGRAM;  Surgeon: Morene Olives, MD;  Location: [facility];  Service: General;  Laterality: N/A;   GALLBLADDER SURGERY  2017   ruptured cyst     TONSILLECTOMY AND ADENOIDECTOMY     age 52   TYMPANOSTOMY TUBE PLACEMENT      Family History  Problem Relation Age of Onset   Arthritis Mother    Hypertension Mother     Diabetes Mother    Cervical cancer Mother    Cancer Father        lugn cancer/ smoker   Alcohol abuse Father    Arthritis Father    Hypertension Father    Thyroid  disease Maternal Aunt        thyroid  cancer   Arthritis Maternal Grandmother    Heart disease Maternal Grandmother    Stroke Maternal Grandmother    Hypertension Maternal Grandmother    Diabetes Maternal Grandmother    Arthritis Maternal Grandfather    Colon cancer Maternal Grandfather    Prostate cancer Maternal Grandfather    Heart disease Maternal Grandfather    Stroke Maternal Grandfather    Hypertension Maternal Grandfather    Arthritis Paternal Grandmother    Heart disease Paternal Grandmother    Hypertension Paternal Grandmother    Diabetes Paternal Grandmother    Arthritis Paternal Grandfather    Heart disease Paternal Grandfather    Hypertension Paternal Grandfather     Social History   Socioeconomic History   Marital status: Married    Spouse name: Elspeth   Number of children: 3   Years of education: Not on file   Highest education level: Not on file  Occupational History   Not on file  Tobacco Use   Smoking status: Former    Current packs/day: 0.00    Types: Cigarettes    Quit date: 03/03/2008    Years since quitting: 16.3   Smokeless tobacco: Never   Tobacco comments:    Smoked socially and on occasion   Vaping Use   Vaping status: Never Used  Substance and Sexual Activity   Alcohol use: Not Currently    Comment: last use 10/28/2021 one drink   Drug use: No   Sexual activity: Yes    Partners: Male    Birth control/protection: None    Comment: hx ocp, depo, condom  Other Topics Concern   Not on file  Social History Narrative   Fulltime: Academic librarian expect for paramedic       [place] (7)   [place] (2)   Adeline (2 months) breast feeding currently    Social Drivers of Corporate investment banker Strain: Not on file  Food Insecurity: No Food  Insecurity (12/11/2023)   Hunger Vital Sign    Worried About Running Out of Food in the Last Year: Never true    Ran Out of Food in the Last Year: Never true  Transportation Needs: No Transportation Needs (12/11/2023)   PRAPARE - Transportation    Lack of Transportation (Medical): No    Lack of  Transportation (Non-Medical): No  Physical Activity: Not on file  Stress: Not on file  Social Connections: Patient Declined (12/11/2023)   Social Connection and Isolation Panel    Frequency of Communication with Friends and Family: Patient declined    Frequency of Social Gatherings with Friends and Family: Patient declined    Attends Religious Services: Patient declined    Database administrator or Organizations: Patient declined    Attends Banker Meetings: Patient declined    Marital Status: Patient declined  Intimate Partner Violence: Not At Risk (12/11/2023)   Humiliation, Afraid, Rape, and Kick questionnaire    Fear of Current or Ex-Partner: No    Emotionally Abused: No    Physically Abused: No    Sexually Abused: No    Review of Systems  Constitutional:  Negative for chills and fever.  Respiratory:  Negative for shortness of breath.   Cardiovascular:  Negative for chest pain and leg swelling.  Gastrointestinal:  Positive for diarrhea. Negative for abdominal pain, blood in stool, constipation, nausea and vomiting.  Genitourinary:  Negative for dysuria and hematuria.  Neurological:  Negative for tingling and headaches.  Psychiatric/Behavioral:  Negative for hallucinations and suicidal ideas.         Objective    BP 124/82   Pulse 90   Temp 98.1 F (36.7 C) (Oral)   Ht 5' 4 (1.626 m)   Wt (!) 321 lb 9.6 oz (145.9 kg)   SpO2 97%   BMI 55.20 kg/m   Physical Exam Vitals and nursing note reviewed.  Constitutional:      Appearance: Normal appearance.  HENT:     Right Ear: Tympanic membrane, ear canal and external ear normal.     Left Ear: Tympanic membrane, ear  canal and external ear normal.     Mouth/Throat:     Mouth: Mucous membranes are moist.     Pharynx: Oropharynx is clear.  Eyes:     Extraocular Movements: Extraocular movements intact.     Pupils: Pupils are equal, round, and reactive to light.  Cardiovascular:     Rate and Rhythm: Normal rate and regular rhythm.     Heart sounds: Normal heart sounds.  Pulmonary:     Effort: Pulmonary effort is normal.     Breath sounds: Normal breath sounds.  Neurological:     Mental Status: She is alert.         Assessment & Plan:   Problem List Items Addressed This Visit       Respiratory   Uncomplicated asthma   History of using albuterol  2-3 times a week and wheezing at nighttime.  Patient like to hold off on ICS currently.  Will refill patient's nebulizer solution and we did administer nebulizer in office through DME as patient wheezes when she gets sick.  Patient mentions she was on controller medications as a kid with asthma      Relevant Medications   ipratropium-albuterol  (DUONEB) 0.5-2.5 (3) MG/3ML SOLN     Other   Morbid obesity (HCC) - Primary   Pending labs inclusive of A1c and lipid panel.  Continue working lifestyle modifications      Relevant Orders   TSH   Lipid panel   Prediabetes   History of the same.  Last A1c was 6.4%.  Pending A1c today.  We did discuss possible weight loss injections inclusive of Zepbound or Wegovy she will reach out to her insurance to see if these are covered.  Relevant Orders   Hemoglobin A1c   Postpartum anxiety   Maintained on Wellbutrin  150 mg twice daily.  Tolerates it well.  Did search up-to-date stating that this was acceptable to use during breast-feeding.  Recently prescribed by her GYN.  She denies HI/SI/AVH      Relevant Medications   WELLBUTRIN  SR 150 MG 12 hr tablet   Other Relevant Orders   TSH   Other Visit Diagnoses       Screening mammogram for breast cancer       Relevant Orders   MM 3D SCREENING MAMMOGRAM  BILATERAL BREAST     Screening for lipid disorders       Relevant Orders   Lipid panel       Return in about 3 months (around 10/10/2024) for weight.   Adina Crandall, NP

## 2024-07-10 NOTE — Assessment & Plan Note (Signed)
 History of the same.  Last A1c was 6.4%.  Pending A1c today.  We did discuss possible weight loss injections inclusive of Zepbound or Wegovy she will reach out to her insurance to see if these are covered.

## 2024-07-14 ENCOUNTER — Ambulatory Visit: Payer: Self-pay | Admitting: Nurse Practitioner

## 2024-07-17 ENCOUNTER — Ambulatory Visit: Admitting: Gastroenterology

## 2024-07-17 NOTE — Progress Notes (Deleted)
 Jane Jackson [id number] December 26, 1985   Chief Complaint:  Referring Provider: No ref. provider found Primary GI MD: Dr. Legrand  HPI: Jane Jackson is a 38 y.o. female with past medical history of anxiety and depression, asthma, anesthesia complication (?), diverticulitis, GERD, HTN, MVP, bowel impaction, kidney stones, gestational diabetes, appendectomy, cholecystectomy 2017 who presents today for a complaint of *** .    Seen in our office 01/17/2016 by Dr. Legrand for complaint of RUQ abdominal pain, nausea, vomiting, and stool impaction.  Had recent hospital stay at [facility] and [facility] with extensive workup.  There was questionable inflammation in segment of the right colon on initial CT scan at [facility] none on the CT scan done at [facility].  Original ultrasound showed no gallstone, there was a gallstone on the subsequent CT scan.  EGD was normal.  MRCP was normal.  No HIDA scan done.  See note for more details.  Primary concern at that visit was that patient was at risk for volume depletion and she was admitted for observation and IV fluids, with surgical consult to see if a diagnostic laparoscopy would be indicated.  She did end up having laparoscopic cholecystectomy during that admission.   Last seen in office 04/16/2024 for diarrhea, abdominal pain, rectal bleeding.  She was 4 months postpartum at that time and reported having pregnancy and delivery complicated by preeclampsia as well as endometritis.  Had been on IV antibiotics as well as a course of oral antibiotics after discharge.  Subsequently developed diarrhea and abdominal cramping.  Occasional BRBPR which she attributed to hemorrhoids.  Family history of colon cancer in her maternal grandfather, though unclear whether this was his primary cancer. She also endorsed some dull epigastric pain worse with NSAIDs and on an empty stomach, relieved by Pepcid .  Labs and stool studies ordered.  She was advised to increase dietary fiber.  If above workup negative  and diarrhea persist or worsen, consider colonoscopy.  Could also consider EGD for persistent or worsening epigastric pain.  Labs showed mild iron deficiency but otherwise normal. Fecal calprotectin normal. Negative H. pylori. Negative GI pathogen panel Negative C. difficile   Discussed the use of AI scribe software for clinical note transcription with the patient, who gave verbal consent to proceed.  History of Present Illness       Previous GI Procedures/Imaging   CT A/P 02/01/2016 Status post cholecystectomy. No postoperative fluid collection is identified. The area of clinical concern anteriorly shows no definitive abnormality.   Diverticulosis without diverticulitis.   Uterine fibroid change and ovarian cystic change.   Past Medical History:  Diagnosis Date   Anxiety    Asthma    Chicken pox    Complication of anesthesia    issue with asthma and intubation   Cystic dysplasia of one kidney    about 4 months ago-no f/u- largest 5mm?   Diverticulitis    Family history of cystic fibrosis    Normal CF screen   Family history of mental retardation    Fibroid, uterine    dx a few months ago   GERD (gastroesophageal reflux disease)    Gestational diabetes    Glucose intolerance (impaired glucose tolerance) 07/08/2023   Based on 2 hour postpartum GTT     H/O mitral valve prolapse    as a child   History of mitral valve prolapse 09/16/2016   Hypertension    Impaction, bowel (HCC)    Inappropriate sinus tachycardia    a. exacerbated by pregnancy -  2018; b. 01/2017 Holter: 29% of time in sinus tachycardia;  c. 02/2017 Echo: EF 65-70%, no rwma; c. 01/2017 Holter: 29% of time in sinus tachycardia.   Kidney stones    Postpartum depression    Prediabetes 01/16/2022   Upper respiratory tract infection 05/27/2020    Past Surgical History:  Procedure Laterality Date   APPENDECTOMY     CHOLECYSTECTOMY N/A 01/18/2016   Procedure: LAPAROSCOPIC CHOLECYSTECTOMY WITH  INTRAOPERATIVE CHOLANGIOGRAM;  Surgeon: Morene Olives, MD;  Location: [facility];  Service: General;  Laterality: N/A;   GALLBLADDER SURGERY  2017   ruptured cyst     TONSILLECTOMY AND ADENOIDECTOMY     age 1   TYMPANOSTOMY TUBE PLACEMENT      Current Outpatient Medications  Medication Sig Dispense Refill   albuterol  (VENTOLIN  HFA) 108 (90 Base) MCG/ACT inhaler Inhale 1 puff into the lungs every 6 (six) hours as needed for wheezing or shortness of breath.     hydrOXYzine  (ATARAX ) 25 MG tablet Take 1 tablet (25 mg total) by mouth every 6 (six) hours as needed for anxiety (use for panic attacks). 30 tablet 2   ipratropium-albuterol  (DUONEB) 0.5-2.5 (3) MG/3ML SOLN Take 3 mLs by nebulization every 6 (six) hours as needed. 360 mL 0   LORazepam  (ATIVAN ) 0.5 MG tablet Take 1 tablet (0.5 mg total) by mouth 2 (two) times daily as needed for anxiety (Use only if hydroxyzine  is not helping panic attack). 5 tablet 0   Prenatal Vit-Fe Fumarate-FA (PRENATAL MULTIVITAMIN) TABS tablet Take 1 tablet by mouth daily at 12 noon.     WELLBUTRIN  SR 150 MG 12 hr tablet Take 1 tablet (150 mg total) by mouth 2 (two) times daily. 60 tablet 11   No current facility-administered medications for this visit.    Allergies as of 07/17/2024 - Review Complete 07/10/2024  Allergen Reaction Noted   Latex Itching and Rash 10/25/2015   Nsaids Other (See Comments) 07/10/2024   Procardia  [nifedipine ] Other (See Comments) 12/15/2023    Family History  Problem Relation Age of Onset   Arthritis Mother    Hypertension Mother    Diabetes Mother    Cervical cancer Mother    Cancer Father        lugn cancer/ smoker   Alcohol abuse Father    Arthritis Father    Hypertension Father    Thyroid  disease Maternal Aunt        thyroid  cancer   Arthritis Maternal Grandmother    Heart disease Maternal Grandmother    Stroke Maternal Grandmother    Hypertension Maternal Grandmother    Diabetes Maternal Grandmother    Arthritis  Maternal Grandfather    Colon cancer Maternal Grandfather    Prostate cancer Maternal Grandfather    Heart disease Maternal Grandfather    Stroke Maternal Grandfather    Hypertension Maternal Grandfather    Arthritis Paternal Grandmother    Heart disease Paternal Grandmother    Hypertension Paternal Grandmother    Diabetes Paternal Grandmother    Arthritis Paternal Grandfather    Heart disease Paternal Grandfather    Hypertension Paternal Grandfather     Social History   Tobacco Use   Smoking status: Former    Current packs/day: 0.00    Types: Cigarettes    Quit date: 03/03/2008    Years since quitting: 16.3   Smokeless tobacco: Never   Tobacco comments:    Smoked socially and on occasion   Vaping Use   Vaping status: Never Used  Substance Use  Topics   Alcohol use: Not Currently    Comment: last use 10/28/2021 one drink   Drug use: No     Review of Systems:    Constitutional: No weight loss, fever, chills, weakness or fatigue Eyes: No change in vision Ears, Nose, Throat:  No change in hearing or congestion Skin: No rash or itching Cardiovascular: No chest pain, chest pressure or palpitations   Respiratory: No SOB or cough Gastrointestinal: See HPI and otherwise negative Genitourinary: No dysuria or change in urinary frequency Neurological: No headache, dizziness or syncope Musculoskeletal: No new muscle or joint pain Hematologic: No bleeding or bruising    Physical Exam:  Vital signs: There were no vitals taken for this visit.  Constitutional: NAD, Well developed, Well nourished, alert and cooperative Head:  Normocephalic and atraumatic.  Eyes: No scleral icterus. Conjunctiva pink. Mouth: No oral lesions. Respiratory: Respirations even and unlabored. Lungs clear to auscultation bilaterally.  No wheezes, crackles, or rhonchi.  Cardiovascular:  Regular rate and rhythm. No murmurs. No peripheral edema. Gastrointestinal:  Soft, nondistended, nontender. No rebound  or guarding. Normal bowel sounds. No appreciable masses or hepatomegaly. Rectal:  Not performed.  Neurologic:  Alert and oriented x4;  grossly normal neurologically.  Skin:   Dry and intact without significant lesions or rashes. Psychiatric: Oriented to person, place and time. Demonstrates good judgement and reason without abnormal affect or behaviors.   RELEVANT LABS AND IMAGING: CBC    Component Value Date/Time   WBC 9.9 04/16/2024 1144   RBC 4.85 04/16/2024 1144   HGB 14.5 04/16/2024 1144   HGB 11.4 12/20/2023 1145   HCT 43.2 04/16/2024 1144   HCT 35.6 12/20/2023 1145   PLT 313.0 04/16/2024 1144   PLT 311 12/20/2023 1145   MCV 89.0 04/16/2024 1144   MCV 90 12/20/2023 1145   MCH 28.7 12/20/2023 1145   MCH 29.5 12/17/2023 0553   MCHC 33.5 04/16/2024 1144   RDW 13.8 04/16/2024 1144   RDW 14.2 12/20/2023 1145   LYMPHSABS 2.3 04/16/2024 1144   LYMPHSABS 1.8 06/10/2023 1431   MONOABS 0.7 04/16/2024 1144   EOSABS 0.2 04/16/2024 1144   EOSABS 0.2 06/10/2023 1431   BASOSABS 0.0 04/16/2024 1144   BASOSABS 0.0 06/10/2023 1431    CMP     Component Value Date/Time   NA 139 04/16/2024 1144   NA 141 02/04/2024 0925   K 4.2 04/16/2024 1144   CL 103 04/16/2024 1144   CO2 27 04/16/2024 1144   GLUCOSE 95 04/16/2024 1144   BUN 15 04/16/2024 1144   BUN 16 02/04/2024 0925   CREATININE 0.80 04/16/2024 1144   CALCIUM  9.9 04/16/2024 1144   PROT 7.7 04/16/2024 1144   PROT 6.7 02/04/2024 0925   ALBUMIN 4.7 04/16/2024 1144   ALBUMIN 4.3 02/04/2024 0925   AST 16 04/16/2024 1144   ALT 25 04/16/2024 1144   ALKPHOS 107 04/16/2024 1144   BILITOT 0.5 04/16/2024 1144   BILITOT 0.2 02/04/2024 0925   GFRNONAA >60 12/17/2023 0553   GFRAA 99 06/06/2020 1014   Echocardiogram 12/16/2023 1. Left ventricular ejection fraction, by estimation, is 60 to 65% . The left ventricle has normal function. The left ventricle has no regional wall motion abnormalities. Left ventricular diastolic parameters  were normal. 2. Right ventricular systolic function is normal. The right ventricular size is normal.  3. The mitral valve is normal in structure. Trivial mitral valve regurgitation. No evidence of mitral stenosis.  4. The aortic valve is normal in structure. Aortic  valve regurgitation is not visualized. No aortic stenosis is present.  5. The inferior vena cava is normal in size with greater than 50% respiratory variability, suggesting right atrial pressure of 3 mmHg.  Assessment/Plan:       Jane Furbish, PA-C [facility] 07/17/2024, 8:28 AM  Patient Care Team: Wendee Lynwood HERO, NP as PCP - General (Nurse Practitioner) Sheena Pugh, DO as PCP - Cardiology (Cardiology) Darron Deatrice LABOR, MD as Consulting Physician (Cardiology)

## 2024-07-24 ENCOUNTER — Encounter: Payer: Self-pay | Admitting: Podiatry

## 2024-07-24 ENCOUNTER — Ambulatory Visit (INDEPENDENT_AMBULATORY_CARE_PROVIDER_SITE_OTHER): Admitting: Podiatry

## 2024-07-24 VITALS — Ht 64.0 in | Wt 321.6 lb

## 2024-07-24 DIAGNOSIS — M722 Plantar fascial fibromatosis: Secondary | ICD-10-CM

## 2024-07-24 NOTE — Progress Notes (Signed)
-   Left without being seen.  Felecia Shelling, DPM [facility]  Dr. Felecia Shelling, DPM    [address] [place], [place] [address]                Office [phone]  Fax [phone]

## 2024-07-28 ENCOUNTER — Ambulatory Visit: Admitting: Nurse Practitioner

## 2024-08-06 ENCOUNTER — Other Ambulatory Visit: Payer: Self-pay | Admitting: Nurse Practitioner

## 2024-08-06 DIAGNOSIS — O99345 Other mental disorders complicating the puerperium: Secondary | ICD-10-CM

## 2024-09-03 ENCOUNTER — Ambulatory Visit: Admitting: Gastroenterology

## 2024-10-06 NOTE — Progress Notes (Unsigned)
 "  Jane Jackson [id number] 1986/02/27   Chief Complaint:  Referring Provider: Wendee Lynwood HERO, NP Primary GI MD: Dr. Legrand  HPI: Jane Jackson is a 39 y.o. female with past medical history of anxiety and depression, asthma, anesthesia complication (?), diverticulitis, GERD, HTN, MVP, bowel impaction, kidney stones, gestational diabetes, appendectomy, cholecystectomy 2017 who presents today for a complaint of *** .    Seen in our office 01/17/2016 by Dr. Legrand for complaint of RUQ abdominal pain, nausea, vomiting, and stool impaction.  Had recent hospital stay at [facility] and [facility] with extensive workup.  There was questionable inflammation in segment of the right colon on initial CT scan at [facility], none on the CT scan done at [facility].  Original ultrasound showed no gallstone, there was a gallstone on the subsequent CT scan.  EGD was normal.  MRCP was normal.  No HIDA scan done.  See note for more details.  Primary concern at that visit was that patient was at risk for volume depletion and she was admitted for observation and IV fluids, with surgical consult to see if a diagnostic laparoscopy would be indicated.  She did end up having laparoscopic cholecystectomy during that admission.   Patient last seen in office 04/16/2024.  Was 4 months postpartum at that time, stated that pregnancy and delivery were complicated by preeclampsia and per discharge summary patient also had endometriosis.  She had been on antibiotics through IV as well as on a course of oral antibiotics after discharge.  Subsequently developed diarrhea and abdominal cramping.  Developed hemorrhoids with last pregnancy and had occasional, small amount of BRBPR, using witch hazel as needed.  Declined a rectal exam in the office.  Reported that her maternal grandfather had colon cancer, unclear if this was his primary cancer as he also had prostate cancer. Patient endorsed some dull epigastric pain worsened by taking NSAIDs and worse on an empty  stomach, relieved by Pepcid .  Labs and stool studies were ordered.  Patient advised to increase dietary fiber.  If workup negative and diarrhea were to persist or worsen, or if persistent rectal bleeding, plan was to consider colonoscopy for further evaluation.  Also consider EGD if persistent or worsening epigastric pain.  Labs 04/16/2024: Normal CBC, CMP, CRP, lipase, TSH.  Negative celiac serologies.  Low iron saturation ratio of 13.3% and ferritin of 14.2 with iron 52.  Patient advised to start iron supplementation.  Negative C. difficile. Negative H. pylori stool. Normal fecal calprotectin. Negative GI pathogen panel.   Needs repeat iron studies     Discussed the use of AI scribe software for clinical note transcription with the patient, who gave verbal consent to proceed.  History of Present Illness       Previous GI Procedures/Imaging   CT A/P 02/01/2016 Status post cholecystectomy. No postoperative fluid collection is identified. The area of clinical concern anteriorly shows no definitive abnormality.   Diverticulosis without diverticulitis.   Uterine fibroid change and ovarian cystic change.   Past Medical History:  Diagnosis Date   Anxiety    Asthma    Chicken pox    Complication of anesthesia    issue with asthma and intubation   Cystic dysplasia of one kidney    about 4 months ago-no f/u- largest 5mm?   Diverticulitis    Family history of cystic fibrosis    Normal CF screen   Family history of mental retardation    Fibroid, uterine    dx a few months ago  GERD (gastroesophageal reflux disease)    Gestational diabetes    Glucose intolerance (impaired glucose tolerance) 07/08/2023   Based on 2 hour postpartum GTT     H/O mitral valve prolapse    as a child   History of mitral valve prolapse 09/16/2016   Hypertension    Impaction, bowel (HCC)    Inappropriate sinus tachycardia    a. exacerbated by pregnancy - 2018; b. 01/2017 Holter: 29% of time in  sinus tachycardia;  c. 02/2017 Echo: EF 65-70%, no rwma; c. 01/2017 Holter: 29% of time in sinus tachycardia.   Kidney stones    Postpartum depression    Prediabetes 01/16/2022   Upper respiratory tract infection 05/27/2020    Past Surgical History:  Procedure Laterality Date   APPENDECTOMY     CHOLECYSTECTOMY N/A 01/18/2016   Procedure: LAPAROSCOPIC CHOLECYSTECTOMY WITH INTRAOPERATIVE CHOLANGIOGRAM;  Surgeon: Morene Olives, MD;  Location: [facility];  Service: General;  Laterality: N/A;   GALLBLADDER SURGERY  2017   ruptured cyst     TONSILLECTOMY AND ADENOIDECTOMY     age 67   TYMPANOSTOMY TUBE PLACEMENT      Current Outpatient Medications  Medication Sig Dispense Refill   albuterol  (VENTOLIN  HFA) 108 (90 Base) MCG/ACT inhaler Inhale 1 puff into the lungs every 6 (six) hours as needed for wheezing or shortness of breath.     hydrOXYzine  (ATARAX ) 25 MG tablet Take 1 tablet (25 mg total) by mouth every 6 (six) hours as needed for anxiety (use for panic attacks). 30 tablet 2   ipratropium-albuterol  (DUONEB) 0.5-2.5 (3) MG/3ML SOLN Take 3 mLs by nebulization every 6 (six) hours as needed. 360 mL 0   LORazepam  (ATIVAN ) 0.5 MG tablet Take 1 tablet (0.5 mg total) by mouth 2 (two) times daily as needed for anxiety (Use only if hydroxyzine  is not helping panic attack). 5 tablet 0   Prenatal Vit-Fe Fumarate-FA (PRENATAL MULTIVITAMIN) TABS tablet Take 1 tablet by mouth daily at 12 noon.     WELLBUTRIN  SR 150 MG 12 hr tablet Take 1 tablet (150 mg total) by mouth 2 (two) times daily. 60 tablet 11   No current facility-administered medications for this visit.    Allergies as of 10/09/2024 - Review Complete 07/24/2024  Allergen Reaction Noted   Latex Itching and Rash 10/25/2015   Nsaids Other (See Comments) 07/10/2024   Procardia  [nifedipine ] Other (See Comments) 12/15/2023    Family History  Problem Relation Age of Onset   Arthritis Mother    Hypertension Mother    Diabetes Mother     Cervical cancer Mother    Cancer Father        lugn cancer/ smoker   Alcohol abuse Father    Arthritis Father    Hypertension Father    Thyroid  disease Maternal Aunt        thyroid  cancer   Arthritis Maternal Grandmother    Heart disease Maternal Grandmother    Stroke Maternal Grandmother    Hypertension Maternal Grandmother    Diabetes Maternal Grandmother    Arthritis Maternal Grandfather    Colon cancer Maternal Grandfather    Prostate cancer Maternal Grandfather    Heart disease Maternal Grandfather    Stroke Maternal Grandfather    Hypertension Maternal Grandfather    Arthritis Paternal Grandmother    Heart disease Paternal Grandmother    Hypertension Paternal Grandmother    Diabetes Paternal Grandmother    Arthritis Paternal Grandfather    Heart disease Paternal Grandfather    Hypertension  Paternal Grandfather     Social History[1]   Review of Systems:    Constitutional: No weight loss, fever, chills, weakness or fatigue Eyes: No change in vision Ears, Nose, Throat:  No change in hearing or congestion Skin: No rash or itching Cardiovascular: No chest pain, chest pressure or palpitations   Respiratory: No SOB or cough Gastrointestinal: See HPI and otherwise negative Genitourinary: No dysuria or change in urinary frequency Neurological: No headache, dizziness or syncope Musculoskeletal: No new muscle or joint pain Hematologic: No bleeding or bruising    Physical Exam:  Vital signs: There were no vitals taken for this visit.  Wt Readings from Last 3 Encounters:  07/24/24 (!) 321 lb 9.6 oz (145.9 kg)  07/10/24 (!) 321 lb 9.6 oz (145.9 kg)  04/17/24 (!) 327 lb (148.3 kg)     Constitutional: NAD, Well developed, Well nourished, alert and cooperative Head:  Normocephalic and atraumatic.  Eyes: No scleral icterus. Conjunctiva pink. Mouth: No oral lesions. Respiratory: Respirations even and unlabored. Lungs clear to auscultation bilaterally.  No wheezes,  crackles, or rhonchi.  Cardiovascular:  Regular rate and rhythm. No murmurs. No peripheral edema. Gastrointestinal:  Soft, nondistended, nontender. No rebound or guarding. Normal bowel sounds. No appreciable masses or hepatomegaly. Rectal:  Not performed.  Neurologic:  Alert and oriented x4;  grossly normal neurologically.  Skin:   Dry and intact without significant lesions or rashes. Psychiatric: Oriented to person, place and time. Demonstrates good judgement and reason without abnormal affect or behaviors.   Echocardiogram 12/16/2023 1. Left ventricular ejection fraction, by estimation, is 60 to 65% . The left ventricle has normal function. The left ventricle has no regional wall motion abnormalities. Left ventricular diastolic parameters were normal. 2. Right ventricular systolic function is normal. The right ventricular size is normal.  3. The mitral valve is normal in structure. Trivial mitral valve regurgitation. No evidence of mitral stenosis.  4. The aortic valve is normal in structure. Aortic valve regurgitation is not visualized. No aortic stenosis is present.  5. The inferior vena cava is normal in size with greater than 50% respiratory variability, suggesting right atrial pressure of 3 mmHg.   Assessment/Plan:   Assessment & Plan      Needs repeat iron studies   Camie Furbish, PA-C [facility] 10/06/2024, 11:41 AM  Patient Care Team: Wendee Lynwood HERO, NP as PCP - General (Nurse Practitioner) Sheena Pugh, DO as PCP - Cardiology (Cardiology) Darron Deatrice LABOR, MD as Consulting Physician (Cardiology)       [1]  Social History Tobacco Use   Smoking status: Former    Current packs/day: 0.00    Types: Cigarettes    Quit date: 03/03/2008    Years since quitting: 16.6   Smokeless tobacco: Never   Tobacco comments:    Smoked socially and on occasion   Vaping Use   Vaping status: Never Used  Substance Use Topics   Alcohol use: Not Currently    Comment: last  use 10/28/2021 one drink   Drug use: No   "

## 2024-10-09 ENCOUNTER — Telehealth: Payer: Self-pay | Admitting: Gastroenterology

## 2024-10-09 ENCOUNTER — Ambulatory Visit: Admitting: Gastroenterology

## 2024-10-09 NOTE — Telephone Encounter (Signed)
 Please call patient.  We last saw her in July and had checked her iron levels at that time.  She had evidence of iron deficiency and would recommended she start an iron supplement.  Her iron level should be repeated to see whether or not she needs to continue taking it.  She can have this done at our office, or she can have this done with her PCP/OB/GYN if she prefers.

## 2024-10-16 ENCOUNTER — Ambulatory Visit: Admitting: Nurse Practitioner

## 2024-11-24 ENCOUNTER — Ambulatory Visit: Admitting: Gastroenterology

## 2024-12-01 ENCOUNTER — Ambulatory Visit: Admitting: Nurse Practitioner
